# Patient Record
Sex: Female | Born: 1940 | Race: Black or African American | Hispanic: No | Marital: Married | Smoking: Never smoker
Health system: Southern US, Community
[De-identification: ages and names within clinical notes are randomized; demographics above are authoritative.]

## PROBLEM LIST (undated history)

## (undated) DIAGNOSIS — F419 Anxiety disorder, unspecified: Secondary | ICD-10-CM

## (undated) DIAGNOSIS — M25559 Pain in unspecified hip: Secondary | ICD-10-CM

## (undated) DIAGNOSIS — I872 Venous insufficiency (chronic) (peripheral): Secondary | ICD-10-CM

## (undated) DIAGNOSIS — G20 Parkinson's disease: Secondary | ICD-10-CM

## (undated) DIAGNOSIS — I739 Peripheral vascular disease, unspecified: Secondary | ICD-10-CM

## (undated) DIAGNOSIS — M1990 Unspecified osteoarthritis, unspecified site: Secondary | ICD-10-CM

## (undated) DIAGNOSIS — R9389 Abnormal findings on diagnostic imaging of other specified body structures: Secondary | ICD-10-CM

## (undated) DIAGNOSIS — E039 Hypothyroidism, unspecified: Secondary | ICD-10-CM

## (undated) DIAGNOSIS — M545 Low back pain, unspecified: Secondary | ICD-10-CM

## (undated) DIAGNOSIS — I10 Essential (primary) hypertension: Secondary | ICD-10-CM

## (undated) DIAGNOSIS — C50919 Malignant neoplasm of unspecified site of unspecified female breast: Secondary | ICD-10-CM

## (undated) DIAGNOSIS — M8580 Other specified disorders of bone density and structure, unspecified site: Secondary | ICD-10-CM

## (undated) DIAGNOSIS — E7800 Pure hypercholesterolemia, unspecified: Secondary | ICD-10-CM

## (undated) DIAGNOSIS — G20A1 Parkinson's disease without dyskinesia, without mention of fluctuations: Secondary | ICD-10-CM

## (undated) DIAGNOSIS — K5730 Diverticulosis of large intestine without perforation or abscess without bleeding: Secondary | ICD-10-CM

## (undated) HISTORY — DX: Essential (primary) hypertension: I10

## (undated) HISTORY — DX: Parkinson's disease: G20

## (undated) HISTORY — PX: POLYPECTOMY: SHX149

## (undated) HISTORY — DX: Hypothyroidism, unspecified: E03.9

## (undated) HISTORY — DX: Venous insufficiency (chronic) (peripheral): I87.2

## (undated) HISTORY — PX: OVARIAN CYST REMOVAL: SHX89

## (undated) HISTORY — DX: Other specified disorders of bone density and structure, unspecified site: M85.80

## (undated) HISTORY — DX: Pain in unspecified hip: M25.559

## (undated) HISTORY — DX: Unspecified osteoarthritis, unspecified site: M19.90

## (undated) HISTORY — PX: CHOLECYSTECTOMY: SHX55

## (undated) HISTORY — DX: Low back pain: M54.5

## (undated) HISTORY — DX: Anxiety disorder, unspecified: F41.9

## (undated) HISTORY — DX: Diverticulosis of large intestine without perforation or abscess without bleeding: K57.30

## (undated) HISTORY — DX: Peripheral vascular disease, unspecified: I73.9

## (undated) HISTORY — PX: COLONOSCOPY: SHX174

## (undated) HISTORY — DX: Low back pain, unspecified: M54.50

## (undated) HISTORY — DX: Parkinson's disease without dyskinesia, without mention of fluctuations: G20.A1

## (undated) HISTORY — DX: Abnormal findings on diagnostic imaging of other specified body structures: R93.89

## (undated) HISTORY — DX: Malignant neoplasm of unspecified site of unspecified female breast: C50.919

## (undated) HISTORY — DX: Pure hypercholesterolemia, unspecified: E78.00

## (undated) HISTORY — PX: BREAST LUMPECTOMY: SHX2

---

## 1997-10-11 ENCOUNTER — Other Ambulatory Visit: Admission: RE | Admit: 1997-10-11 | Discharge: 1997-10-11 | Payer: Self-pay | Admitting: Obstetrics & Gynecology

## 1997-11-15 ENCOUNTER — Ambulatory Visit (HOSPITAL_COMMUNITY): Admission: RE | Admit: 1997-11-15 | Discharge: 1997-11-15 | Payer: Self-pay | Admitting: Obstetrics & Gynecology

## 1998-02-03 ENCOUNTER — Other Ambulatory Visit: Admission: RE | Admit: 1998-02-03 | Discharge: 1998-02-03 | Payer: Self-pay | Admitting: Obstetrics & Gynecology

## 1998-12-06 ENCOUNTER — Other Ambulatory Visit: Admission: RE | Admit: 1998-12-06 | Discharge: 1998-12-06 | Payer: Self-pay | Admitting: Obstetrics and Gynecology

## 1998-12-09 ENCOUNTER — Ambulatory Visit (HOSPITAL_COMMUNITY): Admission: RE | Admit: 1998-12-09 | Discharge: 1998-12-09 | Payer: Self-pay | Admitting: Obstetrics and Gynecology

## 1998-12-09 ENCOUNTER — Encounter: Payer: Self-pay | Admitting: Obstetrics and Gynecology

## 1999-03-09 ENCOUNTER — Encounter: Payer: Self-pay | Admitting: Obstetrics & Gynecology

## 1999-03-09 ENCOUNTER — Ambulatory Visit (HOSPITAL_COMMUNITY): Admission: RE | Admit: 1999-03-09 | Discharge: 1999-03-09 | Payer: Self-pay | Admitting: Obstetrics & Gynecology

## 1999-03-16 ENCOUNTER — Encounter: Payer: Self-pay | Admitting: Family Medicine

## 1999-03-16 ENCOUNTER — Ambulatory Visit (HOSPITAL_COMMUNITY): Admission: RE | Admit: 1999-03-16 | Discharge: 1999-03-16 | Payer: Self-pay | Admitting: Family Medicine

## 1999-03-30 ENCOUNTER — Encounter: Payer: Self-pay | Admitting: Family Medicine

## 1999-03-30 ENCOUNTER — Ambulatory Visit (HOSPITAL_COMMUNITY): Admission: RE | Admit: 1999-03-30 | Discharge: 1999-03-30 | Payer: Self-pay | Admitting: Family Medicine

## 1999-04-13 ENCOUNTER — Encounter: Payer: Self-pay | Admitting: Family Medicine

## 1999-04-13 ENCOUNTER — Ambulatory Visit (HOSPITAL_COMMUNITY): Admission: RE | Admit: 1999-04-13 | Discharge: 1999-04-13 | Payer: Self-pay | Admitting: Family Medicine

## 1999-05-24 ENCOUNTER — Encounter: Admission: RE | Admit: 1999-05-24 | Discharge: 1999-05-24 | Payer: Self-pay | Admitting: Neurosurgery

## 1999-05-24 ENCOUNTER — Encounter: Payer: Self-pay | Admitting: Neurosurgery

## 1999-08-02 ENCOUNTER — Encounter: Admission: RE | Admit: 1999-08-02 | Discharge: 1999-09-25 | Payer: Self-pay | Admitting: Neurological Surgery

## 1999-10-04 ENCOUNTER — Encounter (INDEPENDENT_AMBULATORY_CARE_PROVIDER_SITE_OTHER): Payer: Self-pay | Admitting: Specialist

## 1999-10-04 ENCOUNTER — Other Ambulatory Visit: Admission: RE | Admit: 1999-10-04 | Discharge: 1999-10-04 | Payer: Self-pay | Admitting: Gastroenterology

## 2000-02-19 ENCOUNTER — Other Ambulatory Visit: Admission: RE | Admit: 2000-02-19 | Discharge: 2000-02-19 | Payer: Self-pay | Admitting: Obstetrics & Gynecology

## 2001-01-29 ENCOUNTER — Other Ambulatory Visit: Admission: RE | Admit: 2001-01-29 | Discharge: 2001-01-29 | Payer: Self-pay | Admitting: Obstetrics and Gynecology

## 2001-02-18 ENCOUNTER — Encounter: Admission: RE | Admit: 2001-02-18 | Discharge: 2001-03-10 | Payer: Self-pay | Admitting: Neurology

## 2002-01-29 ENCOUNTER — Other Ambulatory Visit: Admission: RE | Admit: 2002-01-29 | Discharge: 2002-01-29 | Payer: Self-pay | Admitting: Obstetrics and Gynecology

## 2002-11-30 ENCOUNTER — Encounter (HOSPITAL_COMMUNITY): Admission: RE | Admit: 2002-11-30 | Discharge: 2003-02-28 | Payer: Self-pay | Admitting: Internal Medicine

## 2002-12-01 ENCOUNTER — Encounter: Payer: Self-pay | Admitting: Internal Medicine

## 2003-02-16 ENCOUNTER — Ambulatory Visit (HOSPITAL_COMMUNITY): Admission: RE | Admit: 2003-02-16 | Discharge: 2003-02-16 | Payer: Self-pay | Admitting: Obstetrics and Gynecology

## 2003-02-16 ENCOUNTER — Encounter: Payer: Self-pay | Admitting: Obstetrics and Gynecology

## 2003-06-14 ENCOUNTER — Ambulatory Visit (HOSPITAL_COMMUNITY): Admission: RE | Admit: 2003-06-14 | Discharge: 2003-06-14 | Payer: Self-pay | Admitting: Pulmonary Disease

## 2003-09-24 HISTORY — PX: OTHER SURGICAL HISTORY: SHX169

## 2003-10-14 ENCOUNTER — Encounter (INDEPENDENT_AMBULATORY_CARE_PROVIDER_SITE_OTHER): Payer: Self-pay | Admitting: Specialist

## 2003-10-14 ENCOUNTER — Observation Stay (HOSPITAL_COMMUNITY): Admission: RE | Admit: 2003-10-14 | Discharge: 2003-10-15 | Payer: Self-pay | Admitting: General Surgery

## 2004-02-17 ENCOUNTER — Encounter (HOSPITAL_COMMUNITY): Admission: RE | Admit: 2004-02-17 | Discharge: 2004-05-17 | Payer: Self-pay | Admitting: Internal Medicine

## 2004-05-01 ENCOUNTER — Other Ambulatory Visit: Admission: RE | Admit: 2004-05-01 | Discharge: 2004-05-01 | Payer: Self-pay | Admitting: Obstetrics and Gynecology

## 2004-07-14 ENCOUNTER — Ambulatory Visit: Payer: Self-pay | Admitting: Pulmonary Disease

## 2004-09-06 ENCOUNTER — Ambulatory Visit: Payer: Self-pay | Admitting: Pulmonary Disease

## 2004-12-28 ENCOUNTER — Ambulatory Visit: Payer: Self-pay | Admitting: Pulmonary Disease

## 2005-01-05 ENCOUNTER — Ambulatory Visit: Payer: Self-pay | Admitting: Gastroenterology

## 2005-01-29 ENCOUNTER — Ambulatory Visit: Payer: Self-pay | Admitting: Gastroenterology

## 2005-04-18 ENCOUNTER — Ambulatory Visit: Payer: Self-pay | Admitting: Pulmonary Disease

## 2005-06-01 ENCOUNTER — Ambulatory Visit: Payer: Self-pay | Admitting: Pulmonary Disease

## 2005-08-02 ENCOUNTER — Other Ambulatory Visit: Admission: RE | Admit: 2005-08-02 | Discharge: 2005-08-02 | Payer: Self-pay | Admitting: Obstetrics and Gynecology

## 2005-09-26 ENCOUNTER — Encounter: Admission: RE | Admit: 2005-09-26 | Discharge: 2005-09-26 | Payer: Self-pay | Admitting: Orthopedic Surgery

## 2005-10-30 ENCOUNTER — Ambulatory Visit: Payer: Self-pay | Admitting: Pulmonary Disease

## 2005-11-23 HISTORY — PX: OTHER SURGICAL HISTORY: SHX169

## 2005-12-05 ENCOUNTER — Inpatient Hospital Stay (HOSPITAL_COMMUNITY): Admission: RE | Admit: 2005-12-05 | Discharge: 2005-12-07 | Payer: Self-pay | Admitting: Orthopedic Surgery

## 2006-05-02 ENCOUNTER — Ambulatory Visit: Payer: Self-pay | Admitting: Pulmonary Disease

## 2006-10-28 ENCOUNTER — Ambulatory Visit: Payer: Self-pay | Admitting: Pulmonary Disease

## 2006-10-28 LAB — CONVERTED CEMR LAB
ALT: 5 units/L (ref 0–40)
Albumin: 4.1 g/dL (ref 3.5–5.2)
Alkaline Phosphatase: 62 units/L (ref 39–117)
BUN: 15 mg/dL (ref 6–23)
Bilirubin, Direct: 0.2 mg/dL (ref 0.0–0.3)
Direct LDL: 129.6 mg/dL
Eosinophils Absolute: 0 10*3/uL (ref 0.0–0.6)
GFR calc Af Amer: 92 mL/min
GFR calc non Af Amer: 76 mL/min
HDL: 57.7 mg/dL (ref >39.0)
Hemoglobin: 13.2 g/dL (ref 12.0–15.0)
Hgb A1c MFr Bld: 5.7 % (ref 4.6–6.0)
MCHC: 34.1 g/dL (ref 30.0–36.0)
Monocytes Absolute: 0.2 10*3/uL (ref 0.2–0.7)
Neutro Abs: 1.9 10*3/uL (ref 1.4–7.7)
Potassium: 4.5 meq/L (ref 3.5–5.1)
RBC: 4.35 M/uL (ref 3.87–5.11)
RDW: 12.8 % (ref 11.5–14.6)
Sodium: 140 meq/L (ref 135–145)
Total Protein: 6.7 g/dL (ref 6.0–8.3)
Triglycerides: 66 mg/dL (ref 0–149)
WBC: 3.5 10*3/uL — ABNORMAL LOW (ref 4.5–10.5)

## 2006-12-19 ENCOUNTER — Encounter: Payer: Self-pay | Admitting: Pulmonary Disease

## 2007-04-09 ENCOUNTER — Ambulatory Visit: Payer: Self-pay | Admitting: Pulmonary Disease

## 2007-04-28 ENCOUNTER — Ambulatory Visit: Payer: Self-pay | Admitting: Pulmonary Disease

## 2007-05-01 DIAGNOSIS — E039 Hypothyroidism, unspecified: Secondary | ICD-10-CM | POA: Insufficient documentation

## 2007-05-01 DIAGNOSIS — I872 Venous insufficiency (chronic) (peripheral): Secondary | ICD-10-CM | POA: Insufficient documentation

## 2007-05-01 DIAGNOSIS — I10 Essential (primary) hypertension: Secondary | ICD-10-CM | POA: Insufficient documentation

## 2007-05-01 DIAGNOSIS — E118 Type 2 diabetes mellitus with unspecified complications: Secondary | ICD-10-CM | POA: Insufficient documentation

## 2007-05-01 DIAGNOSIS — M1990 Unspecified osteoarthritis, unspecified site: Secondary | ICD-10-CM | POA: Insufficient documentation

## 2007-05-01 DIAGNOSIS — I739 Peripheral vascular disease, unspecified: Secondary | ICD-10-CM | POA: Insufficient documentation

## 2007-10-27 ENCOUNTER — Ambulatory Visit: Payer: Self-pay | Admitting: Pulmonary Disease

## 2007-10-27 DIAGNOSIS — E7800 Pure hypercholesterolemia, unspecified: Secondary | ICD-10-CM

## 2007-11-03 LAB — CONVERTED CEMR LAB
ALT: 5 units/L (ref 0–35)
Albumin: 4.1 g/dL (ref 3.5–5.2)
Alkaline Phosphatase: 60 units/L (ref 39–117)
Basophils Absolute: 0 10*3/uL (ref 0.0–0.1)
Bilirubin, Direct: 0.1 mg/dL (ref 0.0–0.3)
CO2: 30 meq/L (ref 19–32)
Calcium: 9.3 mg/dL (ref 8.4–10.5)
Chloride: 104 meq/L (ref 96–112)
Creatinine, Ser: 0.9 mg/dL (ref 0.4–1.2)
GFR calc Af Amer: 80 mL/min
GFR calc non Af Amer: 66 mL/min
Glucose, Bld: 90 mg/dL (ref 70–99)
HDL: 52.6 mg/dL (ref >39.0)
MCHC: 34.1 g/dL (ref 30.0–36.0)
MCV: 90.6 fL (ref 78.0–100.0)
Monocytes Absolute: 0.1 10*3/uL (ref 0.1–1.0)
Neutro Abs: 1.9 10*3/uL (ref 1.4–7.7)
Platelets: 191 10*3/uL (ref 150–400)
Potassium: 4.4 meq/L (ref 3.5–5.1)
Total CHOL/HDL Ratio: 3.7
Total Protein: 6.7 g/dL (ref 6.0–8.3)
Triglycerides: 74 mg/dL (ref 0–149)
VLDL: 15 mg/dL (ref 0–40)
WBC: 3.7 10*3/uL — ABNORMAL LOW (ref 4.5–10.5)

## 2007-11-09 DIAGNOSIS — K5730 Diverticulosis of large intestine without perforation or abscess without bleeding: Secondary | ICD-10-CM | POA: Insufficient documentation

## 2007-11-09 DIAGNOSIS — M545 Low back pain: Secondary | ICD-10-CM

## 2007-11-09 DIAGNOSIS — R930 Abnormal findings on diagnostic imaging of skull and head, not elsewhere classified: Secondary | ICD-10-CM

## 2007-11-09 DIAGNOSIS — H409 Unspecified glaucoma: Secondary | ICD-10-CM | POA: Insufficient documentation

## 2007-11-09 DIAGNOSIS — F411 Generalized anxiety disorder: Secondary | ICD-10-CM | POA: Insufficient documentation

## 2008-01-19 ENCOUNTER — Ambulatory Visit: Payer: Self-pay | Admitting: Internal Medicine

## 2008-02-20 ENCOUNTER — Encounter: Payer: Self-pay | Admitting: Pulmonary Disease

## 2008-03-31 ENCOUNTER — Ambulatory Visit: Payer: Self-pay | Admitting: Pulmonary Disease

## 2008-04-26 ENCOUNTER — Ambulatory Visit: Payer: Self-pay | Admitting: Pulmonary Disease

## 2008-04-26 DIAGNOSIS — E559 Vitamin D deficiency, unspecified: Secondary | ICD-10-CM

## 2008-05-02 DIAGNOSIS — M899 Disorder of bone, unspecified: Secondary | ICD-10-CM | POA: Insufficient documentation

## 2008-05-02 DIAGNOSIS — M949 Disorder of cartilage, unspecified: Secondary | ICD-10-CM

## 2008-05-02 LAB — CONVERTED CEMR LAB: Vit D, 1,25-Dihydroxy: 47 (ref 30–89)

## 2008-06-10 ENCOUNTER — Telehealth (INDEPENDENT_AMBULATORY_CARE_PROVIDER_SITE_OTHER): Payer: Self-pay | Admitting: *Deleted

## 2008-10-28 ENCOUNTER — Ambulatory Visit: Payer: Self-pay | Admitting: Internal Medicine

## 2008-11-01 LAB — CONVERTED CEMR LAB
AST: 21 units/L (ref 0–37)
Albumin: 4.5 g/dL (ref 3.5–5.2)
Alkaline Phosphatase: 70 units/L (ref 39–117)
Basophils Absolute: 0 10*3/uL (ref 0.0–0.1)
Basophils Relative: 0.5 % (ref 0.0–3.0)
Calcium: 9.3 mg/dL (ref 8.4–10.5)
Chloride: 104 meq/L (ref 96–112)
Cholesterol: 218 mg/dL — ABNORMAL HIGH (ref 0–200)
Creatinine, Ser: 0.9 mg/dL (ref 0.4–1.2)
Eosinophils Absolute: 0 10*3/uL (ref 0.0–0.7)
GFR calc non Af Amer: 79.6 mL/min (ref >60)
Glucose, Bld: 92 mg/dL (ref 70–99)
HDL: 62.1 mg/dL (ref >39.00)
Hemoglobin: 13.8 g/dL (ref 12.0–15.0)
Hgb A1c MFr Bld: 5.7 % (ref 4.6–6.5)
Lymphocytes Relative: 22.5 % (ref 12.0–46.0)
Lymphs Abs: 0.9 10*3/uL (ref 0.7–4.0)
MCV: 93.2 fL (ref 78.0–100.0)
Neutro Abs: 3 10*3/uL (ref 1.4–7.7)
RBC: 4.31 M/uL (ref 3.87–5.11)
Total Bilirubin: 1.2 mg/dL (ref 0.3–1.2)
Total CHOL/HDL Ratio: 4
Total Protein: 7.1 g/dL (ref 6.0–8.3)
VLDL: 12 mg/dL (ref 0.0–40.0)
WBC: 4 10*3/uL — ABNORMAL LOW (ref 4.5–10.5)

## 2008-12-08 ENCOUNTER — Encounter: Payer: Self-pay | Admitting: Pulmonary Disease

## 2009-03-18 ENCOUNTER — Ambulatory Visit: Payer: Self-pay | Admitting: Pulmonary Disease

## 2009-06-02 ENCOUNTER — Ambulatory Visit: Payer: Self-pay | Admitting: Pulmonary Disease

## 2009-06-02 DIAGNOSIS — M25559 Pain in unspecified hip: Secondary | ICD-10-CM

## 2009-10-03 ENCOUNTER — Encounter: Payer: Self-pay | Admitting: Pulmonary Disease

## 2009-11-18 ENCOUNTER — Ambulatory Visit: Payer: Self-pay | Admitting: Pulmonary Disease

## 2009-11-19 LAB — CONVERTED CEMR LAB
ALT: 4 units/L (ref 0–35)
Albumin: 4.5 g/dL (ref 3.5–5.2)
BUN: 18 mg/dL (ref 6–23)
Basophils Relative: 1.2 % (ref 0.0–3.0)
Bilirubin, Direct: 0.2 mg/dL (ref 0.0–0.3)
Calcium: 9.6 mg/dL (ref 8.4–10.5)
Cholesterol: 193 mg/dL (ref 0–200)
Creatinine, Ser: 0.8 mg/dL (ref 0.4–1.2)
Eosinophils Absolute: 0 10*3/uL (ref 0.0–0.7)
Eosinophils Relative: 0.9 % (ref 0.0–5.0)
GFR calc non Af Amer: 92.24 mL/min (ref >60)
HDL: 63 mg/dL (ref >39.00)
Hemoglobin: 14 g/dL (ref 12.0–15.0)
Lymphocytes Relative: 38.1 % (ref 12.0–46.0)
Lymphs Abs: 1.3 10*3/uL (ref 0.7–4.0)
MCHC: 34.5 g/dL (ref 30.0–36.0)
Monocytes Absolute: 0.1 10*3/uL (ref 0.1–1.0)
Monocytes Relative: 4.3 % (ref 3.0–12.0)
Neutrophils Relative %: 55.5 % (ref 43.0–77.0)
Sodium: 145 meq/L (ref 135–145)
TSH: 0.28 microintl units/mL — ABNORMAL LOW (ref 0.35–5.50)
Total Bilirubin: 1.1 mg/dL (ref 0.3–1.2)
Total CHOL/HDL Ratio: 3

## 2009-11-24 ENCOUNTER — Telehealth (INDEPENDENT_AMBULATORY_CARE_PROVIDER_SITE_OTHER): Payer: Self-pay | Admitting: *Deleted

## 2009-12-06 ENCOUNTER — Telehealth (INDEPENDENT_AMBULATORY_CARE_PROVIDER_SITE_OTHER): Payer: Self-pay | Admitting: *Deleted

## 2009-12-07 ENCOUNTER — Encounter: Payer: Self-pay | Admitting: Pulmonary Disease

## 2009-12-09 ENCOUNTER — Encounter: Payer: Self-pay | Admitting: Pulmonary Disease

## 2009-12-29 ENCOUNTER — Encounter (INDEPENDENT_AMBULATORY_CARE_PROVIDER_SITE_OTHER): Payer: Self-pay | Admitting: *Deleted

## 2010-01-03 ENCOUNTER — Encounter: Payer: Self-pay | Admitting: Pulmonary Disease

## 2010-01-09 ENCOUNTER — Encounter: Admission: RE | Admit: 2010-01-09 | Discharge: 2010-01-09 | Payer: Self-pay | Admitting: Radiology

## 2010-01-11 ENCOUNTER — Encounter: Admission: RE | Admit: 2010-01-11 | Discharge: 2010-01-11 | Payer: Self-pay | Admitting: Radiology

## 2010-01-12 ENCOUNTER — Encounter: Payer: Self-pay | Admitting: Pulmonary Disease

## 2010-01-17 ENCOUNTER — Telehealth (INDEPENDENT_AMBULATORY_CARE_PROVIDER_SITE_OTHER): Payer: Self-pay | Admitting: *Deleted

## 2010-01-18 ENCOUNTER — Ambulatory Visit: Payer: Self-pay | Admitting: Internal Medicine

## 2010-02-08 ENCOUNTER — Encounter (INDEPENDENT_AMBULATORY_CARE_PROVIDER_SITE_OTHER): Payer: Self-pay | Admitting: General Surgery

## 2010-02-08 ENCOUNTER — Ambulatory Visit (HOSPITAL_COMMUNITY): Admission: RE | Admit: 2010-02-08 | Discharge: 2010-02-09 | Payer: Self-pay | Admitting: General Surgery

## 2010-02-16 ENCOUNTER — Ambulatory Visit: Payer: Self-pay | Admitting: Oncology

## 2010-02-20 ENCOUNTER — Encounter: Payer: Self-pay | Admitting: Pulmonary Disease

## 2010-02-21 ENCOUNTER — Ambulatory Visit: Admission: RE | Admit: 2010-02-21 | Discharge: 2010-04-28 | Payer: Self-pay | Admitting: Radiation Oncology

## 2010-02-21 ENCOUNTER — Encounter: Payer: Self-pay | Admitting: Pulmonary Disease

## 2010-03-02 ENCOUNTER — Encounter: Payer: Self-pay | Admitting: Pulmonary Disease

## 2010-04-20 ENCOUNTER — Telehealth (INDEPENDENT_AMBULATORY_CARE_PROVIDER_SITE_OTHER): Payer: Self-pay | Admitting: *Deleted

## 2010-04-21 ENCOUNTER — Ambulatory Visit: Payer: Self-pay | Admitting: Pulmonary Disease

## 2010-04-25 HISTORY — PX: OTHER SURGICAL HISTORY: SHX169

## 2010-05-04 ENCOUNTER — Ambulatory Visit: Payer: Self-pay | Admitting: Oncology

## 2010-05-08 ENCOUNTER — Encounter: Payer: Self-pay | Admitting: Pulmonary Disease

## 2010-05-08 LAB — CBC WITH DIFFERENTIAL/PLATELET
Basophils Absolute: 0 10*3/uL (ref 0.0–0.1)
EOS%: 1.1 % (ref 0.0–7.0)
Eosinophils Absolute: 0 10*3/uL (ref 0.0–0.5)
HGB: 13 g/dL (ref 11.6–15.9)
MCHC: 33.5 g/dL (ref 31.5–36.0)
NEUT#: 2.4 10*3/uL (ref 1.5–6.5)
NEUT%: 72.7 % (ref 38.4–76.8)
Platelets: 192 10*3/uL (ref 145–400)
RBC: 4.19 10*6/uL (ref 3.70–5.45)
WBC: 3.3 10*3/uL — ABNORMAL LOW (ref 3.9–10.3)

## 2010-05-08 LAB — COMPREHENSIVE METABOLIC PANEL
Albumin: 4 g/dL (ref 3.5–5.2)
CO2: 30 mEq/L (ref 19–32)
Calcium: 9.6 mg/dL (ref 8.4–10.5)
Chloride: 105 mEq/L (ref 96–112)
Creatinine, Ser: 0.89 mg/dL (ref 0.40–1.20)
Glucose, Bld: 110 mg/dL — ABNORMAL HIGH (ref 70–99)
Sodium: 141 mEq/L (ref 135–145)
Total Bilirubin: 0.6 mg/dL (ref 0.3–1.2)

## 2010-05-10 ENCOUNTER — Ambulatory Visit: Payer: Self-pay | Admitting: Pulmonary Disease

## 2010-05-22 DIAGNOSIS — C50912 Malignant neoplasm of unspecified site of left female breast: Secondary | ICD-10-CM

## 2010-05-25 ENCOUNTER — Telehealth (INDEPENDENT_AMBULATORY_CARE_PROVIDER_SITE_OTHER): Payer: Self-pay | Admitting: *Deleted

## 2010-05-26 ENCOUNTER — Encounter: Payer: Self-pay | Admitting: Pulmonary Disease

## 2010-07-16 ENCOUNTER — Encounter: Payer: Self-pay | Admitting: Internal Medicine

## 2010-07-27 NOTE — Letter (Signed)
Summary: [facility]  [facility]   Imported By: Sherian Rein 06/09/2010 14:03:08  _____________________________________________________________________  External Attachment:    Type:   Image     Comment:   External Document

## 2010-07-27 NOTE — Progress Notes (Signed)
Summary: surgery clearance  Phone Note From Other Clinic   Caller: ALICIA W/ DR [facility] Call For: NADEL Summary of Call: caller states that she faxed a request for surgery clearance 7/15. hasn't received anything back. pt has breast cancer and surgery cannot be done without this. caller wants to know if another dr can approve this for pt? call alicia at [phone]. fax # is [phone]. Helmut Muster is re-faxing this request now (to triage fax).  Initial call taken by: Tivis Ringer, CNA,  January 17, 2010 9:11 AM  Follow-up for Phone Call        SN out of office this week and TP does not do surgical clearance. WIill have to schedule pt an appt with MW to called and spoke with nurse from [facility] and informed her of the above information and that pt is scheduled to see MW tomorrow at 8:45am.  Called and spoke with pt as well and informed her of the information and of the appt date and time.  Kristin Millet Reynolds LPN  January 17, 2010 9:34 AM

## 2010-07-27 NOTE — Letter (Signed)
Summary: [facility]  [facility]   Imported By: Sherian Rein 05/16/2010 14:34:26  _____________________________________________________________________  External Attachment:    Type:   Image     Comment:   External Document

## 2010-07-27 NOTE — Letter (Signed)
Summary: [facility]  [facility]   Imported By: Lennie Odor 03/22/2010 11:02:48  _____________________________________________________________________  External Attachment:    Type:   Image     Comment:   External Document

## 2010-07-27 NOTE — Progress Notes (Signed)
Summary: labs  Phone Note Call from Patient Call back at Home Phone [phone]   Caller: Patient Call For: nadel Summary of Call: pt would like a copy of her last labs mailed to hr. Initial call taken by: Eugene Gavia,  December 06, 2009 2:57 PM  Follow-up for Phone Call        labs mailed 6/14.//[name] Follow-up by: Darletta Moll,  December 06, 2009 4:30 PM

## 2010-07-27 NOTE — Progress Notes (Signed)
Summary: test results on phone tree  Phone Note Call from Patient Call back at Home Phone [phone]   Caller: Patient Call For: nadel Summary of Call: please call with results Initial call taken by: Lacinda Axon,  May 25, 2010 11:24 AM  Follow-up for Phone Call        Pt says she has tried to call the phone tree for several days but there is no msg. I tried this for her and Sn has now left a msg for her regarding results. She will call phone tree again and i verified. she had the correct phone number and medical rec number. She wil callback if she has any further questions or problems. Follow-up by: Michel Bickers CMA,  May 25, 2010 11:48 AM

## 2010-07-27 NOTE — Letter (Signed)
Summary: [facility]  [facility]   Imported By: Lennie Odor 03/09/2010 15:14:14  _____________________________________________________________________  External Attachment:    Type:   Image     Comment:   External Document

## 2010-07-27 NOTE — Progress Notes (Signed)
Summary: flu shot  Phone Note Call from Patient Call back at Home Phone [phone]   Caller: Patient Call For: nadel Summary of Call: pt wants to know when she can get a flu shot. tammy scott told me today that they may not had enough coverage for pt's who are coming in fri. pls advise. thanks. kp Initial call taken by: Tivis Ringer, CNA,  April 20, 2010 3:07 PM  Follow-up for Phone Call        Advised pt that we should have more fluvax by Monday.  I transferred her back to schedulers to make her appt for this. Follow-up by: Vernie Murders,  April 20, 2010 3:41 PM

## 2010-07-27 NOTE — Assessment & Plan Note (Signed)
Summary: flu shot/ mbw   Nurse Visit   Allergies: No Known Drug Allergies  Orders Added: 1)  Flu Vaccine 71yrs + MEDICARE PATIENTS [Q2039] 2)  Administration Flu vaccine - MCR [G0008]  Flu Vaccine Consent Questions     Do you have a history of severe allergic reactions to this vaccine? no    Any prior history of allergic reactions to egg and/or gelatin? no    Do you have a sensitivity to the preservative Thimersol? no    Do you have a past history of Guillan-Barre Syndrome? no    Do you currently have an acute febrile illness? no    Have you ever had a severe reaction to latex? no    Vaccine information given and explained to patient? yes    Are you currently pregnant? no    Lot Number:[id number]   Exp Date:12/23/2010   Site Given  Left Deltoid IM Clarise Cruz (AAMA)  April 21, 2010 10:03 AM

## 2010-07-27 NOTE — Assessment & Plan Note (Signed)
Summary: Pulmonary consultation/ pre op eval   Primary Provider/Referring Provider:  Dr. 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  Pt states doing well and denies any complaints today.Marland Kitchen  History of Present Illness: 70  year old AAF never smoker  with  history of DJD,  s/p lumbar laminectomy 11/2005 by DrGioffre for spinal stenosis, bordeline DM , HTN, and Hyperlipidemia.    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  Pt states doing well and denies any complaints today. had doing been treadmill 3 x weekly until a month ago "got too busy" but was able to do 30 min slt elevation slow and no decline in activity tolerance since.  no trouble sleeping.   Pt on asa/plavix as preventive only.  Current Medications (verified): 1)  Murine Tears For Dry Eyes 5-6 Mg/ml Soln (Polyvinyl Alcohol-Povidone) .... Use As Directed 2)  Adult Aspirin Ec Low Strength 81 Mg  Tbec (Aspirin) .... Take 1 Tablet By Mouth Once A Day 3)  Plavix 75 Mg  Tabs (Clopidogrel Bisulfate) .... Take 1 Tablet By Mouth Once A Day 4)  Lisinopril 10 Mg  Tabs (Lisinopril) .... Take 1 Tablet By Mouth Once A Day 5)  Levothyroxine Sodium 75 Mcg Tabs (Levothyroxine Sodium) .... Take 1 Tab By Mouth Once Daily.Marland KitchenMarland Kitchen 6)  Stalevo 150 37.5-150-200 Mg Tabs (Carbidopa-Levodopa-Entacapone) .... Take One Tablet By Mouth 5 Times Daily As Directed By Dr. Misty Stanley 7)  Tramadol Hcl 50 Mg Tabs (Tramadol Hcl) .... Take 1 Tab By Mouth Three Times A Day With Food As Needed For Pain... 8)  Caltrate 600+d 600-400 Mg-Unit  Tabs (Calcium Carbonate-Vitamin D) .... Take 1 Tab By Mouth Two Times A Day... 9)  Womens Multivitamin Plus  Tabs (Multiple Vitamins-Minerals) .... Take 1 Tab Daily... 10)  Vitamin B Complex  Tabs (B Complex Vitamins) .... Take 1 Tab Daily... 11)  Vitamin D 2000 Unit Tabs (Cholecalciferol) .... 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 mother deceased age 82 from stroke  hx of HBP/heart disease father deceased age 31 from colon cancer 6 brothers--1 deceased of liver/pancreatic cancer 5 brothers alive and well  1 with HBP  Social History: Reviewed history from 11/18/2009 and no changes required. married retired 2 children in good health never smoked no etoh  Vital Signs:  Patient profile:   70 year old female Weight:      128 pounds O2 Sat:      99 % on Room air Temp:     97.7 degrees F oral Pulse rate:   60 / minute BP sitting:   112 / 70  (right arm)  Vitals Entered By: Vernie Murders (January 18, 2010 8:43 AM)  O2 Flow:  Room air  Physical Exam  Additional Exam:  ambulatory eldlery BF a bit slow moving and mild parkinsonian tremor with poor voice texture / voice fatigue GENERAL:  Alert & oriented; pleasant & cooperative... HEENT:  [place]/AT, EOM-wnl,   EACs-clear, TMs-wnl, NOSE-clear, THROAT-clear & wnl. NECK:  Supple w/ fairROM; no JVD; normal carotid impulses w/o bruits; no thyromegaly or nodules palpated; no lymphadenopathy. CHEST:  Clear to P & A; without wheezes/ rales/ or rhonchi. HEART:  Regular Rhythm; without murmurs/ rubs/ or gallops. ABDOMEN:  Soft & nontender; normal bowel sounds; no organomegaly or masses detected. EXT: without deformities, mod arthritic changes; no varicose veins/ +venous insuffic/  no edema. NEURO:  CN's intact;  tremor and bradykinesia of Parkinson's; no focal neuro deficits  DERM:  No lesions noted; no rash     Impression & Recommendations:  Problem # 1:  HYPERTENSION (ICD-401.9)  Her updated medication list for this problem includes:    Lisinopril 10 Mg Tabs (Lisinopril) ..... Hold until after surgery and take benicar 20 mg one half daily in its place  Due to voice fatigue which may be either parkinson's or a mild ace effect will ask her to hold ace until post op as this will lessen likelihood of upper airways dysfunction related to et placement for surgery  Orders: New Patient Level IV (21308)  Problem # 2:  PERIPHERAL VASCULAR DISEASE (ICD-443.9)  Her updated medication list for this problem includes:    Plavix 75 Mg Tabs (Clopidogrel bisulfate) .Marland Kitchen... Take 1 tablet by mouth once a day  Ok to hold plavix and asa x 5 days as no active symptoms of concern  See instructions for specific recommendations   Orders: New Patient Level IV (65784)  Problem # 3:  ABNORMAL CHEST XRAY (ICD-793.1) she has chr right hemidiaphragm elevation & no changes serially... last CXR 5/09 w/ elev right hemidiaph, no change.  ~  CXR 5/11 showed elev right hemidiaph & scarring right base  Not a contraindication for breast sugery,  rec optimize mobilization post op  Medications Added to Medication List This Visit: 1)  Lisinopril 10 Mg Tabs (Lisinopril) .... Hold until after surgery and take benicar 20 mg one half daily in its place  Patient Instructions: 1)  You are cleared medically for surgery 2)  ok to stop the plavix and aspirin 5 days before surgery 3)  Stop lisinopril until after surgery 4)  In its place take benicar 20 mg one half daily 5)  Copy sent to: Dwain Sarna

## 2010-07-27 NOTE — Assessment & Plan Note (Signed)
Summary: 6 months/cb   Primary Care Provider:  Dr. Alroy Dust  CC:  6 month ROV & review of mult medical issues....  History of Present Illness: 70 y/o BF here for a follow up visit... she has mult med problems as noted below...    ~  Nov 18, 2009:  she's had several eye probs treated by DrHecker (glaucoma, cats, dry eyes) & had laser Rx...  BP has been controlled & feeling well x for minor somatic complaints... wants FASTING blood work, and referral to Scientist, research (medical) as she doesn't want to make the trip to [facility] to see DrStacey for f/u Parkinson's dis...   ~  May 10, 2010:  she saw DrPenumalli for Neuro> Parkinson's dis & Rx w/ Stalevo 150- 5times daily (hx hallucinations on the 200 tabs), no changes made... 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     Current Problem List:  GLAUCOMA (ICD-365.9) - on eye drops daily DrHecker and she reports cataract surg 2010, and Laser Rx 2011> followed for Glaucoma, s/p cats, dry eyes...  ~  6/10:  eye exam by DrHecker neg for retinopathy or macular changes...  ~  4/11: Argon laser trabeculoplasty planned by DrHecker.  ABNORMAL CHEST XRAY (ICD-793.1) - she has chr right hemidiaphragm elevation & no changes serially...   ~  CXR 5/09 w/ elev right hemidiaph, no change.  ~  CXR 5/11 showed elev right hemidiaph & scarring right base, NAD...  HYPERTENSION (ICD-401.9) - on LISINOPRIL 10mg /d w/ good control... BP= 120/82 today, feeling well and denies HA, fatigue, visual changes, CP, palipit, dizziness, syncope, dyspnea, edema, etc...  PERIPHERAL VASCULAR DISEASE (ICD-443.9) - on ASA 81mg /d & PLAVIX 75mg /d... she is asymptomatic w/o focal weakness, sensory changes, slurring, etc...  VENOUS INSUFFICIENCY (ICD-459.81) - on low sodium diet, etc... denies swelling...  HYPERCHOLESTEROLEMIA (ICD-272.0) - on  diet rx alone...  ~  FLP 5/08 showed TChol 217, TG 66, HDL 58, LDL 130...  ~  FLP 5/09 showed TChol 194, TG 74, HDL 53, LDL 127... she prefers diet alone...  ~  FLP 5/10 showed TChol 218, TG 60, HDL 62, LDL 142... still prefers diet + FishOil rec.  ~  FLP 5/11 showed TChol 193, TG 49, HDL 63, LDL 120... improved, continue diet, get wt down.  DIABETES MELLITUS, BORDERLINE (ICD-790.29) - on diet rx alone "I'm doing better" weight= 126# = stable...  ~  labs 5/08 showed BS=99, HGA1c=5.9  ~  labs 5/09 showed BS= 90, HgA1c= 5.9  ~  labs 5/10 showed BS= 92, A1c= 5.7  ~  labs 5/11 showed BS= 92  HYPOTHYROIDISM (ICD-244.9) - now on SYNTHROID 18mcg/d... Hx HYPERTHYROIDISM w/ I-131 rx 9/05...   ~  labs 5/08 on Synthroid 70mcg/d showed TSH= 0.73... keep same.  ~  labs 5/09 on Synth88 showed TSH= 0.72  ~  labs 5/10 on Synth88 showed TSH= 0.49  ~  labs 5/11 on Synth88 showed TSH= 0.28... rec> decr dose 54mcg/d (she is concerned about "facial hair")  ~  labs 11/11 on Synthroid 103mcg/d showed TSH= 3.92... keep same.  DIVERTICULOSIS OF COLON (ICD-562.10) - there is a +fam hx of colon cancer in her father...  ~  last colonoscopy 8/06 by DrPatterson showed divertics, hems... f/u 18yrs.  ~  5/11:  we will send reminder to GI re: colonoscopy due 8/11...  BREAST CANCER (ICD-174.9) ** SEE  ABOVE **  DEGENERATIVE JOINT DISEASE (ICD-715.90) & HIP PAIN (ICD-719.45) - she takes calcium & vitamins (including "Nature's Code which has 1,200 u Vit D)... prev on Celebrex & Tramadol but uses OTC meds now as needed... she tells me she had a BMD at DrBertrand[facility] in 2008 per her GYN.  ~  12/10: presents w/ right hip pain> XRays neg; Rx w/ Dosepak, Mobic, rest, heat...  ~  11/11:  c/o left hip pain & XRay showed mild degen changes> Rx MOBIC 7.5mg  Prn...  BACK PAIN, LUMBAR (ICD-724.2) - she is s/p lumbar laminectomy 6/07 by DrGioffre for spinal stenosis...  OSTEOPENIA (ICD-733.90) - she had a normal BMD in 2000- ?when her  last BMD was done?... Vit D level was 24 May09 and started on 50,000 u weekly... f/u Vit D level 11/09 = 47 & switched to OTC Vit D supplement.  ~  labs 5/11 showed Vit D level = 29... rec incr VIt D to 2000 u daily.  PARKINSON'S DISEASE (ICD-332.0) - she has mod severe Parkinson's dis followed by DrPenumalli & DrStacey at [facility] on STELEVO 150mg - 5 times daily... she has been doing well & very stable on this medication...  ~  5/11: she requests referral to [facility] for f/u Parkinson's> seen by DrPenumalli & no changes made.  ANXIETY (ICD-300.00) - prev on Prn Valium but off this medication now...   Preventive Screening-Counseling & Management  Alcohol-Tobacco     Smoking Status: never  Allergies (verified): No Known Drug Allergies  Comments:  Nurse/Medical Assistant: The patient's medications and allergies were reviewed with the patient and were updated in the Medication and Allergy Lists.  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) HYPOTHYROIDISM (ICD-244.9) DIVERTICULOSIS OF COLON (ICD-562.10) BREAST CANCER (ICD-174.9) DEGENERATIVE JOINT DISEASE (ICD-715.90) HIP PAIN (ICD-719.45) BACK PAIN, LUMBAR (ICD-724.2) OSTEOPENIA (ICD-733.90) VITAMIN D DEFICIENCY (ICD-268.9) PARKINSON'S DISEASE (ICD-332.0) ANXIETY (ICD-300.00)  Past Surgical History: S/P ovarian cystectomy S/P laparoscopic cholecystectomy 4/05 by Lurline Hare S/P lumbar laminectomy & foraminotomies for spinal stenosis 6/07 by DrGioffre S/P cataract surg & argon laser trabeculoplasty by DrHecker 2010-11  Family History: Reviewed history from 11/18/2009 and no changes required. mother deceased age 80 from stroke  hx of HBP/heart disease father deceased age 20 from colon cancer 6  brothers--1 deceased of liver/pancreatic cancer 5 brothers alive and well  1 with HBP  Social History: Reviewed history from 11/18/2009 and no changes required. married retired 2 children in good health never smoked no alcohol  Review of Systems      See HPI       The patient complains of dyspnea on exertion, muscle weakness, and difficulty walking.  The patient denies anorexia, fever, weight loss, weight gain, vision loss, decreased hearing, hoarseness, chest pain, syncope, peripheral edema, prolonged cough, headaches, hemoptysis, abdominal pain, melena, hematochezia, severe indigestion/heartburn, hematuria, incontinence, suspicious skin lesions, transient blindness, depression, unusual weight change, abnormal bleeding, enlarged lymph nodes, and angioedema.    Vital Signs:  Patient profile:   70 year old female Height:      60 inches Weight:      130.38 pounds BMI:     25.56 O2 Sat:      99 % on Room air Temp:     97.6 degrees F oral Pulse rate:   63 / minute BP sitting:   120 / 82  (left arm) Cuff size:   regular  Vitals  Entered By: Randell Loop CMA (May 10, 2010 10:11 AM)  O2 Sat at Rest %:  99 O2 Flow:  Room air CC: 6 month ROV & review of mult medical issues... Is Patient Diabetic? No Pain Assessment Patient in pain? yes      Onset of pain  left hip pain x 1 week Comments meds updated today with pt   Physical Exam  Additional Exam:  WD, WN, 70 y/o BF in NAD.Marland KitchenMarland Kitchen slow moving but only min tremor from Parkinsons... GENERAL:  Alert & oriented; pleasant & cooperative... HEENT:  [place]/AT, EOM-wnl, PERRLA, EACs-clear, TMs-wnl, NOSE-clear, THROAT-clear & wnl. NECK:  Supple w/ fairROM; no JVD; normal carotid impulses w/o bruits; no thyromegaly or nodules palpated; no lymphadenopathy. CHEST:  Clear to P & A; without wheezes/ rales/ or rhonchi. HEART:  Regular Rhythm; without murmurs/ rubs/ or gallops. ABDOMEN:  Soft & nontender; normal bowel sounds; no organomegaly or  masses detected. EXT: without deformities, mod arthritic changes; no varicose veins/ +venous insuffic/ no edema. fairly good ROM in hips etc... NEURO:  CN's intact; min tremor and bradykinesia of Parkinson's; no focal neuro deficits... DERM:  No lesions noted; no rash etc...    MISC. Report  Procedure date:  05/10/2010  Findings:      DATA REVIWED:  ~  Neuro eval by DrPenumalli 12/07/09...  ~  Breast surgby DrWakefield 8/11 & f/u notes...  ~  Oncology eval DrKhan 02/20/10...   Impression & Recommendations:  Problem # 1:  BREAST CANCER (ICD-174.9) See eval from DrWakefield, Andi Hence, DrMoody...  Problem # 2:  ABNORMAL CHEST XRAY (ICD-793.1) No change in elev right hemidiaph & scarring rioght base...  Problem # 3:  HYPERTENSION (ICD-401.9) Controlled>  same med. Her updated medication list for this problem includes:    Lisinopril 10 Mg Tabs (Lisinopril) .Marland Kitchen... Take 1 tablet by mouth once a day  Problem # 4:  PERIPHERAL VASCULAR DISEASE (ICD-443.9) On ASA & Plavix & stabler>  continue same.  Problem # 5:  HYPERCHOLESTEROLEMIA (ICD-272.0) She prefers diet alone...  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  Problem # 7:  DEGENERATIVE JOINT DISEASE (ICD-715.90) Rx written for Mobic... Her updated medication list for this problem includes:    Adult Aspirin Ec Low Strength 81 Mg Tbec (Aspirin) .Marland Kitchen... Take 1 tablet by mouth once a day    Meloxicam 7.5 Mg Tabs (Meloxicam) .Marland Kitchen... Take 1 tab by mouth up to 2 times daily as needed for pain...  Problem # 8:  PARKINSON'S DISEASE (ICD-332.0) She is followed by DrPenumalli now... continue Stavelo...  Problem # 9:  OTHER MEDICAL PROBLEMS AS NOTED>>>  Complete Medication List: 1)  Restasis 0.05 % Emul (Cyclosporine) .... Use in each eye two times a day 2)  Adult Aspirin Ec Low Strength 81 Mg Tbec (Aspirin) .... Take 1 tablet by mouth once a day 3)  Plavix 75 Mg Tabs (Clopidogrel bisulfate) ....  Take 1 tablet by mouth once a day 4)  Lisinopril 10 Mg Tabs (Lisinopril) .... Take 1 tablet by mouth once a day 5)  Levothyroxine Sodium 75 Mcg Tabs (Levothyroxine sodium) .... Take 1 tab by mouth once daily.Marland KitchenMarland Kitchen 6)  Stalevo 150 37.5-150-200 Mg Tabs (Carbidopa-levodopa-entacapone) .... Take one tablet by mouth 5 times daily as directed by dr. Misty Stanley 7)  Meloxicam 7.5 Mg Tabs (Meloxicam) .... Take 1 tab by mouth up to 2 times daily as needed for pain.Marland KitchenMarland Kitchen 8)  Caltrate 600+d 600-400 Mg-unit Tabs (Calcium carbonate-vitamin d) .... Take 1  tab by mouth two times a day... 9)  Womens Multivitamin Plus Tabs (Multiple vitamins-minerals) .... Take 1 tab daily... 10)  Vitamin D3 2000 Unit Caps (Cholecalciferol) .... Take 1 cap by mouth once daily...  Other Orders: T-Hip Comp Left Min 2-views (73510TC) TLB-TSH (Thyroid Stimulating Hormone) [phone])  Patient Instructions: 1)  Today we updated your med list- see below.... 2)  We refilled your MELOXICAM (MOBIC) to take up to 1-2 daily as needed for pain.Marland KitchenMarland Kitchen 3)  Don't forget your Vit D supplement as well> 2000 units daily. 4)  Today we did a left hip XRay & rechecked your thyroid blood test... 5)  please call the "phone tree" in a few days for your lab results.Marland KitchenMarland Kitchen 6)  Call for any problems.Marland KitchenMarland Kitchen 7)  Please schedule a follow-up appointment in 6 months & we will do your FASTING labs at that time... Prescriptions: MELOXICAM 7.5 MG TABS (MELOXICAM) take 1 tab by mouth up to 2 times daily as needed for pain...  #50 x 6   Entered and Authorized by:   Michele Mcalpine MD   Signed by:   Michele Mcalpine MD on 05/10/2010   Method used:   Print then Give to Patient   RxID:   [id number]

## 2010-07-27 NOTE — Letter (Signed)
Summary: [facility]   Imported By: Sherian Rein 03/14/2010 11:25:29  _____________________________________________________________________  External Attachment:    Type:   Image     Comment:   External Document

## 2010-07-27 NOTE — Progress Notes (Signed)
Summary: meds  Phone Note Call from Patient Call back at Home Phone [phone]   Caller: Patient Call For: nadel Summary of Call: pt requests to speak to nurse re: meds.  Initial call taken by: Tivis Ringer, CNA,  November 24, 2009 12:33 PM  Follow-up for Phone Call        Spoke with pt.  She states that she checked the phone tree and got the msg to decrease the synthroid from 88 micrograms to 75 micrograms.  Pt understands this change and rx was sent electronically to [facility].   Follow-up by: Vernie Murders,  November 24, 2009 1:41 PM    Prescriptions: LEVOTHYROXINE SODIUM 75 MCG TABS (LEVOTHYROXINE SODIUM) take 1 tab by mouth once daily...  #60 x 6   Entered by:   Vernie Murders   Authorized by:   Michele Mcalpine MD   Signed by:   Vernie Murders on 11/24/2009   Method used:   Electronically to        [facility] [phone]* (retail)       [address]       [place], [place]  [address]       Ph: [phone] or [phone]       Fax: [phone]   RxID:   [id number]   Appended Document: meds Medications Added VITAMIN D 2000 UNIT TABS (CHOLECALCIFEROL) take 1 tablet by mouth once daily      called and spoke with pt about her vit d level as well..she did get this part on the phonetree message too and will take the 2000units daily. Randell Loop CMA  November 24, 2009 2:45 PM     Clinical Lists Changes  Medications: Added new medication of VITAMIN D 2000 UNIT TABS (CHOLECALCIFEROL) take 1 tablet by mouth once daily

## 2010-07-27 NOTE — Letter (Signed)
Summary: Colonoscopy Letter  [facility]  [address] [place], [place] [address]   Phone: [phone]  Fax: [phone]      December 29, 2009 MRN: [id number]   [name] [address] [place], [place]  [address]   Dear Ms. Hoagland,   According to your medical record, it is time for you to schedule a Colonoscopy. The American Cancer Society recommends this procedure as a method to detect early colon cancer. Patients with a family history of colon cancer, or a personal history of colon polyps or inflammatory bowel disease are at increased risk.  This letter has beeen generated based on the recommendations made at the time of your procedure. If you feel that in your particular situation this may no longer apply, please contact our office.  Please call our office at [phone] to schedule this appointment or to update your records at your earliest convenience.  Thank you for cooperating with [place] to provide you with the very best care possible.   Sincerely,    Vania Rea. Jarold Motto, M.D.  [facility] [phone]

## 2010-07-27 NOTE — Letter (Signed)
Summary: [facility]  [facility]   Imported By: Lennie Odor 05/19/2010 12:41:35  _____________________________________________________________________  External Attachment:    Type:   Image     Comment:   External Document

## 2010-07-27 NOTE — Consult Note (Signed)
Summary: [facility]   Imported By: Lennie Odor 01/24/2010 12:17:40  _____________________________________________________________________  External Attachment:    Type:   Image     Comment:   External Document

## 2010-07-27 NOTE — Assessment & Plan Note (Signed)
Summary: rov 6 months//kp   CC:  6 month ROV & review of mult medical problems....  History of Present Illness: 70 y/o BF here for a follow up visit... she has mult med problems as noted below...    ~  Sep10:  she has had a good year but feels a little tired on occas (we discussed Vitamin regimen + FishOil etc)... she reports cat surg by DrHecker earlier this yr... no other complaints or concerns, she would like to get the Flu shot today.   ~  Dec10:  Add-on for hip pain> c/o right hip discomfort for 1 week- mostly w/ walking, no trauma, not tender, denies back pain or leg pain... she has never had a similar discomfort & ?tried Tramadol ?benefit.... we discussed Xray (neg), Rx w/ rest/ heat/ Dosepak/ Mobic...   ~  Nov 18, 2009:  she's had several eye probs treated by DrHecker (glaucoma, cats, dry eyes) & had laser Rx...  BP has been controlled & feeling well x for minor somatic complaints... wants FASTING blood work, and referral to Scientist, research (medical) as she doesn't want to make the trip to [facility] to see DrStacey for f/u Parkinson's dis...    Current Problem List:  GLAUCOMA (ICD-365.9) - on eye drops daily DrHecker and she reports cataract surg 2010, and Laser Rx 2011> followed for Glaucoma, s/p cats, dry eyes...  ~  6/10:  eye exam by DrHecker neg for retinopathy or macular changes...  ~  4/11: Argon laser trabeculoplasty planned by DrHecker.  ABNORMAL CHEST XRAY (ICD-793.1) - she has chr right hemidiaphragm elevation & no changes serially... last CXR 5/09 w/ elev right hemidiaph, no change.  ~  CXR 5/11 showed elev right hemidiaph & scarring right base, NAD...  HYPERTENSION (ICD-401.9) - on LISINOPRIL 10mg /d w/ good control... BP= 130/80 today, feeling well and denies HA, fatigue, visual changes, CP, palipit, dizziness, syncope, dyspnea, edema, etc...  PERIPHERAL VASCULAR DISEASE (ICD-443.9) - on ASA 81mg /d & PLAVIX 75mg /d... she is asymptomatic w/o focal weakness, sensory changes,  slurring, etc...  VENOUS INSUFFICIENCY (ICD-459.81) - on low sodium diet, etc... denies swelling...  HYPERCHOLESTEROLEMIA (ICD-272.0) - on diet rx alone...  ~  FLP 5/08 showed TChol 217, TG 66, HDL 58, LDL 130...  ~  FLP 5/09 showed TChol 194, TG 74, HDL 53, LDL 127... she prefers diet alone...  ~  FLP 5/10 showed TChol 218, TG 60, HDL 62, LDL 142... still prefers diet + FishOil rec.  ~  FLP 5/11 showed TChol 193, TG 49, HDL 63, LDL 120... improved, continue diet, get wt down.  DIABETES MELLITUS, BORDERLINE (ICD-790.29) - on diet rx alone "I'm doing better" weight= 126# = stable...  ~  labs 5/08 showed BS=99, HGA1c=5.9  ~  labs 5/09 showed BS= 90, HgA1c= 5.9  ~  labs 5/10 showed BS= 92, A1c= 5.7  ~  labs 5/11 showed BS= 92  HYPOTHYROIDISM (ICD-244.9) - on SYNTHROID 78mcg/d... Hx HYPERTHYROIDISM w/ I-131 rx 9/05...   ~  labs 5/08 showed TSH= 0.73  ~  labs 5/09 showed TSH= 0.72  ~  labs 5/10 showed TSH= 0.49  ~  labs 5/11 showed TSH= 0.28... rec> decr dose 52mcg/d (she is concerned about "facial hair")  DIVERTICULOSIS OF COLON (ICD-562.10) - there is a +fam hx of colon cancer in her father...  ~  last colonoscopy 8/06 by DrPatterson showed divertics, hems... f/u 74yrs.  ~  5/11:  we will send reminder to GI re: colonoscopy due 8/11...  DEGENERATIVE JOINT DISEASE (  ICD-715.90) - on TRAMADOL 50mg  Prn...she takes calcium & vitamins (including "Nature's Code which has 1,200 u Vit D)... prev on Celebrex but uses OTC meds now as needed... she tells me she had a BMD at DrBertrand[facility] in 2008 per her GYN.  ~  12/10: presents w/ right hip pain> XRays neg; Rx w/ Dosepak, Mobic, rest, heat...  BACK PAIN, LUMBAR (ICD-724.2) - she is s/p lumbar laminectomy 6/07 by DrGioffre for spinal stenosis...  OSTEOPENIA (ICD-733.90) - she had a normal BMD in 2000- ?when her last BMD was done?... Vit D level was 24 May09 and started on 50,000 u weekly... f/u Vit D level 11/09 = 47 & switched to OTC Vit D  supplement.  ~  labs 5/11 showed Vit D level = 29... rec incr VIt D to 2000 u daily.  PARKINSON'S DISEASE (ICD-332.0) - she has mod severe Parkinson's dis followed by DrWeymann & DrStacey at [facility] on STELEVO 150mg - 5 times daily... she has been doing well & very stable on this medication...  ~  5/11: she requests referral to [facility] for f/u Parkinson's...  ANXIETY (ICD-300.00) - prev on Prn Valium but off this medication now...    Allergies (verified): No Known Drug Allergies  Comments:  Nurse/Medical Assistant: The patient's medications and allergies were reviewed with the patient and were updated in the Medication and Allergy Lists.  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) HYPOTHYROIDISM (ICD-244.9) DIVERTICULOSIS OF COLON (ICD-562.10) DEGENERATIVE JOINT DISEASE (ICD-715.90) BACK PAIN, LUMBAR (ICD-724.2) OSTEOPENIA (ICD-733.90) PARKINSON'S DISEASE (ICD-332.0) ANXIETY (ICD-300.00)  Past Surgical History: S/P ovarian cystectomy S/P laparoscopic cholecystectomy 4/05 by Lurline Hare S/P lumbar laminectomy & foraminotomies for spinal stenosis 6/07 by DrGioffre S/P cataract surg & argon laser trabeculoplasty by DrHecker 2010-11  Family History: Reviewed history and no changes required. mother deceased age 54 from stroke  hx of HBP/heart disease father deceased age 76 from colon cancer 6 brothers--1 deceased of liver/pancreatic cancer 5 brothers alive and well  1 with HBP  Social History: Reviewed history and no changes required. married retired 2 children in good health never smoked no etoh  Review of Systems      See HPI       The patient complains of decreased hearing, dyspnea on exertion, muscle weakness, and difficulty walking.   The patient denies anorexia, fever, weight loss, weight gain, vision loss, hoarseness, chest pain, syncope, peripheral edema, prolonged cough, headaches, hemoptysis, abdominal pain, melena, hematochezia, severe indigestion/heartburn, hematuria, incontinence, suspicious skin lesions, transient blindness, depression, unusual weight change, abnormal bleeding, enlarged lymph nodes, and angioedema.    Vital Signs:  Patient profile:   70 year old female Height:      60 inches Weight:      126 pounds O2 Sat:      99 % on Room air Temp:     97.1 degrees F oral Pulse rate:   53 / minute BP sitting:   130 / 80  (left arm) Cuff size:   regular  Vitals Entered By: Randell Loop CMA (Nov 18, 2009 10:44 AM)  O2 Sat at Rest %:  99 O2 Flow:  Room air CC: 6 month ROV & review of mult medical problems... Is Patient Diabetic? No Pain Assessment Patient in pain? no      Comments meds updated today with pt   Physical Exam  Additional Exam:  WD, WN, 69 y/o BF in  NAD... slow moving but only min tremor from Parkinsons... GENERAL:  Alert & oriented; pleasant & cooperative... HEENT:  [place]/AT, EOM-wnl, PERRLA, EACs-clear, TMs-wnl, NOSE-clear, THROAT-clear & wnl. NECK:  Supple w/ fairROM; no JVD; normal carotid impulses w/o bruits; no thyromegaly or nodules palpated; no lymphadenopathy. CHEST:  Clear to P & A; without wheezes/ rales/ or rhonchi. HEART:  Regular Rhythm; without murmurs/ rubs/ or gallops. ABDOMEN:  Soft & nontender; normal bowel sounds; no organomegaly or masses detected. EXT: without deformities, mod arthritic changes; no varicose veins/ +venous insuffic/ no edema. fairly good ROM in hips etc... NEURO:  CN's intact; min tremor and bradykinesia of Parkinson's; no focal neuro deficits... DERM:  No lesions noted; no rash etc...    CXR  Procedure date:  11/18/2009  Findings:      CHEST - 2 VIEW 11/18/2009: Comparison: Two-view chest x-ray 10/26/2017, 10/30/2005, and 10/11/2003.    Findings: Chronic severe elevation of the right hemidiaphragm, unchanged.  Cardiomediastinal silhouette unremarkable for age, unchanged.  Mild chronic linear scarring in the right lower lobe. Lungs otherwise clear.  No pleural effusions.  Visualized bony thorax intact.  No significant interval change.   IMPRESSION: Chronic elevation of the right hemidiaphragm and associated mild scarring in the right lower lobe.  No acute cardiopulmonary disease.  Stable since April, 2005.   Read By:  Arnell Sieving,  M.D.   MISC. Report  Procedure date:  11/18/2009  Findings:      BMP (METABOL)   Sodium                    145 mEq/L                   135-145   Potassium                 4.6 mEq/L                   3.5-5.1   Chloride                  107 mEq/L                   96-112   Carbon Dioxide            30 mEq/L                    19-32   Glucose                   92 mg/dL                    32-35   BUN                       18 mg/dL                    5-73   Creatinine                0.8 mg/dL                   2.2-0.2   Calcium                   9.6 mg/dL                   5.4-27.0   GFR  92.24 mL/min                >60  Hepatic/Liver Function Panel (HEPATIC)   Total Bilirubin           1.1 mg/dL                   1.1-9.1   Direct Bilirubin          0.2 mg/dL                   4.7-8.2   Alkaline Phosphatase      59 U/L                      39-117   AST                       24 U/L                      0-37   ALT                       4 U/L                       0-35   Total Protein             6.8 g/dL                    9.5-6.2   Albumin                   4.5 g/dL                    1.3-0.8  CBC Platelet w/Diff (CBCD)   White Cell Count     [L]  3.5 K/uL                    4.5-10.5   Red Cell Count            4.42 Mil/uL                 3.87-5.11   Hemoglobin                14.0 g/dL                   65.7-84.6   Hematocrit                40.6 %                       36.0-46.0   MCV                       91.7 fl                     78.0-100.0   Platelet Count            204.0 K/uL                  150.0-400.0   Neutrophil %              55.5 %                      43.0-77.0   Lymphocyte %  38.1 %                      12.0-46.0   Monocyte %                4.3 %                       3.0-12.0   Eosinophils%              0.9 %                       0.0-5.0   Basophils %               1.2 %                       0.0-3.0  Comments:      Lipid Panel (LIPID)   Cholesterol               193 mg/dL                   8-756   Triglycerides             49.0 mg/dL                  4.3-329.5   HDL                       18.84 mg/dL                 >16.60   LDL Cholesterol      [H]  630 mg/dL                   1-60   TSH (TSH)   FastTSH              [L]  0.28 uIU/mL                 0.35-5.50  Vitamin D (25-Hydroxy) (10932)  Vitamin D (25-Hydroxy)                        [L]  29 ng/mL                    30-89   Impression & Recommendations:  Problem # 1:  GLAUCOMA (ICD-365.9) She's had extensive eye eval & Rx from DrHecker...  Problem # 2:  ABNORMAL CHEST XRAY (ICD-793.1) Stable right hemidiph elevation, otherw no change... Orders: T-2 View CXR (71020TC)  Problem # 3:  HYPERTENSION (ICD-401.9) Controlled>  same meds. Her updated medication list for this problem includes:    Lisinopril 10 Mg Tabs (Lisinopril) .Marland Kitchen... Take 1 tablet by mouth once a day  Orders: Prescription Created Electronically [phone]) T-2 View CXR (71020TC) TLB-BMP (Basic Metabolic Panel-BMET) (80048-METABOL) TLB-Hepatic/Liver Function Pnl (80076-HEPATIC) TLB-CBC Platelet - w/Differential (85025-CBCD) TLB-Lipid Panel (80061-LIPID) TLB-TSH (Thyroid Stimulating Hormone) (84443-TSH) T-Vitamin D (25-Hydroxy) (22025-42706)  Problem # 4:  PERIPHERAL VASCULAR DISEASE (ICD-443.9) She is to continue the ASA & Plavix...  Problem # 5:  HYPERCHOLESTEROLEMIA  (ICD-272.0) FLP still not at goal but she has repeatedly refused Statin med Rx preferring diet + exercise alone...  Problem # 6:  DIABETES MELLITUS, BORDERLINE (ICD-790.29) She remains well controlled on diet... no med Rx indicated...  Problem # 7:  HYPOTHYROIDISM (ICD-244.9) She is concerned about facial hair & rec to  see her Dermatologist for options here... TFT's on the Synthroid shows sl low TSH> rec decr the Synthroid to 34mcg/d... Her updated medication list for this problem includes:    Levothyroxine Sodium 75 Mcg Tabs (Levothyroxine sodium) .Marland Kitchen... Take 1 tab by mouth once daily...  Problem # 8:  PARKINSON'S DISEASE (ICD-332.0) Severe dis but well regulated on the Stalevo per DrStacey at [facility]... the distance travelled is a prob for her & requesting to re-estab w/ [facility] (prev DrWeymann)...  Problem # 9:  OTHER MEDICAL PROBLEMS AS NOTED>>>  Complete Medication List: 1)  Murine Tears For Dry Eyes 5-6 Mg/ml Soln (Polyvinyl alcohol-povidone) .... Use as directed 2)  Adult Aspirin Ec Low Strength 81 Mg Tbec (Aspirin) .... Take 1 tablet by mouth once a day 3)  Plavix 75 Mg Tabs (Clopidogrel bisulfate) .... Take 1 tablet by mouth once a day 4)  Lisinopril 10 Mg Tabs (Lisinopril) .... Take 1 tablet by mouth once a day 5)  Levothyroxine Sodium 75 Mcg Tabs (Levothyroxine sodium) .... Take 1 tab by mouth once daily.Marland KitchenMarland Kitchen 6)  Stalevo 150 37.5-150-200 Mg Tabs (Carbidopa-levodopa-entacapone) .... Take one tablet by mouth 5 times daily as directed by dr. Misty Stanley 7)  Tramadol Hcl 50 Mg Tabs (Tramadol hcl) .... Take 1 tab by mouth three times a day with food as needed for pain.Marland KitchenMarland Kitchen 8)  Caltrate 600+d 600-400 Mg-unit Tabs (Calcium carbonate-vitamin d) .... Take 1 tab by mouth two times a day... 9)  Womens Multivitamin Plus Tabs (Multiple vitamins-minerals) .... Take 1 tab daily... 10)  Vitamin B Complex Tabs (B complex vitamins) .... Take 1 tab daily...  Other Orders: Neurology Referral  (Neuro) Gastroenterology Referral (GI)  Patient Instructions: 1)  Today we updated your med list- see below.... 2)  Continue your current meds the same... 3)  We refilled your meds for 60d supplies as you requested... 4)  Today we also did your follow up CXR & FASTING blood work... please call the "phone tree" in a few days for your lab results.Marland KitchenMarland Kitchen 5)  We will send a reminder to DrPatterson regarding your up coming colonoscopy... 6)  We will set up a referral to [facility] for your Parkinson's Disease... 7)  Call for any problems... 8)  Please schedule a follow-up appointment in 6 months. Prescriptions: LEVOTHYROXINE SODIUM 75 MCG TABS (LEVOTHYROXINE SODIUM) take 1 tab by mouth once daily...  #60 x 6   Entered and Authorized by:   Michele Mcalpine MD   Signed by:   Michele Mcalpine MD on 11/19/2009   Method used:   Print then Give to Patient   RxID:   [id number] TRAMADOL HCL 50 MG TABS (TRAMADOL HCL) take 1 tab by mouth three times a day with food as needed for pain...  #100 x 6   Entered and Authorized by:   Michele Mcalpine MD   Signed by:   Michele Mcalpine MD on 11/18/2009   Method used:   Print then Give to Patient   RxID:   [id number] SYNTHROID 88 MCG  TABS (LEVOTHYROXINE SODIUM) Take 1 tablet by mouth once a day  #60 x 6   Entered and Authorized by:   Michele Mcalpine MD   Signed by:   Michele Mcalpine MD on 11/18/2009   Method used:   Print then Give to Patient   RxID:   [id number] LISINOPRIL 10 MG  TABS (LISINOPRIL) Take 1 tablet by mouth once a day  #60 x 6   Entered  and Authorized by:   Michele Mcalpine MD   Signed by:   Michele Mcalpine MD on 11/18/2009   Method used:   Print then Give to Patient   RxID:   [id number] PLAVIX 75 MG  TABS (CLOPIDOGREL BISULFATE) Take 1 tablet by mouth once a day  #60 x 6   Entered and Authorized by:   Michele Mcalpine MD   Signed by:   Michele Mcalpine MD on 11/18/2009   Method used:   Print then Give to Patient   RxID:    [id number]    Immunization History:  Influenza Immunization History:    Influenza:  historical (07/06/2009)

## 2010-07-27 NOTE — Consult Note (Signed)
Summary: [facility]  [facility]   Imported By: Lester Cokesbury 12/21/2009 07:59:25  _____________________________________________________________________  External Attachment:    Type:   Image     Comment:   External Document

## 2010-07-27 NOTE — Letter (Signed)
Summary: [facility]  [facility]   Imported By: Lennie Odor 10/18/2009 14:16:20  _____________________________________________________________________  External Attachment:    Type:   Image     Comment:   External Document

## 2010-09-05 ENCOUNTER — Ambulatory Visit: Payer: Self-pay | Admitting: Physical Therapy

## 2010-09-08 LAB — SURGICAL PCR SCREEN: Staphylococcus aureus: NEGATIVE

## 2010-09-08 LAB — GLUCOSE, CAPILLARY
Glucose-Capillary: 104 mg/dL — ABNORMAL HIGH (ref 70–99)
Glucose-Capillary: 161 mg/dL — ABNORMAL HIGH (ref 70–99)
Glucose-Capillary: 77 mg/dL (ref 70–99)

## 2010-09-08 LAB — CBC
HCT: 42.7 % (ref 36.0–46.0)
Hemoglobin: 13.4 g/dL (ref 12.0–15.0)
MCH: 29.6 pg (ref 26.0–34.0)
MCHC: 31.4 g/dL (ref 30.0–36.0)
RBC: 4.52 MIL/uL (ref 3.87–5.11)
WBC: 5.5 10*3/uL (ref 4.0–10.5)

## 2010-09-08 LAB — DIFFERENTIAL
Lymphocytes Relative: 27 % (ref 12–46)
Lymphs Abs: 1.5 10*3/uL (ref 0.7–4.0)
Monocytes Absolute: 0.3 10*3/uL (ref 0.1–1.0)
Monocytes Relative: 6 % (ref 3–12)
Neutro Abs: 3.6 10*3/uL (ref 1.7–7.7)

## 2010-09-08 LAB — COMPREHENSIVE METABOLIC PANEL
CO2: 27 mEq/L (ref 19–32)
Calcium: 9.2 mg/dL (ref 8.4–10.5)
GFR calc Af Amer: >60 mL/min (ref >60)
GFR calc non Af Amer: >60 mL/min (ref >60)
Glucose, Bld: 90 mg/dL (ref 70–99)
Total Bilirubin: 0.8 mg/dL (ref 0.3–1.2)
Total Protein: 6.8 g/dL (ref 6.0–8.3)

## 2010-09-12 ENCOUNTER — Ambulatory Visit: Payer: Medicare Other | Attending: Diagnostic Neuroimaging | Admitting: Physical Therapy

## 2010-09-12 DIAGNOSIS — G20 Parkinson's disease: Secondary | ICD-10-CM | POA: Insufficient documentation

## 2010-09-12 DIAGNOSIS — G20A1 Parkinson's disease without dyskinesia, without mention of fluctuations: Secondary | ICD-10-CM | POA: Insufficient documentation

## 2010-09-12 DIAGNOSIS — IMO0001 Reserved for inherently not codable concepts without codable children: Secondary | ICD-10-CM | POA: Insufficient documentation

## 2010-09-12 DIAGNOSIS — R269 Unspecified abnormalities of gait and mobility: Secondary | ICD-10-CM | POA: Insufficient documentation

## 2010-09-14 ENCOUNTER — Ambulatory Visit: Payer: Medicare Other | Admitting: Physical Therapy

## 2010-09-18 ENCOUNTER — Ambulatory Visit: Payer: Medicare Other | Admitting: Physical Therapy

## 2010-09-20 ENCOUNTER — Ambulatory Visit: Payer: Federal, State, Local not specified - PPO | Admitting: Physical Therapy

## 2010-09-25 ENCOUNTER — Ambulatory Visit: Payer: Federal, State, Local not specified - PPO | Admitting: Physical Therapy

## 2010-09-26 ENCOUNTER — Ambulatory Visit: Payer: Medicare Other | Attending: Diagnostic Neuroimaging | Admitting: Physical Therapy

## 2010-09-26 DIAGNOSIS — G20A1 Parkinson's disease without dyskinesia, without mention of fluctuations: Secondary | ICD-10-CM | POA: Insufficient documentation

## 2010-09-26 DIAGNOSIS — R269 Unspecified abnormalities of gait and mobility: Secondary | ICD-10-CM | POA: Insufficient documentation

## 2010-09-26 DIAGNOSIS — G20 Parkinson's disease: Secondary | ICD-10-CM | POA: Insufficient documentation

## 2010-09-26 DIAGNOSIS — IMO0001 Reserved for inherently not codable concepts without codable children: Secondary | ICD-10-CM | POA: Insufficient documentation

## 2010-09-28 ENCOUNTER — Ambulatory Visit: Payer: Medicare Other | Admitting: Physical Therapy

## 2010-10-03 ENCOUNTER — Ambulatory Visit: Payer: Medicare Other | Admitting: Physical Therapy

## 2010-10-05 ENCOUNTER — Ambulatory Visit: Payer: Medicare Other | Admitting: Physical Therapy

## 2010-10-10 ENCOUNTER — Ambulatory Visit: Payer: Medicare Other | Admitting: Physical Therapy

## 2010-10-12 ENCOUNTER — Ambulatory Visit: Payer: Medicare Other | Admitting: Physical Therapy

## 2010-11-08 ENCOUNTER — Encounter: Payer: Self-pay | Admitting: Pulmonary Disease

## 2010-11-08 ENCOUNTER — Ambulatory Visit (INDEPENDENT_AMBULATORY_CARE_PROVIDER_SITE_OTHER): Payer: Medicare Other | Admitting: Pulmonary Disease

## 2010-11-08 ENCOUNTER — Ambulatory Visit (INDEPENDENT_AMBULATORY_CARE_PROVIDER_SITE_OTHER)
Admission: RE | Admit: 2010-11-08 | Discharge: 2010-11-08 | Disposition: A | Discharge status: Home / Self Care | Payer: Medicare Other | Source: Ambulatory Visit | Attending: Pulmonary Disease | Admitting: Pulmonary Disease

## 2010-11-08 DIAGNOSIS — E7800 Pure hypercholesterolemia, unspecified: Secondary | ICD-10-CM

## 2010-11-08 DIAGNOSIS — K5730 Diverticulosis of large intestine without perforation or abscess without bleeding: Secondary | ICD-10-CM

## 2010-11-08 DIAGNOSIS — L089 Local infection of the skin and subcutaneous tissue, unspecified: Secondary | ICD-10-CM | POA: Insufficient documentation

## 2010-11-08 DIAGNOSIS — I10 Essential (primary) hypertension: Secondary | ICD-10-CM

## 2010-11-08 DIAGNOSIS — C50919 Malignant neoplasm of unspecified site of unspecified female breast: Secondary | ICD-10-CM

## 2010-11-08 DIAGNOSIS — E039 Hypothyroidism, unspecified: Secondary | ICD-10-CM

## 2010-11-08 DIAGNOSIS — L723 Sebaceous cyst: Secondary | ICD-10-CM

## 2010-11-08 DIAGNOSIS — I872 Venous insufficiency (chronic) (peripheral): Secondary | ICD-10-CM

## 2010-11-08 DIAGNOSIS — G20 Parkinson's disease: Secondary | ICD-10-CM

## 2010-11-08 DIAGNOSIS — M949 Disorder of cartilage, unspecified: Secondary | ICD-10-CM

## 2010-11-08 DIAGNOSIS — M1990 Unspecified osteoarthritis, unspecified site: Secondary | ICD-10-CM

## 2010-11-08 DIAGNOSIS — I739 Peripheral vascular disease, unspecified: Secondary | ICD-10-CM

## 2010-11-08 DIAGNOSIS — R7309 Other abnormal glucose: Secondary | ICD-10-CM

## 2010-11-08 DIAGNOSIS — F411 Generalized anxiety disorder: Secondary | ICD-10-CM

## 2010-11-08 MED ORDER — CLOPIDOGREL BISULFATE 75 MG PO TABS: 75.0000 mg | ORAL_TABLET | Freq: Every day | ORAL | Status: DC

## 2010-11-08 MED ORDER — LEVOTHYROXINE SODIUM 75 MCG PO TABS: 75.0000 ug | ORAL_TABLET | Freq: Every day | ORAL | Status: DC

## 2010-11-08 MED ORDER — LISINOPRIL 10 MG PO TABS: 10.0000 mg | ORAL_TABLET | Freq: Every day | ORAL | Status: DC

## 2010-11-08 NOTE — Progress Notes (Signed)
Subjective:    Patient ID: [name], female    DOB: 09-25-1940, 70 y.o.   MRN: [id number]  HPI 70 y/o BF here for a follow up visit... she has mult med problems as noted below...   ~  Nov 18, 2009:  she's had several eye probs treated by DrHecker (glaucoma, cats, dry eyes) & had laser Rx...  BP has been controlled & feeling well x for minor somatic complaints... wants FASTING blood work, and referral to Scientist, research (medical) as she doesn't want to make the trip to [facility] to see DrStacey for f/u Parkinson's dis...  ~  May 10, 2010:  she saw DrPenumalli for Neuro> Parkinson's dis & Rx w/ Stalevo 150- 5times daily (hx hallucinations on the 200 tabs), no changes made... 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   ~  Nov 08, 2010:  83mo ROV & doing well overall>  Worried about a "mole" on her back but exam shows mod sized sebaceous cyst & will need excision by DrWakefield when she is ready;  She has a f/u colonoscopy sched for 6/12 w/ DrPatterson & an Oncology f/u w/ Andi Hence next week;  She requests refills for 90d supplies, CXR today, & she will return for fasting labs in the AM> see prob list below:         Problem List:  GLAUCOMA (ICD-365.9) - on eye drops daily DrHecker and she reports cataract surg 2010, and Laser Rx 2011> followed for Glaucoma, s/p cats, dry eyes... ~  6/10:  eye exam by DrHecker neg for retinopathy or macular changes... ~  4/11: Argon laser trabeculoplasty planned by DrHecker.  ABNORMAL CHEST XRAY (ICD-793.1) - she has chr right hemidiaphragm elevation & no changes serially...  ~  CXR 5/09 w/ elev right hemidiaph, no change. ~  CXR 5/11 showed elev right hemidiaph & scarring right base, NAD. ~  CXR 5/12 ==> pending  HYPERTENSION (ICD-401.9) - on LISINOPRIL 10mg /d w/ good control...  ~  11/11: BP= 120/82> feeling well and  denies HA, fatigue, visual changes, CP, palipit, dizziness, syncope, dyspnea, edema, etc... ~  5/12:  BP= 110/70> remains asymptomatic...  PERIPHERAL VASCULAR DISEASE (ICD-443.9) - on ASA 81mg /d & PLAVIX 75mg /d... she is asymptomatic w/o focal weakness, sensory changes, slurring, etc...  VENOUS INSUFFICIENCY (ICD-459.81) - on low sodium diet, etc... denies swelling...  HYPERCHOLESTEROLEMIA (ICD-272.0) - on diet rx alone... ~  FLP 5/08 showed TChol 217, TG 66, HDL 58, LDL 130... ~  FLP 5/09 showed TChol 194, TG 74, HDL 53, LDL 127... she prefers diet alone... ~  FLP 5/10 showed TChol 218, TG 60, HDL 62, LDL 142... still prefers diet + FishOil rec. ~  FLP 5/11 showed TChol 193, TG 49, HDL 63, LDL 120... improved, continue diet, get wt down. ~  FLP 5/12 ==> pending  DIABETES MELLITUS, BORDERLINE (ICD-790.29) - on diet rx alone & doing very well... ~  labs 5/08 showed BS=99, HGA1c=5.9 ~  labs 5/09 showed BS= 90, HgA1c= 5.9 ~  labs 5/10 showed BS= 92, A1c= 5.7 ~  labs 5/11 (wt= 126#) showed BS= 92 ~  Labs 5/12 (wt=130#) showed ==> pending  HYPOTHYROIDISM (ICD-244.9) - on SYNTHROID 56mcg/d... Hx HYPERTHYROIDISM w/ I-131 rx 9/05...  ~  labs 5/08 on Synthroid 14mcg/d showed TSH= 0.73... keep same. ~  labs 5/09 on Synth88 showed TSH= 0.72 ~  labs  5/10 on Synth88 showed TSH= 0.49 ~  labs 5/11 on Synth88 showed TSH= 0.28... rec> decr dose 46mcg/d (she is concerned about "facial hair") ~  labs 11/11 on Synthroid 66mcg/d showed TSH= 3.92... keep same. ~  Labs 5/12 on Synth75 showed ==> pending  DIVERTICULOSIS OF COLON (ICD-562.10) - there is a +fam hx of colon cancer in her father... ~  last colonoscopy 8/06 by DrPatterson showed divertics, hems... f/u 68yrs. 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 ~  She continues periodic f/u evals by DrWakefield, DrMoody, & DrKhan...  DEGENERATIVE JOINT DISEASE (ICD-715.90) & HIP PAIN (ICD-719.45) - she takes calcium & vitamins (including "Nature's Code which has 1,200 u Vit D)... prev on Celebrex & Tramadol but uses OTC meds now as needed... she tells me she had a BMD at DrBertrand's in 2008 per her GYN. ~  12/10: presents w/ right hip pain> XRays neg; Rx w/ Dosepak, Mobic, rest, heat... ~  11/11:  c/o left hip pain & XRay showed mild degen changes> Rx MOBIC 7.5mg  Prn...  BACK PAIN, LUMBAR (ICD-724.2) - she is s/p lumbar laminectomy 6/07 by DrGioffre for spinal stenosis... ~  Lower spine degen changes seen on f/u XRays...  OSTEOPENIA (ICD-733.90) - she had a normal BMD in 2000- ?when her last BMD was done?... Vit D level was 24 May09 and started on 50,000 u weekly... f/u Vit D level 11/09 = 47 & switched to OTC Vit D supplement. ~  labs 5/11 showed Vit D level = 29... rec incr VIt D to 2000 u daily. ~  She is encouraged to get a follow up BMD when she has her next mammogram & will let us know...  PARKINSON'S DISEASE (ICD-332.0) - she has mod severe Parkinson's dis followed by DrPenumalli & DrStacey at [facility] on STELEVO 150mg - 5 times daily... she has been doing well & very stable on this medication... ~  5/11: she requests referral to [facility] for f/u Parkinson's> seen by DrPenumalli & no changes made.  ANXIETY (ICD-300.00) - prev on Prn Valium but off this medication now...  SEBACEOUS CYST>  She mentioned a "mole" on her back during 5/12 OV & exam revealed a mod sized seb cyst; rec to ask DrWakfield to excise this at his convenience.   Past Surgical History  Procedure Date  . Ovarian cyst removal   . Lapraoscopic cholecystectomy 09/2003    Dr. Derrell Lolling  . Lumbar laminectomy and foraminotomies for spinal stenosis 11/2005    Dr. Darrelyn Hillock    . Cataract surgery and argon laser trabeculoplasty 04/2010    Dr. Elmer Picker  . Breast lumpectomy 8/11 by DrWakefield    Outpatient Encounter Prescriptions as of 11/08/2010  Medication Sig Dispense Refill  . aspirin 81 MG tablet Take 81 mg by mouth daily.        . Calcium Carbonate-Vitamin D (CALTRATE 600+D) 600-400 MG-UNIT per tablet Take 1 tablet by mouth 2 (two) times daily.        . carbidopa-levodopa-entacapone (STALEVO 150) 37.5-150-200 MG per tablet Take 1 tablet by mouth 5 (five) times daily as needed. As directed by Dr. Misty Stanley        .  Cholecalciferol (VITAMIN D) 2000 UNITS CAPS Take 1 capsule by mouth daily.        . cycloSPORINE (RESTASIS) 0.05 % ophthalmic emulsion Place 1 drop into both eyes 2 (two) times daily.        Marland Kitchen levothyroxine (SYNTHROID, LEVOTHROID) 75 MCG tablet Take 1 tablet (75 mcg total) by mouth daily.  90 tablet  3  . lisinopril (PRINIVIL,ZESTRIL) 10 MG tablet Take 1 tablet (10 mg total) by mouth daily.  90 tablet  3  . meloxicam (MOBIC) 7.5 MG tablet Take 7.5 mg by mouth 2 (two) times daily as needed.        . Multiple Vitamins-Minerals (WOMENS MULTIVITAMIN PLUS PO) Take 1 tablet by mouth daily.        . clopidogrel (PLAVIX) 75 MG tablet Take 1 tablet (75 mg total) by mouth daily.  90 tablet  3    No Known Allergies   Review of Systems        See HPI - all other systems neg except as noted... The patient complains of dyspnea on exertion, muscle weakness, and difficulty walking.  The patient denies anorexia, fever, weight loss, weight gain, vision loss, decreased hearing, hoarseness, chest pain, syncope, peripheral edema, prolonged cough, headaches, hemoptysis, abdominal pain, melena, hematochezia, severe indigestion/heartburn, hematuria, incontinence, suspicious skin lesions, transient blindness, depression, unusual weight change, abnormal bleeding, enlarged lymph nodes, and angioedema.     Objective:   Physical Exam    WD, WN, 70 y/o BF in NAD.Marland KitchenMarland Kitchen slow moving  but only min tremor from Parkinsons... GENERAL:  Alert & oriented; pleasant & cooperative... HEENT:  [place]/AT, EOM-wnl, PERRLA, EACs-clear, TMs-wnl, NOSE-clear, THROAT-clear & wnl. NECK:  Supple w/ fairROM; no JVD; normal carotid impulses w/o bruits; no thyromegaly or nodules palpated; no lymphadenopathy. CHEST:  Clear to P & A; without wheezes/ rales/ or rhonchi. HEART:  Regular Rhythm; without murmurs/ rubs/ or gallops. ABDOMEN:  Soft & nontender; normal bowel sounds; no organomegaly or masses detected. EXT: without deformities, mod arthritic changes; no varicose veins/ +venous insuffic/ no edema. fairly good ROM in hips etc... NEURO:  CN's intact; min tremor and bradykinesia of Parkinson's; no focal neuro deficits... DERM:  No lesions noted; no rash etc...   Assessment & Plan:   HBP>  Stable on Lisinopril, tolerating well, continue same...  ASPVD>  Stable on ASA/ Plavix & denies cerebral ischemic symptoms, she does not want to change meds...  CHOL>  Her lipids are fair on diet alone; she has prev declined med Rx & content to treat w/ diet alone; she will ret for yearly f/u fasting blood work...  DM>  Borderline at worst, her BS & A1c has been normal x yrs; repaet blood work 2012 is pending...  Hypothyroidism>  Clinically stable on 7mcg/d...  GI>  Due for her f/u colonoscopy & she tells me this is sched for next month...  BREAST CANCER>  She had left breast DCIS excised 8/11 w/ post op XRT as noted...  DJD>  She tells me she still managed quite satis on OTC analgesics...  Osteopenia>  She is reminded to get a f/u BMD w/ her next mammogram at the imaging center...  Parkinsons>  She is stable w/ mod severe dis on the Stalevo Rx.Marland KitchenMarland Kitchen

## 2010-11-08 NOTE — Patient Instructions (Signed)
Today we updated your med list in EPIC...    Continue your current meds the same...  Today we did your follow up CXR & please return to our lab one morning for your fasting blood work...    Please call the PHONE TREE in a few days for your results...    Dial [id number] & when prompted enter your patient number followed by the # symbol...    Your patient number is:  [id number]#  You have a medium sized sebaceous cyst on your back; when you are ready call DrWakefield at [phone] to check it & plan to excise it at your convenience...  Call for any questions...  Let's plan a check up in 6 months, sooner if needed for problems.Marland KitchenMarland Kitchen

## 2010-11-09 ENCOUNTER — Ambulatory Visit: Payer: Self-pay | Admitting: Pulmonary Disease

## 2010-11-09 ENCOUNTER — Other Ambulatory Visit (INDEPENDENT_AMBULATORY_CARE_PROVIDER_SITE_OTHER): Payer: Medicare Other

## 2010-11-09 DIAGNOSIS — F411 Generalized anxiety disorder: Secondary | ICD-10-CM

## 2010-11-09 DIAGNOSIS — C50919 Malignant neoplasm of unspecified site of unspecified female breast: Secondary | ICD-10-CM

## 2010-11-09 DIAGNOSIS — E7800 Pure hypercholesterolemia, unspecified: Secondary | ICD-10-CM

## 2010-11-09 DIAGNOSIS — I10 Essential (primary) hypertension: Secondary | ICD-10-CM

## 2010-11-09 DIAGNOSIS — R7309 Other abnormal glucose: Secondary | ICD-10-CM

## 2010-11-09 LAB — BASIC METABOLIC PANEL
BUN: 18 mg/dL (ref 6–23)
CO2: 30 mEq/L (ref 19–32)
Calcium: 9.4 mg/dL (ref 8.4–10.5)
Creatinine, Ser: 0.8 mg/dL (ref 0.4–1.2)
Glucose, Bld: 75 mg/dL (ref 70–99)

## 2010-11-09 LAB — LIPID PANEL
HDL: 60.2 mg/dL (ref >39.00)
Total CHOL/HDL Ratio: 3
Triglycerides: 54 mg/dL (ref 0.0–149.0)

## 2010-11-09 LAB — CBC WITH DIFFERENTIAL/PLATELET
Eosinophils Relative: 0.8 % (ref 0.0–5.0)
HCT: 39.6 % (ref 36.0–46.0)
Hemoglobin: 13.5 g/dL (ref 12.0–15.0)
Lymphs Abs: 0.9 10*3/uL (ref 0.7–4.0)
Monocytes Relative: 5.1 % (ref 3.0–12.0)
Neutro Abs: 2.3 10*3/uL (ref 1.4–7.7)
Platelets: 183 10*3/uL (ref 150.0–400.0)
WBC: 3.5 10*3/uL — ABNORMAL LOW (ref 4.5–10.5)

## 2010-11-09 LAB — HEPATIC FUNCTION PANEL
Albumin: 3.9 g/dL (ref 3.5–5.2)
Total Protein: 6.2 g/dL (ref 6.0–8.3)

## 2010-11-09 LAB — TSH: TSH: 1.89 u[IU]/mL (ref 0.35–5.50)

## 2010-11-10 NOTE — Op Note (Signed)
NAMEVAUDIE, [name]               ACCOUNT NO.:  [id number]   MEDICAL RECORD NO.:  [id number]          PATIENT TYPE:  INP   LOCATION:  0003                         FACILITY:  [facility]   PHYSICIAN:  Georges Lynch. Gioffre, M.D.DATE OF BIRTH:  01-Nov-1940   DATE OF PROCEDURE:  12/05/2005  DATE OF DISCHARGE:                                 OPERATIVE REPORT   SURGEON:  Dr.  Darrelyn Hillock   ASSISTANT:  Dr. Jene Every.   PREOP DIAGNOSES:  1.  Spinal stenosis at L2-L3.  2.  Spinal stenosis at L3-4.  3.  Spinal stenosis at L4-5.  4.  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  5.  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  6.  Pseudo spondylolisthesis at L4-5.   POSTOPERATIVE DIAGNOSES:  1.  Spinal stenosis at L2-L3.  2.  Spinal stenosis at L3-4.  3.  Spinal stenosis at L4-5.  4.  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  5.  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  6.  Pseudo spondylolisthesis at L4-5.   OPERATION:  1.  Central decompression lumbar laminectomy at 3 levels at L2-3, L3-4, L4-      5.  2.  Foraminotomies, bilaterally at all 3 levels.   PROCEDURE:  Under general anesthesia, routine orthopedic prep and drape in  the lower back was carried out.  At this time the patient had 1 gram of IV  Ancef.  Two needles were placed in the back for localization purposes.  An x-  ray was taken.  At this time an incision was made over the lower lumbar  spine.  Bleeders were identified and cauterized.  The self-retaining  retractors were inserted.  The muscle was stripped from the spinous process  and lamina at all 3 levels.  Another x-ray was taken to verify our position.  We then went on with the double-action rongeur and removed a portion of the  spinous process of L2.  We removed the complete spinous process of L3 and L4  and we did a nice central decompressive lumbar laminectomy.  Note, the  ligamentum flavum was extremely thickened at L3-4 but more at L4-5.  We  removed that with great care by using the microscope and making sure that  we  had the dura protected at all times.  We then did foraminotomies at all 3  levels as well.  The wound was thoroughly irrigated with antibiotic  solution.  We went back and rechecked the foramina bilaterally with a hockey-  stick to make sure we had a nice decompression.  We thoroughly irrigated out  the area again. Following that the thrombin-soaked Gelfoam was loosely  applied.  We then closed the wound in layers in the usual fashion.  The  proximal and distal portions of the wound was proximally left open for  drainage purposes.  The skin was closed with metal staples.  Prior to  closing the wound I injected 20 mL 0.5% Marcaine and epinephrine into the  wound site.  Also gave her Toradol 30 mg IV at the end of the procedure.           ______________________________  Ronald A. Darrelyn Hillock, M.D.     RAG/MEDQ  D:  12/05/2005  T:  12/05/2005  Job:  [id number]

## 2010-11-10 NOTE — H&P (Signed)
[name], [name]               ACCOUNT NO.:  [id number]   MEDICAL RECORD NO.:  [id number]          PATIENT TYPE:  INP   LOCATION:  NA                           [facility]ACILITY:  [facility]   PHYSICIAN:  Georges Lynch. Gioffre, M.D.DATE OF BIRTH:  [birth year]   DATE OF ADMISSION:  12/05/2005  DATE OF DISCHARGE:                                HISTORY & PHYSICAL   CHIEF COMPLAINT:  Lower back and pain radiating to both buttocks region.   HISTORY OF PRESENT ILLNESS:  Ms. Vierling is a 70 year old female with lower  back pain with pain radiating to the bilateral buttocks with classic  intermittent claudication type complaints. The patient's evaluation included  a CT myelogram, which showed that she had significant spinal stenosis with  complete block at L3-4 with pseudo-spondylolisthesis at L3 on 4 and L4 on 5.  The patient has had multiple discussions with her husband and Dr. Darrelyn Hillock  about the surgical procedure and the patient is going to proceed with a  central decompression of the lumbar laminectomy at L3-4 and L4-5.   ALLERGIES:  NO KNOWN DRUG ALLERGIES.   CURRENT MEDICATIONS:  1.  Stalevo 150 mg 4 times a day.  2.  Amantadine 100 mg twice a day.  3.  Plavix 75 mg once daily.  4.  Aspirin 81 mg once daily.  5.  Lisinopril 10 mg once daily.  6.  Levothyroxine 88 mcg daily.  7.  Celebrex 200 mg daily.  8.  Xalatan eye drops 1 drop OU q. p.m.  9.  Multivitamins.   PAST MEDICAL HISTORY:  1.  Parkinson's disease.  2.  Hypertension.  3.  Arteriosclerosis.  4.  Peripheral vascular disease.  5.  History of diverticulosis.  6.  Borderline diabetes.  7.  Venous insufficiency.   PAST SURGICAL HISTORY:  1.  Gallbladder surgery.  2.  Ovarian cyst.  The patient states that she did have some issues after her last surgical  procedure in 2005 with post surgical hallucinations. The husband indicated  that it went on for about 6 months.   SOCIAL HISTORY:  The patient is married. Lives with her  husband. Does not  smoke or drink. Lives in a one story house.   FAMILY HISTORY:  Mother deceased from a CVA at 29 years of age. Father is  deceased from colon cancer at 72 years of age. The patient has got multiple  brothers and sisters. One brother has had a CVA.   PRIMARY CARE PHYSICIAN:  Alroy Dust, M.D.   OTHER PHYSICIAN'S:  Physician managing Parkinson's disease is Dr. Darnelle Maffucci at  [facility].   REVIEW OF SYSTEMS:  Positive for occasional heartburn and indigestion, for  which she uses Tums. She does have issues related to her Parkinson's. She  denies any other cardiac, respiratory, GI, GU, or hematologic issues.   PHYSICAL EXAMINATION:  GENERAL:  The patient is a 70 year old female who  appears to be in no acute distress. Fairly gets herself on and off the  examination table. Walks in very slow, deliberate steps.  VITAL SIGNS:  Height is 5 foot even. Weight  130 pounds. Blood pressure  124/72. Pulse of 80 and regular. Respiratory rate 12. The patient is  afebrile.  HEENT:  Head is normocephalic. Pupils are equal, round, and reactive. Gross  hearing is intact.  NECK:  Supple. No palpable lymphadenopathy. Thyroid region was non-tender.  She had good range of motion  of her cervical spine.  CHEST:  Lungs sounds were clear and equal bilaterally. No wheezes or rales  or rhonchi.  HEART:  Regular rate and rhythm. No murmur, rub, or gallop.  ABDOMEN:  Soft. Bowel sounds present. No CVA region tenderness.  EXTREMITIES:  Upper extremities were symmetric in size and shape. She had  good range of motion  of her shoulders, elbows, and wrists with strong motor  strength. Lower extremities, right and left hip and full extension, flexion  up to 120 with 20 degrees internal and external rotation. Knees had full  extension and flexion back to 120 degrees. Ankles had full range of motion.  She had no calf tenderness.  NEUROLOGIC:  The patient was conscious, alert, and appropriate. She had slow   speech pattern but there were no other gross neurologic defects from the  orthopedic standpoint. She had good sensation lower extremities, equal deep  tendon reflexes right and left. Peripheral vascularly, she had good carotid  pulses, no bruits. Radial pulses were 2+ bilaterally. She had 1+ dorsalis  pedis pulses of bilateral lower extremities.  SKIN:  She had no pigmentation changes. No significant edema.  BREAST/RECTAL/GENITOURINARY:  Examinations deferred at this time.   IMPRESSION:  1.  Severe spinal stenosis at L3-4 and L4-5 with pseudo-spondylolisthesis at      L3 on 4 and L4 on 5.  2.  History of hypertension.  3.  History of arteriosclerosis with peripheral vascular disease.  4.  History of diverticulosis.  5.  History of borderline diabetes.  6.  History of venous insufficiency.  7.  History of Parkinson's disease.   PLAN:  The patient has been evaluated by Dr. Kriste Basque and he has okayed her for  upcoming surgical procedures. We will have her evaluated preoperatively by  anesthesia and she will undergo all routine labs and tests, prior to having  a  central decompression by Dr. Darrelyn Hillock at L3-4 and L4-5. The patient did have  multiple discussions with Dr. Darrelyn Hillock about the upcoming surgical procedure.  Multiple asked questions were answered. The patient was comfortable with the  current plan. No further questions.      Jamelle Rushing, P.A.    ______________________________  Georges Lynch Darrelyn Hillock, M.D.    RWK/MEDQ  D:  11/29/2005  T:  11/29/2005  Job:  [id number]

## 2010-11-10 NOTE — Op Note (Signed)
NAME:  [name]                         ACCOUNT NO.:  [id number]   MEDICAL RECORD NO.:  [id number]                   PATIENT TYPE:  OBV   LOCATION:  0376                                 [facility]ACILITY[facility]   PHYSICIAN:  Angelia Mould. Derrell Lolling, M.D.             DATE OF BIRTH:  04/24/1941   DATE OF PROCEDURE:  10/14/2003  DATE OF DISCHARGE:                                 OPERATIVE REPORT   PREOPERATIVE DIAGNOSIS:  Chronic cholecystitis with cholelithiasis.   POSTOPERATIVE DIAGNOSIS:  Chronic cholecystitis with cholelithiasis.   OPERATION/PROCEDURE:  Laparoscopic cholecystectomy with intraoperative  cholangiogram.   SURGEON:  Angelia Mould. Derrell Lolling, M.D.   FIRST ASSISTANT:  Timothy E. Earlene Plater, M.D.   OPERATIVE INDICATIONS:  This is a 70 year old black female with diabetes,  hyperthyroidism and Parkinson's disease.  She has had a little bit of weight  loss and diminished appetite over the past few months.  Lab work was  obtained in January which showed mildly elevated liver function tests.  An  ultrasound showed multiple gallstones.  She has really not had any specific  episodes of biliary colic, just minor indigestion.  Because of her diabetes  and abnormal liver function tests, elective cholecystectomy was recommended.  She is brought to the operating room electively.   OPERATIVE FINDINGS:  Gallbladder was discolored and chronically inflamed.  The anatomy of the cystic duct, cystic artery and common bile duct were  conventional.  The cholangiogram was normal.  There were no filling defects,  no obstruction and normal anatomy.  The liver looked fine.  The stomach and  duodenum, large intestine and small intestine were grossly normal.  The  uterus appeared enlarged secondary to fibroids.   DESCRIPTION OF PROCEDURE:  Following the induction of general endotracheal  anesthesia, the patient's abdomen was prepped and draped in the sterile  fashion.  Marcaine 0.25% with epinephrine was used  as a local infiltration  anesthetic.  A transverse incision was made at the superior rim of the  umbilicus.  The fascia was incised in the midline and the abdominal cavity  entered under direct vision.  A 10 mm Hasson trocar was inserted and secured  with a pursestring of 0 Vicryl.  Pneumoperitoneum was created.  Video camera  was inserted with visualization and findings as described above.   A 10 mm trocar was placed in the subxiphoid region and two 5 mm trocars were  placed in the right mid abdomen.  The fundus of the gallbladder was  elevated.  Adhesions were taken down.  I dissected out the lymph node of  [place] and sent that to the lab for routine histology.  I dissected out the  cystic duct and the cystic artery.  The cystic duct and the cystic artery  were traveling very close to one another.  I created a large window behind  these structures.  I then separated the cystic artery from the cystic duct  and secured it with metal clips as it went onto the gallbladder wall and  then divided the cystic artery.  I then stripped the artery down a little  bit.  I placed a clip on the cystic duct close to the gallbladder.  A  cholangiogram catheter was inserted into the cystic duct.  A cholangiogram  was obtained using the C-arm.  The cholangiogram showed normal intrahepatic  and extrahepatic biliary anatomy, no filling defects, and prompt flow of  contrast into the duodenum.  Dr. Colonel Bald read the film and agreed.   The cholangiogram catheter was removed.  The cystic duct was secured with  multiple metal clips and divided.  The gallbladder was dissected from its  bed with electrocautery and removed through the umbilical port.  The  operative field was copiously irrigated.  There was no bleeding and no bile  leak whatsoever at the completion of the case.  The irrigation fluid was  completely clear.  The trocars were removed under direct vision.  There was  no bleeding from the trocar  sites.  The pneumoperitoneum was released.  The  fascia at the umbilicus was closed with 0 Vicryl suture.  Skin incisions  were closed with subcuticular sutures of 4-0 Vicryl and Steri-Strips.  Kling  bandages were placed.  The patient was taken to the recovery room in stable  condition.  Estimated blood loss was about 10 cc.  Complications none.  Sponge and needle counts were correct.                                               Angelia Mould. Derrell Lolling, M.D.    HMI/MEDQ  D:  10/14/2003  T:  10/15/2003  Job:  [id number]   cc:   Lonzo Cloud. Kriste Basque, M.D. [facility]   Margaretmary Bayley, M.D.  [address]  [place]  [place] [address]  Fax: [phone]

## 2010-11-15 ENCOUNTER — Other Ambulatory Visit: Payer: Federal, State, Local not specified - PPO | Admitting: Gastroenterology

## 2010-11-29 ENCOUNTER — Encounter: Payer: Self-pay | Admitting: *Deleted

## 2010-11-29 ENCOUNTER — Ambulatory Visit (AMBULATORY_SURGERY_CENTER): Payer: Medicare Other | Admitting: *Deleted

## 2010-11-29 ENCOUNTER — Telehealth: Payer: Self-pay | Admitting: *Deleted

## 2010-11-29 VITALS — Ht 60.0 in | Wt 125.0 lb

## 2010-11-29 DIAGNOSIS — Z86010 Personal history of colonic polyps: Secondary | ICD-10-CM

## 2010-11-29 MED ORDER — PEG-KCL-NACL-NASULF-NA ASC-C 100 G PO SOLR: ORAL | Status: DC

## 2010-11-29 NOTE — Telephone Encounter (Signed)
Per Dr. Luana Shu for pt to stop the plavix  5-7 days prior to procedure.  thanks

## 2010-11-29 NOTE — Telephone Encounter (Signed)
Message copied by Leonette Monarch on Wed Nov 29, 2010 10:45 AM ------      Message from: Sarina Ill ANN      Created: Wed Nov 29, 2010 10:25 AM      Regarding: Plavix order       [name] was scheduled for her PV and she's on Plavix.  She said Dr. Kriste Basque orders it for her.  She will need an order to stop it prior to her colon on 12/13/10.  Thanks Wyona Almas

## 2010-11-29 NOTE — Telephone Encounter (Signed)
Dr Kriste Basque patient scheduled for Colonoscopy and I have sent you a coag letter. Please review and advised if she can hold plavix and how long.

## 2010-11-29 NOTE — Telephone Encounter (Signed)
Called and advised pt to hold Plavix starting on 12/07/2010, she verbalized understanding

## 2010-12-05 ENCOUNTER — Telehealth: Payer: Self-pay | Admitting: *Deleted

## 2010-12-05 NOTE — Telephone Encounter (Signed)
Advised patient that per Dr Kriste Basque she can hold her Plavix for 5 days, she verbalized understanding.

## 2010-12-06 ENCOUNTER — Encounter (HOSPITAL_BASED_OUTPATIENT_CLINIC_OR_DEPARTMENT_OTHER): Payer: Medicare Other | Admitting: Oncology

## 2010-12-06 ENCOUNTER — Other Ambulatory Visit: Payer: Self-pay | Admitting: Pulmonary Disease

## 2010-12-06 DIAGNOSIS — D0590 Unspecified type of carcinoma in situ of unspecified breast: Secondary | ICD-10-CM

## 2010-12-11 ENCOUNTER — Other Ambulatory Visit: Payer: Self-pay | Admitting: Pulmonary Disease

## 2010-12-13 ENCOUNTER — Ambulatory Visit (AMBULATORY_SURGERY_CENTER): Payer: Medicare Other | Admitting: Gastroenterology

## 2010-12-13 ENCOUNTER — Encounter: Payer: Self-pay | Admitting: Gastroenterology

## 2010-12-13 VITALS — BP 126/63 | HR 62 | Temp 97.8°F | Resp 18 | Ht 60.0 in | Wt 125.0 lb

## 2010-12-13 DIAGNOSIS — Z860100 Personal history of colon polyps, unspecified: Secondary | ICD-10-CM

## 2010-12-13 DIAGNOSIS — K5730 Diverticulosis of large intestine without perforation or abscess without bleeding: Secondary | ICD-10-CM

## 2010-12-13 DIAGNOSIS — Z800 Family history of malignant neoplasm of digestive organs: Secondary | ICD-10-CM

## 2010-12-13 LAB — HM COLONOSCOPY: HM Colonoscopy: NORMAL

## 2010-12-13 MED ORDER — SODIUM CHLORIDE 0.9 % IV SOLN: 500.0000 mL | INTRAVENOUS | Status: DC

## 2010-12-13 NOTE — Patient Instructions (Signed)
Please refer to blue and green discharge instruction sheets. 

## 2010-12-14 ENCOUNTER — Telehealth: Payer: Self-pay | Admitting: *Deleted

## 2010-12-14 NOTE — Telephone Encounter (Signed)

## 2011-01-24 ENCOUNTER — Other Ambulatory Visit: Payer: Self-pay | Admitting: Pulmonary Disease

## 2011-01-31 ENCOUNTER — Encounter: Payer: Self-pay | Admitting: Pulmonary Disease

## 2011-03-06 ENCOUNTER — Ambulatory Visit (INDEPENDENT_AMBULATORY_CARE_PROVIDER_SITE_OTHER): Payer: Medicare Other | Admitting: General Surgery

## 2011-03-06 ENCOUNTER — Encounter (INDEPENDENT_AMBULATORY_CARE_PROVIDER_SITE_OTHER): Payer: Self-pay | Admitting: General Surgery

## 2011-03-06 VITALS — BP 102/68 | HR 70 | Temp 97.3°F | Ht 60.0 in | Wt 123.5 lb

## 2011-03-06 DIAGNOSIS — L723 Sebaceous cyst: Secondary | ICD-10-CM

## 2011-03-06 DIAGNOSIS — Z853 Personal history of malignant neoplasm of breast: Secondary | ICD-10-CM

## 2011-03-06 NOTE — Progress Notes (Signed)
Subjective:     Patient ID: [name], female   DOB: [birth year], 70 y.o.   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 She was then treated with radiation therapy. She was not treated with any antiestrogen therapy. She doing well from her breast standpoint and has no complaints referable to her breasts. She underwent a normal mammogram it was BI-RADS 2 in July and is due for followup in July 2013. She reports no discharge or any tenderness in her breasts.  I saw her last in May of 2012 for  a mass in her left upper back. We discussed observation remove it at that point she chose to observe this which I think is reasonable. She would now like to have it evaluated to be removed. She has no history of infection of this area.  Review of Systems     Objective:   Physical Exam  Pulmonary/Chest: Right breast exhibits no inverted nipple, no mass, no nipple discharge, no skin change and no tenderness. Left breast exhibits no inverted nipple, no mass, no nipple discharge, no skin change and no tenderness. Breasts are symmetrical.    Lymphadenopathy:    She has no cervical adenopathy.    She has no axillary adenopathy.   Left upper back 2 x 2 cm subq sebaceous cyst, no evidence infection     Assessment:     History of stage 0 left breast cancer Back sebaceous cyst    Plan:     She is doing well from her stage her left breast cancer. Were described to continue exams as well as yearly mammograms.  We again discussed observation versus excising the sebaceous cyst in her back. She would like to have this area excised now. We discussed doing this in the office versus over a day surgery. I think she should be fine to have  this removed in the office and we discussed excision of the cyst with the risks being infection as well as recurrence. We will plan on calling her with a day.

## 2011-03-07 ENCOUNTER — Encounter (INDEPENDENT_AMBULATORY_CARE_PROVIDER_SITE_OTHER): Payer: Self-pay | Admitting: General Surgery

## 2011-03-20 ENCOUNTER — Telehealth: Payer: Self-pay | Admitting: Pulmonary Disease

## 2011-03-20 NOTE — Telephone Encounter (Signed)
lmomtcb for [name] to advise Dr. Kriste Basque has not received fax

## 2011-03-21 NOTE — Telephone Encounter (Signed)
lmomtcb for [name] 

## 2011-03-22 NOTE — Telephone Encounter (Signed)
lmomtcb for alisha 

## 2011-03-23 NOTE — Telephone Encounter (Signed)
Spoke with [employer]. I advised we never received this fax and so she will refax today. I gave her the fax number up front.

## 2011-04-05 ENCOUNTER — Telehealth (INDEPENDENT_AMBULATORY_CARE_PROVIDER_SITE_OTHER): Payer: Self-pay

## 2011-04-05 ENCOUNTER — Telehealth: Payer: Self-pay | Admitting: Pulmonary Disease

## 2011-04-05 NOTE — Telephone Encounter (Signed)
Called to check on the status of medical clearance with Dr Jodelle Green office. The front desk will leave a message for the nurse.Hulda Humphrey

## 2011-04-05 NOTE — Telephone Encounter (Signed)
Called and spoke with [name] at Dr. Doreen Salvage office and have requested she refax surgical clearance paperwork to triage fax #.  Awaiting fax.  [name] aware SN out of office this week and will return on Monday 10/15  Received fax and gave to [facility] for SN to review once he returns to the office.

## 2011-04-11 ENCOUNTER — Telehealth (INDEPENDENT_AMBULATORY_CARE_PROVIDER_SITE_OTHER): Payer: Self-pay

## 2011-04-11 NOTE — Telephone Encounter (Signed)
Leigh, did you ever receive paperwork on this patient? Pls advise.

## 2011-04-11 NOTE — Telephone Encounter (Signed)
Tried calling pt but no answer to let her know we received clearance with instructions about her Plavix so we can get her scheduled for a office surgery to have her seb.cyst removed off of her back./ [facility]

## 2011-04-12 ENCOUNTER — Telehealth (INDEPENDENT_AMBULATORY_CARE_PROVIDER_SITE_OTHER): Payer: Self-pay

## 2011-04-12 NOTE — Telephone Encounter (Signed)
Called pt again to notify her that we did receive clearance from Dr Kriste Basque about getting her scheduled for the cyst removal in the office by Dr Dwain Sarna. The pt has decided to wait for now on scheduling this b/c she has changed her mind about having it removed. I advised her that if she changed her mind again about having it removed to just call us back and we would be glad to work on getting scheduled. I did remind her that I would just have to have enough time to inform her of when to stop her Plavix. The pt understands and will call us back if needed.Hulda Humphrey

## 2011-04-13 NOTE — Telephone Encounter (Signed)
It is benign.  I told her could observe and that is reasonable.

## 2011-04-13 NOTE — Telephone Encounter (Signed)
Letter printed out from [facility] and last ov note and placed on SN cart for review.

## 2011-04-16 NOTE — Telephone Encounter (Signed)
Per SN---ok for surgery.  Last ov note has been faxed back to [facility] to [phone]  Attn alisha.

## 2011-05-03 ENCOUNTER — Other Ambulatory Visit: Payer: Self-pay | Admitting: Diagnostic Neuroimaging

## 2011-05-08 ENCOUNTER — Ambulatory Visit
Admission: RE | Admit: 2011-05-08 | Discharge: 2011-05-08 | Disposition: A | Discharge status: Home / Self Care | Payer: Medicare Other | Source: Ambulatory Visit | Attending: Diagnostic Neuroimaging | Admitting: Diagnostic Neuroimaging

## 2011-05-11 ENCOUNTER — Other Ambulatory Visit: Payer: Medicare Other

## 2011-05-11 ENCOUNTER — Encounter: Payer: Self-pay | Admitting: Pulmonary Disease

## 2011-05-11 ENCOUNTER — Ambulatory Visit (INDEPENDENT_AMBULATORY_CARE_PROVIDER_SITE_OTHER): Payer: Medicare Other | Admitting: Pulmonary Disease

## 2011-05-11 DIAGNOSIS — M1990 Unspecified osteoarthritis, unspecified site: Secondary | ICD-10-CM

## 2011-05-11 DIAGNOSIS — I872 Venous insufficiency (chronic) (peripheral): Secondary | ICD-10-CM

## 2011-05-11 DIAGNOSIS — G20 Parkinson's disease: Secondary | ICD-10-CM

## 2011-05-11 DIAGNOSIS — M545 Low back pain, unspecified: Secondary | ICD-10-CM

## 2011-05-11 DIAGNOSIS — Z23 Encounter for immunization: Secondary | ICD-10-CM

## 2011-05-11 DIAGNOSIS — E039 Hypothyroidism, unspecified: Secondary | ICD-10-CM

## 2011-05-11 DIAGNOSIS — M899 Disorder of bone, unspecified: Secondary | ICD-10-CM

## 2011-05-11 DIAGNOSIS — F411 Generalized anxiety disorder: Secondary | ICD-10-CM

## 2011-05-11 DIAGNOSIS — E7800 Pure hypercholesterolemia, unspecified: Secondary | ICD-10-CM

## 2011-05-11 DIAGNOSIS — K5730 Diverticulosis of large intestine without perforation or abscess without bleeding: Secondary | ICD-10-CM

## 2011-05-11 DIAGNOSIS — I10 Essential (primary) hypertension: Secondary | ICD-10-CM

## 2011-05-11 DIAGNOSIS — G20A1 Parkinson's disease without dyskinesia, without mention of fluctuations: Secondary | ICD-10-CM

## 2011-05-11 DIAGNOSIS — C50919 Malignant neoplasm of unspecified site of unspecified female breast: Secondary | ICD-10-CM

## 2011-05-11 DIAGNOSIS — I739 Peripheral vascular disease, unspecified: Secondary | ICD-10-CM

## 2011-05-11 NOTE — Progress Notes (Signed)
Subjective:    Patient ID: [name], female    DOB: Jul 24, 1940, 70 y.o.   MRN: [id number]  HPI 70 y/o BF here for a follow up visit... she has mult med problems as noted below...   ~  Nov 18, 2009:  she's had several eye probs treated by DrHecker (glaucoma, cats, dry eyes) & had laser Rx...  BP has been controlled & feeling well x for minor somatic complaints... wants FASTING blood work, and referral to Scientist, research (medical) as she doesn't want to make the trip to [facility] to see DrStacey for f/u Parkinson's dis...  ~  May 10, 2010:  she saw DrPenumalli for Neuro> Parkinson's dis & Rx w/ Stalevo 150- 5times daily (hx hallucinations on the 200 tabs), no changes made... she was diagnosed w/ left breast DCIS & had lumpectomy by DrWakefield 8/11, then Oncology eval DrKhan w/ rec for Tamoxifen but she refused- therefore had 20 XRT treatments from [facility]...   ~  Nov 08, 2010:  62mo ROV & doing well overall>  Worried about a "mole" on her back but exam shows mod sized sebaceous cyst & will need excision by DrWakefield when she is ready;  She has a f/u colonoscopy sched for 6/12 w/ DrPatterson & an Oncology f/u w/ Andi Hence next week;  She requests refills for 90d supplies, CXR today, & she will return for fasting labs in the AM> see prob list below:  ~  May 12, 2011:  62mo ROV & she has had a recent Neuro eval DrPenumali for hallucinations- seeing people who were not there, on Stalevo 5/d per Neuro & they cut back to 4/d;  She stopped her calcium & Vit D since they are both in her MVI but I explained the need for additional supplementation above & beyond the min requirements & she will restart Calcium & at least 2000u Vit D...  She remains stable on ASA/ Plavix w/o cerebral ischemic symptoms;  BP controlled, thyroid stable, etc...  See prob list below>>          Problem List:  GLAUCOMA (ICD-365.9) - on eye drops daily DrHecker and she reports cataract surg 2010, and Laser Rx 2011> followed for  Glaucoma, s/p cats, dry eyes... ~  6/10:  eye exam by DrHecker neg for retinopathy or macular changes... ~  4/11: Argon laser trabeculoplasty planned by DrHecker.  ABNORMAL CHEST XRAY (ICD-793.1) - she has chr right hemidiaphragm elevation & no changes serially...  ~  CXR 5/09 w/ elev right hemidiaph, no change. ~  CXR 5/11 showed elev right hemidiaph & scarring right base, NAD. ~  CXR 5/12 showed elev right hemidiaph & scarring right base, NAD...  HYPERTENSION (ICD-401.9) - on LISINOPRIL 10mg /d w/ good control...  ~  11/11: BP= 120/82> feeling well and denies HA, fatigue, visual changes, CP, palipit, dizziness, syncope, dyspnea, edema, etc... ~  5/12:  BP= 110/70> remains asymptomatic...  PERIPHERAL VASCULAR DISEASE (ICD-443.9) - on ASA 81mg /d & PLAVIX 75mg /d... she is asymptomatic w/o focal weakness, sensory changes, slurring, etc...  VENOUS INSUFFICIENCY (ICD-459.81) - on low sodium diet, etc... denies swelling...  HYPERCHOLESTEROLEMIA (ICD-272.0) - on diet rx alone... ~  FLP 5/08 showed TChol 217, TG 66, HDL 58, LDL 130... ~  FLP 5/09 showed TChol 194, TG 74, HDL 53, LDL 127... she prefers diet alone... ~  FLP 5/10 showed TChol 218, TG 60, HDL 62, LDL 142... still prefers diet + FishOil rec. ~  FLP 5/11 showed TChol 193, TG 49, HDL 63, LDL  120... improved, continue diet, get wt down. ~  FLP 5/12 showed TChol 162, TG 54, HDL 60, LDL 91  GLUCOSE INTOLERANCE, BORDERLINE (ICD-790.29) - on diet rx alone & doing very well... ~  labs 5/08 showed BS=99, HGA1c=5.9 ~  labs 5/09 showed BS= 90, HgA1c= 5.9 ~  labs 5/10 showed BS= 92, A1c= 5.7 ~  labs 5/11 (wt= 126#) showed BS= 92 ~  Labs 5/12 (wt=130#) showed BS= 75  HYPOTHYROIDISM (ICD-244.9) - on SYNTHROID 55mcg/d... Hx HYPERTHYROIDISM w/ I-131 rx 9/05...  ~  labs 5/08 on Synthroid 45mcg/d showed TSH= 0.73... keep same. ~  labs 5/09 on Synth88 showed TSH= 0.72 ~  labs 5/10 on Synth88 showed TSH= 0.49 ~  labs 5/11 on Synth88 showed TSH=  0.28... rec> decr dose 38mcg/d (she is concerned about "facial hair") ~  labs 11/11 on Synthroid 45mcg/d showed TSH= 3.92... keep same. ~  Labs 5/12 on Synth75 showed TSH= 1.89  DIVERTICULOSIS OF COLON (ICD-562.10) - there is a +fam hx of colon cancer in her father... ~  last colonoscopy 8/06 by DrPatterson showed divertics, hems... f/u 64yrs. ~  F/u colonoscopy 6/12 by DrPatterson showed divertics, otherw neg...  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 ~  She continues periodic f/u evals by DrWakefield, DrMoody, & DrKhan==> on observation alone now...  DEGENERATIVE JOINT DISEASE (ICD-715.90) & HIP PAIN (ICD-719.45) - she takes calcium & vitamins (including "Nature's Code which has 1,200 u Vit D)... prev on Celebrex & Tramadol but uses OTC meds now as needed... she tells me she had a BMD at DrBertrand's in 2008 per her GYN. ~  12/10: presents w/ right hip pain> XRays neg; Rx w/ Dosepak, Mobic, rest, heat... ~  11/11:  c/o left hip pain & XRay showed mild degen changes> Rx MOBIC 7.5mg  Prn...  BACK PAIN, LUMBAR (ICD-724.2) - she is s/p lumbar laminectomy 6/07 by DrGioffre for spinal stenosis... ~  Lower spine degen changes seen on f/u XRays...  OSTEOPENIA (ICD-733.90) - she had a normal BMD in 2000- ?when her last BMD was done?... Vit D level was 24 May09 and started on 50,000 u weekly... f/u Vit D level 11/09 = 47 & switched to OTC Vit D supplement. ~  labs 5/11 showed Vit D level = 29... rec incr VIt D to 2000 u daily. ~  She is encouraged to get a follow up BMD when she has her next mammogram & will let us know...  PARKINSON'S DISEASE (ICD-332.0) - she has mod severe Parkinson's dis followed by DrPenumalli & DrStacey at [facility] on STELEVO 150mg -  5 times daily... she has been doing well & very stable on this medication... ~  5/11: she requests referral to [facility] for f/u Parkinson's> seen by DrPenumalli & no changes made. ~  11/12:  She reports recent eval by [facility] for hallucinations> MRI pending & they decr her Stavelo from 5 to 4/d...  ANXIETY (ICD-300.00) - prev on Prn Valium but off this medication now...  SEBACEOUS CYST>  She mentioned a "mole" on her back during 5/12 OV & exam revealed a mod sized seb cyst; rec to ask DrWakfield to excise this at his convenience==> removed 9/12.   Past Surgical History  Procedure Date  .  Ovarian cyst removal   . Lapraoscopic cholecystectomy 09/2003    Dr. Derrell Lolling  . Lumbar laminectomy and foraminotomies for spinal stenosis 11/2005    Dr. Darrelyn Hillock  . Cataract surgery and argon laser trabeculoplasty 04/2010    Dr. Elmer Picker  . Colonoscopy   . Polypectomy   . Cholecystectomy   . Breast lumpectomy 8/11 by DrWakefield    Outpatient Encounter Prescriptions as of 05/11/2011  Medication Sig Dispense Refill  . aspirin 81 MG tablet Take 81 mg by mouth daily.        . carbidopa-levodopa-entacapone (STALEVO 150) 37.5-150-200 MG per tablet Take 1 tablet by mouth 5 (five) times daily as needed. As directed by Dr. Misty Stanley       . cycloSPORINE (RESTASIS) 0.05 % ophthalmic emulsion Place 1 drop into both eyes 2 (two) times daily.        Marland Kitchen levothyroxine (SYNTHROID, LEVOTHROID) 75 MCG tablet TAKE 1 TABLET EVERY DAY  60 tablet  6  . lisinopril (PRINIVIL,ZESTRIL) 10 MG tablet TAKE 1 TABLET EVERY DAY  60 tablet  5  . meloxicam (MOBIC) 7.5 MG tablet Take 7.5 mg by mouth 2 (two) times daily as needed.        . Multiple Vitamins-Minerals (WOMENS MULTIVITAMIN PLUS PO) Take 1 tablet by mouth daily.        Marland Kitchen PLAVIX 75 MG tablet TAKE 1 TABLET EVERY DAY  60 tablet  5  . Calcium Carbonate-Vitamin D (CALTRATE 600+D) 600-400 MG-UNIT per tablet Take 1 tablet by mouth 2 (two) times daily.        .  Cholecalciferol (VITAMIN D) 2000 UNITS CAPS Take 1 capsule by mouth daily.         Facility-Administered Encounter Medications as of 05/11/2011  Medication Dose Route Frequency Provider Last Rate Last Dose  . 0.9 %  sodium chloride infusion  500 mL Intravenous Continuous Sheryn Bison, MD        No Known Allergies   Current Medications, Allergies, Past Medical History, Past Surgical History, Family History, and Social History were reviewed in [employer] record.   Review of Systems        See HPI - all other systems neg except as noted... The patient complains of dyspnea on exertion, muscle weakness, and difficulty walking.  The patient denies anorexia, fever, weight loss, weight gain, vision loss, decreased hearing, hoarseness, chest pain, syncope, peripheral edema, prolonged cough, headaches, hemoptysis, abdominal pain, melena, hematochezia, severe indigestion/heartburn, hematuria, incontinence, suspicious skin lesions, transient blindness, depression, unusual weight change, abnormal bleeding, enlarged lymph nodes, and angioedema.     Objective:   Physical Exam    WD, WN, 70 y/o BF in NAD.Marland KitchenMarland Kitchen slow moving but only min tremor from Parkinsons... GENERAL:  Alert & oriented; pleasant & cooperative... HEENT:  [place]/AT, EOM-wnl, PERRLA, EACs-clear, TMs-wnl, NOSE-clear, THROAT-clear & wnl. NECK:  Supple w/ fairROM; no JVD; normal carotid impulses w/o bruits; no thyromegaly or nodules palpated; no lymphadenopathy. CHEST:  Clear to P & A; without wheezes/ rales/ or rhonchi. HEART:  Regular Rhythm; without murmurs/ rubs/ or gallops. ABDOMEN:  Soft & nontender; normal bowel sounds; no organomegaly or masses detected. EXT: without deformities, mod arthritic changes; no varicose veins/ +venous insuffic/ no edema. fairly good ROM in hips etc... NEURO:  CN's intact; min tremor and bradykinesia of Parkinson's; no focal neuro deficits... DERM:  No lesions noted; no rash  etc...   Assessment & Plan:   HBP>  Stable on Lisinopril, tolerating well, continue same...  ASPVD>  Stable on ASA/ Plavix & denies cerebral ischemic symptoms, she does not want to change meds...  CHOL>  Her lipids are good on diet alone; continue diet efforts...  DM>  Borderline at worst, her BS & A1c has been normal x yrs...  Hypothyroidism>  Clinically stable on 22mcg/d...  GI>  Follow up colon was neg & she is stable...  BREAST CANCER>  She had left breast DCIS excised 8/11 w/ post op XRT as noted; refused Tamoxifen, followed by Andi Hence...  DJD>  She tells me she still managed quite satis on OTC analgesics...  Osteopenia>  Vit D level is 31 & she is rec totake 2000u extra every day; we will sched BMD here...  Parkinsons>  She is stable w/ mod severe dis on the Stalevo Rx; but this has beed weaned from 5/d to 4/d due to hallucinations by DrPenumali.Marland KitchenMarland Kitchen

## 2011-05-11 NOTE — Patient Instructions (Signed)
Today we updated your med list in our EPIC system...    Continue your current medications the same...  Today we did your follow up blood work & checked your Vit D level...    Please call the [facility]REE in a few days for your results...    Dial [id number] & when prompted enter your patient number followed by the # symbol...    Your patient number is:  [id number]#  We will arrange for a Bone Density test to be done here in our office, and we will call you w/ the results when avail...    You should be taking a calcium supplement, a multivitamin, and an extra 2000u of Vit D daily...  Call for any questions...  Let's plan a follow up visit in 6 months & it will be time for your FASTING blood work at that visit.Marland KitchenMarland Kitchen

## 2011-05-12 LAB — VITAMIN D 25 HYDROXY (VIT D DEFICIENCY, FRACTURES): Vit D, 25-Hydroxy: 31 ng/mL (ref 30–89)

## 2011-05-19 ENCOUNTER — Encounter: Payer: Self-pay | Admitting: Pulmonary Disease

## 2011-05-22 ENCOUNTER — Telehealth: Payer: Self-pay | Admitting: Pulmonary Disease

## 2011-05-22 ENCOUNTER — Ambulatory Visit (INDEPENDENT_AMBULATORY_CARE_PROVIDER_SITE_OTHER)
Admission: RE | Admit: 2011-05-22 | Discharge: 2011-05-22 | Disposition: A | Discharge status: Home / Self Care | Payer: Medicare Other | Source: Ambulatory Visit

## 2011-05-22 DIAGNOSIS — M949 Disorder of cartilage, unspecified: Secondary | ICD-10-CM

## 2011-05-22 DIAGNOSIS — M899 Disorder of bone, unspecified: Secondary | ICD-10-CM

## 2011-05-22 NOTE — Telephone Encounter (Signed)
on Vit D 2000u daily & Vit D level is "low-nl"...  REC> continue 2000u daily & take it every day... I spoke with patient about results and she verbalized understanding and had no questions. Pt states she would like these results mailed to her. I advised pt will mail her the results. I confirmed pt's address.

## 2011-05-29 ENCOUNTER — Encounter: Payer: Self-pay | Admitting: Pulmonary Disease

## 2011-08-04 ENCOUNTER — Telehealth: Payer: Self-pay | Admitting: Oncology

## 2011-08-04 NOTE — Telephone Encounter (Signed)
S/w pt re appt foro 6/13.

## 2011-08-14 DIAGNOSIS — G20 Parkinson's disease: Secondary | ICD-10-CM | POA: Diagnosis not present

## 2011-09-13 DIAGNOSIS — H4011X0 Primary open-angle glaucoma, stage unspecified: Secondary | ICD-10-CM | POA: Diagnosis not present

## 2011-09-13 DIAGNOSIS — H04129 Dry eye syndrome of unspecified lacrimal gland: Secondary | ICD-10-CM | POA: Diagnosis not present

## 2011-09-13 DIAGNOSIS — H409 Unspecified glaucoma: Secondary | ICD-10-CM | POA: Diagnosis not present

## 2011-09-24 ENCOUNTER — Ambulatory Visit: Payer: Medicare Other | Attending: Diagnostic Neuroimaging | Admitting: Physical Therapy

## 2011-09-24 DIAGNOSIS — R269 Unspecified abnormalities of gait and mobility: Secondary | ICD-10-CM | POA: Insufficient documentation

## 2011-09-24 DIAGNOSIS — IMO0001 Reserved for inherently not codable concepts without codable children: Secondary | ICD-10-CM | POA: Diagnosis not present

## 2011-09-26 ENCOUNTER — Ambulatory Visit: Payer: Medicare Other | Admitting: Physical Therapy

## 2011-10-02 ENCOUNTER — Ambulatory Visit: Payer: Medicare Other | Admitting: Physical Therapy

## 2011-10-04 ENCOUNTER — Ambulatory Visit (INDEPENDENT_AMBULATORY_CARE_PROVIDER_SITE_OTHER): Payer: Medicare Other | Admitting: Surgery

## 2011-10-04 ENCOUNTER — Ambulatory Visit: Payer: Medicare Other | Admitting: Physical Therapy

## 2011-10-04 VITALS — BP 112/70 | HR 72 | Temp 97.4°F | Resp 16 | Ht 60.0 in | Wt 123.0 lb

## 2011-10-04 DIAGNOSIS — L723 Sebaceous cyst: Secondary | ICD-10-CM | POA: Diagnosis not present

## 2011-10-04 HISTORY — PX: INCISE AND DRAIN ABCESS: PRO64

## 2011-10-04 MED ORDER — SULFAMETHOXAZOLE-TRIMETHOPRIM 400-80 MG PO TABS: 1.0000 | ORAL_TABLET | Freq: Two times a day (BID) | ORAL | Status: AC

## 2011-10-04 NOTE — Patient Instructions (Signed)
WOUND CARE  It is important that the wound be kept open.   -Keeping the skin edges apart will allow the wound to gradually heal from the base upwards.   - If the skin edges of the wound close too early, a new fluid pocket can form and infection can occur. -This is the reason to pack deeper wounds with gauze or ribbon -This is why drained wounds cannot be sewed closed right away  A healthy wound should form a lining of bright red "beefy" granulating tissue that will help shrink the wound and help the edges grow new skin into it.   -A little mucus / yellow discharge is normal (the body's natural way to try and form a scab) and should be gently washed off with soap and water with daily dressing changes.  -Green or foul smelling drainage implies bacterial colonization and can slow wound healing - a short course of antibiotic ointment (3-5 days) can help it clear up.  Call the doctor if it does not improve or worsens  -Avoid use of antibiotic ointments for more than a week as they can slow wound healing over time.    -Sometimes other wound care products will be used to reduce need for dressing changes and/or help clean up dirty wounds -Sometimes the surgeon needs to debride the wound in the office to remove dead or infected tissue out of the wound so it can heal more quickly and safely.    Change the dressing at least once a day -Wash the wound with mild soap and water gently every day.  It is good to shower or bathe the wound to help it clean out. -Use clean 4x4 gauze for medium/large wounds or ribbon plain NU-gauze for smaller wounds (it does not need to be sterile, just clean) -Keep the raw wound moist with a little saline or KY (saline) gel on the gauze.  -A dry wound will take longer to heal.  -Keep the skin dry around the wound to prevent breakdown and irritation. -Pack the wound down to the base -The goal is to keep the skin apart, not overpack the wound -Use a Q-tip or blunt-tipped kabob  stick toothpick to push the gauze down to the base in narrow or deep wounds   -Cover with a clean gauze and tape -paper or Medipore tape tend to be gentle on the skin -rotate the orientation of the tape to avoid repeated stress/trauma on the skin -using an ACE or Coban wrap on wounds on arms or legs can be used instead.  Complete all antibiotics through the entire prescription to help the infection heal and prevent new places of infection   Returning the see the surgeon is helpful to follow the healing process and help the wound close as fast as possible.  We drained an infected : Epidermal Cyst An epidermal cyst is sometimes called a sebaceous cyst, epidermal inclusion cyst, or infundibular cyst. These cysts usually contain a substance that looks "pasty" or "cheesy" and may have a bad smell. This substance is a protein called keratin. Epidermal cysts are usually found on the face, neck, or trunk. They may also occur in the vaginal area or other parts of the genitalia of both men and women. Epidermal cysts are usually small, painless, slow-growing bumps or lumps that move freely under the skin. It is important not to try to pop them. This may cause an infection and lead to tenderness and swelling. CAUSES  Epidermal cysts may be caused by  a deep penetrating injury to the skin or a plugged hair follicle, often associated with acne. SYMPTOMS  Epidermal cysts can become inflamed and cause:  Redness.   Tenderness.   Increased temperature of the skin over the bumps or lumps.   Grayish-white, bad smelling material that drains from the bump or lump.  DIAGNOSIS  Epidermal cysts are easily diagnosed by your caregiver during an exam. Rarely, a tissue sample (biopsy) may be taken to rule out other conditions that may resemble epidermal cysts. TREATMENT   Epidermal cysts often get better and disappear on their own. They are rarely ever cancerous.   If a cyst becomes infected, it may become inflamed  and tender. This may require opening and draining the cyst. Treatment with antibiotics may be necessary. When the infection is gone, the cyst may be removed with minor surgery.   Small, inflamed cysts can often be treated with antibiotics or by injecting steroid medicines.   Sometimes, epidermal cysts become large and bothersome. If this happens, surgical removal in your caregiver's office may be necessary.  HOME CARE INSTRUCTIONS  Only take over-the-counter or prescription medicines as directed by your caregiver.   Take your antibiotics as directed. Finish them even if you start to feel better.  SEEK MEDICAL CARE IF:   Your cyst becomes tender, red, or swollen.   Your condition is not improving or is getting worse.   You have any other questions or concerns.  MAKE SURE YOU:  Understand these instructions.   Will watch your condition.   Will get help right away if you are not doing well or get worse.  Document Released: 05/12/2004 Document Revised: 05/31/2011 Document Reviewed: 12/18/2010 [facility] Patient Information 2012 [place], [place].

## 2011-10-05 NOTE — Progress Notes (Signed)
Subjective:     Patient ID: [name], female   DOB: 05/03/1941, 71 y.o.   MRN: [id number]  HPI  [facility]  [birth year] [id number]  Patient Care Team: Michele Mcalpine, MD as PCP - General (Pulmonary Disease)  This patient is a 71 y.o.female who presents today for surgical evaluation.   Reason for visit: Painful mass on back. Question of infected cyst.  Patient is a pleasant elderly female with numerous health issues. She's had a lump on her back. She was due to see my partner, Dr. Cleophas Dunker, to consider the area being removed. It became painful and more swollen. It began to drain last night. She called over concerns. No history of prior infections  Patient Active Problem List  Diagnoses  . BREAST CANCER  . HYPOTHYROIDISM  . VITAMIN D DEFICIENCY  . HYPERCHOLESTEROLEMIA  . ANXIETY  . PARKINSON'S DISEASE  . GLAUCOMA  . HYPERTENSION  . PERIPHERAL VASCULAR DISEASE  . VENOUS INSUFFICIENCY  . DIVERTICULOSIS OF COLON  . DEGENERATIVE JOINT DISEASE  . HIP PAIN  . BACK PAIN, LUMBAR  . OSTEOPENIA  . DIABETES MELLITUS, BORDERLINE  . Nonspecific (abnormal) findings on radiological and other examination of body structure  . ABNORMAL CHEST XRAY  . Infected sebaceous cyst, I&D 10/04/2011  . FH: colon cancer  . Personal history of colonic polyps    Past Medical History  Diagnosis Date  . Glaucoma   . Abnormal chest x-ray   . Hypertension   . Peripheral vascular disease   . Venous insufficiency   . Hypercholesteremia   . Hypothyroid   . Diverticulosis of colon   . DJD (degenerative joint disease)   . Hip pain   . Lumbar back pain   . Osteopenia   . Vitamin d deficiency   . Parkinson disease   . Anxiety   . Breast cancer     stage 0 left    Past Surgical History  Procedure Date  . Ovarian cyst removal   . Lapraoscopic cholecystectomy 09/2003    Dr. Derrell Lolling  . Lumbar laminectomy and foraminotomies for spinal stenosis 11/2005    Dr. Darrelyn Hillock  . Cataract surgery and argon  laser trabeculoplasty 04/2010    Dr. Elmer Picker  . Colonoscopy   . Polypectomy   . Cholecystectomy   . Breast lumpectomy 8/11 by DrWakefield    History   Social History  . Marital Status: Married    Spouse Name: Verdon Cummins    Number of Children: 2  . Years of Education: N/A   Occupational History  . retired   .     Social History Main Topics  . Smoking status: Never Smoker   . Smokeless tobacco: Never Used  . Alcohol Use: No  . Drug Use: No  . Sexually Active: Not on file   Other Topics Concern  . Not on file   Social History Narrative  . No narrative on file    Family History  Problem Relation Age of Onset  . Colon cancer Father     Current Outpatient Prescriptions  Medication Sig Dispense Refill  . aspirin 81 MG tablet Take 81 mg by mouth daily.        . Calcium Carbonate-Vitamin D (CALTRATE 600+D) 600-400 MG-UNIT per tablet Take 1 tablet by mouth 2 (two) times daily.        . carbidopa-levodopa-entacapone (STALEVO 150) 37.5-150-200 MG per tablet Take 1 tablet by mouth 5 (five) times daily as needed. As directed by Dr. Misty Stanley       .  Cholecalciferol (VITAMIN D) 2000 UNITS CAPS Take 1 capsule by mouth daily.        . cycloSPORINE (RESTASIS) 0.05 % ophthalmic emulsion Place 1 drop into both eyes 2 (two) times daily.        Marland Kitchen levothyroxine (SYNTHROID, LEVOTHROID) 75 MCG tablet TAKE 1 TABLET EVERY DAY  60 tablet  6  . lisinopril (PRINIVIL,ZESTRIL) 10 MG tablet TAKE 1 TABLET EVERY DAY  60 tablet  5  . meloxicam (MOBIC) 7.5 MG tablet Take 7.5 mg by mouth 2 (two) times daily as needed.        . Multiple Vitamins-Minerals (WOMENS MULTIVITAMIN PLUS PO) Take 1 tablet by mouth daily.        Marland Kitchen PLAVIX 75 MG tablet TAKE 1 TABLET EVERY DAY  60 tablet  5  . sulfamethoxazole-trimethoprim (BACTRIM,SEPTRA) 400-80 MG per tablet Take 1 tablet by mouth 2 (two) times daily.  14 tablet  2   Current Facility-Administered Medications  Medication Dose Route Frequency Provider Last Rate Last Dose   . 0.9 %  sodium chloride infusion  500 mL Intravenous Continuous Mardella Layman, MD         No Known Allergies  BP 112/70  Pulse 72  Temp(Src) 97.4 F (36.3 C) (Temporal)  Resp 16  Ht 5' (1.524 m)  Wt 123 lb (55.792 kg)  BMI 24.02 kg/m2     Review of Systems  Constitutional: Negative for fever, chills and diaphoresis.  HENT: Negative for ear pain, sore throat and trouble swallowing.   Eyes: Negative for photophobia and visual disturbance.  Respiratory: Negative for cough and choking.   Cardiovascular: Negative for chest pain and palpitations.  Gastrointestinal: Negative for nausea, vomiting, abdominal pain, diarrhea, constipation, anal bleeding and rectal pain.  Genitourinary: Negative for dysuria, frequency and difficulty urinating.  Musculoskeletal: Negative for myalgias and gait problem.  Skin: Positive for wound. Negative for color change, pallor and rash.  Neurological: Negative for dizziness, speech difficulty, weakness and numbness.  Hematological: Negative for adenopathy.  Psychiatric/Behavioral: Negative for confusion and agitation. The patient is not nervous/anxious.        Objective:   Physical Exam  Constitutional: She is oriented to person, place, and time. She appears well-developed and well-nourished. No distress.  HENT:  Head: Normocephalic.  Mouth/Throat: Oropharynx is clear and moist. No oropharyngeal exudate.  Eyes: Conjunctivae and EOM are normal. Pupils are equal, round, and reactive to light. No scleral icterus.  Neck: Normal range of motion. No tracheal deviation present.  Cardiovascular: Normal rate and intact distal pulses.   Pulmonary/Chest: Effort normal. No respiratory distress. She exhibits no tenderness.  Abdominal: Soft. She exhibits no distension. There is no tenderness. Hernia confirmed negative in the right inguinal area and confirmed negative in the left inguinal area.       Incisions clean with normal healing ridges.  No hernias    Genitourinary: No vaginal discharge found.  Musculoskeletal: Normal range of motion. She exhibits no tenderness.  Lymphadenopathy:       Right: No inguinal adenopathy present.       Left: No inguinal adenopathy present.  Neurological: She is alert and oriented to person, place, and time. No cranial nerve deficit. She exhibits normal muscle tone. Coordination normal.  Skin: Skin is warm and dry. No rash noted. She is not diaphoretic.     Psychiatric: She has a normal mood and affect. Her behavior is normal.       Assessment:     Infected sebaceous cyst.  Plan:     I went ahead and drained it. The cavity was 4 x 3 x 3 cm:  The pathophysiology of subcutaneous abscess and differential diagnosis was discussed.  Natural history progression was discussed.  The patient's symptoms are not adequately controlled.  Non-operative treatment has not healed the abscess.  Therefore, I recommended incision & drainage of the abscess to allow the infection to resolve and heal.  Technique, risks, benefits, alternatives discussed.  The patient expressed understanding & wished to proceed.  I placed a field block with local anaesthetic.  I incised the skin over the abscess to release the infection.  I excised skin at the wound to have an adequate opening for drainage & prevent skin reclosure.  I packed the wound with ribbon NU-Gauze.    The patient tolerated the procedure.  We will have the patient return to clinic for close follow up to make sure the infection heals.

## 2011-10-08 ENCOUNTER — Ambulatory Visit: Payer: Medicare Other | Admitting: Physical Therapy

## 2011-10-10 ENCOUNTER — Ambulatory Visit: Payer: Medicare Other | Admitting: Physical Therapy

## 2011-10-16 ENCOUNTER — Ambulatory Visit: Payer: Medicare Other | Admitting: Physical Therapy

## 2011-10-18 ENCOUNTER — Ambulatory Visit: Payer: Medicare Other | Admitting: Physical Therapy

## 2011-10-22 ENCOUNTER — Ambulatory Visit (INDEPENDENT_AMBULATORY_CARE_PROVIDER_SITE_OTHER): Payer: Medicare Other | Admitting: Surgery

## 2011-10-22 ENCOUNTER — Encounter (INDEPENDENT_AMBULATORY_CARE_PROVIDER_SITE_OTHER): Payer: Self-pay | Admitting: Surgery

## 2011-10-22 VITALS — BP 100/68 | HR 76 | Temp 97.6°F | Resp 16 | Ht 60.0 in | Wt 125.2 lb

## 2011-10-22 DIAGNOSIS — L723 Sebaceous cyst: Secondary | ICD-10-CM

## 2011-10-22 DIAGNOSIS — L089 Local infection of the skin and subcutaneous tissue, unspecified: Secondary | ICD-10-CM

## 2011-10-22 NOTE — Patient Instructions (Signed)
Epidermal Cyst An epidermal cyst is usually a small, painless lump under the skin. Cysts often occur on the face, neck, stomach, chest, or genitals. The cyst may be filled with a bad smelling paste. Do not pop your cyst. Popping the cyst can cause pain and puffiness (swelling). HOME CARE   Only take medicines as told by your doctor.   Take your medicine (antibiotics) as told. Finish it even if you start to feel better.  GET HELP RIGHT AWAY IF:  Your cyst is tender, red, or puffy.   You are not getting better, or you are getting worse.   You have any questions or concerns.  MAKE SURE YOU:  Understand these instructions.   Will watch your condition.   Will get help right away if you are not doing well or get worse.  Document Released: 07/19/2004 Document Revised: 05/31/2011 Document Reviewed: 12/18/2010 [employer] Patient Information 2012 [employer]. 

## 2011-10-22 NOTE — Progress Notes (Signed)
Subjective:     Patient ID: [name], female   DOB: [birth year], 71 y.o.   MRN: [id number]  HPI   [name]  Jul 25, 1940 [id number]  Patient Care Team: Michele Mcalpine, MD as PCP - General (Pulmonary Disease)  This patient is a 71 y.o.female who presents today for surgical evaluation.   Procedure: Incision and drainage of infected sebaceous cyst on back  The patient comes in today feeling well. The wound closed up. Finished antibiotics. No pain. No fevers chills or sweats  Patient Active Problem List  Diagnoses  . BREAST CANCER  . HYPOTHYROIDISM  . VITAMIN D DEFICIENCY  . HYPERCHOLESTEROLEMIA  . ANXIETY  . PARKINSON'S DISEASE  . GLAUCOMA  . HYPERTENSION  . PERIPHERAL VASCULAR DISEASE  . VENOUS INSUFFICIENCY  . DIVERTICULOSIS OF COLON  . DEGENERATIVE JOINT DISEASE  . HIP PAIN  . BACK PAIN, LUMBAR  . OSTEOPENIA  . DIABETES MELLITUS, BORDERLINE  . Nonspecific (abnormal) findings on radiological and other examination of body structure  . ABNORMAL CHEST XRAY  . Infected sebaceous cyst, I&D 10/04/2011  . FH: colon cancer  . Personal history of colonic polyps    Past Medical History  Diagnosis Date  . Glaucoma   . Abnormal chest x-ray   . Hypertension   . Peripheral vascular disease   . Venous insufficiency   . Hypercholesteremia   . Hypothyroid   . Diverticulosis of colon   . DJD (degenerative joint disease)   . Hip pain   . Lumbar back pain   . Osteopenia   . Vitamin d deficiency   . Parkinson disease   . Anxiety   . Breast cancer     stage 0 left    Past Surgical History  Procedure Date  . Ovarian cyst removal   . Lapraoscopic cholecystectomy 09/2003    Dr. Derrell Lolling  . Lumbar laminectomy and foraminotomies for spinal stenosis 11/2005    Dr. Darrelyn Hillock  . Cataract surgery and argon laser trabeculoplasty 04/2010    Dr. Elmer Picker  . Colonoscopy   . Polypectomy   . Cholecystectomy   . Breast lumpectomy 8/11 by DrWakefield  . Incise and drain abcess 10/04/11     sebaceous cyst on back    History   Social History  . Marital Status: Married    Spouse Name: Verdon Cummins    Number of Children: 2  . Years of Education: N/A   Occupational History  . retired   .     Social History Main Topics  . Smoking status: Never Smoker   . Smokeless tobacco: Never Used  . Alcohol Use: No  . Drug Use: No  . Sexually Active: Not on file   Other Topics Concern  . Not on file   Social History Narrative  . No narrative on file    Family History  Problem Relation Age of Onset  . Colon cancer Father     Current Outpatient Prescriptions  Medication Sig Dispense Refill  . aspirin 81 MG tablet Take 81 mg by mouth daily.        . Calcium Carbonate-Vitamin D (CALTRATE 600+D) 600-400 MG-UNIT per tablet Take 1 tablet by mouth 2 (two) times daily.        . carbidopa-levodopa-entacapone (STALEVO 150) 37.5-150-200 MG per tablet Take 1 tablet by mouth 5 (five) times daily as needed. As directed by Dr. Misty Stanley       . Cholecalciferol (VITAMIN D) 2000 UNITS CAPS Take 1 capsule by mouth daily.        Marland Kitchen  cycloSPORINE (RESTASIS) 0.05 % ophthalmic emulsion Place 1 drop into both eyes 2 (two) times daily.        Marland Kitchen levothyroxine (SYNTHROID, LEVOTHROID) 75 MCG tablet TAKE 1 TABLET EVERY DAY  60 tablet  6  . lisinopril (PRINIVIL,ZESTRIL) 10 MG tablet TAKE 1 TABLET EVERY DAY  60 tablet  5  . meloxicam (MOBIC) 7.5 MG tablet Take 7.5 mg by mouth 2 (two) times daily as needed.        . Multiple Vitamins-Minerals (WOMENS MULTIVITAMIN PLUS PO) Take 1 tablet by mouth daily.        Marland Kitchen PLAVIX 75 MG tablet TAKE 1 TABLET EVERY DAY  60 tablet  5   Current Facility-Administered Medications  Medication Dose Route Frequency Provider Last Rate Last Dose  . 0.9 %  sodium chloride infusion  500 mL Intravenous Continuous Mardella Layman, MD         No Known Allergies  BP 100/68  Pulse 76  Temp(Src) 97.6 F (36.4 C) (Temporal)  Resp 16  Ht 5' (1.524 m)  Wt 125 lb 4 oz (56.813 kg)   BMI 24.46 kg/m2     Review of Systems  Constitutional: Negative for fever, chills and diaphoresis.  HENT: Negative for ear pain, sore throat and trouble swallowing.   Eyes: Negative for photophobia and visual disturbance.  Respiratory: Negative for cough and choking.   Cardiovascular: Negative for chest pain and palpitations.  Gastrointestinal: Negative for nausea, vomiting, abdominal pain, diarrhea, constipation, anal bleeding and rectal pain.  Genitourinary: Negative for dysuria, frequency and difficulty urinating.  Musculoskeletal: Negative for myalgias and gait problem.  Skin: Negative for color change, pallor, rash and wound.  Neurological: Negative for dizziness, speech difficulty, weakness and numbness.  Hematological: Negative for adenopathy.  Psychiatric/Behavioral: Negative for confusion and agitation. The patient is not nervous/anxious.        Objective:   Physical Exam  Constitutional: She is oriented to person, place, and time. She appears well-developed and well-nourished. No distress.  HENT:  Head: Normocephalic.  Mouth/Throat: Oropharynx is clear and moist. No oropharyngeal exudate.  Eyes: Conjunctivae and EOM are normal. Pupils are equal, round, and reactive to light. No scleral icterus.  Neck: Normal range of motion. No tracheal deviation present.  Cardiovascular: Normal rate and intact distal pulses.   Pulmonary/Chest: Effort normal. No respiratory distress. She exhibits no tenderness.  Abdominal: Soft. She exhibits no distension. There is no tenderness. Hernia confirmed negative in the right inguinal area and confirmed negative in the left inguinal area.       Incisions clean with normal healing ridges.  No hernias  Genitourinary: No vaginal discharge found.  Musculoskeletal: Normal range of motion. She exhibits no tenderness.  Lymphadenopathy:       Right: No inguinal adenopathy present.       Left: No inguinal adenopathy present.  Neurological: She is alert  and oriented to person, place, and time. No cranial nerve deficit. She exhibits normal muscle tone. Coordination normal.  Skin: Skin is warm and dry. No rash noted. She is not diaphoretic.     Psychiatric: She has a normal mood and affect. Her behavior is normal.       Assessment:     Infected sebaceous cyst s/p I&D, healing well    Plan:     Increase activity as tolerated.  Do not push through pain.  Keep skin clean & dry.  I noted that sometimes material remained in some people need a reexcision. However this would  be more clean and less likely to be infected. However, is gone down markedly. Hopefully it will completely resolve. If it does, followup p.r.n.   If no,t I asked her to call us to see if we may need to excise any remaining tissue so it does not get reinfected  The patient expressed understanding and appreciation

## 2011-10-23 ENCOUNTER — Ambulatory Visit: Payer: Medicare Other | Admitting: Physical Therapy

## 2011-10-25 ENCOUNTER — Ambulatory Visit: Payer: Medicare Other | Attending: Diagnostic Neuroimaging | Admitting: Physical Therapy

## 2011-10-25 DIAGNOSIS — R269 Unspecified abnormalities of gait and mobility: Secondary | ICD-10-CM | POA: Insufficient documentation

## 2011-10-25 DIAGNOSIS — IMO0001 Reserved for inherently not codable concepts without codable children: Secondary | ICD-10-CM | POA: Insufficient documentation

## 2011-10-29 ENCOUNTER — Ambulatory Visit (INDEPENDENT_AMBULATORY_CARE_PROVIDER_SITE_OTHER): Payer: Medicare Other | Admitting: General Surgery

## 2011-10-30 ENCOUNTER — Ambulatory Visit: Payer: Medicare Other | Admitting: Physical Therapy

## 2011-11-01 ENCOUNTER — Ambulatory Visit: Payer: Medicare Other | Admitting: Physical Therapy

## 2011-11-08 ENCOUNTER — Ambulatory Visit: Payer: Medicare Other | Admitting: Pulmonary Disease

## 2011-11-12 ENCOUNTER — Other Ambulatory Visit (INDEPENDENT_AMBULATORY_CARE_PROVIDER_SITE_OTHER): Payer: Medicare Other

## 2011-11-12 ENCOUNTER — Ambulatory Visit (INDEPENDENT_AMBULATORY_CARE_PROVIDER_SITE_OTHER): Payer: Medicare Other | Admitting: Pulmonary Disease

## 2011-11-12 ENCOUNTER — Encounter: Payer: Self-pay | Admitting: Pulmonary Disease

## 2011-11-12 VITALS — BP 110/70 | HR 62 | Temp 97.4°F | Ht 60.0 in | Wt 124.2 lb

## 2011-11-12 DIAGNOSIS — E7800 Pure hypercholesterolemia, unspecified: Secondary | ICD-10-CM | POA: Diagnosis not present

## 2011-11-12 DIAGNOSIS — I10 Essential (primary) hypertension: Secondary | ICD-10-CM

## 2011-11-12 DIAGNOSIS — M545 Low back pain: Secondary | ICD-10-CM

## 2011-11-12 DIAGNOSIS — R7309 Other abnormal glucose: Secondary | ICD-10-CM

## 2011-11-12 DIAGNOSIS — E039 Hypothyroidism, unspecified: Secondary | ICD-10-CM | POA: Diagnosis not present

## 2011-11-12 DIAGNOSIS — M1990 Unspecified osteoarthritis, unspecified site: Secondary | ICD-10-CM

## 2011-11-12 DIAGNOSIS — R918 Other nonspecific abnormal finding of lung field: Secondary | ICD-10-CM | POA: Diagnosis not present

## 2011-11-12 DIAGNOSIS — I739 Peripheral vascular disease, unspecified: Secondary | ICD-10-CM

## 2011-11-12 DIAGNOSIS — G20 Parkinson's disease: Secondary | ICD-10-CM

## 2011-11-12 DIAGNOSIS — E559 Vitamin D deficiency, unspecified: Secondary | ICD-10-CM | POA: Diagnosis not present

## 2011-11-12 DIAGNOSIS — C50919 Malignant neoplasm of unspecified site of unspecified female breast: Secondary | ICD-10-CM

## 2011-11-12 DIAGNOSIS — I872 Venous insufficiency (chronic) (peripheral): Secondary | ICD-10-CM | POA: Diagnosis not present

## 2011-11-12 DIAGNOSIS — F411 Generalized anxiety disorder: Secondary | ICD-10-CM

## 2011-11-12 DIAGNOSIS — K5730 Diverticulosis of large intestine without perforation or abscess without bleeding: Secondary | ICD-10-CM

## 2011-11-12 LAB — BASIC METABOLIC PANEL
BUN: 19 mg/dL (ref 6–23)
Calcium: 9.4 mg/dL (ref 8.4–10.5)
Creatinine, Ser: 0.9 mg/dL (ref 0.4–1.2)
GFR: 84.3 mL/min (ref >60.00)
Glucose, Bld: 88 mg/dL (ref 70–99)

## 2011-11-12 LAB — CBC WITH DIFFERENTIAL/PLATELET
Basophils Relative: 0.8 % (ref 0.0–3.0)
Eosinophils Relative: 0.3 % (ref 0.0–5.0)
HCT: 40.9 % (ref 36.0–46.0)
Lymphs Abs: 1 10*3/uL (ref 0.7–4.0)
MCV: 93.6 fl (ref 78.0–100.0)
Monocytes Absolute: 0.2 10*3/uL (ref 0.1–1.0)
Monocytes Relative: 4.6 % (ref 3.0–12.0)
Platelets: 190 10*3/uL (ref 150.0–400.0)
RBC: 4.37 Mil/uL (ref 3.87–5.11)
WBC: 3.7 10*3/uL — ABNORMAL LOW (ref 4.5–10.5)

## 2011-11-12 LAB — LIPID PANEL
Cholesterol: 183 mg/dL (ref 0–200)
HDL: 72.2 mg/dL (ref >39.00)
LDL Cholesterol: 103 mg/dL — ABNORMAL HIGH (ref 0–99)
Triglycerides: 40 mg/dL (ref 0.0–149.0)
VLDL: 8 mg/dL (ref 0.0–40.0)

## 2011-11-12 LAB — HEPATIC FUNCTION PANEL
AST: 17 U/L (ref 0–37)
Albumin: 4.3 g/dL (ref 3.5–5.2)
Total Bilirubin: 1.1 mg/dL (ref 0.3–1.2)

## 2011-11-12 MED ORDER — LISINOPRIL 10 MG PO TABS: 10.0000 mg | ORAL_TABLET | Freq: Every day | ORAL | Status: DC

## 2011-11-12 MED ORDER — CLOPIDOGREL BISULFATE 75 MG PO TABS: 75.0000 mg | ORAL_TABLET | Freq: Every day | ORAL | Status: DC

## 2011-11-12 MED ORDER — LEVOTHYROXINE SODIUM 75 MCG PO TABS: 75.0000 ug | ORAL_TABLET | Freq: Every day | ORAL | Status: DC

## 2011-11-12 NOTE — Patient Instructions (Signed)
Today we updated your med list in our EPIC system...    Continue your current medications the same...    We refilled the meds you requested...  Today we did your follow up FASTING blood work...    We will call you in a few days w/ the reports...  Stay as active as possible...  Call for any questions...  Let's plan a follow up visit in 6 months.Marland KitchenMarland Kitchen

## 2011-11-12 NOTE — Progress Notes (Signed)
Subjective:    Patient ID: [name], female    DOB: 08/18/1940, 70 y.o.   MRN: [id number]  HPI 71 y/o BF here for a follow up visit... she has mult med problems as noted below...   ~  Nov 18, 2009:  she's had several eye probs treated by DrHecker (glaucoma, cats, dry eyes) & had laser Rx...  BP has been controlled & feeling well x for minor somatic complaints... wants FASTING blood work, and referral to Scientist, research (medical) as she doesn't want to make the trip to [facility] to see DrStacey for f/u Parkinson's dis...  ~  May 10, 2010:  she saw DrPenumalli for Neuro> Parkinson's dis & Rx w/ Stalevo 150- 5times daily (hx hallucinations on the 200 tabs), no changes made... 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   ~  Nov 08, 2010:  35mo ROV & doing well overall>  Worried about a "mole" on her back but exam shows mod sized sebaceous cyst & will need excision by DrWakefield when she is ready;  She has a f/u colonoscopy sched for 6/12 w/ DrPatterson & an Oncology f/u w/ Andi Hence next week;  She requests refills for 90d supplies, CXR today, & she will return for fasting labs in the AM> see prob list below:  ~  May 12, 2011:  35mo ROV & she has had a recent Neuro eval DrPenumali for hallucinations- seeing people who were not there, on Stalevo 5/d per Neuro & they cut back to 4/d;  She stopped her calcium & Vit D since they are both in her MVI but I explained the need for additional supplementation above & beyond the min requirements & she will restart Calcium & at least 2000u Vit D...  She remains stable on ASA/ Plavix w/o cerebral ischemic symptoms;  BP controlled, thyroid stable, etc...  See prob list below>>  Bone Densit Test 11/12 showed   ~  Nov 12, 2011:  35mo ROV & [name] has been stable; she had infected seb cyst I&D 4/13 by DrGross & now resolved; we  reviewed her prob list, meds, xrays and labs> see below>> CXR 5/13> she forgot to go to "[facility] for her yearly f/u film today... LABS 5/13:  FLP- at goals on diet alone;  Chems- wnl & A1c=5.8;  CBC- wnl;  TSH=2.34 on Levo75;  VitD=31 on 2000/d         Problem List:  GLAUCOMA (ICD-365.9) - on eye drops daily DrHecker and she reports cataract surg 2010, and Laser Rx 2011> followed for Glaucoma, s/p cats, dry eyes... ~  6/10:  eye exam by DrHecker neg for retinopathy or macular changes... ~  4/11: Argon laser trabeculoplasty planned by DrHecker.  ABNORMAL CHEST XRAY (ICD-793.1) - she has chr right hemidiaphragm elevation & no changes serially...  ~  CXR 5/09 w/ elev right hemidiaph, no change. ~  CXR 5/11 showed elev right hemidiaph & scarring right base, NAD. ~  CXR 5/12 showed elev right hemidiaph & scarring right base, NAD.Marland Kitchen. ~  CXR 5/13> she forgot to go to [facility] for her film...  HYPERTENSION (ICD-401.9) - on LISINOPRIL 10mg /d w/ good control...  ~  11/11: BP= 120/82> feeling well and denies HA, fatigue, visual changes, CP, palipit, dizziness, syncope, dyspnea, edema, etc... ~  5/12:  BP= 110/70> remains asymptomatic... ~  5/13:  BP= 110/70 & she is  feeling well, asymptomatic...  PERIPHERAL VASCULAR DISEASE (ICD-443.9) - on ASA 81mg /d & PLAVIX 75mg /d... she is asymptomatic w/o focal weakness, sensory changes, slurring, etc...  VENOUS INSUFFICIENCY (ICD-459.81) - on low sodium diet, etc... denies swelling...  HYPERCHOLESTEROLEMIA (ICD-272.0) - on diet rx alone... ~  FLP 5/08 showed TChol 217, TG 66, HDL 58, LDL 130... ~  FLP 5/09 showed TChol 194, TG 74, HDL 53, LDL 127... she prefers diet alone... ~  FLP 5/10 showed TChol 218, TG 60, HDL 62, LDL 142... still prefers diet + FishOil rec. ~  FLP 5/11 showed TChol 193, TG 49, HDL 63, LDL 120... improved, continue diet, get wt down. ~  FLP 5/12 showed TChol 162, TG 54, HDL 60, LDL 91 ~  FLP 5/13 on diet alone showed TChol 183, TG 40, HDL 72,  LDL 103  GLUCOSE INTOLERANCE, BORDERLINE (ICD-790.29) - on diet rx alone & doing very well... ~  labs 5/08 showed BS=99, HGA1c=5.9 ~  labs 5/09 showed BS= 90, HgA1c= 5.9 ~  labs 5/10 showed BS= 92, A1c= 5.7 ~  labs 5/11 (wt= 126#) showed BS= 92 ~  Labs 5/12 (wt=130#) showed BS= 75 ~  Labs 5/13 (wt=124#) showed BS= 88, A1c= 5.8  HYPOTHYROIDISM (ICD-244.9) - on SYNTHROID 15mcg/d... Hx HYPERTHYROIDISM w/ I-131 rx 9/05...  ~  labs 5/08 on Synthroid 26mcg/d showed TSH= 0.73... keep same. ~  labs 5/09 on Synth88 showed TSH= 0.72 ~  labs 5/10 on Synth88 showed TSH= 0.49 ~  labs 5/11 on Synth88 showed TSH= 0.28... rec> decr dose 78mcg/d (she is concerned about "facial hair") ~  labs 11/11 on Synthroid 28mcg/d showed TSH= 3.92... keep same. ~  Labs 5/12 on Synth75 showed TSH= 1.89 ~  Labs 5/13 on Synth75 showed TSH= 2.34  DIVERTICULOSIS OF COLON (ICD-562.10) - there is a +fam hx of colon cancer in her father... ~  last colonoscopy 8/06 by DrPatterson showed divertics, hems... f/u 59yrs. ~  F/u colonoscopy 6/12 by DrPatterson showed divertics, otherw neg...  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 ~  She continues periodic f/u evals by DrWakefield, DrMoody, & DrKhan==> on observation alone now...  DEGENERATIVE JOINT DISEASE (ICD-715.90) & HIP PAIN (ICD-719.45) - she takes calcium & vitamins (including "Nature's Code which has 1,200 u Vit D)... prev on Celebrex & Tramadol but uses OTC meds now as needed... she tells me she had a BMD at DrBertrand's in 2008 per her GYN. ~  12/10: presents w/ right hip pain> XRays neg; Rx w/ Dosepak, Mobic, rest, heat... ~  11/11:  c/o left hip pain & XRay showed mild degen changes> Rx MOBIC 7.5mg  Prn...  BACK  PAIN, LUMBAR (ICD-724.2) - she is s/p lumbar laminectomy 6/07 by DrGioffre for spinal stenosis... ~  Lower spine degen changes seen on f/u XRays...  OSTEOPENIA (ICD-733.90) - she had a normal BMD in 2000- ?when her last BMD was done?... Vit D level was 24 May09 and started on 50,000 u weekly... f/u Vit D level 11/09 = 47 & switched to OTC Vit D supplement. ~  labs 5/11 showed Vit D level = 29... rec incr VIt D to 2000 u daily. ~  She is encouraged to get a follow up BMD when she has her  next mammogram & will let us know...  PARKINSON'S DISEASE (ICD-332.0) - she has mod severe Parkinson's dis followed by DrPenumalli & DrStacey at [facility] on STELEVO 150mg - 5 times daily... she has been doing well & very stable on this medication... ~  5/11: she requests referral to [facility] for f/u Parkinson's> seen by DrPenumalli & no changes made. ~  11/12:  She reports recent eval by DrPenumali for hallucinations> MRI showed mild atrophy & sm vessel ischemic dis & they decr her Stavelo from 5 to 4/d...  ANXIETY (ICD-300.00) - prev on Prn Valium but off this medication now...  SEBACEOUS CYST>  She mentioned a "mole" on her back during 5/12 OV & exam revealed a mod sized seb cyst; rec to ask DrWakfield to excise this at his convenience==> removed 9/12.   Past Surgical History  Procedure Date  . Ovarian cyst removal   . Lapraoscopic cholecystectomy 09/2003    Dr. Derrell Lolling  . Lumbar laminectomy and foraminotomies for spinal stenosis 11/2005    Dr. Darrelyn Hillock  . Cataract surgery and argon laser trabeculoplasty 04/2010    Dr. Elmer Picker  . Colonoscopy   . Polypectomy   . Cholecystectomy   . Breast lumpectomy 8/11 by DrWakefield  . Incise and drain abcess 10/04/11    sebaceous cyst on back    Outpatient Encounter Prescriptions as of 11/12/2011  Medication Sig Dispense Refill  . aspirin 81 MG tablet Take 81 mg by mouth daily.        . Calcium Carbonate-Vitamin D (CALTRATE 600+D) 600-400 MG-UNIT per tablet  Take 1 tablet by mouth 2 (two) times daily.        . carbidopa-levodopa-entacapone (STALEVO 150) 37.5-150-200 MG per tablet Take 1 tablet by mouth 5 (five) times daily as needed. As directed by Dr. Misty Stanley       . Cholecalciferol (VITAMIN D) 2000 UNITS CAPS Take 1 capsule by mouth daily.        . cycloSPORINE (RESTASIS) 0.05 % ophthalmic emulsion Place 1 drop into both eyes 2 (two) times daily.        Marland Kitchen levothyroxine (SYNTHROID, LEVOTHROID) 75 MCG tablet TAKE 1 TABLET EVERY DAY  60 tablet  6  . lisinopril (PRINIVIL,ZESTRIL) 10 MG tablet TAKE 1 TABLET EVERY DAY  60 tablet  5  . meloxicam (MOBIC) 7.5 MG tablet Take 7.5 mg by mouth 2 (two) times daily as needed.        . Multiple Vitamins-Minerals (WOMENS MULTIVITAMIN PLUS PO) Take 1 tablet by mouth daily.        Marland Kitchen PLAVIX 75 MG tablet TAKE 1 TABLET EVERY DAY  60 tablet  5   Facility-Administered Encounter Medications as of 11/12/2011  Medication Dose Route Frequency Provider Last Rate Last Dose  . 0.9 %  sodium chloride infusion  500 mL Intravenous Continuous Mardella Layman, MD        No Known Allergies   Current Medications, Allergies, Past Medical History, Past Surgical History, Family History, and Social History were reviewed in [employer] record.   Review of Systems        See HPI - all other systems neg except as noted... The patient complains of dyspnea on exertion, muscle weakness, and difficulty walking.  The patient denies anorexia, fever, weight loss, weight gain, vision loss, decreased hearing, hoarseness, chest pain, syncope, peripheral edema, prolonged cough, headaches, hemoptysis, abdominal pain, melena, hematochezia, severe indigestion/heartburn, hematuria, incontinence, suspicious skin lesions, transient blindness, depression, unusual weight change, abnormal bleeding, enlarged lymph  nodes, and angioedema.     Objective:   Physical Exam    WD, WN, 71 y/o BF in NAD.Marland KitchenMarland Kitchen slow moving but only min tremor  from Parkinsons... GENERAL:  Alert & oriented; pleasant & cooperative... HEENT:  [place]/AT, EOM-wnl, PERRLA, EACs-clear, TMs-wnl, NOSE-clear, THROAT-clear & wnl. NECK:  Supple w/ fairROM; no JVD; normal carotid impulses w/o bruits; no thyromegaly or nodules palpated; no lymphadenopathy. CHEST:  Clear to P & A; without wheezes/ rales/ or rhonchi. HEART:  Regular Rhythm; without murmurs/ rubs/ or gallops. ABDOMEN:  Soft & nontender; normal bowel sounds; no organomegaly or masses detected. EXT: without deformities, mod arthritic changes; no varicose veins/ +venous insuffic/ no edema. fairly good ROM in hips etc... NEURO:  CN's intact; min tremor and bradykinesia of Parkinson's; no focal neuro deficits... DERM:  No lesions noted; no rash etc...   Assessment & Plan:   HBP>  Stable on Lisinopril, tolerating well, continue same...  ASPVD>  Stable on ASA/ Plavix & denies cerebral ischemic symptoms, she does not want to change meds...  CHOL>  Her lipids are good on diet alone; continue diet efforts...  DM>  Borderline at worst, her BS & A1c has been normal x yrs...  Hypothyroidism>  Clinically stable on 57mcg/d...  GI>  Follow up colon was neg & she is stable...  BREAST CANCER>  She had left breast DCIS excised 8/11 w/ post op XRT as noted; refused Tamoxifen, followed by Andi Hence...  DJD>  She tells me she still managed quite satis on OTC analgesics...  Osteopenia>  Vit D level is 31 & she is rec totake 2000u extra every day; we will sched BMD here...  Parkinsons>  She is stable w/ mod severe dis on the Stalevo Rx; but this has beed weaned from 5/d to 4/d due to hallucinations by DrPenumali...   Patient's Medications  New Prescriptions   No medications on file  Previous Medications   ASPIRIN 81 MG TABLET    Take 81 mg by mouth daily.     CALCIUM CARBONATE-VITAMIN D (CALTRATE 600+D) 600-400 MG-UNIT PER TABLET    Take 1 tablet by mouth 2 (two) times daily.     CARBIDOPA-LEVODOPA-ENTACAPONE  (STALEVO 150) 37.5-150-200 MG PER TABLET    Take 1 tablet by mouth 5 (five) times daily as needed. As directed by Dr. Misty Stanley    CHOLECALCIFEROL (VITAMIN D) 2000 UNITS CAPS    Take 1 capsule by mouth daily.     CYCLOSPORINE (RESTASIS) 0.05 % OPHTHALMIC EMULSION    Place 1 drop into both eyes 2 (two) times daily.     MELOXICAM (MOBIC) 7.5 MG TABLET    Take 7.5 mg by mouth 2 (two) times daily as needed.     MULTIPLE VITAMINS-MINERALS (WOMENS MULTIVITAMIN PLUS PO)    Take 1 tablet by mouth daily.    Modified Medications   Modified Medication Previous Medication   CLOPIDOGREL (PLAVIX) 75 MG TABLET PLAVIX 75 MG tablet      Take 1 tablet (75 mg total) by mouth daily.    TAKE 1 TABLET EVERY DAY   LEVOTHYROXINE (SYNTHROID, LEVOTHROID) 75 MCG TABLET levothyroxine (SYNTHROID, LEVOTHROID) 75 MCG tablet      Take 1 tablet (75 mcg total) by mouth daily.    TAKE 1 TABLET EVERY DAY   LISINOPRIL (PRINIVIL,ZESTRIL) 10 MG TABLET lisinopril (PRINIVIL,ZESTRIL) 10 MG tablet      Take 1 tablet (10 mg total) by mouth daily.    TAKE 1 TABLET EVERY DAY  Discontinued Medications  No medications on file

## 2011-11-13 LAB — VITAMIN D 25 HYDROXY (VIT D DEFICIENCY, FRACTURES): Vit D, 25-Hydroxy: 31 ng/mL (ref 30–89)

## 2011-11-13 LAB — TSH: TSH: 2.34 u[IU]/mL (ref 0.35–5.50)

## 2011-11-28 ENCOUNTER — Other Ambulatory Visit: Payer: Self-pay | Admitting: Pulmonary Disease

## 2011-12-06 ENCOUNTER — Other Ambulatory Visit (HOSPITAL_BASED_OUTPATIENT_CLINIC_OR_DEPARTMENT_OTHER): Payer: Medicare Other

## 2011-12-06 ENCOUNTER — Encounter: Payer: Self-pay | Admitting: Oncology

## 2011-12-06 ENCOUNTER — Telehealth: Payer: Self-pay | Admitting: Oncology

## 2011-12-06 ENCOUNTER — Ambulatory Visit (HOSPITAL_BASED_OUTPATIENT_CLINIC_OR_DEPARTMENT_OTHER): Payer: Medicare Other | Admitting: Oncology

## 2011-12-06 VITALS — BP 105/66 | HR 72 | Temp 98.1°F | Ht 60.0 in | Wt 123.5 lb

## 2011-12-06 DIAGNOSIS — C50919 Malignant neoplasm of unspecified site of unspecified female breast: Secondary | ICD-10-CM

## 2011-12-06 DIAGNOSIS — D0590 Unspecified type of carcinoma in situ of unspecified breast: Secondary | ICD-10-CM

## 2011-12-06 DIAGNOSIS — Z800 Family history of malignant neoplasm of digestive organs: Secondary | ICD-10-CM

## 2011-12-06 DIAGNOSIS — E119 Type 2 diabetes mellitus without complications: Secondary | ICD-10-CM | POA: Diagnosis not present

## 2011-12-06 DIAGNOSIS — I10 Essential (primary) hypertension: Secondary | ICD-10-CM

## 2011-12-06 LAB — COMPREHENSIVE METABOLIC PANEL
Albumin: 4 g/dL (ref 3.5–5.2)
BUN: 16 mg/dL (ref 6–23)
CO2: 23 mEq/L (ref 19–32)
Calcium: 8.9 mg/dL (ref 8.4–10.5)
Glucose, Bld: 94 mg/dL (ref 70–99)
Potassium: 3.9 mEq/L (ref 3.5–5.3)
Sodium: 139 mEq/L (ref 135–145)
Total Protein: 6.2 g/dL (ref 6.0–8.3)

## 2011-12-06 LAB — CBC WITH DIFFERENTIAL/PLATELET
Basophils Absolute: 0 10*3/uL (ref 0.0–0.1)
Eosinophils Absolute: 0 10*3/uL (ref 0.0–0.5)
HGB: 13 g/dL (ref 11.6–15.9)
MONO#: 0.2 10*3/uL (ref 0.1–0.9)
NEUT#: 2.2 10*3/uL (ref 1.5–6.5)
Platelets: 183 10*3/uL (ref 145–400)
RBC: 4.14 10*6/uL (ref 3.70–5.45)
RDW: 13.3 % (ref 11.2–14.5)
WBC: 3.3 10*3/uL — ABNORMAL LOW (ref 3.9–10.3)

## 2011-12-06 NOTE — Telephone Encounter (Signed)
gve the pt her June 204 appt calendar along with the mammo appt at [facility]

## 2011-12-06 NOTE — Patient Instructions (Addendum)
1. You are doing well, no clinically evidence recurrent cancer  2. You need a mammogram this and I have set you up at [facility] for it in August of this year.  3. I will continue to see you on a yearly basis

## 2011-12-06 NOTE — Progress Notes (Signed)
OFFICE PROGRESS NOTE  CC  NADEL,SCOTT M, MD [facility] [address] [place] [address] [place] [address] Dr. Emelia Loron Dr. Dorothy Puffer  DIAGNOSIS: 71 year old female with DCIS of the left breast status post lumpectomy on 02/08/2010 her  PRIOR THERAPY:  #1 patient underwent a left breast lumpectomy in August 2007 with the final pathology revealing a 0.25 cm high-grade DCIS without necrosis. Tumor was ER positive PR positive.  #2 she then went on to receive radiation therapy to the left breast from 03/27/2006 to 04/22/1999 leptin.  #3 patient was offered tamoxifen as chemoprevention but she declined. And she has been on observation only  CURRENT THERAPY: observation  INTERVAL HISTORY: [name] 71 y.o. female returns for followup visit today. She was last seen about a year ago. Clinically she seems to be doing well and is without any significant complaints. She does comes to see me on an annual basis. She also apparently continues to see Dr. Dwain Sarna. Today she is without any significant complaints related to her breast cancer. She has no lumps bumps no nausea vomiting no myalgias or arthralgias no nipple discharge. Her surgical scar is healed. Remainder of the 10 point review of systems is negative.  MEDICAL HISTORY: Past Medical History  Diagnosis Date  . Glaucoma   . Abnormal chest x-ray   . Hypertension   . Peripheral vascular disease   . Venous insufficiency   . Hypercholesteremia   . Hypothyroid   . Diverticulosis of colon   . DJD (degenerative joint disease)   . Hip pain   . Lumbar back pain   . Osteopenia   . Vitamin d deficiency   . Parkinson disease   . Anxiety   . Breast cancer     stage 0 left    ALLERGIES:   has no known allergies.  MEDICATIONS:  Current Outpatient Prescriptions  Medication Sig Dispense Refill  . aspirin 81 MG tablet Take 81 mg by mouth daily.        . Calcium Carbonate-Vitamin D (CALTRATE 600+D) 600-400 MG-UNIT per  tablet Take 1 tablet by mouth 2 (two) times daily.        . carbidopa-levodopa-entacapone (STALEVO 150) 37.5-150-200 MG per tablet Take 1 tablet by mouth 5 (five) times daily as needed. As directed by Dr. Misty Stanley       . Cholecalciferol (VITAMIN D) 2000 UNITS CAPS Take 1 capsule by mouth daily.        . clopidogrel (PLAVIX) 75 MG tablet Take 1 tablet (75 mg total) by mouth daily.  60 tablet  5  . clopidogrel (PLAVIX) 75 MG tablet TAKE 1 TABLET EVERY DAY  60 tablet  5  . cycloSPORINE (RESTASIS) 0.05 % ophthalmic emulsion Place 1 drop into both eyes 2 (two) times daily.        Marland Kitchen levothyroxine (SYNTHROID, LEVOTHROID) 75 MCG tablet Take 1 tablet (75 mcg total) by mouth daily.  60 tablet  6  . lisinopril (PRINIVIL,ZESTRIL) 10 MG tablet Take 1 tablet (10 mg total) by mouth daily.  60 tablet  5  . lisinopril (PRINIVIL,ZESTRIL) 10 MG tablet TAKE 1 TABLET EVERY DAY  60 tablet  5  . meloxicam (MOBIC) 7.5 MG tablet Take 7.5 mg by mouth 2 (two) times daily as needed.        . Multiple Vitamins-Minerals (WOMENS MULTIVITAMIN PLUS PO) Take 1 tablet by mouth daily.         Current Facility-Administered Medications  Medication Dose Route Frequency Provider Last  Rate Last Dose  . 0.9 %  sodium chloride infusion  500 mL Intravenous Continuous Mardella Layman, MD        SURGICAL HISTORY:  Past Surgical History  Procedure Date  . Ovarian cyst removal   . Lapraoscopic cholecystectomy 09/2003    Dr. Derrell Lolling  . Lumbar laminectomy and foraminotomies for spinal stenosis 11/2005    Dr. Darrelyn Hillock  . Cataract surgery and argon laser trabeculoplasty 04/2010    Dr. Elmer Picker  . Colonoscopy   . Polypectomy   . Cholecystectomy   . Breast lumpectomy 8/11 by DrWakefield  . Incise and drain abcess 10/04/11    sebaceous cyst on back    REVIEW OF SYSTEMS:  Pertinent items are noted in HPI.   PHYSICAL EXAMINATION: General appearance: alert, cooperative and appears stated age Neck: no adenopathy, no carotid bruit, no JVD,  supple, symmetrical, trachea midline and thyroid not enlarged, symmetric, no tenderness/mass/nodules Lymph nodes: Cervical, supraclavicular, and axillary nodes normal. Resp: clear to auscultation bilaterally and normal percussion bilaterally Back: symmetric, no curvature. ROM normal. No CVA tenderness. Cardio: regular rate and rhythm, S1, S2 normal, no murmur, click, rub or gallop GI: soft, non-tender; bowel sounds normal; no masses,  no organomegaly Extremities: extremities normal, atraumatic, no cyanosis or edema Neurologic: Grossly normal Breast examination: Right breast reveals no evidence of skin changes no nipple discharge no dominant masses. Left breast reveals a well-healed incisional scar no masses no nipple discharge or other skin changes. ECOG PERFORMANCE STATUS: 0 - Asymptomatic  Blood pressure 105/66, pulse 72, temperature 98.1 F (36.7 C), temperature source Oral, height 5' (1.524 m), weight 123 lb 8 oz (56.019 kg).  LABORATORY DATA: Lab Results  Component Value Date   WBC 3.3* 12/06/2011   HGB 13.0 12/06/2011   HCT 39.4 12/06/2011   MCV 95.0 12/06/2011   PLT 183 12/06/2011      Chemistry      Component Value Date/Time   NA 141 11/12/2011 1213   K 4.6 11/12/2011 1213   CL 104 11/12/2011 1213   CO2 29 11/12/2011 1213   BUN 19 11/12/2011 1213   CREATININE 0.9 11/12/2011 1213      Component Value Date/Time   CALCIUM 9.4 11/12/2011 1213   ALKPHOS 55 11/12/2011 1213   AST 17 11/12/2011 1213   ALT 4 11/12/2011 1213   BILITOT 1.1 11/12/2011 1213       RADIOGRAPHIC STUDIES:  No results found.  ASSESSMENT: 71 year old female with high-grade DCIS measuring 0.25 cm diagnosed in August 2007 when she underwent a lumpectomy. She did receive post lumpectomy radiation therapy to the left breast completed on 04/22/2007 11. Overall she had tolerated the radiation well. Patient was offered tamoxifen as a chemopreventive but she declined. She has been on observation only she seems to be  doing well and she is without any evidence of recurrent disease.   PLAN: we will continue to see the patient on a yearly basis. Certainly she needs to continue getting her mammograms performed to self breast examinations and continue to followup with Dr. Kriste Basque regarding her other medical care.   All questions were answered. The patient knows to call the clinic with any problems, questions or concerns. We can certainly see the patient much sooner if necessary.  I spent 20 minutes counseling the patient face to face. The total time spent in the appointment was 30 minutes.    Drue Second, MD Medical/Oncology [facility] [phone] (beeper) [phone] (Office)  12/06/2011, 2:33 PM

## 2012-01-15 DIAGNOSIS — Z0189 Encounter for other specified special examinations: Secondary | ICD-10-CM | POA: Diagnosis not present

## 2012-01-15 DIAGNOSIS — R928 Other abnormal and inconclusive findings on diagnostic imaging of breast: Secondary | ICD-10-CM | POA: Diagnosis not present

## 2012-01-15 DIAGNOSIS — Z853 Personal history of malignant neoplasm of breast: Secondary | ICD-10-CM | POA: Diagnosis not present

## 2012-01-22 ENCOUNTER — Other Ambulatory Visit: Payer: Self-pay

## 2012-01-22 DIAGNOSIS — R928 Other abnormal and inconclusive findings on diagnostic imaging of breast: Secondary | ICD-10-CM | POA: Diagnosis not present

## 2012-01-22 DIAGNOSIS — Z853 Personal history of malignant neoplasm of breast: Secondary | ICD-10-CM | POA: Diagnosis not present

## 2012-01-22 DIAGNOSIS — Z0189 Encounter for other specified special examinations: Secondary | ICD-10-CM | POA: Diagnosis not present

## 2012-01-22 DIAGNOSIS — N6019 Diffuse cystic mastopathy of unspecified breast: Secondary | ICD-10-CM | POA: Diagnosis not present

## 2012-02-05 ENCOUNTER — Other Ambulatory Visit: Payer: Self-pay | Admitting: Pulmonary Disease

## 2012-02-07 DIAGNOSIS — IMO0002 Reserved for concepts with insufficient information to code with codable children: Secondary | ICD-10-CM | POA: Diagnosis not present

## 2012-03-13 DIAGNOSIS — H43399 Other vitreous opacities, unspecified eye: Secondary | ICD-10-CM | POA: Diagnosis not present

## 2012-03-13 DIAGNOSIS — H26499 Other secondary cataract, unspecified eye: Secondary | ICD-10-CM | POA: Diagnosis not present

## 2012-03-13 DIAGNOSIS — H4011X0 Primary open-angle glaucoma, stage unspecified: Secondary | ICD-10-CM | POA: Diagnosis not present

## 2012-03-13 DIAGNOSIS — H04129 Dry eye syndrome of unspecified lacrimal gland: Secondary | ICD-10-CM | POA: Diagnosis not present

## 2012-03-13 DIAGNOSIS — H35379 Puckering of macula, unspecified eye: Secondary | ICD-10-CM | POA: Diagnosis not present

## 2012-03-13 DIAGNOSIS — H409 Unspecified glaucoma: Secondary | ICD-10-CM | POA: Diagnosis not present

## 2012-03-17 ENCOUNTER — Other Ambulatory Visit: Payer: Self-pay

## 2012-03-17 DIAGNOSIS — R928 Other abnormal and inconclusive findings on diagnostic imaging of breast: Secondary | ICD-10-CM | POA: Diagnosis not present

## 2012-03-17 DIAGNOSIS — N6489 Other specified disorders of breast: Secondary | ICD-10-CM | POA: Diagnosis not present

## 2012-03-17 DIAGNOSIS — Z0189 Encounter for other specified special examinations: Secondary | ICD-10-CM | POA: Diagnosis not present

## 2012-03-17 DIAGNOSIS — D249 Benign neoplasm of unspecified breast: Secondary | ICD-10-CM | POA: Diagnosis not present

## 2012-04-02 ENCOUNTER — Encounter: Payer: Self-pay | Admitting: Pulmonary Disease

## 2012-04-16 DIAGNOSIS — G20 Parkinson's disease: Secondary | ICD-10-CM | POA: Diagnosis not present

## 2012-05-02 ENCOUNTER — Ambulatory Visit (INDEPENDENT_AMBULATORY_CARE_PROVIDER_SITE_OTHER): Payer: Medicare Other

## 2012-05-02 DIAGNOSIS — Z23 Encounter for immunization: Secondary | ICD-10-CM

## 2012-05-06 ENCOUNTER — Ambulatory Visit (INDEPENDENT_AMBULATORY_CARE_PROVIDER_SITE_OTHER): Payer: Medicare Other | Admitting: Adult Health

## 2012-05-06 ENCOUNTER — Encounter: Payer: Self-pay | Admitting: Adult Health

## 2012-05-06 ENCOUNTER — Ambulatory Visit (INDEPENDENT_AMBULATORY_CARE_PROVIDER_SITE_OTHER)
Admission: RE | Admit: 2012-05-06 | Discharge: 2012-05-06 | Disposition: A | Discharge status: Home / Self Care | Payer: Medicare Other | Source: Ambulatory Visit | Attending: Adult Health | Admitting: Adult Health

## 2012-05-06 VITALS — BP 114/66 | HR 72 | Temp 98.1°F | Ht 60.0 in | Wt 122.6 lb

## 2012-05-06 DIAGNOSIS — M545 Low back pain: Secondary | ICD-10-CM | POA: Diagnosis not present

## 2012-05-06 DIAGNOSIS — M1990 Unspecified osteoarthritis, unspecified site: Secondary | ICD-10-CM

## 2012-05-06 DIAGNOSIS — M5137 Other intervertebral disc degeneration, lumbosacral region: Secondary | ICD-10-CM | POA: Diagnosis not present

## 2012-05-06 NOTE — Progress Notes (Signed)
Subjective:    Patient ID: [name], female    DOB: 06/10/1941, 71 y.o.   MRN: [id number]  HPI 70 y/o BF here for a follow up visit... she has mult med problems as noted below...   ~  Nov 18, 2009:  she's had several eye probs treated by DrHecker (glaucoma, cats, dry eyes) & had laser Rx...  BP has been controlled & feeling well x for minor somatic complaints... wants FASTING blood work, and referral to Scientist, research (medical) as she doesn't want to make the trip to [facility] to see DrStacey for f/u Parkinson's dis...  ~  May 10, 2010:  she saw DrPenumalli for Neuro> Parkinson's dis & Rx w/ Stalevo 150- 5times daily (hx hallucinations on the 200 tabs), no changes made... 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   ~  Nov 08, 2010:  90mo ROV & doing well overall>  Worried about a "mole" on her back but exam shows mod sized sebaceous cyst & will need excision by DrWakefield when she is ready;  She has a f/u colonoscopy sched for 6/12 w/ DrPatterson & an Oncology f/u w/ Andi Hence next week;  She requests refills for 90d supplies, CXR today, & she will return for fasting labs in the AM> see prob list below:  ~  May 12, 2011:  90mo ROV & she has had a recent Neuro eval DrPenumali for hallucinations- seeing people who were not there, on Stalevo 5/d per Neuro & they cut back to 4/d;  She stopped her calcium & Vit D since they are both in her MVI but I explained the need for additional supplementation above & beyond the min requirements & she will restart Calcium & at least 2000u Vit D...  She remains stable on ASA/ Plavix w/o cerebral ischemic symptoms;  BP controlled, thyroid stable, etc...  See prob list below>>  Bone Densit Test 11/12 showed   ~  Nov 12, 2011:  90mo ROV & [name] has been stable; she had infected seb cyst I&D 4/13 by DrGross & now resolved; we  reviewed her prob list, meds, xrays and labs> see below>> CXR 5/13> she forgot to go to "XRay for her yearly f/u film today... LABS 5/13:  FLP- at goals on diet alone;  Chems- wnl & A1c=5.8;  CBC- wnl;  TSH=2.34 on Levo75;  VitD=31 on 2000/d  05/06/2012 Acute OV  Complains of low back pain, radiating down the buttocks x1 month, worse yesterday after doing exercises . Pain worse w/ movement, tender to touch.  Denies urinary symptoms, dysuria, urgency, n/v/d, rash, chest pain or syncope.  No otc used. Not taking mobic made her stomach sick.          Problem List:  GLAUCOMA (ICD-365.9) - on eye drops daily DrHecker and she reports cataract surg 2010, and Laser Rx 2011> followed for Glaucoma, s/p cats, dry eyes... ~  6/10:  eye exam by DrHecker neg for retinopathy or macular changes... ~  4/11: Argon laser trabeculoplasty planned by DrHecker.  ABNORMAL CHEST XRAY (ICD-793.1) - she has chr right hemidiaphragm elevation & no changes serially...  ~  CXR 5/09 w/ elev right hemidiaph, no change. ~  CXR 5/11 showed elev right hemidiaph & scarring right base, NAD. ~  CXR 5/12 showed elev right hemidiaph & scarring right base, NAD.Marland Kitchen. ~  CXR 5/13> she forgot to go  to [facility] for her film...  HYPERTENSION (ICD-401.9) - on LISINOPRIL 10mg /d w/ good control...  ~  11/11: BP= 120/82> feeling well and denies HA, fatigue, visual changes, CP, palipit, dizziness, syncope, dyspnea, edema, etc... ~  5/12:  BP= 110/70> remains asymptomatic... ~  5/13:  BP= 110/70 & she is feeling well, asymptomatic...  PERIPHERAL VASCULAR DISEASE (ICD-443.9) - on ASA 81mg /d & PLAVIX 75mg /d... she is asymptomatic w/o focal weakness, sensory changes, slurring, etc...  VENOUS INSUFFICIENCY (ICD-459.81) - on low sodium diet, etc... denies swelling...  HYPERCHOLESTEROLEMIA (ICD-272.0) - on diet rx alone... ~  FLP 5/08 showed TChol 217, TG 66, HDL 58, LDL 130... ~  FLP 5/09 showed TChol 194, TG 74, HDL 53, LDL 127... she prefers  diet alone... ~  FLP 5/10 showed TChol 218, TG 60, HDL 62, LDL 142... still prefers diet + FishOil rec. ~  FLP 5/11 showed TChol 193, TG 49, HDL 63, LDL 120... improved, continue diet, get wt down. ~  FLP 5/12 showed TChol 162, TG 54, HDL 60, LDL 91 ~  FLP 5/13 on diet alone showed TChol 183, TG 40, HDL 72, LDL 103  GLUCOSE INTOLERANCE, BORDERLINE (ICD-790.29) - on diet rx alone & doing very well... ~  labs 5/08 showed BS=99, HGA1c=5.9 ~  labs 5/09 showed BS= 90, HgA1c= 5.9 ~  labs 5/10 showed BS= 92, A1c= 5.7 ~  labs 5/11 (wt= 126#) showed BS= 92 ~  Labs 5/12 (wt=130#) showed BS= 75 ~  Labs 5/13 (wt=124#) showed BS= 88, A1c= 5.8  HYPOTHYROIDISM (ICD-244.9) - on SYNTHROID 73mcg/d... Hx HYPERTHYROIDISM w/ I-131 rx 9/05...  ~  labs 5/08 on Synthroid 47mcg/d showed TSH= 0.73... keep same. ~  labs 5/09 on Synth88 showed TSH= 0.72 ~  labs 5/10 on Synth88 showed TSH= 0.49 ~  labs 5/11 on Synth88 showed TSH= 0.28... rec> decr dose 81mcg/d (she is concerned about "facial hair") ~  labs 11/11 on Synthroid 39mcg/d showed TSH= 3.92... keep same. ~  Labs 5/12 on Synth75 showed TSH= 1.89 ~  Labs 5/13 on Synth75 showed TSH= 2.34  DIVERTICULOSIS OF COLON (ICD-562.10) - there is a +fam hx of colon cancer in her father... ~  last colonoscopy 8/06 by DrPatterson showed divertics, hems... f/u 65yrs. ~  F/u colonoscopy 6/12 by DrPatterson showed divertics, otherw neg...  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 ~  She continues periodic f/u evals by DrWakefield, DrMoody, & DrKhan==> on observation alone now...  DEGENERATIVE JOINT DISEASE (ICD-715.90) & HIP PAIN (ICD-719.45) - she takes calcium & vitamins (including "Nature's Code which has 1,200 u Vit  D)... prev on Celebrex & Tramadol but uses OTC meds now as needed... she tells me she had a BMD at DrBertrand's in 2008 per her GYN. ~  12/10: presents w/ right hip pain> XRays neg; Rx w/ Dosepak, Mobic, rest, heat... ~  11/11:  c/o left hip pain & XRay showed mild degen changes> Rx MOBIC 7.5mg  Prn...  BACK PAIN, LUMBAR (ICD-724.2) - she is s/p lumbar laminectomy 6/07 by DrGioffre for spinal stenosis... ~  Lower spine degen changes seen on f/u XRays...  OSTEOPENIA (ICD-733.90) - she had a normal BMD in 2000- ?when her last BMD was done?... Vit D level was 24 May09  and started on 50,000 u weekly... f/u Vit D level 11/09 = 47 & switched to OTC Vit D supplement. ~  labs 5/11 showed Vit D level = 29... rec incr VIt D to 2000 u daily. ~  She is encouraged to get a follow up BMD when she has her next mammogram & will let us know...  PARKINSON'S DISEASE (ICD-332.0) - she has mod severe Parkinson's dis followed by DrPenumalli & DrStacey at [facility] on STELEVO 150mg - 5 times daily... she has been doing well & very stable on this medication... ~  5/11: she requests referral to [facility] for f/u Parkinson's> seen by DrPenumalli & no changes made. ~  11/12:  She reports recent eval by DrPenumali for hallucinations> MRI showed mild atrophy & sm vessel ischemic dis & they decr her Stavelo from 5 to 4/d...  ANXIETY (ICD-300.00) - prev on Prn Valium but off this medication now...  SEBACEOUS CYST>  She mentioned a "mole" on her back during 5/12 OV & exam revealed a mod sized seb cyst; rec to ask DrWakfield to excise this at his convenience==> removed 9/12.   Past Surgical History  Procedure Date  . Ovarian cyst removal   . Lapraoscopic cholecystectomy 09/2003    Dr. Derrell Lolling  . Lumbar laminectomy and foraminotomies for spinal stenosis 11/2005    Dr. Darrelyn Hillock  . Cataract surgery and argon laser trabeculoplasty 04/2010    Dr. Elmer Picker  . Colonoscopy   . Polypectomy   . Cholecystectomy   . Breast  lumpectomy 8/11 by DrWakefield  . Incise and drain abcess 10/04/11    sebaceous cyst on back    Outpatient Encounter Prescriptions as of 05/06/2012  Medication Sig Dispense Refill  . aspirin 81 MG tablet Take 81 mg by mouth daily.        . Calcium Carbonate-Vitamin D (CALTRATE 600+D) 600-400 MG-UNIT per tablet Take 1 tablet by mouth 2 (two) times daily.        . carbidopa-levodopa-entacapone (STALEVO 150) 37.5-150-200 MG per tablet Take 1 tablet by mouth 5 (five) times daily as needed. As directed by Dr. Misty Stanley       . Cholecalciferol (VITAMIN D) 2000 UNITS CAPS Take 1 capsule by mouth daily.        . clopidogrel (PLAVIX) 75 MG tablet TAKE 1 TABLET EVERY DAY  60 tablet  5  . cycloSPORINE (RESTASIS) 0.05 % ophthalmic emulsion Place 1 drop into both eyes 2 (two) times daily.        Marland Kitchen levothyroxine (SYNTHROID, LEVOTHROID) 75 MCG tablet TAKE 1 TABLET EVERY DAY  60 tablet  5  . lisinopril (PRINIVIL,ZESTRIL) 10 MG tablet TAKE 1 TABLET EVERY DAY  60 tablet  5  . Multiple Vitamins-Minerals (WOMENS MULTIVITAMIN PLUS PO) Take 1 tablet by mouth daily.        . meloxicam (MOBIC) 7.5 MG tablet Take 7.5 mg by mouth 2 (two) times daily as needed.        . [DISCONTINUED] clopidogrel (PLAVIX) 75 MG tablet Take 1 tablet (75 mg total) by mouth daily.  60 tablet  5  . [DISCONTINUED] levothyroxine (SYNTHROID, LEVOTHROID) 75 MCG tablet Take 1 tablet (75 mcg total) by mouth daily.  60 tablet  6  . [DISCONTINUED] lisinopril (PRINIVIL,ZESTRIL) 10 MG tablet Take 1 tablet (10 mg total) by mouth daily.  60 tablet  5   Facility-Administered Encounter Medications as of 05/06/2012  Medication Dose Route Frequency Provider Last Rate Last Dose  . [DISCONTINUED] 0.9 %  sodium chloride infusion  500 mL Intravenous Continuous Mardella Layman, MD        No Known Allergies   Current Medications, Allergies, Past Medical History, Past Surgical History, Family History, and Social History were reviewed in [employer] record.   Review of Systems         Constitutional:   No  weight loss, night sweats,  Fevers, chills, fatigue, or  lassitude.  HEENT:   No headaches,  Difficulty swallowing,  Tooth/dental problems, or  Sore throat,                No sneezing, itching, ear ache, nasal congestion, post nasal drip,   CV:  No chest pain,  Orthopnea, PND, swelling in lower extremities, anasarca, dizziness, palpitations, syncope.   GI  No heartburn, indigestion, abdominal pain, nausea, vomiting, diarrhea, change in bowel habits, loss of appetite, bloody stools.   Resp: No shortness of breath with exertion or at rest.  No excess mucus, no productive cough,  No non-productive cough,  No coughing up of blood.  No change in color of mucus.  No wheezing.  No chest wall deformity  Skin: no rash or lesions.  GU: no dysuria, change in color of urine, no urgency or frequency.  No flank pain, no hematuria   MS:  No joint swelling   Psych:  No change in mood or affect. No depression or anxiety.  No memory loss.       Objective:   Physical Exam    WD, WN, 71 y/o BF in NAD.Marland KitchenMarland Kitchen slow moving but only min tremor from Parkinsons... GENERAL:  Alert & oriented; pleasant & cooperative... HEENT:  [place]/AT,   EACs-clear, TMs-wnl, NOSE-clear, THROAT-clear & wnl. NECK:  Supple w/ fairROM; no JVD; normal carotid impulses w/o bruits; no thyromegaly or nodules palpated; no lymphadenopathy. CHEST:  Clear to P & A; without wheezes/ rales/ or rhonchi. HEART:  Regular Rhythm; without murmurs/ rubs/ or gallops. ABDOMEN:  Soft & nontender; normal bowel sounds; no organomegaly or masses detected. EXT: without deformities, mod arthritic changes; no varicose veins/ +venous insuffic/ no edema.  tender along left lumbar sacral area, neg SLR, nml strength bilaterally , nml gait.  NEURO:  CN's intact; min tremor and bradykinesia of Parkinson's; no focal neuro deficits... DERM:  No lesions noted; no rash  etc...   Assessment & Plan:

## 2012-05-06 NOTE — Assessment & Plan Note (Signed)
Low back pain w/ strain  Check xray -LS spine   Plan Advil 200mg  Twice daily  For 5 days -take with food  Alternate ice and heat to back  I will call with xray results.  Please contact office for sooner follow up if symptoms do not improve or worsen or seek emergency care  If not improving will need referral to ortho./back specialist .

## 2012-05-06 NOTE — Patient Instructions (Addendum)
Advil 200mg  Twice daily  For 5 days -take with food  Alternate ice and heat to back  I will call with xray results.  Please contact office for sooner follow up if symptoms do not improve or worsen or seek emergency care  If not improving will need referral to ortho./back specialist .

## 2012-05-08 ENCOUNTER — Telehealth: Payer: Self-pay | Admitting: Adult Health

## 2012-05-08 ENCOUNTER — Telehealth: Payer: Self-pay | Admitting: Pulmonary Disease

## 2012-05-08 DIAGNOSIS — M545 Low back pain: Secondary | ICD-10-CM

## 2012-05-08 NOTE — Progress Notes (Signed)
Quick Note:  Spoke with pt and notified of results per Tammy Parrett. Pt verbalized understanding and denied any questions.  ______ 

## 2012-05-08 NOTE — Telephone Encounter (Signed)
I spoke with pt and she stated she needed [place] to place referral to ortho for her. She stated Dr. Darrelyn Hillock did her previous back surgery. I have placed referral. She needed nothing further

## 2012-05-08 NOTE — Telephone Encounter (Signed)
Notes Recorded by Julio Sicks, NP on 05/07/2012 at 12:25 PM No fx , ? Abnormalities along previous surgery  Would refer to ortho-doc who did previous back surgery if still having back pain Please contact office for sooner follow up if symptoms do not improve or worsen or seek emergency care   Spoke with the pt and notified of results per TP She verbalized understanding and states will call Dr. Darrelyn Hillock for ov

## 2012-05-13 DIAGNOSIS — M48061 Spinal stenosis, lumbar region without neurogenic claudication: Secondary | ICD-10-CM | POA: Diagnosis not present

## 2012-05-13 DIAGNOSIS — M5137 Other intervertebral disc degeneration, lumbosacral region: Secondary | ICD-10-CM | POA: Diagnosis not present

## 2012-05-14 ENCOUNTER — Ambulatory Visit: Payer: Medicare Other | Admitting: Pulmonary Disease

## 2012-05-16 ENCOUNTER — Ambulatory Visit: Payer: Medicare Other | Admitting: Pulmonary Disease

## 2012-05-19 ENCOUNTER — Inpatient Hospital Stay (HOSPITAL_COMMUNITY)
Admission: EM | Admit: 2012-05-19 | Discharge: 2012-05-23 | DRG: 490 | Disposition: A | Discharge status: Skilled Nursing Facility | Payer: Medicare Other | Attending: Orthopedic Surgery | Admitting: Orthopedic Surgery

## 2012-05-19 ENCOUNTER — Inpatient Hospital Stay (HOSPITAL_COMMUNITY): Payer: Medicare Other

## 2012-05-19 ENCOUNTER — Emergency Department (HOSPITAL_COMMUNITY): Payer: Medicare Other

## 2012-05-19 ENCOUNTER — Encounter (HOSPITAL_COMMUNITY): Payer: Self-pay | Admitting: *Deleted

## 2012-05-19 DIAGNOSIS — J9819 Other pulmonary collapse: Secondary | ICD-10-CM | POA: Diagnosis not present

## 2012-05-19 DIAGNOSIS — F411 Generalized anxiety disorder: Secondary | ICD-10-CM | POA: Diagnosis present

## 2012-05-19 DIAGNOSIS — M5126 Other intervertebral disc displacement, lumbar region: Secondary | ICD-10-CM | POA: Diagnosis present

## 2012-05-19 DIAGNOSIS — I10 Essential (primary) hypertension: Secondary | ICD-10-CM | POA: Diagnosis not present

## 2012-05-19 DIAGNOSIS — R5082 Postprocedural fever: Secondary | ICD-10-CM | POA: Diagnosis not present

## 2012-05-19 DIAGNOSIS — G20A1 Parkinson's disease without dyskinesia, without mention of fluctuations: Secondary | ICD-10-CM | POA: Diagnosis present

## 2012-05-19 DIAGNOSIS — E559 Vitamin D deficiency, unspecified: Secondary | ICD-10-CM

## 2012-05-19 DIAGNOSIS — Z01818 Encounter for other preprocedural examination: Secondary | ICD-10-CM

## 2012-05-19 DIAGNOSIS — Z853 Personal history of malignant neoplasm of breast: Secondary | ICD-10-CM | POA: Diagnosis not present

## 2012-05-19 DIAGNOSIS — G20 Parkinson's disease: Secondary | ICD-10-CM | POA: Diagnosis present

## 2012-05-19 DIAGNOSIS — I739 Peripheral vascular disease, unspecified: Secondary | ICD-10-CM

## 2012-05-19 DIAGNOSIS — E039 Hypothyroidism, unspecified: Secondary | ICD-10-CM | POA: Diagnosis not present

## 2012-05-19 DIAGNOSIS — I872 Venous insufficiency (chronic) (peripheral): Secondary | ICD-10-CM | POA: Diagnosis not present

## 2012-05-19 DIAGNOSIS — M4310 Spondylolisthesis, site unspecified: Secondary | ICD-10-CM | POA: Diagnosis not present

## 2012-05-19 DIAGNOSIS — Z9889 Other specified postprocedural states: Secondary | ICD-10-CM | POA: Diagnosis not present

## 2012-05-19 DIAGNOSIS — H409 Unspecified glaucoma: Secondary | ICD-10-CM | POA: Diagnosis not present

## 2012-05-19 DIAGNOSIS — M47817 Spondylosis without myelopathy or radiculopathy, lumbosacral region: Secondary | ICD-10-CM | POA: Diagnosis not present

## 2012-05-19 DIAGNOSIS — M545 Low back pain: Secondary | ICD-10-CM | POA: Diagnosis not present

## 2012-05-19 DIAGNOSIS — Z5189 Encounter for other specified aftercare: Secondary | ICD-10-CM | POA: Diagnosis not present

## 2012-05-19 DIAGNOSIS — M48061 Spinal stenosis, lumbar region without neurogenic claudication: Principal | ICD-10-CM | POA: Diagnosis present

## 2012-05-19 DIAGNOSIS — E785 Hyperlipidemia, unspecified: Secondary | ICD-10-CM | POA: Diagnosis not present

## 2012-05-19 DIAGNOSIS — M810 Age-related osteoporosis without current pathological fracture: Secondary | ICD-10-CM | POA: Diagnosis present

## 2012-05-19 DIAGNOSIS — I119 Hypertensive heart disease without heart failure: Secondary | ICD-10-CM | POA: Diagnosis not present

## 2012-05-19 DIAGNOSIS — M4800 Spinal stenosis, site unspecified: Secondary | ICD-10-CM | POA: Diagnosis not present

## 2012-05-19 LAB — CBC
HCT: 39.6 % (ref 36.0–46.0)
Hemoglobin: 13.3 g/dL (ref 12.0–15.0)
WBC: 6.8 10*3/uL (ref 4.0–10.5)

## 2012-05-19 LAB — COMPREHENSIVE METABOLIC PANEL
AST: 16 U/L (ref 0–37)
Alkaline Phosphatase: 46 U/L (ref 39–117)
BUN: 17 mg/dL (ref 6–23)
CO2: 25 mEq/L (ref 19–32)
Chloride: 107 mEq/L (ref 96–112)
Creatinine, Ser: 0.5 mg/dL (ref 0.50–1.10)
GFR calc non Af Amer: >90 mL/min (ref >90)
Total Bilirubin: 0.6 mg/dL (ref 0.3–1.2)

## 2012-05-19 LAB — CBC WITH DIFFERENTIAL/PLATELET
Eosinophils Absolute: 0 10*3/uL (ref 0.0–0.7)
Lymphocytes Relative: 6 % — ABNORMAL LOW (ref 12–46)
Lymphs Abs: 0.6 10*3/uL — ABNORMAL LOW (ref 0.7–4.0)
Neutro Abs: 7.7 10*3/uL (ref 1.7–7.7)
Neutrophils Relative %: 89 % — ABNORMAL HIGH (ref 43–77)
Platelets: 224 10*3/uL (ref 150–400)
RBC: 4.45 MIL/uL (ref 3.87–5.11)
WBC: 8.6 10*3/uL (ref 4.0–10.5)

## 2012-05-19 LAB — BASIC METABOLIC PANEL
Chloride: 101 mEq/L (ref 96–112)
GFR calc non Af Amer: 83 mL/min — ABNORMAL LOW (ref >90)
Glucose, Bld: 127 mg/dL — ABNORMAL HIGH (ref 70–99)
Potassium: 4.2 mEq/L (ref 3.5–5.1)
Sodium: 137 mEq/L (ref 135–145)

## 2012-05-19 LAB — DIFFERENTIAL
Basophils Absolute: 0 10*3/uL (ref 0.0–0.1)
Lymphocytes Relative: 13 % (ref 12–46)
Monocytes Absolute: 0.3 10*3/uL (ref 0.1–1.0)
Monocytes Relative: 4 % (ref 3–12)
Neutro Abs: 5.6 10*3/uL (ref 1.7–7.7)

## 2012-05-19 LAB — PROTIME-INR: INR: 1.1 (ref 0.00–1.49)

## 2012-05-19 MED ORDER — CARBIDOPA-LEVODOPA 25-100 MG PO TABS
1.5000 | ORAL_TABLET | Freq: Four times a day (QID) | ORAL | Status: DC
  Administered 2012-05-19 – 2012-05-23 (×11): 1.5 via ORAL
  Filled 2012-05-19 (×18): qty 1.5

## 2012-05-19 MED ORDER — PREDNISONE (PAK) 5 MG PO TABS: 5.0000 mg | ORAL_TABLET | ORAL | Status: DC

## 2012-05-19 MED ORDER — SODIUM CHLORIDE 0.9 % IV SOLN
INTRAVENOUS | Status: DC
  Administered 2012-05-19: 13:00:00 via INTRAVENOUS

## 2012-05-19 MED ORDER — LEVOTHYROXINE SODIUM 75 MCG PO TABS
75.0000 ug | ORAL_TABLET | Freq: Every day | ORAL | Status: DC
  Administered 2012-05-20 – 2012-05-23 (×4): 75 ug via ORAL
  Filled 2012-05-19 (×6): qty 1

## 2012-05-19 MED ORDER — PNEUMOCOCCAL VAC POLYVALENT 25 MCG/0.5ML IJ INJ
0.5000 mL | INJECTION | INTRAMUSCULAR | Status: DC
  Filled 2012-05-19: qty 0.5

## 2012-05-19 MED ORDER — LISINOPRIL 10 MG PO TABS
10.0000 mg | ORAL_TABLET | Freq: Every day | ORAL | Status: DC
  Administered 2012-05-21 – 2012-05-23 (×3): 10 mg via ORAL
  Filled 2012-05-19 (×4): qty 1

## 2012-05-19 MED ORDER — FLEET ENEMA 7-19 GM/118ML RE ENEM: 1.0000 | ENEMA | Freq: Once | RECTAL | Status: AC | PRN

## 2012-05-19 MED ORDER — HYDROCODONE-ACETAMINOPHEN 5-325 MG PO TABS
1.0000 | ORAL_TABLET | Freq: Four times a day (QID) | ORAL | Status: DC | PRN
  Administered 2012-05-20: 1 via ORAL
  Filled 2012-05-19: qty 1

## 2012-05-19 MED ORDER — CARBIDOPA-LEVODOPA-ENTACAPONE 37.5-150-200 MG PO TABS: 1.0000 | ORAL_TABLET | Freq: Every day | ORAL | Status: DC | PRN

## 2012-05-19 MED ORDER — CYCLOSPORINE 0.05 % OP EMUL
1.0000 [drp] | Freq: Two times a day (BID) | OPHTHALMIC | Status: DC
  Administered 2012-05-19 – 2012-05-23 (×7): 1 [drp] via OPHTHALMIC
  Filled 2012-05-19 (×9): qty 1

## 2012-05-19 MED ORDER — LORAZEPAM 2 MG/ML IJ SOLN
1.0000 mg | Freq: Once | INTRAMUSCULAR | Status: AC
  Administered 2012-05-19: 1 mg via INTRAVENOUS
  Filled 2012-05-19: qty 1

## 2012-05-19 MED ORDER — GADOBENATE DIMEGLUMINE 529 MG/ML IV SOLN
11.0000 mL | Freq: Once | INTRAVENOUS | Status: AC | PRN
  Administered 2012-05-19: 11 mL via INTRAVENOUS

## 2012-05-19 MED ORDER — POLYETHYLENE GLYCOL 3350 17 G PO PACK: 17.0000 g | PACK | Freq: Every day | ORAL | Status: DC | PRN

## 2012-05-19 MED ORDER — LACTATED RINGERS IV SOLN
INTRAVENOUS | Status: DC
  Administered 2012-05-19 – 2012-05-20 (×3): via INTRAVENOUS

## 2012-05-19 MED ORDER — PANTOPRAZOLE SODIUM 40 MG PO TBEC
40.0000 mg | DELAYED_RELEASE_TABLET | Freq: Every day | ORAL | Status: DC
  Administered 2012-05-22 – 2012-05-23 (×2): 40 mg via ORAL
  Filled 2012-05-19 (×4): qty 1

## 2012-05-19 MED ORDER — ENTACAPONE 200 MG PO TABS
200.0000 mg | ORAL_TABLET | Freq: Four times a day (QID) | ORAL | Status: DC
  Administered 2012-05-19 – 2012-05-23 (×11): 200 mg via ORAL
  Filled 2012-05-19 (×17): qty 1

## 2012-05-19 MED ORDER — BISACODYL 10 MG RE SUPP
10.0000 mg | Freq: Every day | RECTAL | Status: DC | PRN
Start: 2012-05-19 — End: 2012-05-20

## 2012-05-19 MED ORDER — METOPROLOL SUCCINATE ER 50 MG PO TB24
50.0000 mg | ORAL_TABLET | Freq: Every day | ORAL | Status: DC
  Filled 2012-05-19: qty 1

## 2012-05-19 NOTE — ED Provider Notes (Signed)
History     CSN: [id number]  Arrival date & time 05/19/12  1044   First MD Initiated Contact with Patient 05/19/12 1207      Chief Complaint  Patient presents with  . Back Pain    (Consider location/radiation/quality/duration/timing/severity/associated sxs/prior treatment) Patient is a 71 y.o. female presenting with back pain. The history is provided by the patient.  Back Pain    patient here with lower back pain or trouble walking x2 days. No recent injury that preceded her symptoms. Denies any change in bowel or bladder habits. Saw her Dr. for this last week and was scribed in prednisone and Tylenol in symptoms have not been improving. She now has to use a cane and walker to get around. Denies numbness to her legs. Does have a history of prior back surgery and when she saw her doctor she was diagnosed of having worsening osteoarthritis.  Past Medical History  Diagnosis Date  . Glaucoma(365)   . Abnormal chest x-ray   . Hypertension   . Peripheral vascular disease   . Venous insufficiency   . Hypercholesteremia   . Hypothyroid   . Diverticulosis of colon   . DJD (degenerative joint disease)   . Hip pain   . Lumbar back pain   . Osteopenia   . Vitamin D deficiency   . Parkinson disease   . Anxiety   . Breast cancer     stage 0 left    Past Surgical History  Procedure Date  . Ovarian cyst removal   . Lapraoscopic cholecystectomy 09/2003    Dr. Derrell Lolling  . Lumbar laminectomy and foraminotomies for spinal stenosis 11/2005    Dr. Darrelyn Hillock  . Cataract surgery and argon laser trabeculoplasty 04/2010    Dr. Elmer Picker  . Colonoscopy   . Polypectomy   . Cholecystectomy   . Breast lumpectomy 8/11 by DrWakefield  . Incise and drain abcess 10/04/11    sebaceous cyst on back    Family History  Problem Relation Age of Onset  . Colon cancer Father     History  Substance Use Topics  . Smoking status: Never Smoker   . Smokeless tobacco: Never Used  . Alcohol Use: No    OB  History    Grav Para Term Preterm Abortions TAB SAB Ect Mult Living                  Review of Systems  Musculoskeletal: Positive for back pain.  All other systems reviewed and are negative.    Allergies  Review of patient's allergies indicates no known allergies.  Home Medications   Current Outpatient Rx  Name  Route  Sig  Dispense  Refill  . ACETAMINOPHEN 500 MG PO TABS   Oral   Take 1,000 mg by mouth every 6 (six) hours as needed. Pain         . ASPIRIN 81 MG PO TABS   Oral   Take 81 mg by mouth daily.           Marland Kitchen CALCIUM CARBONATE-VITAMIN D 600-400 MG-UNIT PO TABS   Oral   Take 1 tablet by mouth 2 (two) times daily.           Marland Kitchen CARBIDOPA-LEVODOPA-ENTACAPONE 37.5-150-200 MG PO TABS   Oral   Take 1 tablet by mouth 5 (five) times daily as needed. As directed by Dr. Misty Stanley          . VITAMIN D 2000 UNITS PO CAPS   Oral  Take 1 capsule by mouth daily.           Marland Kitchen CLOPIDOGREL BISULFATE 75 MG PO TABS      TAKE 1 TABLET EVERY DAY   60 tablet   5   . CYCLOSPORINE 0.05 % OP EMUL   Both Eyes   Place 1 drop into both eyes 2 (two) times daily.           Marland Kitchen LEVOTHYROXINE SODIUM 75 MCG PO TABS      TAKE 1 TABLET EVERY DAY   60 tablet   5   . LISINOPRIL 10 MG PO TABS      TAKE 1 TABLET EVERY DAY   60 tablet   5   . WOMENS MULTIVITAMIN PLUS PO   Oral   Take 1 tablet by mouth daily.           Marland Kitchen PREDNISONE (PAK) 5 MG PO TABS   Oral   Take 5 mg by mouth as directed.           BP 111/62  Pulse 69  Temp 98.2 F (36.8 C) (Oral)  Resp 16  SpO2 98%  Physical Exam  Nursing note and vitals reviewed. Constitutional: She is oriented to person, place, and time. She appears well-developed and well-nourished.  Non-toxic appearance. No distress.  HENT:  Head: Normocephalic and atraumatic.  Eyes: Conjunctivae normal, EOM and lids are normal. Pupils are equal, round, and reactive to light.  Neck: Normal range of motion. Neck supple. No tracheal  deviation present. No mass present.  Cardiovascular: Normal rate, regular rhythm and normal heart sounds.  Exam reveals no gallop.   No murmur heard. Pulmonary/Chest: Effort normal and breath sounds normal. No stridor. No respiratory distress. She has no decreased breath sounds. She has no wheezes. She has no rhonchi. She has no rales.  Abdominal: Soft. Normal appearance and bowel sounds are normal. She exhibits no distension. There is no tenderness. There is no rebound and no CVA tenderness.  Musculoskeletal: Normal range of motion. She exhibits no edema and no tenderness.  Neurological: She is alert and oriented to person, place, and time. She has normal strength. No cranial nerve deficit or sensory deficit. GCS eye subscore is 4. GCS verbal subscore is 5. GCS motor subscore is 6.  Skin: Skin is warm and dry. No abrasion and no rash noted.  Psychiatric: She has a normal mood and affect. Her speech is normal and behavior is normal.    ED Course  Procedures (including critical care time)   Labs Reviewed  CBC WITH DIFFERENTIAL  BASIC METABOLIC PANEL   No results found.   No diagnosis found.    MDM  Pt given meds for pain and spoke with  dr. Ciro Backer, he will come to see        Toy Baker, MD 05/19/12 1556

## 2012-05-19 NOTE — Consult Note (Signed)
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 Welton Flakes) presented to the ED with severe low back pain with difficulty walking for past 2 days. She started having worsening low back pain about 2 weeks back when she saw Dr. Pilar Grammes  and was prescribed Prevpac one week back. She informs that her pain started getting worse 2 days back and was unable to walk with frequent falls. She complains off worsening low back pain without any radiation to the legs. She denies any tingling or numbness. She denies any bowel or urinary symptoms. Patient denies any chest pain, palpitations, shortness of breath, abdominal pain, nausea, headache fever or vomiting. At baseline patient is able to walk for almost 30 minutes on a flat surface. She is also able to climb 2 flights of stairs without being tired or having pain.  An MRI of the lumbar spine with and without contrast done in the ED today showed severe L2-L3 central stenosis associated with left paracentral disc extrusion and dependency of posterior ligamentum flavum. His final stenosis involving L3-L4 and L4-L5. Patient admitted by Dr. Tinnie Gens for surgery likely on 11/27. Given her underlying medical condition prior hospitalist called for a preop medical clearance.  Allergies:  No Known Allergies    Past Medical History  Diagnosis Date  . Glaucoma(365)   . Abnormal chest x-ray   . Hypertension   . Peripheral vascular disease   . Venous insufficiency   . Hypercholesteremia   . Hypothyroid   . Diverticulosis of colon   . DJD (degenerative joint  disease)   . Hip pain   . Lumbar back pain   . Osteopenia   . Vitamin D deficiency   . Parkinson disease   . Anxiety   . Breast cancer     stage 0 left    Past Surgical History  Procedure Date  . Ovarian cyst removal   . Lapraoscopic cholecystectomy 09/2003    Dr. Derrell Lolling  . Lumbar laminectomy and foraminotomies for spinal stenosis 11/2005    Dr. Darrelyn Hillock  . Cataract surgery and argon laser trabeculoplasty 04/2010    Dr. Elmer Picker  . Colonoscopy   . Polypectomy   . Cholecystectomy   . Breast lumpectomy 8/11 by DrWakefield  . Incise and drain abcess 10/04/11    sebaceous cyst on back    Medications:  Scheduled Meds:   . [COMPLETED] LORazepam  1 mg Intravenous Once  . pneumococcal 23 valent vaccine  0.5 mL Intramuscular Tomorrow-1000   Continuous Infusions:   . sodium chloride 125 mL/hr at 05/19/12 1258   PRN Meds:.[COMPLETED] gadobenate dimeglumine  Social History:  reports that she has never smoked. She has never used smokeless tobacco. She reports that she does not drink alcohol or use illicit drugs.  Family History  Problem Relation Age of Onset  . Colon cancer Father     Review of Systems:  Constitutional: Denies fever, chills, diaphoresis, appetite change and fatigue.  HEENT: Denies photophobia, eye pain, redness, hearing loss, ear pain, congestion, sore throat, rhinorrhea, sneezing, mouth sores, trouble swallowing, neck pain, neck stiffness and tinnitus.   Respiratory: Denies SOB, DOE, cough, chest tightness,  and wheezing.   Cardiovascular:  Denies chest pain, palpitations and leg swelling.  Gastrointestinal: Denies nausea, vomiting, abdominal pain, diarrhea, constipation, blood in stool and abdominal distention.  Genitourinary: Denies dysuria, urgency, frequency, hematuria, flank pain and difficulty urinating.  Musculoskeletal: severe low back pain with lower extremity weakness and gait disturbance and frequent falls. Denies myalgias,  joint swelling, arthralgias  .  Skin: Denies pallor, rash and wound.  Neurological: Denies dizziness, seizures, syncope, weakness, light-headedness, numbness and headaches.  Hematological: Denies adenopathy. Easy bruising, personal or family bleeding history  Psychiatric/Behavioral: Denies suicidal ideation, mood changes, confusion, nervousness, sleep disturbance and agitation   Physical Exam:  Filed Vitals:   05/19/12 1518 05/19/12 1600 05/19/12 1747 05/19/12 1930  BP: 103/56 119/56 125/68 134/65  Pulse: 60 58 54 61  Temp:   98.5 F (36.9 C)   TempSrc:   Oral   Resp: 18  14 16   SpO2: 96% 97% 99% 96%    No intake or output data in the 24 hours ending 05/19/12 1952  General: elderly female lying in bed in NAD. Appears fatigued. No acute distress HEENT: no pallor, moist oral mucosa , no lymphadenopathy Heart: Regular rate and rhythm, without murmurs, rubs, gallops. Lungs: Clear to auscultation bilaterally. Abdomen: Soft, nontender, nondistended, positive bowel sounds. Extremities: tender to palpation over low back. Diminished strength over bilateral lower extremities ( 4/5). Good tone and reflexes. Normal sensations. Cool mextremities ( likely from PVD) , distal pulses feeble ( good pulses checked with doppler at bedside) No clubbing cyanosis or edema . Gait not assessed as patient unable to get up to walk due to pain. Neuro: AAOX 3 , Grossly intact, non focal except as mentioned above.  Labs on Admission:  CBC:    Component Value Date/Time   WBC 8.6 05/19/2012 1300   WBC 3.3* 12/06/2011 1312   HGB 14.1 05/19/2012 1300   HGB 13.0 12/06/2011 1312   HCT 41.0 05/19/2012 1300   HCT 39.4 12/06/2011 1312   PLT 224 05/19/2012 1300   PLT 183 12/06/2011 1312   MCV 92.1 05/19/2012 1300   MCV 95.0 12/06/2011 1312   NEUTROABS 7.7 05/19/2012 1300   NEUTROABS 2.2 12/06/2011 1312   LYMPHSABS 0.6* 05/19/2012 1300   LYMPHSABS 0.9 12/06/2011 1312   MONOABS 0.3 05/19/2012 1300   MONOABS 0.2 12/06/2011 1312   EOSABS 0.0  05/19/2012 1300   EOSABS 0.0 12/06/2011 1312   BASOSABS 0.0 05/19/2012 1300   BASOSABS 0.0 12/06/2011 1312    Basic Metabolic Panel:    Component Value Date/Time   NA 137 05/19/2012 1300   K 4.2 05/19/2012 1300   CL 101 05/19/2012 1300   CO2 26 05/19/2012 1300   BUN 22 05/19/2012 1300   CREATININE 0.72 05/19/2012 1300   GLUCOSE 127* 05/19/2012 1300   CALCIUM 9.5 05/19/2012 1300    Radiological Exams on Admission: Mr Lumbar Spine W Wo Contrast  05/19/2012  *RADIOLOGY REPORT*  Clinical Data: Progressively worsening lower extremity weakness. Inability to ambulate or weightbear.  History of breast cancer. Lumbar spine operation 2007.  MRI LUMBAR SPINE WITHOUT AND WITH CONTRAST  Technique:  Multiplanar and multiecho pulse sequences of the lumbar spine were obtained without and with intravenous contrast.  Contrast: 11mL MULTIHANCE GADOBENATE DIMEGLUMINE 529 MG/ML IV SOLN  Comparison: None.  Findings: Numbering convention used on prior exam preserved. Severe multilevel lumbar spondylosis.  Spinal cord terminates posterior to L2.  Vertebral body height preserved.  There is a dextroconvex lumbar scoliosis with the apex at L3.  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 Bone marrow signal shows suppression of fatty marrow, which is a nonspecific finding, commonly associated with anemia, chronic disease, cigarette smoking, and obesity.  Lower thoracic levels show an adequate patency of the central canal.  Disc desiccation is present throughout.  L1-L2:  Disc desiccation.  No stenosis.  L2-L3:  There is severe multifactorial central stenosis.  This is better seen on the sagittal images than on axial images.  Bunching of the nerve roots is present proximally.  L2-L3 disc degeneration with broad-based posterior bulging and left paracentral extrusion with both cranial and caudal migration of disc material.  Posterior ligamentum flavum redundancy contributes to the central stenosis. On sagittal imaging, there  appears to be compression of the cauda equina.  Mild bilateral foraminal stenosis associated with bulging disc.  Left lateral recess stenosis associated with ligamentum flavum redundancy and extrusion.  L3-L4:  Severe disc desiccation.  Wide posterior decompression. Grade 1 anterolisthesis of L3 on L4 measuring 3 mm.  Vacuum disc. Moderate left foraminal stenosis.  Right foramen patent. Laminectomy.  L4-L5:  Grade 1 anterolisthesis measuring 6 mm.  Laminectomy with wide posterior decompression.  No recurrent central or lateral recess stenosis.  Severe left foraminal stenosis is multifactorial. Right foramen appears patent.  Bilateral facet arthrosis accounts for anterolisthesis.  High T2 signal is present within the disc space, compatible with vacuum disc rather than fluid.  L5-S1:  Disc desiccation.  Small vacuum disc.  Shallow broad-based disc bulging.  Central canal and lateral recesses are patent. Moderate right and mild left foraminal stenosis is multifactorial. Right greater than left facet arthrosis.  After Gadolinium administration, there is enhancement of the extruded L2-L3 disc periphery, with central nonenhancement.  A large enhancing fibroid is present in the anatomic pelvis.  IMPRESSION:  1.  Severe L2-L3 central stenosis associated with left paracentral disc extrusion and posterior ligamentum flavum redundancy and facet hypertrophy. Left lateral recess stenosis. Mild bilateral foraminal stenosis. 2.  Postoperative changes with decompression of the central canal L3-L4 and L4-L5.  Residual/recurrent foraminal stenosis as described above, potentially affecting the right L3 and L4 nerves. 3.  L5-S1 facet arthrosis with right greater than left foraminal stenosis.   Original Report Authenticated By: Andreas Newport, M.D.    Dg Chest Portable 1 View  05/19/2012  *RADIOLOGY REPORT*  Clinical Data: Shortness of breath and weakness.  PORTABLE CHEST - 1 VIEW  Comparison: 11/08/2010.  Findings: 1855 hours.   There is chronic elevation of the right hemidiaphragm with associated chronic right basilar atelectasis. Overall appearance is similar.  No superimposed airspace disease, edema or significant pleural effusion is seen.  Heart size and mediastinal contours are stable.  IMPRESSION: Stable chronic elevation of the right hemidiaphragm and right basilar atelectasis.  No acute cardiopulmonary process identified.   Original Report Authenticated By: Carey Bullocks, M.D.     Assessment/Plan  Severe lumbar stenosis -Management per  primary team in terms of pain control and surgery. Plan on surgery likely on 11/27. -Patient is on aspirin and Plavix for peripheral vascular disease and would recommend to discontinue it for now. -Patient is undergoing intermediate risk surgery and as per revised Goldman cardiac risk index she does not have any risk of ischemic heart disease, diabetes mellitus , history of heart failure, history of CVA and has normal renal function. Based on this her intraoperative and postoperative cardiac complications are very low (0.4%). She does not need any further preoperative cardiac workup at this time.  -Patient's EKG shows sinus rhythm without  any ST-T changes and appears unchanged compared to her last EKG 2 years back. -Patient at baseline has an MET greater than 4. -Patient does have underlying peripheral vascular disease and hyperlipidemia. Given her age and his underlying medical issues she would benefit from perioperative beta blocker if the heart rate permits.  i will start her on a low dose metoprolol 50 mg po daily.  -I Will also add po protonix for GI prophylaxis for surgery and given patients also on steroids. -monitor fsg while patient on steroids.  Parkinson's disease Continue with stalevo  Peripheral vascular disease. Will hold her aspirin and Plavix   Hypothyroidism Continue Synthroid  Hypertension  continue lisinopril  Osteoporosis  continue  cholecalciferol  Hx of glaucoma Continue cyclosporin drops  DVT prophylaxis: SCD   Family at bedside updated with plan.  Thank you for the consultation. hospitalist team will continue to follow.    Time Spent 70 minutes   Casin Federici 05/19/2012, 7:52 PM

## 2012-05-19 NOTE — ED Notes (Signed)
Pt returned from MRI °

## 2012-05-19 NOTE — Care Management ED Note (Signed)
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 pt pcp [facility] attending [facility] Gioffre Ad[facility] and evaluate for Decompressive Lumbar Laminectomy at L-2-L-3 for severe Spinal Stenosis     Subjective/Objective Assessment Detail:   Family supportive-husband, son, dtr     Action/Plan:   Spoke with son ans husband at bedside about cm referral for possible home health services at d/c, chs therapy evaluation with d/c recommendations   Action/Plan Detail:   Anticipated DC Date:  05/22/2012     Status Recommendation to Physician:   Result of Recommendation:    Other ED Services  Consult Working Plan    DC Planning Services  Other    Choice offered to / List presented to:            Status of service:  Completed, signed off  ED Comments:   ED Comments Detail:  05/19/12 1840 Kimberly L Gibbs, RN, CCM [phone] Pt asleep Spoke to family Provided list of [place] home health agencies to assist with making choice of agency if home health ordered at d/c Husband and son want to consult with dtr who works at [employer] about agency and contact unit CM with choice     CARE MANAGEMENT ED NOTE 05/19/2012  Patient:  AAMARI, WEST   Account Number:  [id number]  Date Initiated:  05/19/2012  Documentation initiated by:  Edd Arbour  Subjective/Objective Assessment:   71 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 pt pcp [facility] attending Jacki Cones Admit and evaluate for Decompressive Lumbar Laminectomy at L-2-L-3 for severe Spinal Stenosis     Subjective/Objective Assessment Detail:   Family supportive-husband, son, dtr     Action/Plan:   Spoke with son ans husband at bedside about cm referral for possible home health services at d/c, chs therapy evaluation with d/c recommendations   Action/Plan Detail:   Anticipated DC Date:  05/22/2012     Status Recommendation to Physician:   Result of Recommendation:    Other ED Services  Consult Working Plan    DC Planning Services  Other    Choice offered to / List presented to:            Status of service:  Completed, signed off  ED Comments:   ED Comments Detail:  05/19/12 1840 Clinton Gallant, RN, CCM [phone] Pt asleep Spoke to family Provided list of [place] home health agencies to assist with making choice of agency if home health ordered at d/c Husband and son want to consult with dtr who works at [employer] about agency and contact unit CM with choice

## 2012-05-19 NOTE — ED Notes (Signed)
Patient transported to MRI 

## 2012-05-19 NOTE — Progress Notes (Signed)
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 pt pcp [facility] attending Jacki Cones Admit and evaluate for Decompressive Lumbar Laminectomy at L-2-L-3 for severe Spinal Stenosis     Subjective/Objective Assessment Detail:   Family supportive-husband, son, dtr     Action/Plan:   Spoke with son ans husband at bedside about cm referral for possible home health services at d/c, chs therapy evaluation with d/c recommendations   Action/Plan Detail:   Anticipated DC Date:  05/22/2012     Status Recommendation to Physician:   Result of Recommendation:    Other ED Services  Consult Working Plan    DC Planning Services  Other    Choice offered to / List presented to:            Status of service:  Completed, signed off  ED Comments:   ED Comments Detail:  05/19/12 1840 Clinton Gallant, RN, CCM [phone] Pt asleep Spoke to family Provided list of [place] home health agencies to assist with making choice of agency if home health ordered at d/c Husband and son want to consult with dtr who works at [employer] about agency and contact unit CM with choice

## 2012-05-19 NOTE — ED Notes (Signed)
Pt states had back surgery on 12/05/05 by Dr. Darrelyn Hillock, went to see him on 11/19 d/t pain in her hips, had an xray done that revealed more arthritis, was prescribed prednisone and tylenol, then this past Saturday when she tried to go to the bathroom states she could not walk alone or with a walker, pt states has fallen multiple times walking w/o assistance. Pt states she cannot walk d/t back pain and legs are weak.

## 2012-05-19 NOTE — H&P (Signed)
[name] is an 71 y.o. female.   Chief Complaint: Weakness of both Lowers. HPI: Patient was recently seen in office and was ambulating. According to her family the last few days her legs have been giving out. She was brought to the ER with this history. I did  A two level Decompressive Lumbar Laminectomy about six years ago and she did very well since then.She was started on a Prednisone Dosepak about one week ago.  Past Medical History  Diagnosis Date  . Glaucoma(365)   . Abnormal chest x-ray   . Hypertension   . Peripheral vascular disease   . Venous insufficiency   . Hypercholesteremia   . Hypothyroid   . Diverticulosis of colon   . DJD (degenerative joint disease)   . Hip pain   . Lumbar back pain   . Osteopenia   . Vitamin D deficiency   . Parkinson disease   . Anxiety   . Breast cancer     stage 0 left    Past Surgical History  Procedure Date  . Ovarian cyst removal   . Lapraoscopic cholecystectomy 09/2003    Dr. Derrell Lolling  . Lumbar laminectomy and foraminotomies for spinal stenosis 11/2005    Dr. Darrelyn Hillock  . Cataract surgery and argon laser trabeculoplasty 04/2010    Dr. Elmer Picker  . Colonoscopy   . Polypectomy   . Cholecystectomy   . Breast lumpectomy 8/11 by DrWakefield  . Incise and drain abcess 10/04/11    sebaceous cyst on back    Family History  Problem Relation Age of Onset  . Colon cancer Father    Social History:  reports that she has never smoked. She has never used smokeless tobacco. She reports that she does not drink alcohol or use illicit drugs.  Allergies: No Known Allergies   (Not in a hospital admission)  Results for orders placed during the hospital encounter of 05/19/12 (from the past 48 hour(s))  CBC WITH DIFFERENTIAL     Status: Abnormal   Collection Time   05/19/12  1:00 PM      Component Value Range Comment   WBC 8.6  4.0 - 10.5 K/uL    RBC 4.45  3.87 - 5.11 MIL/uL    Hemoglobin 14.1  12.0 - 15.0 g/dL    HCT 16.1  09.6 - 04.5 %    MCV 92.1  78.0 - 100.0 fL    MCH 31.7  26.0 - 34.0 pg    MCHC 34.4  30.0 - 36.0 g/dL    RDW 40.9  81.1 - 91.4 %    Platelets 224  150 - 400 K/uL    Neutrophils Relative 89 (*) 43 - 77 %    Neutro Abs 7.7  1.7 - 7.7 K/uL    Lymphocytes Relative 6 (*) 12 - 46 %    Lymphs Abs 0.6 (*) 0.7 - 4.0 K/uL    Monocytes Relative 4  3 - 12 %    Monocytes Absolute 0.3  0.1 - 1.0 K/uL    Eosinophils Relative 0  0 - 5 %    Eosinophils Absolute 0.0  0.0 - 0.7 K/uL    Basophils Relative 0  0 - 1 %    Basophils Absolute 0.0  0.0 - 0.1 K/uL   BASIC METABOLIC PANEL     Status: Abnormal   Collection Time   05/19/12  1:00 PM      Component Value Range Comment   Sodium 137  135 - 145 mEq/L  Potassium 4.2  3.5 - 5.1 mEq/L    Chloride 101  96 - 112 mEq/L    CO2 26  19 - 32 mEq/L    Glucose, Bld 127 (*) 70 - 99 mg/dL    BUN 22  6 - 23 mg/dL    Creatinine, Ser 1.61  0.50 - 1.10 mg/dL    Calcium 9.5  8.4 - 09.6 mg/dL    GFR calc non Af Amer 83 (*) >90 mL/min    GFR calc Af Amer >90  >90 mL/min    Mr Lumbar Spine W Wo Contrast  05/19/2012  *RADIOLOGY REPORT*  Clinical Data: Progressively worsening lower extremity weakness. Inability to ambulate or weightbear.  History of breast cancer. Lumbar spine operation 2007.  MRI LUMBAR SPINE WITHOUT AND WITH CONTRAST  Technique:  Multiplanar and multiecho pulse sequences of the lumbar spine were obtained without and with intravenous contrast.  Contrast: 11mL MULTIHANCE GADOBENATE DIMEGLUMINE 529 MG/ML IV SOLN  Comparison: None.  Findings: Numbering convention used on prior exam preserved. Severe multilevel lumbar spondylosis.  Spinal cord terminates posterior to L2.  Vertebral body height preserved.  There is a dextroconvex lumbar scoliosis with the apex at L3.  Bilateral renal sinus cysts are present. Bone marrow signal shows suppression of fatty marrow, which is a nonspecific finding, commonly associated with anemia, chronic disease, cigarette smoking, and obesity.   Lower thoracic levels show an adequate patency of the central canal.  Disc desiccation is present throughout.  L1-L2:  Disc desiccation.  No stenosis.  L2-L3:  There is severe multifactorial central stenosis.  This is better seen on the sagittal images than on axial images.  Bunching of the nerve roots is present proximally.  L2-L3 disc degeneration with broad-based posterior bulging and left paracentral extrusion with both cranial and caudal migration of disc material.  Posterior ligamentum flavum redundancy contributes to the central stenosis. On sagittal imaging, there appears to be compression of the cauda equina.  Mild bilateral foraminal stenosis associated with bulging disc.  Left lateral recess stenosis associated with ligamentum flavum redundancy and extrusion.  L3-L4:  Severe disc desiccation.  Wide posterior decompression. Grade 1 anterolisthesis of L3 on L4 measuring 3 mm.  Vacuum disc. Moderate left foraminal stenosis.  Right foramen patent. Laminectomy.  L4-L5:  Grade 1 anterolisthesis measuring 6 mm.  Laminectomy with wide posterior decompression.  No recurrent central or lateral recess stenosis.  Severe left foraminal stenosis is multifactorial. Right foramen appears patent.  Bilateral facet arthrosis accounts for anterolisthesis.  High T2 signal is present within the disc space, compatible with vacuum disc rather than fluid.  L5-S1:  Disc desiccation.  Small vacuum disc.  Shallow broad-based disc bulging.  Central canal and lateral recesses are patent. Moderate right and mild left foraminal stenosis is multifactorial. Right greater than left facet arthrosis.  After Gadolinium administration, there is enhancement of the extruded L2-L3 disc periphery, with central nonenhancement.  A large enhancing fibroid is present in the anatomic pelvis.  IMPRESSION:  1.  Severe L2-L3 central stenosis associated with left paracentral disc extrusion and posterior ligamentum flavum redundancy and facet hypertrophy.  Left lateral recess stenosis. Mild bilateral foraminal stenosis. 2.  Postoperative changes with decompression of the central canal L3-L4 and L4-L5.  Residual/recurrent foraminal stenosis as described above, potentially affecting the right L3 and L4 nerves. 3.  L5-S1 facet arthrosis with right greater than left foraminal stenosis.   Original Report Authenticated By: Andreas Newport, M.D.     Review of Systems  Constitutional: Positive for malaise/fatigue.  HENT: Negative.   Eyes: Negative.   Respiratory: Negative.   Cardiovascular: Negative.   Gastrointestinal: Negative.   Genitourinary: Negative.   Musculoskeletal: Positive for back pain and falls.  Skin: Negative.   Neurological: Positive for focal weakness.  Endo/Heme/Allergies: Negative.   Psychiatric/Behavioral: Negative.     Blood pressure 119/56, pulse 58, temperature 98.2 F (36.8 C), temperature source Oral, resp. rate 18, SpO2 97.00%. Physical Exam  Constitutional: She appears well-developed. She appears lethargic.  HENT:  Head: Normocephalic.  Eyes: Pupils are equal, round, and reactive to light.  Neck: Normal range of motion.  Cardiovascular: Normal rate.   Respiratory: Effort normal.  GI: Soft.  Musculoskeletal:       Right hip: She exhibits decreased strength.       Right upper leg: Normal.  Neurological: She appears lethargic. She exhibits abnormal muscle tone. Coordination and gait abnormal.       Weakness of both lowers.She is lethargic secondary to Medications given to her prior to her MRI. MRI reveals Stenosis of L-2-L-3  Skin: Skin is warm.  Psychiatric: She has a normal mood and affect.     Assessment/Plan Admit and evaluate for Decompressive Lumbar Laminectomy at L-2-L-3 for severe Spinal Stenosis.Will need Dr. Jairo Ben medical clearance  Keenan Trefry A 05/19/2012, 5:35 PM

## 2012-05-19 NOTE — ED Notes (Signed)
MD at bedside. 

## 2012-05-20 ENCOUNTER — Encounter (HOSPITAL_COMMUNITY): Payer: Self-pay | Admitting: Anesthesiology

## 2012-05-20 ENCOUNTER — Inpatient Hospital Stay (HOSPITAL_COMMUNITY): Payer: Medicare Other | Admitting: Anesthesiology

## 2012-05-20 ENCOUNTER — Inpatient Hospital Stay (HOSPITAL_COMMUNITY): Payer: Medicare Other

## 2012-05-20 ENCOUNTER — Encounter (HOSPITAL_COMMUNITY)
Admission: EM | Disposition: A | Discharge status: Skilled Nursing Facility | Payer: Self-pay | Source: Home / Self Care | Attending: Orthopedic Surgery

## 2012-05-20 DIAGNOSIS — H409 Unspecified glaucoma: Secondary | ICD-10-CM

## 2012-05-20 DIAGNOSIS — E559 Vitamin D deficiency, unspecified: Secondary | ICD-10-CM

## 2012-05-20 HISTORY — PX: LUMBAR LAMINECTOMY/DECOMPRESSION MICRODISCECTOMY: SHX5026

## 2012-05-20 LAB — LIPID PANEL
Cholesterol: 145 mg/dL (ref 0–200)
HDL: 64 mg/dL (ref >39)
Total CHOL/HDL Ratio: 2.3 RATIO
VLDL: 12 mg/dL (ref 0–40)

## 2012-05-20 LAB — SURGICAL PCR SCREEN
MRSA, PCR: NEGATIVE
Staphylococcus aureus: NEGATIVE

## 2012-05-20 LAB — HEMOGLOBIN A1C: Mean Plasma Glucose: 131 mg/dL — ABNORMAL HIGH (ref <117)

## 2012-05-20 SURGERY — LUMBAR LAMINECTOMY/DECOMPRESSION MICRODISCECTOMY 2 LEVELS
Anesthesia: General | Site: Back | Wound class: Clean

## 2012-05-20 MED ORDER — ACETAMINOPHEN 10 MG/ML IV SOLN
INTRAVENOUS | Status: DC | PRN
  Administered 2012-05-20: 1000 mg via INTRAVENOUS

## 2012-05-20 MED ORDER — SODIUM CHLORIDE 0.9 % IR SOLN
Status: DC | PRN
  Administered 2012-05-20: 17:00:00

## 2012-05-20 MED ORDER — LACTATED RINGERS IV SOLN
INTRAVENOUS | Status: DC
  Administered 2012-05-20 – 2012-05-21 (×2): via INTRAVENOUS

## 2012-05-20 MED ORDER — OXYCODONE-ACETAMINOPHEN 5-325 MG PO TABS: 1.0000 | ORAL_TABLET | ORAL | Status: DC | PRN

## 2012-05-20 MED ORDER — ONDANSETRON HCL 4 MG/2ML IJ SOLN: 4.0000 mg | INTRAMUSCULAR | Status: DC | PRN

## 2012-05-20 MED ORDER — FLEET ENEMA 7-19 GM/118ML RE ENEM: 1.0000 | ENEMA | Freq: Once | RECTAL | Status: AC | PRN

## 2012-05-20 MED ORDER — HYDROCODONE-ACETAMINOPHEN 5-325 MG PO TABS
1.0000 | ORAL_TABLET | ORAL | Status: DC | PRN
  Administered 2012-05-21 – 2012-05-23 (×5): 1 via ORAL
  Filled 2012-05-20 (×5): qty 1

## 2012-05-20 MED ORDER — SUCCINYLCHOLINE CHLORIDE 20 MG/ML IJ SOLN
INTRAMUSCULAR | Status: DC | PRN
  Administered 2012-05-20: 50 mg via INTRAVENOUS

## 2012-05-20 MED ORDER — METOPROLOL SUCCINATE ER 25 MG PO TB24
25.0000 mg | ORAL_TABLET | Freq: Every day | ORAL | Status: DC
  Administered 2012-05-21 – 2012-05-23 (×3): 25 mg via ORAL
  Filled 2012-05-20 (×3): qty 1

## 2012-05-20 MED ORDER — PREDNISONE 5 MG PO TABS
5.0000 mg | ORAL_TABLET | Freq: Every day | ORAL | Status: AC
  Administered 2012-05-21: 5 mg via ORAL
  Filled 2012-05-20: qty 1

## 2012-05-20 MED ORDER — CEFAZOLIN SODIUM-DEXTROSE 2-3 GM-% IV SOLR
2.0000 g | Freq: Three times a day (TID) | INTRAVENOUS | Status: DC
  Administered 2012-05-20: 2 g via INTRAVENOUS
  Filled 2012-05-20 (×5): qty 50

## 2012-05-20 MED ORDER — ACETAMINOPHEN 10 MG/ML IV SOLN: 1000.0000 mg | Freq: Once | INTRAVENOUS | Status: DC | PRN

## 2012-05-20 MED ORDER — EPHEDRINE SULFATE 50 MG/ML IJ SOLN
INTRAMUSCULAR | Status: DC | PRN
  Administered 2012-05-20: 5 mg via INTRAVENOUS

## 2012-05-20 MED ORDER — GLYCOPYRROLATE 0.2 MG/ML IJ SOLN
INTRAMUSCULAR | Status: DC | PRN
  Administered 2012-05-20: .4 mg via INTRAVENOUS

## 2012-05-20 MED ORDER — ACETAMINOPHEN 650 MG RE SUPP: 650.0000 mg | RECTAL | Status: DC | PRN

## 2012-05-20 MED ORDER — HYDROMORPHONE HCL PF 1 MG/ML IJ SOLN
INTRAMUSCULAR | Status: DC | PRN
  Administered 2012-05-20 (×2): .4 mg via INTRAVENOUS

## 2012-05-20 MED ORDER — DEXAMETHASONE SODIUM PHOSPHATE 10 MG/ML IJ SOLN
INTRAMUSCULAR | Status: DC | PRN
  Administered 2012-05-20: 5 mg via INTRAVENOUS

## 2012-05-20 MED ORDER — THROMBIN 5000 UNITS EX SOLR
CUTANEOUS | Status: DC | PRN
  Administered 2012-05-20: 10000 [IU] via TOPICAL

## 2012-05-20 MED ORDER — PROPOFOL 10 MG/ML IV BOLUS
INTRAVENOUS | Status: DC | PRN
  Administered 2012-05-20: 100 mg via INTRAVENOUS

## 2012-05-20 MED ORDER — LIDOCAINE HCL 1 % IJ SOLN: INTRAMUSCULAR | Status: DC | PRN

## 2012-05-20 MED ORDER — FENTANYL CITRATE 0.05 MG/ML IJ SOLN
INTRAMUSCULAR | Status: DC | PRN
  Administered 2012-05-20: 100 ug via INTRAVENOUS
  Administered 2012-05-20 (×2): 50 ug via INTRAVENOUS

## 2012-05-20 MED ORDER — ACETAMINOPHEN 325 MG PO TABS: 650.0000 mg | ORAL_TABLET | ORAL | Status: DC | PRN

## 2012-05-20 MED ORDER — METHOCARBAMOL 500 MG PO TABS: 500.0000 mg | ORAL_TABLET | Freq: Four times a day (QID) | ORAL | Status: DC | PRN

## 2012-05-20 MED ORDER — BISACODYL 10 MG RE SUPP: 10.0000 mg | Freq: Every day | RECTAL | Status: DC | PRN

## 2012-05-20 MED ORDER — PROMETHAZINE HCL 25 MG/ML IJ SOLN: 6.2500 mg | INTRAMUSCULAR | Status: DC | PRN

## 2012-05-20 MED ORDER — POLYETHYLENE GLYCOL 3350 17 G PO PACK: 17.0000 g | PACK | Freq: Every day | ORAL | Status: DC | PRN

## 2012-05-20 MED ORDER — BACITRACIN-NEOMYCIN-POLYMYXIN 400-5-5000 EX OINT
TOPICAL_OINTMENT | CUTANEOUS | Status: DC | PRN
  Administered 2012-05-20: 1 via TOPICAL

## 2012-05-20 MED ORDER — PHENOL 1.4 % MT LIQD: 1.0000 | OROMUCOSAL | Status: DC | PRN

## 2012-05-20 MED ORDER — BUPIVACAINE-EPINEPHRINE 0.25% -1:200000 IJ SOLN
INTRAMUSCULAR | Status: DC | PRN
  Administered 2012-05-20: 30 mL

## 2012-05-20 MED ORDER — MEPERIDINE HCL 50 MG/ML IJ SOLN: 6.2500 mg | INTRAMUSCULAR | Status: DC | PRN

## 2012-05-20 MED ORDER — HYDROMORPHONE HCL PF 1 MG/ML IJ SOLN
0.5000 mg | INTRAMUSCULAR | Status: DC | PRN
  Administered 2012-05-20: 1 mg via INTRAVENOUS
  Filled 2012-05-20: qty 1

## 2012-05-20 MED ORDER — HYDROMORPHONE HCL PF 1 MG/ML IJ SOLN: 0.2500 mg | INTRAMUSCULAR | Status: DC | PRN

## 2012-05-20 MED ORDER — METHOCARBAMOL 100 MG/ML IJ SOLN
500.0000 mg | Freq: Four times a day (QID) | INTRAVENOUS | Status: DC | PRN
  Filled 2012-05-20: qty 5

## 2012-05-20 MED ORDER — OXYCODONE HCL 5 MG PO TABS: 5.0000 mg | ORAL_TABLET | Freq: Once | ORAL | Status: DC | PRN

## 2012-05-20 MED ORDER — ONDANSETRON HCL 4 MG/2ML IJ SOLN
INTRAMUSCULAR | Status: DC | PRN
  Administered 2012-05-20: 4 mg via INTRAVENOUS

## 2012-05-20 MED ORDER — MENTHOL 3 MG MT LOZG: 1.0000 | LOZENGE | OROMUCOSAL | Status: DC | PRN

## 2012-05-20 MED ORDER — PREDNISONE 5 MG PO TABS
5.0000 mg | ORAL_TABLET | Freq: Two times a day (BID) | ORAL | Status: AC
  Administered 2012-05-20: 5 mg via ORAL
  Filled 2012-05-20 (×2): qty 1

## 2012-05-20 MED ORDER — OXYCODONE HCL 5 MG/5ML PO SOLN
5.0000 mg | Freq: Once | ORAL | Status: DC | PRN
  Filled 2012-05-20: qty 5

## 2012-05-20 MED ORDER — CISATRACURIUM BESYLATE (PF) 10 MG/5ML IV SOLN
INTRAVENOUS | Status: DC | PRN
  Administered 2012-05-20: 4 mg via INTRAVENOUS
  Administered 2012-05-20: 2 mg via INTRAVENOUS

## 2012-05-20 MED ORDER — CEFAZOLIN SODIUM 1-5 GM-% IV SOLN
1.0000 g | Freq: Three times a day (TID) | INTRAVENOUS | Status: AC
  Administered 2012-05-20 – 2012-05-21 (×3): 1 g via INTRAVENOUS
  Filled 2012-05-20 (×3): qty 50

## 2012-05-20 MED ORDER — NEOSTIGMINE METHYLSULFATE 1 MG/ML IJ SOLN
INTRAMUSCULAR | Status: DC | PRN
  Administered 2012-05-20: 3 mg via INTRAVENOUS

## 2012-05-20 MED ORDER — LIDOCAINE HCL (CARDIAC) 20 MG/ML IV SOLN
INTRAVENOUS | Status: DC | PRN
  Administered 2012-05-20: 50 mg via INTRAVENOUS

## 2012-05-20 SURGICAL SUPPLY — 47 items
BAG ZIPLOCK 12X15 (MISCELLANEOUS) IMPLANT
BENZOIN TINCTURE PRP APPL 2/3 (GAUZE/BANDAGES/DRESSINGS) ×2 IMPLANT
CLEANER TIP ELECTROSURG 2X2 (MISCELLANEOUS) ×2 IMPLANT
CLOTH BEACON ORANGE TIMEOUT ST (SAFETY) ×2 IMPLANT
CONT SPECI 4OZ STER CLIK (MISCELLANEOUS) ×2 IMPLANT
DRAIN PENROSE 18X1/4 LTX STRL (WOUND CARE) IMPLANT
DRAPE MICROSCOPE LEICA (MISCELLANEOUS) ×2 IMPLANT
DRAPE POUCH INSTRU U-SHP 10X18 (DRAPES) ×2 IMPLANT
DRAPE SURG 17X11 SM STRL (DRAPES) ×2 IMPLANT
DRSG ADAPTIC 3X8 NADH LF (GAUZE/BANDAGES/DRESSINGS) ×2 IMPLANT
DRSG PAD ABDOMINAL 8X10 ST (GAUZE/BANDAGES/DRESSINGS) ×10 IMPLANT
DURAPREP 26ML APPLICATOR (WOUND CARE) ×2 IMPLANT
ELECT BLADE 6.5 EXT (BLADE) ×2 IMPLANT
ELECT REM PT RETURN 9FT ADLT (ELECTROSURGICAL) ×2
ELECTRODE REM PT RTRN 9FT ADLT (ELECTROSURGICAL) ×1 IMPLANT
FLOSEAL 10ML (HEMOSTASIS) ×2 IMPLANT
GLOVE BIOGEL PI IND STRL 7.0 (GLOVE) ×1 IMPLANT
GLOVE BIOGEL PI IND STRL 8 (GLOVE) ×1 IMPLANT
GLOVE BIOGEL PI INDICATOR 7.0 (GLOVE) ×1
GLOVE BIOGEL PI INDICATOR 8 (GLOVE) ×1
GLOVE ECLIPSE 8.0 STRL XLNG CF (GLOVE) ×4 IMPLANT
GLOVE SURG SS PI 6.5 STRL IVOR (GLOVE) ×2 IMPLANT
GOWN PREVENTION PLUS LG XLONG (DISPOSABLE) ×4 IMPLANT
GOWN STRL REIN XL XLG (GOWN DISPOSABLE) ×2 IMPLANT
KIT BASIN OR (CUSTOM PROCEDURE TRAY) ×2 IMPLANT
KIT POSITIONING SURG ANDREWS (MISCELLANEOUS) ×2 IMPLANT
MANIFOLD NEPTUNE II (INSTRUMENTS) ×2 IMPLANT
NEEDLE SPNL 18GX3.5 QUINCKE PK (NEEDLE) ×4 IMPLANT
NS IRRIG 1000ML POUR BTL (IV SOLUTION) ×2 IMPLANT
PATTIES SURGICAL .5 X.5 (GAUZE/BANDAGES/DRESSINGS) IMPLANT
PATTIES SURGICAL .75X.75 (GAUZE/BANDAGES/DRESSINGS) IMPLANT
PATTIES SURGICAL 1X1 (DISPOSABLE) IMPLANT
PIN SAFETY NICK PLATE  2 MED (MISCELLANEOUS)
PIN SAFETY NICK PLATE 2 MED (MISCELLANEOUS) IMPLANT
POSITIONER SURGICAL ARM (MISCELLANEOUS) ×2 IMPLANT
SPONGE GAUZE 4X4 12PLY (GAUZE/BANDAGES/DRESSINGS) ×2 IMPLANT
SPONGE LAP 4X18 X RAY DECT (DISPOSABLE) ×4 IMPLANT
SPONGE SURGIFOAM ABS GEL 100 (HEMOSTASIS) ×2 IMPLANT
STAPLER VISISTAT 35W (STAPLE) ×2 IMPLANT
SUT VIC AB 0 CT1 27 (SUTURE) ×1
SUT VIC AB 0 CT1 27XBRD ANTBC (SUTURE) ×1 IMPLANT
SUT VIC AB 1 CT1 27 (SUTURE) ×4
SUT VIC AB 1 CT1 27XBRD ANTBC (SUTURE) ×4 IMPLANT
TAPE CLOTH SURG 6X10 WHT LF (GAUZE/BANDAGES/DRESSINGS) ×4 IMPLANT
TOWEL OR 17X26 10 PK STRL BLUE (TOWEL DISPOSABLE) ×4 IMPLANT
TRAY LAMINECTOMY (CUSTOM PROCEDURE TRAY) ×2 IMPLANT
WATER STERILE IRR 1500ML POUR (IV SOLUTION) IMPLANT

## 2012-05-20 NOTE — Progress Notes (Signed)
[facility] PROGRESS NOTE  [name] UJW:[id number] DOB: 02-Dec-1940 DOA: 05/19/2012 PCP: Michele Mcalpine, MD  Assessment/Plan: Severe lumbar stenosis  -Management per primary team in terms of pain control and surgery. Plan on surgery likely today.  -Patient is on aspirin and Plavix for peripheral vascular disease; will discontinue and to be resume after surgery when appropriate by ortho recommendations. -Patient is undergoing intermediate risk surgery and as per revised Goldman cardiac risk index she does not have any risk of ischemic heart disease, history of heart failure, history of CVA and has normal renal function. Based on this her intraoperative and postoperative cardiac complications are very low (0.4%). She does not need any further preoperative cardiac workup at this time.  -Continue low metoprolol for now  -continue supportive care  Parkinson's disease  -Continue with stalevo   Peripheral vascular disease.  -Will hold her aspirin and Plavix for now -resume when appropriate as per ortho service recommendations.  Hypothyroidism  -Continue Synthroid and adjust if needed. -Recent TSH done in May 2013 WNL.  Hypertension  -Stable will continue lisinopril and now metoprolol for perioperative benefits (close monitoring of HR and BP)  Osteoporosis  -continue cholecalciferol   Hx of glaucoma  -Continue cyclosporin drops   DVT prophylaxis and GI prophylaxis: SCD and Protonix  Code Status: Full Family Communication: no family at bedside Disposition Plan: plan is for surgery today according to orthopedic service; PT/OT needs to evaluate her after procedure to determine best discharge plans. Will follow along for the next day or two to guarantee stability and help with any needs.  Consultants:  Ortho is primary  [facility] consulted for medical clearance and assistance with medical cormobidities.  Antibiotics:  none  HPI/Subjective: Afebrile, mild pain on her lower  back, otherwise no acute or overnight complaints.  Objective: Filed Vitals:   05/19/12 2015 05/19/12 2217 05/20/12 0500 05/20/12 0800  BP: 109/71 125/69 152/62   Pulse: 60 66 62   Temp: 98.4 F (36.9 C) 98.3 F (36.8 C) 98.5 F (36.9 C)   TempSrc: Oral Oral Oral   Resp: 16 16 16 16   Height: 5' (1.524 m)     Weight: 55.611 kg (122 lb 9.6 oz)     SpO2: 96% 98% 99% 99%    Intake/Output Summary (Last 24 hours) at 05/20/12 1005 Last data filed at 05/20/12 0505  Gross per 24 hour  Intake    240 ml  Output    575 ml  Net   -335 ml   Filed Weights   05/19/12 2015  Weight: 55.611 kg (122 lb 9.6 oz)    Exam:   General:  Afebrile, NAD; asking to see her husband right away  Cardiovascular: S1 and S2, no rubs or gallops  Respiratory: CTA bilaterally  Abdomen: soft, NT, ND, positive BS  Extremities: no edema or cyanosis; decrease pulses bilaterally  Neuro: decrease MS bilaterally LE's (3/5), present reflexes, able to follow commands, CN intact  Data Reviewed: Basic Metabolic Panel:  Lab 05/19/12 5621 05/19/12 1300  NA 139 137  K 4.1 4.2  CL 107 101  CO2 25 26  GLUCOSE 167* 127*  BUN 17 22  CREATININE 0.50 0.72  CALCIUM 9.0 9.5  MG -- --  PHOS -- --   Liver Function Tests:  Lab 05/19/12 2000  AST 16  ALT <5  ALKPHOS 46  BILITOT 0.6  PROT 5.7*  ALBUMIN 3.2*   CBC:  Lab 05/19/12 2000 05/19/12 1300  WBC 6.8 8.6  NEUTROABS 5.6  7.7  HGB 13.3 14.1  HCT 39.6 41.0  MCV 92.7 92.1  PLT 220 224    Studies: Mr Lumbar Spine W Wo Contrast  05/19/2012  *RADIOLOGY REPORT*  Clinical Data: Progressively worsening lower extremity weakness. Inability to ambulate or weightbear.  History of breast cancer. Lumbar spine operation 2007.  MRI LUMBAR SPINE WITHOUT AND WITH CONTRAST  Technique:  Multiplanar and multiecho pulse sequences of the lumbar spine were obtained without and with intravenous contrast.  Contrast: 11mL MULTIHANCE GADOBENATE DIMEGLUMINE 529 MG/ML IV SOLN   Comparison: None.  Findings: Numbering convention used on prior exam preserved. Severe multilevel lumbar spondylosis.  Spinal cord terminates posterior to L2.  Vertebral body height preserved.  There is a dextroconvex lumbar scoliosis with the apex at L3.  Bilateral renal sinus cysts are present. Bone marrow signal shows suppression of fatty marrow, which is a nonspecific finding, commonly associated with anemia, chronic disease, cigarette smoking, and obesity.  Lower thoracic levels show an adequate patency of the central canal.  Disc desiccation is present throughout.  L1-L2:  Disc desiccation.  No stenosis.  L2-L3:  There is severe multifactorial central stenosis.  This is better seen on the sagittal images than on axial images.  Bunching of the nerve roots is present proximally.  L2-L3 disc degeneration with broad-based posterior bulging and left paracentral extrusion with both cranial and caudal migration of disc material.  Posterior ligamentum flavum redundancy contributes to the central stenosis. On sagittal imaging, there appears to be compression of the cauda equina.  Mild bilateral foraminal stenosis associated with bulging disc.  Left lateral recess stenosis associated with ligamentum flavum redundancy and extrusion.  L3-L4:  Severe disc desiccation.  Wide posterior decompression. Grade 1 anterolisthesis of L3 on L4 measuring 3 mm.  Vacuum disc. Moderate left foraminal stenosis.  Right foramen patent. Laminectomy.  L4-L5:  Grade 1 anterolisthesis measuring 6 mm.  Laminectomy with wide posterior decompression.  No recurrent central or lateral recess stenosis.  Severe left foraminal stenosis is multifactorial. Right foramen appears patent.  Bilateral facet arthrosis accounts for anterolisthesis.  High T2 signal is present within the disc space, compatible with vacuum disc rather than fluid.  L5-S1:  Disc desiccation.  Small vacuum disc.  Shallow broad-based disc bulging.  Central canal and lateral recesses  are patent. Moderate right and mild left foraminal stenosis is multifactorial. Right greater than left facet arthrosis.  After Gadolinium administration, there is enhancement of the extruded L2-L3 disc periphery, with central nonenhancement.  A large enhancing fibroid is present in the anatomic pelvis.  IMPRESSION:  1.  Severe L2-L3 central stenosis associated with left paracentral disc extrusion and posterior ligamentum flavum redundancy and facet hypertrophy. Left lateral recess stenosis. Mild bilateral foraminal stenosis. 2.  Postoperative changes with decompression of the central canal L3-L4 and L4-L5.  Residual/recurrent foraminal stenosis as described above, potentially affecting the right L3 and L4 nerves. 3.  L5-S1 facet arthrosis with right greater than left foraminal stenosis.   Original Report Authenticated By: Andreas Newport, M.D.    Dg Chest Portable 1 View  05/19/2012  *RADIOLOGY REPORT*  Clinical Data: Shortness of breath and weakness.  PORTABLE CHEST - 1 VIEW  Comparison: 11/08/2010.  Findings: 1855 hours.  There is chronic elevation of the right hemidiaphragm with associated chronic right basilar atelectasis. Overall appearance is similar.  No superimposed airspace disease, edema or significant pleural effusion is seen.  Heart size and mediastinal contours are stable.  IMPRESSION: Stable chronic elevation of the right hemidiaphragm  and right basilar atelectasis.  No acute cardiopulmonary process identified.   Original Report Authenticated By: Carey Bullocks, M.D.     Scheduled Meds:   . carbidopa-levodopa  1.5 tablet Oral QID   And  . entacapone  200 mg Oral QID  . cycloSPORINE  1 drop Both Eyes BID  . levothyroxine  75 mcg Oral QAC breakfast  . lisinopril  10 mg Oral Daily  . [COMPLETED] LORazepam  1 mg Intravenous Once  . metoprolol succinate  50 mg Oral Daily  . pantoprazole  40 mg Oral Daily  . pneumococcal 23 valent vaccine  0.5 mL Intramuscular Tomorrow-1000  . predniSONE  5  mg Oral BID WC   Followed by  . predniSONE  5 mg Oral Q breakfast  . [DISCONTINUED] predniSONE  5 mg Oral UD   Continuous Infusions:   . lactated ringers 75 mL/hr at 05/19/12 2316  . [DISCONTINUED] sodium chloride 125 mL/hr at 05/19/12 1258    Time spent: >30 minutes    Saniah Schroeter  [facility] Pager [phone]. If 8PM-8AM, please contact night-coverage at [email or web address], password [facility] 05/20/2012, 10:05 AM  LOS: 1 day

## 2012-05-20 NOTE — Transfer of Care (Signed)
Immediate Anesthesia Transfer of Care Note  Patient: [name]  Procedure(s) Performed: Procedure(s) (LRB) with comments: LUMBAR LAMINECTOMY/DECOMPRESSION MICRODISCECTOMY 2 LEVELS (N/A)  Patient Location: PACU  Anesthesia Type:General  Level of Consciousness: awake, alert  and oriented  Airway & Oxygen Therapy: Patient Spontanous Breathing and Patient connected to face mask oxygen  Post-op Assessment: Report given to PACU RN and Post -op Vital signs reviewed and stable  Post vital signs: Reviewed and stable  Complications: No apparent anesthesia complications

## 2012-05-20 NOTE — H&P (View-Only) (Signed)
[facility] PROGRESS NOTE  [name] MRN:[id number] DOB: 11/22/1940 DOA: 05/19/2012 PCP: NADEL,SCOTT M, MD  Assessment/Plan: Severe lumbar stenosis  -Management per primary team in terms of pain control and surgery. Plan on surgery likely today.  -Patient is on aspirin and Plavix for peripheral vascular disease; will discontinue and to be resume after surgery when appropriate by ortho recommendations. -Patient is undergoing intermediate risk surgery and as per revised Goldman cardiac risk index she does not have any risk of ischemic heart disease, history of heart failure, history of CVA and has normal renal function. Based on this her intraoperative and postoperative cardiac complications are very low (0.4%). She does not need any further preoperative cardiac workup at this time.  -Continue low metoprolol for now  -continue supportive care  Parkinson's disease  -Continue with stalevo   Peripheral vascular disease.  -Will hold her aspirin and Plavix for now -resume when appropriate as per ortho service recommendations.  Hypothyroidism  -Continue Synthroid and adjust if needed. -Recent TSH done in May 2013 WNL.  Hypertension  -Stable will continue lisinopril and now metoprolol for perioperative benefits (close monitoring of HR and BP)  Osteoporosis  -continue cholecalciferol   Hx of glaucoma  -Continue cyclosporin drops   DVT prophylaxis and GI prophylaxis: SCD and Protonix  Code Status: Full Family Communication: no family at bedside Disposition Plan: plan is for surgery today according to orthopedic service; PT/OT needs to evaluate her after procedure to determine best discharge plans. Will follow along for the next day or two to guarantee stability and help with any needs.  Consultants:  Ortho is primary  [facility] consulted for medical clearance and assistance with medical cormobidities.  Antibiotics:  none  HPI/Subjective: Afebrile, mild pain on her lower  back, otherwise no acute or overnight complaints.  Objective: Filed Vitals:   05/19/12 2015 05/19/12 2217 05/20/12 0500 05/20/12 0800  BP: 109/71 125/69 152/62   Pulse: 60 66 62   Temp: 98.4 F (36.9 C) 98.3 F (36.8 C) 98.5 F (36.9 C)   TempSrc: Oral Oral Oral   Resp: 16 16 16 16  Height: 5' (1.524 m)     Weight: 55.611 kg (122 lb 9.6 oz)     SpO2: 96% 98% 99% 99%    Intake/Output Summary (Last 24 hours) at 05/20/12 1005 Last data filed at 05/20/12 0505  Gross per 24 hour  Intake    240 ml  Output    575 ml  Net   -335 ml   Filed Weights   05/19/12 2015  Weight: 55.611 kg (122 lb 9.6 oz)    Exam:   General:  Afebrile, NAD; asking to see her husband right away  Cardiovascular: S1 and S2, no rubs or gallops  Respiratory: CTA bilaterally  Abdomen: soft, NT, ND, positive BS  Extremities: no edema or cyanosis; decrease pulses bilaterally  Neuro: decrease MS bilaterally LE's (3/5), present reflexes, able to follow commands, CN intact  Data Reviewed: Basic Metabolic Panel:  Lab 05/19/12 2000 05/19/12 1300  NA 139 137  K 4.1 4.2  CL 107 101  CO2 25 26  GLUCOSE 167* 127*  BUN 17 22  CREATININE 0.50 0.72  CALCIUM 9.0 9.5  MG -- --  PHOS -- --   Liver Function Tests:  Lab 05/19/12 2000  AST 16  ALT <5  ALKPHOS 46  BILITOT 0.6  PROT 5.7*  ALBUMIN 3.2*   CBC:  Lab 05/19/12 2000 05/19/12 1300  WBC 6.8 8.6  NEUTROABS 5.6   7.7  HGB 13.3 14.1  HCT 39.6 41.0  MCV 92.7 92.1  PLT 220 224    Studies: Mr Lumbar Spine W Wo Contrast  05/19/2012  *RADIOLOGY REPORT*  Clinical Data: Progressively worsening lower extremity weakness. Inability to ambulate or weightbear.  History of breast cancer. Lumbar spine operation 2007.  MRI LUMBAR SPINE WITHOUT AND WITH CONTRAST  Technique:  Multiplanar and multiecho pulse sequences of the lumbar spine were obtained without and with intravenous contrast.  Contrast: 11mL MULTIHANCE GADOBENATE DIMEGLUMINE 529 MG/ML IV SOLN   Comparison: None.  Findings: Numbering convention used on prior exam preserved. Severe multilevel lumbar spondylosis.  Spinal cord terminates posterior to L2.  Vertebral body height preserved.  There is a dextroconvex lumbar scoliosis with the apex at L3.  Bilateral renal sinus cysts are present. Bone marrow signal shows suppression of fatty marrow, which is a nonspecific finding, commonly associated with anemia, chronic disease, cigarette smoking, and obesity.  Lower thoracic levels show an adequate patency of the central canal.  Disc desiccation is present throughout.  L1-L2:  Disc desiccation.  No stenosis.  L2-L3:  There is severe multifactorial central stenosis.  This is better seen on the sagittal images than on axial images.  Bunching of the nerve roots is present proximally.  L2-L3 disc degeneration with broad-based posterior bulging and left paracentral extrusion with both cranial and caudal migration of disc material.  Posterior ligamentum flavum redundancy contributes to the central stenosis. On sagittal imaging, there appears to be compression of the cauda equina.  Mild bilateral foraminal stenosis associated with bulging disc.  Left lateral recess stenosis associated with ligamentum flavum redundancy and extrusion.  L3-L4:  Severe disc desiccation.  Wide posterior decompression. Grade 1 anterolisthesis of L3 on L4 measuring 3 mm.  Vacuum disc. Moderate left foraminal stenosis.  Right foramen patent. Laminectomy.  L4-L5:  Grade 1 anterolisthesis measuring 6 mm.  Laminectomy with wide posterior decompression.  No recurrent central or lateral recess stenosis.  Severe left foraminal stenosis is multifactorial. Right foramen appears patent.  Bilateral facet arthrosis accounts for anterolisthesis.  High T2 signal is present within the disc space, compatible with vacuum disc rather than fluid.  L5-S1:  Disc desiccation.  Small vacuum disc.  Shallow broad-based disc bulging.  Central canal and lateral recesses  are patent. Moderate right and mild left foraminal stenosis is multifactorial. Right greater than left facet arthrosis.  After Gadolinium administration, there is enhancement of the extruded L2-L3 disc periphery, with central nonenhancement.  A large enhancing fibroid is present in the anatomic pelvis.  IMPRESSION:  1.  Severe L2-L3 central stenosis associated with left paracentral disc extrusion and posterior ligamentum flavum redundancy and facet hypertrophy. Left lateral recess stenosis. Mild bilateral foraminal stenosis. 2.  Postoperative changes with decompression of the central canal L3-L4 and L4-L5.  Residual/recurrent foraminal stenosis as described above, potentially affecting the right L3 and L4 nerves. 3.  L5-S1 facet arthrosis with right greater than left foraminal stenosis.   Original Report Authenticated By: Geoffrey Lamke, M.D.    Dg Chest Portable 1 View  05/19/2012  *RADIOLOGY REPORT*  Clinical Data: Shortness of breath and weakness.  PORTABLE CHEST - 1 VIEW  Comparison: 11/08/2010.  Findings: 1855 hours.  There is chronic elevation of the right hemidiaphragm with associated chronic right basilar atelectasis. Overall appearance is similar.  No superimposed airspace disease, edema or significant pleural effusion is seen.  Heart size and mediastinal contours are stable.  IMPRESSION: Stable chronic elevation of the right hemidiaphragm   and right basilar atelectasis.  No acute cardiopulmonary process identified.   Original Report Authenticated By: William Veazey, M.D.     Scheduled Meds:   . carbidopa-levodopa  1.5 tablet Oral QID   And  . entacapone  200 mg Oral QID  . cycloSPORINE  1 drop Both Eyes BID  . levothyroxine  75 mcg Oral QAC breakfast  . lisinopril  10 mg Oral Daily  . [COMPLETED] LORazepam  1 mg Intravenous Once  . metoprolol succinate  50 mg Oral Daily  . pantoprazole  40 mg Oral Daily  . pneumococcal 23 valent vaccine  0.5 mL Intramuscular Tomorrow-1000  . predniSONE  5  mg Oral BID WC   Followed by  . predniSONE  5 mg Oral Q breakfast  . [DISCONTINUED] predniSONE  5 mg Oral UD   Continuous Infusions:   . lactated ringers 75 mL/hr at 05/19/12 2316  . [DISCONTINUED] sodium chloride 125 mL/hr at 05/19/12 1258    Time spent: >30 minutes    Lynnett Langlinais  [facility] Pager [phone]. If 8PM-8AM, please contact night-coverage at [email or web address], password [email or web address] 05/20/2012, 10:05 AM  LOS: 1 day              

## 2012-05-20 NOTE — Interval H&P Note (Signed)
History and Physical Interval Note:  05/20/2012 3:23 PM  [name]  has presented today for surgery, with the diagnosis of Spinal stenosis lumbar 2 to lumbar3  The various methods of treatment have been discussed with the patient and family. After consideration of risks, benefits and other options for treatment, the patient has consented to  Procedure(s) (LRB) with comments: LUMBAR LAMINECTOMY/DECOMPRESSION MICRODISCECTOMY 2 LEVELS (N/A) as a surgical intervention .  The patient's history has been reviewed, patient examined, no change in status, stable for surgery.  I have reviewed the patient's chart and labs.  Questions were answered to the patient's satisfaction.     Grover Robinson A

## 2012-05-20 NOTE — Anesthesia Procedure Notes (Signed)
Procedure Name: Intubation Date/Time: 05/20/2012 4:20 PM Performed by: Leroy Libman L Patient Re-evaluated:Patient Re-evaluated prior to inductionOxygen Delivery Method: Circle system utilized Preoxygenation: Pre-oxygenation with 100% oxygen Intubation Type: IV induction Ventilation: Mask ventilation without difficulty and Oral airway inserted - appropriate to patient size Laryngoscope Size: Hyacinth Meeker and 2 Grade View: Grade I Tube type: Oral Tube size: 7.0 mm Number of attempts: 1 Airway Equipment and Method: Stylet Placement Confirmation: ETT inserted through vocal cords under direct vision,  breath sounds checked- equal and bilateral and positive ETCO2 Secured at: 21 cm Tube secured with: Tape Dental Injury: Teeth and Oropharynx as per pre-operative assessment

## 2012-05-20 NOTE — Anesthesia Preprocedure Evaluation (Addendum)
Anesthesia Evaluation  Patient identified by MRN, date of birth, ID band Patient awake    Reviewed: Allergy & Precautions, H&P , NPO status , Patient's Chart, lab work & pertinent test results  Airway       Dental  (+) Dental Advisory Given   Pulmonary neg pulmonary ROS,          Cardiovascular hypertension, Pt. on medications + Peripheral Vascular Disease     Neuro/Psych PSYCHIATRIC DISORDERS Anxiety negative neurological ROS     GI/Hepatic negative GI ROS, Neg liver ROS,   Endo/Other  Hypothyroidism   Renal/GU negative Renal ROS     Musculoskeletal negative musculoskeletal ROS (+)   Abdominal   Peds  Hematology negative hematology ROS (+)   Anesthesia Other Findings   Reproductive/Obstetrics                        Anesthesia Physical Anesthesia Plan  ASA: III  Anesthesia Plan: General   Post-op Pain Management:    Induction: Intravenous  Airway Management Planned: Oral ETT  Additional Equipment:   Intra-op Plan:   Post-operative Plan: Extubation in OR  Informed Consent: I have reviewed the patients History and Physical, chart, labs and discussed the procedure including the risks, benefits and alternatives for the proposed anesthesia with the patient or authorized representative who has indicated his/her understanding and acceptance.   Dental advisory given  Plan Discussed with: CRNA  Anesthesia Plan Comments:        Anesthesia Quick Evaluation

## 2012-05-20 NOTE — Preoperative (Signed)
Beta Blockers   Reason not to administer Beta Blockers:Not Applicable 

## 2012-05-20 NOTE — Brief Op Note (Signed)
05/19/2012 - 05/20/2012  6:17 PM  PATIENT:  [name]  71 y.o. female  PRE-OPERATIVE DIAGNOSIS:  Spinal stenosis lumbar 2 to lumbar3  POST-OPERATIVE DIAGNOSIS:  spinal stenosis lumbar 2- lumbar 3  PROCEDURE:  Procedure(s) (LRB) with comments: LUMBAR LAMINECTOMY/DECOMPRESSION MICRODISCECTOMY 2 LEVELS (N/A) and decompression of TWO Nerve Roots Bilaterally.  SURGEON:  Surgeon(s) and Role:    * Jacki Cones, MD - Primary    * Javier Docker, MD - Assisting    ASSISTANTS: Jene Every MD   ANESTHESIA:   general  EBL:  Total I/O In: 2105 [I.V.:2105] Out: 875 [Urine:750; Blood:125]  BLOOD ADMINISTERED:none  DRAINS: none   LOCAL MEDICATIONS USED:  MARCAINE   30cc of 0.25%  SPECIMEN:  No Specimen  DISPOSITION OF SPECIMEN:  N/A  COUNTS:  YES  TOURNIQUET:  * No tourniquets in log *  DICTATION: .Other Dictation: Dictation Number [id number]  PLAN OF CARE: Admit to inpatient   PATIENT DISPOSITION:  PACU - hemodynamically stable.   Delay start of Pharmacological VTE agent (>24hrs) due to surgical blood loss or risk of bleeding: yes

## 2012-05-20 NOTE — Progress Notes (Signed)
Subjective:Herniated Lumbar Disc     Patient reports pain as 1 on 0-10 scale.    Objective: Vital signs in last 24 hours: Temp:  [98.2 F (36.8 C)-98.5 F (36.9 C)] 98.5 F (36.9 C) (11/26 0500) Pulse Rate:  [54-81] 62  (11/26 0500) Resp:  [14-18] 16  (11/26 0500) BP: (90-152)/(40-71) 152/62 mmHg (11/26 0500) SpO2:  [96 %-99 %] 99 % (11/26 0500) Weight:  [55.611 kg (122 lb 9.6 oz)] 55.611 kg (122 lb 9.6 oz) (11/25 2015)  Intake/Output from previous day: 11/25 0701 - 11/26 0700 In: 240 [P.O.:240] Out: 575 [Urine:575] Intake/Output this shift:     Basename 05/19/12 2000 05/19/12 1300  HGB 13.3 14.1    Basename 05/19/12 2000 05/19/12 1300  WBC 6.8 8.6  RBC 4.27 4.45  HCT 39.6 41.0  PLT 220 224    Basename 05/19/12 2000 05/19/12 1300  NA 139 137  K 4.1 4.2  CL 107 101  CO2 25 26  BUN 17 22  CREATININE 0.50 0.72  GLUCOSE 167* 127*  CALCIUM 9.0 9.5    Basename 05/19/12 2000  LABPT --  INR 1.10    Very weak in lowers when she attempts to ambulate.  Assessment/Plan:    Surgery this week. Has been on Plavix. Continue foley due to strict I&O  Omar Orrego A 05/20/2012, 7:06 AM

## 2012-05-20 NOTE — Anesthesia Postprocedure Evaluation (Signed)
Anesthesia Post Note  Patient: [name]  Procedure(s) Performed: Procedure(s) (LRB): LUMBAR LAMINECTOMY/DECOMPRESSION MICRODISCECTOMY 2 LEVELS (N/A)  Anesthesia type: General  Patient location: PACU  Post pain: Pain level controlled  Post assessment: Post-op Vital signs reviewed  Last Vitals: BP 123/84  Pulse 86  Temp 37.6 C (Oral)  Resp 14  Ht 5' (1.524 m)  Wt 122 lb 9.6 oz (55.611 kg)  BMI 23.94 kg/m2  SpO2 99%  Post vital signs: Reviewed  Level of consciousness: sedated  Complications: No apparent anesthesia complications

## 2012-05-21 ENCOUNTER — Encounter (HOSPITAL_COMMUNITY): Payer: Self-pay | Admitting: Orthopedic Surgery

## 2012-05-21 ENCOUNTER — Inpatient Hospital Stay (HOSPITAL_COMMUNITY): Payer: Medicare Other

## 2012-05-21 DIAGNOSIS — E039 Hypothyroidism, unspecified: Secondary | ICD-10-CM

## 2012-05-21 DIAGNOSIS — Z01818 Encounter for other preprocedural examination: Secondary | ICD-10-CM

## 2012-05-21 DIAGNOSIS — I10 Essential (primary) hypertension: Secondary | ICD-10-CM

## 2012-05-21 DIAGNOSIS — I739 Peripheral vascular disease, unspecified: Secondary | ICD-10-CM

## 2012-05-21 LAB — URINALYSIS, ROUTINE W REFLEX MICROSCOPIC
Glucose, UA: NEGATIVE mg/dL
Hgb urine dipstick: NEGATIVE
Ketones, ur: NEGATIVE mg/dL
Protein, ur: NEGATIVE mg/dL

## 2012-05-21 LAB — URINE MICROSCOPIC-ADD ON

## 2012-05-21 LAB — HM DEXA SCAN: HM DEXA SCAN: NORMAL

## 2012-05-21 LAB — CBC
HCT: 38.6 % (ref 36.0–46.0)
Hemoglobin: 13.1 g/dL (ref 12.0–15.0)
MCH: 31.4 pg (ref 26.0–34.0)
MCHC: 33.9 g/dL (ref 30.0–36.0)

## 2012-05-21 MED ORDER — OXYCODONE-ACETAMINOPHEN 5-325 MG PO TABS: 1.0000 | ORAL_TABLET | ORAL | Status: DC | PRN

## 2012-05-21 MED ORDER — METHOCARBAMOL 500 MG PO TABS: 500.0000 mg | ORAL_TABLET | Freq: Four times a day (QID) | ORAL | Status: DC | PRN

## 2012-05-21 MED ORDER — POLYETHYLENE GLYCOL 3350 17 G PO PACK: 17.0000 g | PACK | Freq: Every day | ORAL | Status: DC | PRN

## 2012-05-21 MED FILL — Bacitracin Zinc Oint 500 Unit/GM: CUTANEOUS | Qty: 15 | Status: AC

## 2012-05-21 NOTE — Progress Notes (Signed)
Clinical Social Work Department BRIEF PSYCHOSOCIAL ASSESSMENT 05/21/2012  Patient:  [name], [name]     Account Number:  [id number]     Admit date:  05/19/2012  Clinical Social Worker:  Candie Chroman  Date/Time:  05/21/2012 02:12 PM  Referred by:  Physician  Date Referred:  05/21/2012 Referred for  SNF Placement   Other Referral:   Interview type:  Patient Other interview type:    PSYCHOSOCIAL DATA Living Status:  HUSBAND Admitted from facility:   Level of care:   Primary support name:  L[place] Primary support relationship to patient:  SPOUSE Degree of support available:   supportive    CURRENT CONCERNS Current Concerns  Post-Acute Placement   Other Concerns:    SOCIAL WORK ASSESSMENT / PLAN Pt is a 71 yr old female living at home prior to hospitalization. CSW met with pt / spouse to assist with d/c planning. Pt will require ST Rehab following hospital d/c . Pt/spouse requesting placement at [facility]. SNF search intiated and [facility] contacted. [facility] is able to offer bed for ST Rehab. Pt / spoue / MD are aware.   Assessment/plan status:  Psychosocial Support/Ongoing Assessment of Needs Other assessment/ plan:   Information/referral to community resources:   SNF list provided.    PATIENT'S/FAMILY'S RESPONSE TO PLAN OF CARE: Pt / spouse are pleased with d/c plan. Pt is looking forward to rehab at [facility].    Cori Razor LCSW [phone]

## 2012-05-21 NOTE — Evaluation (Signed)
Occupational Therapy Evaluation Patient Details Name: [name] MRN: [id number] DOB: [birth year] Today's Date: 05/21/2012 Time: 4098-1191 OT Time Calculation (min): 20 min  OT Assessment / Plan / Recommendation Clinical Impression  This 71 year old female with a history of Parkinson's and prior back surgery was admitted for decompression of 2 nerve roots, L2-3.  She had assist for adls prior to admission due to weakness and was unable to ambulate due to legs giving out.  She will benefit from skilled OT to increase independence with adls.  Goals in acute are min A level.      OT Assessment  Patient needs continued OT Services    Follow Up Recommendations  SNF    Barriers to Discharge      Equipment Recommendations  3 in 1 bedside comode    Recommendations for Other Services    Frequency  Min 2X/week    Precautions / Restrictions Precautions Precautions: Back Restrictions Weight Bearing Restrictions: No   Pertinent Vitals/Pain No pain initially; had some back pain during bed mobility lying back down--repositioned.  Pt was premedicated    ADL  Grooming: Performed;Brushing hair Where Assessed - Grooming: Unsupported sitting Upper Body Bathing: Simulated;Set up Where Assessed - Upper Body Bathing: Unsupported sitting Lower Body Bathing: Simulated;+2 Total assistance Lower Body Bathing: Patient Percentage: 30% Where Assessed - Lower Body Bathing: Supported sit to stand Upper Body Dressing: Simulated;Minimal assistance Where Assessed - Upper Body Dressing: Unsupported sitting Lower Body Dressing: Simulated;+2 Total assistance Lower Body Dressing: Patient Percentage: 10% Where Assessed - Lower Body Dressing: Supported sit to stand Toilet Transfer: Performed;+2 Total assistance Toilet Transfer: Patient Percentage: 50% Statistician Method: Sit to Barista: Raised toilet seat with arms (or 3-in-1 over toilet) Toileting - Clothing Manipulation and  Hygiene: Performed;+2 Total assistance Toileting - Architect and Hygiene: Patient Percentage: 0% Where Assessed - Glass blower/designer Manipulation and Hygiene: Sit to stand from 3-in-1 or toilet Equipment Used: Gait belt Transfers/Ambulation Related to ADLs: ambulated to bathroom; pt needed max cues for posture:  leans backwards and on way back had difficulty extending legs:  walking back needed more assistance ADL Comments: pt with Parkinson's and strong posterior lean which she corrects with cues.  Pt needs A x 2 for LB ADLs for safety.  Recommended nursing use a 3:1 next to bed due to potential for buckling and leaning    OT Diagnosis: Generalized weakness  OT Problem List: Decreased strength;Decreased activity tolerance;Impaired balance (sitting and/or standing);Decreased cognition;Decreased knowledge of use of DME or AE;Pain;Decreased knowledge of precautions OT Treatment Interventions: Self-care/ADL training;DME and/or AE instruction;Therapeutic activities;Cognitive remediation/compensation;Patient/family education;Balance training   OT Goals Acute Rehab OT Goals OT Goal Formulation: With patient Time For Goal Achievement: 05/28/12 Potential to Achieve Goals: Good ADL Goals Pt Will Perform Lower Body Bathing: with min assist;Sit to stand from chair;with adaptive equipment;with cueing (comment type and amount) (min cues) ADL Goal: Lower Body Bathing - Progress: Goal set today Pt Will Perform Lower Body Dressing: with mod assist;Sit to stand from chair;with adaptive equipment;with cueing (comment type and amount) (min cues) ADL Goal: Lower Body Dressing - Progress: Goal set today Pt Will Transfer to Toilet: with min assist;Stand pivot transfer;3-in-1;Maintaining back safety precautions (min cues) ADL Goal: Toilet Transfer - Progress: Goal set today Miscellaneous OT Goals Miscellaneous OT Goal #1: Pt will verbalize 3 back precautions with use of handout, as needed OT Goal:  Miscellaneous Goal #1 - Progress: Goal set today Miscellaneous OT Goal #2: Pt will maintain  static standing for 2 minutes with min A, min cues for help with adls (hygiene, pulling pants over hips) OT Goal: Miscellaneous Goal #2 - Progress: Goal set today  Visit Information  Last OT Received On: 05/21/12 Assistance Needed: +2 PT/OT Co-Evaluation/Treatment: Yes    Subjective Data  Subjective: I could do everything (adls); family member states he helped Patient Stated Goal: rehab   Prior Functioning     Home Living Available Help at Discharge: Skilled Nursing Facility Additional Comments: family was helping with adls prior to admission.  Pt was unable to walk recently:  legs gave out Prior Function Level of Independence: Needs assistance Driving: No Communication Communication: No difficulties         Vision/Perception     Cognition  Overall Cognitive Status: Impaired Area of Impairment: Memory Arousal/Alertness: Awake/alert Orientation Level: Appears intact for tasks assessed Behavior During Session: [facility] for tasks performed Cognition - Other Comments: needs reinforcement with back precautions.  Decreased memory for PLOF    Extremity/Trunk Assessment Right Upper Extremity Assessment RUE ROM/Strength/Tone: WFL for tasks assessed RUE Coordination: Deficits RUE Coordination Deficits: intention tremor Left Upper Extremity Assessment LUE ROM/Strength/Tone: WFL for tasks assessed LUE Coordination:  (intention tremor) Trunk Assessment Trunk Assessment:  (posterior lean in standing)     Mobility Bed Mobility Bed Mobility: Rolling Right Rolling Right: 4: Min assist;With rail Right Sidelying to Sit: 4: Min assist;With rails;HOB flat Details for Bed Mobility Assistance: min cues for log rolling Transfers Transfers: Sit to Stand Sit to Stand: 3: Mod assist (mod cues for trunk posture)     Shoulder Instructions     Exercise     Balance     End of Session OT - End  of Session Activity Tolerance: Patient limited by fatigue Patient left: in bed;with call bell/phone within reach;with family/visitor present Nurse Communication:  (use 3:1)  GO     [name] 05/21/2012, 11:03 AM [name], OTR/L [phone] 05/21/2012

## 2012-05-21 NOTE — Progress Notes (Signed)
[facility] PROGRESS NOTE  [name] ZOX:[id number] DOB: 1940-11-16 DOA: 05/19/2012 PCP: Michele Mcalpine, MD  Assessment/Plan: Severe lumbar stenosis  -Management per primary team in terms of pain control and surgery. -Continue low metoprolol for now  -continue supportive care  Parkinson's disease  -Continue with stalevo   Peripheral vascular disease.  -aspirin and plavix on hold. -resume when appropriate as per ortho service recommendations.  Hypothyroidism  -Continue Synthroid and adjust if needed. -Recent TSH done in May 2013 WNL.  Hypertension  -Stable will continue lisinopril and now metoprolol for perioperative benefits (close monitoring of HR and BP)  Osteoporosis  -continue cholecalciferol   Hx of glaucoma  -Continue cyclosporin drops   Fever - yesterday. - possibly from atelectasis. Will obtain a cbc and CXR, UA. Continue with incentive spirometry.  DVT prophylaxis and GI prophylaxis: SCD and Protonix  Code Status: Full Family Communication: no family at bedside Disposition Plan: d/c on Friday as per primary.  Consultants:  Ortho is primary  [facility] consulted for medical clearance and assistance with medical cormobidities.  Antibiotics:  ANCEF from 11/27  HPI/Subjective: No new complaints.   Objective: Filed Vitals:   05/20/12 2227 05/20/12 2339 05/21/12 0038 05/21/12 0657  BP: 138/77 157/78 161/94 137/80  Pulse: 79 89 82 90  Temp: 99.5 F (37.5 C) 97.9 F (36.6 C) 98.5 F (36.9 C) 99.1 F (37.3 C)  TempSrc: Oral Oral Oral Oral  Resp: 16 16 14 14   Height:      Weight:      SpO2: 98% 100% 95% 96%    Intake/Output Summary (Last 24 hours) at 05/21/12 0912 Last data filed at 05/21/12 0900  Gross per 24 hour  Intake   3400 ml  Output   1575 ml  Net   1825 ml   Filed Weights   05/19/12 2015  Weight: 55.611 kg (122 lb 9.6 oz)    Exam:   General:  Afebrile, NAD; asking to see her husband right away  Cardiovascular: S1 and S2,  no rubs or gallops  Respiratory: CTA bilaterally  Abdomen: soft, NT, ND, positive BS  Extremities: no edema or cyanosis; decrease pulses bilaterally  Neuro: decrease MS bilaterally LE's (3/5), present reflexes, able to follow commands, CN intact  Data Reviewed: Basic Metabolic Panel:  Lab 05/19/12 8119 05/19/12 1300  NA 139 137  K 4.1 4.2  CL 107 101  CO2 25 26  GLUCOSE 167* 127*  BUN 17 22  CREATININE 0.50 0.72  CALCIUM 9.0 9.5  MG -- --  PHOS -- --   Liver Function Tests:  Lab 05/19/12 2000  AST 16  ALT <5  ALKPHOS 46  BILITOT 0.6  PROT 5.7*  ALBUMIN 3.2*   CBC:  Lab 05/19/12 2000 05/19/12 1300  WBC 6.8 8.6  NEUTROABS 5.6 7.7  HGB 13.3 14.1  HCT 39.6 41.0  MCV 92.7 92.1  PLT 220 224    Studies: Mr Lumbar Spine W Wo Contrast  05/19/2012  *RADIOLOGY REPORT*  Clinical Data: Progressively worsening lower extremity weakness. Inability to ambulate or weightbear.  History of breast cancer. Lumbar spine operation 2007.  MRI LUMBAR SPINE WITHOUT AND WITH CONTRAST  Technique:  Multiplanar and multiecho pulse sequences of the lumbar spine were obtained without and with intravenous contrast.  Contrast: 11mL MULTIHANCE GADOBENATE DIMEGLUMINE 529 MG/ML IV SOLN  Comparison: None.  Findings: Numbering convention used on prior exam preserved. Severe multilevel lumbar spondylosis.  Spinal cord terminates posterior to L2.  Vertebral body height preserved.  There is a dextroconvex lumbar scoliosis with the apex at L3.  Bilateral renal sinus cysts are present. Bone marrow signal shows suppression of fatty marrow, which is a nonspecific finding, commonly associated with anemia, chronic disease, cigarette smoking, and obesity.  Lower thoracic levels show an adequate patency of the central canal.  Disc desiccation is present throughout.  L1-L2:  Disc desiccation.  No stenosis.  L2-L3:  There is severe multifactorial central stenosis.  This is better seen on the sagittal images than on  axial images.  Bunching of the nerve roots is present proximally.  L2-L3 disc degeneration with broad-based posterior bulging and left paracentral extrusion with both cranial and caudal migration of disc material.  Posterior ligamentum flavum redundancy contributes to the central stenosis. On sagittal imaging, there appears to be compression of the cauda equina.  Mild bilateral foraminal stenosis associated with bulging disc.  Left lateral recess stenosis associated with ligamentum flavum redundancy and extrusion.  L3-L4:  Severe disc desiccation.  Wide posterior decompression. Grade 1 anterolisthesis of L3 on L4 measuring 3 mm.  Vacuum disc. Moderate left foraminal stenosis.  Right foramen patent. Laminectomy.  L4-L5:  Grade 1 anterolisthesis measuring 6 mm.  Laminectomy with wide posterior decompression.  No recurrent central or lateral recess stenosis.  Severe left foraminal stenosis is multifactorial. Right foramen appears patent.  Bilateral facet arthrosis accounts for anterolisthesis.  High T2 signal is present within the disc space, compatible with vacuum disc rather than fluid.  L5-S1:  Disc desiccation.  Small vacuum disc.  Shallow broad-based disc bulging.  Central canal and lateral recesses are patent. Moderate right and mild left foraminal stenosis is multifactorial. Right greater than left facet arthrosis.  After Gadolinium administration, there is enhancement of the extruded L2-L3 disc periphery, with central nonenhancement.  A large enhancing fibroid is present in the anatomic pelvis.  IMPRESSION:  1.  Severe L2-L3 central stenosis associated with left paracentral disc extrusion and posterior ligamentum flavum redundancy and facet hypertrophy. Left lateral recess stenosis. Mild bilateral foraminal stenosis. 2.  Postoperative changes with decompression of the central canal L3-L4 and L4-L5.  Residual/recurrent foraminal stenosis as described above, potentially affecting the right L3 and L4 nerves. 3.   L5-S1 facet arthrosis with right greater than left foraminal stenosis.   Original Report Authenticated By: Andreas Newport, M.D.    Dg Chest Portable 1 View  05/19/2012  *RADIOLOGY REPORT*  Clinical Data: Shortness of breath and weakness.  PORTABLE CHEST - 1 VIEW  Comparison: 11/08/2010.  Findings: 1855 hours.  There is chronic elevation of the right hemidiaphragm with associated chronic right basilar atelectasis. Overall appearance is similar.  No superimposed airspace disease, edema or significant pleural effusion is seen.  Heart size and mediastinal contours are stable.  IMPRESSION: Stable chronic elevation of the right hemidiaphragm and right basilar atelectasis.  No acute cardiopulmonary process identified.   Original Report Authenticated By: Carey Bullocks, M.D.    Dg Spine Portable 1 View  05/20/2012  *RADIOLOGY REPORT*  Clinical Data: Lumbar spinal stenosis.  Intraoperative localization.  PORTABLE SPINE - 1 VIEW 05/20/2012 1706 hours:  Comparison: Portable intraoperative lateral lumbar spine earlier same date 1640 hours.  Findings: Tissue spreaders are in place.  The localizer device is directed toward the L2-3 disc space posteriorly.  IMPRESSION: Intraoperative localization of L2-3.   Original Report Authenticated By: Hulan Saas, M.D.    Dg Spine Portable 1 View  05/20/2012  *RADIOLOGY REPORT*  Clinical Data: Lumbar spinal stenosis.  Intraoperative localization.  PORTABLE  SPINE - 1 VIEW 05/20/2012 1640 hours:  Comparison: MRI lumbar spine yesterday.  Findings: The same numbering scheme will be utilized as on the MRI. Five non-rib bearing lumbar vertebrae.  Prior posterior decompression at L3 and L4.  Localizer needles adjacent to the L2 spinous process and posteriorly at the L3-4 level.  IMPRESSION: Localizer needles adjacent to the L2 spinous process and posteriorly at the L3-4 level.   Original Report Authenticated By: Hulan Saas, M.D.     Scheduled Meds:    . carbidopa-levodopa   1.5 tablet Oral QID   And  . entacapone  200 mg Oral QID  .  ceFAZolin (ANCEF) IV  1 g Intravenous Q8H  . cycloSPORINE  1 drop Both Eyes BID  . levothyroxine  75 mcg Oral QAC breakfast  . lisinopril  10 mg Oral Daily  . metoprolol succinate  25 mg Oral Daily  . pantoprazole  40 mg Oral Daily  . [EXPIRED] predniSONE  5 mg Oral BID WC   Followed by  . [COMPLETED] predniSONE  5 mg Oral Q breakfast  . [DISCONTINUED]  ceFAZolin (ANCEF) IV  2 g Intravenous Q8H  . [DISCONTINUED] metoprolol succinate  50 mg Oral Daily  . [DISCONTINUED] pneumococcal 23 valent vaccine  0.5 mL Intramuscular Tomorrow-1000   Continuous Infusions:    . lactated ringers    . lactated ringers 100 mL/hr at 05/21/12 0331        [facility]  [facility] Pager [phone]. If 8PM-8AM, please contact night-coverage at [email or web address], password [facility] 05/21/2012, 9:12 AM  LOS: 2 days

## 2012-05-21 NOTE — Evaluation (Signed)
Physical Therapy Evaluation Patient Details Name: [name] MRN: [id number] DOB: [name] 29, 1941 Today's Date: 05/21/2012 Time: 4098-1191 PT Time Calculation (min): 20 min  PT Assessment / Plan / Recommendation Clinical Impression  71 yo female s/p lumbar laminectomy/decompression, microdiscectomy L2-L3. Pt also has hx of Parkinson's disease. Pt required +2 assist for ambulation due to bilateral knee buckling. Recommend SNF for continued rehab to improve gait and balance.     PT Assessment  Patient needs continued PT services    Follow Up Recommendations  SNF    Does the patient have the potential to tolerate intense rehabilitation      Barriers to Discharge        Equipment Recommendations  Rolling walker with 5" wheels    Recommendations for Other Services OT consult   Frequency Min 3X/week    Precautions / Restrictions Precautions Precautions: Back Precaution Comments: verbally reviewed back precautions Restrictions Weight Bearing Restrictions: No   Pertinent Vitals/Pain 0/10 at rest; some pain with mobility      Mobility  Bed Mobility Bed Mobility: Rolling Right;Right Sidelying to Sit;Sit to Supine Rolling Right: 4: Min assist Right Sidelying to Sit: 4: Min assist Sit to Supine: 3: Mod assist Details for Bed Mobility Assistance: Multimodal cues for safety, technique, hand placement. Assist for trunk upright/supine and for bil LEs onto bed.  Transfers Transfers: Sit to Stand;Stand to Sit Sit to Stand: 3: Mod assist;From bed;From toilet Stand to Sit: 3: Mod assist;To toilet;To bed Details for Transfer Assistance: Multimodal cues for safety,technique, hand placement. assist to rise and shift weight anteriorly due to heavy posterior leaning. Pt with significant difficulty finding COG within BOS. Assist to control descent.  Ambulation/Gait Ambulation/Gait Assistance: 1: +2 Total assist Ambulation/Gait: Patient Percentage: 50% Ambulation Distance (Feet): 15 Feet  (15' x 2) Assistive device: Rolling walker Ambulation/Gait Assistance Details: Multimodal cues for safety, posture/balance, step length/width. Assist to keep weight shifted anteriorly, to keep pt fully upright, to maneuver wth RW,and to maintain balance throughout ambulation. Knees did buckle during walk back to bed from bathroom.  Gait Pattern: Trunk flexed;Step-through pattern;Decreased stride length;Decreased step length - right;Decreased step length - left;Shuffle;Narrow base of support    Shoulder Instructions     Exercises     PT Diagnosis: Difficulty walking;Abnormality of gait;Acute pain  PT Problem List: Decreased activity tolerance;Decreased balance;Decreased mobility;Pain;Decreased knowledge of precautions;Decreased knowledge of use of DME PT Treatment Interventions: DME instruction;Gait training;Functional mobility training;Therapeutic activities;Therapeutic exercise;Balance training;Patient/family education   PT Goals Acute Rehab PT Goals PT Goal Formulation: With patient/family Time For Goal Achievement: 06/04/12 Potential to Achieve Goals: Good Pt will Roll Supine to Right Side: with supervision PT Goal: Rolling Supine to Right Side - Progress: Goal set today Pt will go Supine/Side to Sit: with supervision PT Goal: Supine/Side to Sit - Progress: Goal set today Pt will go Sit to Supine/Side: with supervision PT Goal: Sit to Supine/Side - Progress: Goal set today Pt will go Sit to Stand: with min assist PT Goal: Sit to Stand - Progress: Goal set today Pt will Ambulate: 16 - 50 feet;with min assist;with least restrictive assistive device PT Goal: Ambulate - Progress: Goal set today  Visit Information  Last PT Received On: 05/21/12 Assistance Needed: +2 PT/OT Co-Evaluation/Treatment: Yes    Subjective Data  Subjective: "did I poop?" Patient Stated Goal: none stated   Prior Functioning  Home Living Lives With: Family Available Help at Discharge: Skilled Nursing  Facility Additional Comments: plan is for snf for rehab Prior Function Level of  Independence: Needs assistance Driving: No Communication Communication: No difficulties    Cognition  Overall Cognitive Status: History of cognitive impairments - at baseline Area of Impairment: Memory;Problem solving;Awareness of deficits;Awareness of errors Arousal/Alertness: Awake/alert Orientation Level: Appears intact for tasks assessed Behavior During Session: [facility] for tasks performed Memory: Decreased recall of precautions Awareness of Errors: Assistance required to identify errors made;Assistance required to correct errors made Cognition - Other Comments: needs reinforcement with back precautions.  Decreased memory for PLOF    Extremity/Trunk Assessment RRight Lower Extremity Assessment RLE ROM/Strength/Tone: Deficits RLE ROM/Strength/Tone Deficits: strength at least 3+/5, except DF/PF not tested Left Lower Extremity Assessment LLE ROM/Strength/Tone: Deficits LLE ROM/Strength/Tone Deficits: strength at least 3+/5, except DF/PF not tested Trunk Assessment Trunk Assessment: Normal   Balance Balance Balance Assessed: Yes Static Standing Balance Static Standing - Balance Support: During functional activity;Bilateral upper extremity supported Static Standing - Level of Assistance: 3: Mod assist Dynamic Standing Balance Dynamic Standing - Balance Support: Bilateral upper extremity supported;During functional activity Dynamic Standing - Level of Assistance: 2: Max assist  End of Session PT - End of Session Equipment Utilized During Treatment: Gait belt Activity Tolerance: Patient limited by fatigue Patient left: in bed;with call bell/phone within reach;with family/visitor present Nurse Communication: Mobility status (RN in room during eval)  GP     Rebeca Alert [place]mere 05/21/2012, 12:09 PM [phone]

## 2012-05-21 NOTE — Op Note (Signed)
[name], [name] NO.:  [id number]  MEDICAL RECORD NO.:  [id number]  LOCATION:  1601                         FACILITY:  [facility]  PHYSICIAN:  Georges Lynch. Jedi Catalfamo, M.D.DATE OF BIRTH:  05/12/1941  DATE OF PROCEDURE:  05/20/2012 DATE OF DISCHARGE:                              OPERATIVE REPORT   SURGEON:  Georges Lynch. Darrelyn Hillock, MD  ASSISTANT:  Jene Every, MD  PREOPERATIVE DIAGNOSES: 1. Severe spinal stenosis at L2-L3. 2. Previous decompressive lumbar laminectomy, L3-L4 and L4-L5. 3. Calcified disk herniation at L2-L3.  POSTOPERATIVE DIAGNOSES: 1. Severe spinal stenosis at L2-L3. 2. Previous decompressive lumbar laminectomy, L3-L4 and L4-L5. 3. Calcified disk herniation at L2-L3. 4. Complete decompressive lumbar laminectomy at L2-L3 for severe     spinal stenosis. 5. Exploration of the L2-3 disk. 6. Decompression of 2 nerve roots bilaterally at L2 and L3 bilaterally     with foraminotomy.  PROCEDURE:  Under general anesthesia with the patient on spinal frame, routine orthopedic prep and draping of the lower back was carried out. She had 2 g of IV Ancef.  At this time, the appropriate time-out was carried out.  Following that, 2 needles were placed in the back for localization purposes and x-ray was taken.  An incision was made over L2- 3, L3-4 region.  Bleeders were identified and cauterized.  Note this lady had been on Plavix.  She said she did not take any last week but she did about the day and a half ago.  We did have all the precautions preop.  I had platelets prepared just in case we need them during surgery which we obviously did not.  After that 2 needles were removed and the interspace marked.  I then injected 20 mL of 0.25% Marcaine and epinephrine in the surrounding soft tissue to avoid bleeding.  Incision then was made in the spinous process of L2 which was identified.  The dissection was carried out bilaterally.  We identified the lamina of  the L2-3.  At this point, we then removed the entire spinous process and decompressed centrally with a complete decompression of L2-L3.  We explored the disk space first what was central to the left.  This was a hard disk.  It was not really causing the major compression.  The major compression was posteriorly. We brought the microscope in and the ligamentum flavum was extra thickened in this area.  It was causing severe compression on the dural sac.  We first gently removed that by utilizing the microscope and we protected the dura with some cottonoids. We then stripped the ligamentum flavum off the dura in usual fashion. We went out far laterally and decompressed the lateral recesses to make sure we had complete decompression.  We did foraminotomies for the L2 and the L3 roots bilaterally.  We thoroughly irrigated out the area.  We rechecked the disk area and there was no definite herniation.  It was __________ for diskectomy.  The hockey-stick was utilized to enter the foramina bilaterally at 2 levels there.  We also passed the hockey-stick proximally and distally until to make sure we were thoroughly decompressed in both directions and we were.  The decompression  was stopped in both directions after we made sure that we had a wide opening decompression.  I thoroughly irrigated out the area.  Then I loosely injected FloSeal into the surrounding soft tissue areas and bony areas. We then lightly applied some thrombin-soaked Gelfoam.  Note, we made sure there were no pressure put on the sac.  Following that the wound then was closed in layers in usual fashion.  I did leave a small deep distal and proximal part of the wound open for drainage purposes, so any bleeding would drain into the subcu space.  Prior to closing the skin with staples, I injected the remaining 10 mL of 0.25% Marcaine and epinephrine into the soft tissue.  The skin was closed with metal staples and sterile Neosporin  dressing was applied.  The patient left the operating room in satisfactory condition.          ______________________________ Georges Lynch Darrelyn Hillock, M.D.     RAG/MEDQ  D:  05/20/2012  T:  05/21/2012  Job:  [id number]  cc:   Lonzo Cloud. Kriste Basque, MD [address] [place] [address]

## 2012-05-21 NOTE — Progress Notes (Signed)
Clinical Social Work Department CLINICAL SOCIAL WORK PLACEMENT NOTE 05/21/2012  Patient:  [name], [name]  Account Number:  [id number] Admit date:  05/19/2012  Clinical Social Worker:  Cori Razor, LCSW  Date/time:  05/21/2012 12:26 PM  Clinical Social Work is seeking post-discharge placement for this patient at the following level of care:   SKILLED NURSING   (*CSW will update this form in Epic as items are completed)   05/21/2012  Patient/family provided with [facility] Department of Clinical Social Work's list of facilities offering this level of care within the geographic area requested by the patient (or if unable, by the patient's family).  05/21/2012  Patient/family informed of their freedom to choose among providers that offer the needed level of care, that participate in Medicare, Medicaid or managed care program needed by the patient, have an available bed and are willing to accept the patient.    Patient/family informed of [facility]' ownership interest in [facility], as well as of the fact that they are under no obligation to receive care at this facility.  PASARR submitted to EDS on 05/21/2012 PASARR number received from EDS on 05/21/2012  FL2 transmitted to all facilities in geographic area requested by pt/family on  05/21/2012 FL2 transmitted to all facilities within larger geographic area on   Patient informed that his/her managed care company has contracts with or will negotiate with  certain facilities, including the following:     Patient/family informed of bed offers received:  05/21/2012 Patient chooses bed at [facility] Physician recommends and patient chooses bed at    Patient to be transferred to  on   Patient to be transferred to facility by   The following physician request were entered in Epic:   Additional Comments:  Cori Razor LCSW [phone]

## 2012-05-21 NOTE — Progress Notes (Signed)
   Subjective: 1 Day Post-Op Procedure(s) (LRB): LUMBAR LAMINECTOMY/DECOMPRESSION MICRODISCECTOMY 2 LEVELS (N/A) Patient reports pain as mild.   Patient seen in rounds without Dr. Darrelyn Hillock. Patient is well, and has had no acute complaints or problems. She reports that she feels much better. She does have some pain in the back but no radicular symptoms this morning. No issues overnight. Denies shortness of breath or chest pain. Family members in the room with her wish for her to go to SNF after hospital stay.    Objective: Vital signs in last 24 hours: Temp:  [97.4 F (36.3 C)-101 F (38.3 C)] 99.1 F (37.3 C) (11/27 0657) Pulse Rate:  [66-120] 90  (11/27 0657) Resp:  [12-23] 14  (11/27 0657) BP: (123-218)/(71-195) 137/80 mmHg (11/27 0657) SpO2:  [90 %-100 %] 96 % (11/27 0657)  Intake/Output from previous day:  Intake/Output Summary (Last 24 hours) at 05/21/12 0729 Last data filed at 05/21/12 0658  Gross per 24 hour  Intake   3160 ml  Output   1575 ml  Net   1585 ml     Labs:  Basename 05/19/12 2000 05/19/12 1300  HGB 13.3 14.1    Basename 05/19/12 2000 05/19/12 1300  WBC 6.8 8.6  RBC 4.27 4.45  HCT 39.6 41.0  PLT 220 224    Basename 05/19/12 2000 05/19/12 1300  NA 139 137  K 4.1 4.2  CL 107 101  CO2 25 26  BUN 17 22  CREATININE 0.50 0.72  GLUCOSE 167* 127*  CALCIUM 9.0 9.5    Basename 05/19/12 2000  LABPT --  INR 1.10    EXAM General - Patient is Alert and Oriented Extremity - Intact pulses distally Dorsiflexion/Plantar flexion intact Dressing/Incision - dressing C/D/I Motor Function - intact, moving foot and toes well on exam.   Past Medical History  Diagnosis Date  . Glaucoma(365)   . Abnormal chest x-ray   . Hypertension   . Peripheral vascular disease   . Venous insufficiency   . Hypercholesteremia   . Hypothyroid   . Diverticulosis of colon   . DJD (degenerative joint disease)   . Hip pain   . Lumbar back pain   . Osteopenia   .  Vitamin D deficiency   . Parkinson disease   . Anxiety   . Breast cancer     stage 0 left    Assessment/Plan: 1 Day Post-Op Procedure(s) (LRB): LUMBAR LAMINECTOMY/DECOMPRESSION MICRODISCECTOMY 2 LEVELS (N/A) Active Problems:  Stenosis, spinal, lumbar  Preoperative clearance  Estimated Body mass index is 23.94 kg/(m^2) as calculated from the following:   Height as of this encounter: 5\' 0" (1.524 m).   Weight as of this encounter: 122 lb 9.6 oz(55.611 kg). Advance diet Up with therapy Discharge to SNF tentative Friday Weight-Bearing as tolerated   Will start slow with therapy today with gait training and exercises, but must use walker. Social worker evaluating case as patient will likely need SNF placement.     Keyani Rigdon LAUREN 05/21/2012, 7:29 AM

## 2012-05-22 DIAGNOSIS — M48061 Spinal stenosis, lumbar region without neurogenic claudication: Principal | ICD-10-CM

## 2012-05-22 LAB — PREPARE PLATELET PHERESIS: Unit division: 0

## 2012-05-22 LAB — URINE CULTURE
Colony Count: NO GROWTH
Culture: NO GROWTH

## 2012-05-22 NOTE — Progress Notes (Signed)
[facility] PROGRESS NOTE  [name] GNF:[id number] DOB: 08/02/1940 DOA: 05/19/2012 PCP: Michele Mcalpine, MD  Assessment/Plan: Severe lumbar stenosis  -Management per primary team in terms of pain control and surgery. -Continue low metoprolol for now  -continue supportive care  Parkinson's disease  -Continue with stalevo   Peripheral vascular disease.  -aspirin and plavix on hold. -resume when appropriate as per ortho service recommendations.  Hypothyroidism  -Continue Synthroid and adjust if needed. -Recent TSH done in May 2013 WNL.  Hypertension  -Stable will continue lisinopril and now metoprolol for perioperative benefits (close monitoring of HR and BP)  Osteoporosis  -continue cholecalciferol   Hx of glaucoma  -Continue cyclosporin drops   Fever - resolved - yesterday. - possibly from atelectasis. Cbc showed slight elevation in WBC count. CXR Shows atelectasis no acute process, UA does not signify infection, though cultures pending. She already received a dose of antibiotic yesterday by primary service.    DVT prophylaxis and GI prophylaxis: SCD and Protonix  Code Status: Full Family Communication: no family at bedside Disposition Plan: d/c on Friday as per primary.  Consultants:  Ortho is primary  [facility] consulted for medical clearance and assistance with medical cormobidities.  Antibiotics:  ANCEF from 11/27-11/27  HPI/Subjective: No new complaints.   Objective: Filed Vitals:   05/22/12 0056 05/22/12 0411 05/22/12 0601 05/22/12 0800  BP: 102/60  124/68   Pulse: 65  64   Temp: 99.2 F (37.3 C)  99.6 F (37.6 C)   TempSrc: Oral  Oral   Resp: 18 15 16 16   Height:      Weight:      SpO2: 97% 95% 97%     Intake/Output Summary (Last 24 hours) at 05/22/12 1032 Last data filed at 05/22/12 0842  Gross per 24 hour  Intake   1080 ml  Output    400 ml  Net    680 ml   Filed Weights   05/19/12 2015  Weight: 55.611 kg (122 lb 9.6 oz)     Exam:   General:  Afebrile, NAD; asking to see her husband right away  Cardiovascular: S1 and S2, no rubs or gallops  Respiratory: CTA bilaterally  Abdomen: soft, NT, ND, positive BS  Extremities: no edema or cyanosis; decrease pulses bilaterally  Neuro: decrease MS bilaterally LE's (3/5), present reflexes, able to follow commands, CN intact  Data Reviewed: Basic Metabolic Panel:  Lab 05/19/12 8469 05/19/12 1300  NA 139 137  K 4.1 4.2  CL 107 101  CO2 25 26  GLUCOSE 167* 127*  BUN 17 22  CREATININE 0.50 0.72  CALCIUM 9.0 9.5  MG -- --  PHOS -- --   Liver Function Tests:  Lab 05/19/12 2000  AST 16  ALT <5  ALKPHOS 46  BILITOT 0.6  PROT 5.7*  ALBUMIN 3.2*   CBC:  Lab 05/21/12 1030 05/19/12 2000 05/19/12 1300  WBC 10.8* 6.8 8.6  NEUTROABS -- 5.6 7.7  HGB 13.1 13.3 14.1  HCT 38.6 39.6 41.0  MCV 92.6 92.7 92.1  PLT 207 220 224    Studies: Dg Chest 1 View  05/21/2012  *RADIOLOGY REPORT*  Clinical Data: Postop hip surgery, fever  CHEST - 1 VIEW  Comparison: 05/19/2012  Findings: Chronic right hemidiaphragm elevation.  Normal heart size and vascularity. Chronic right base atelectasis noted.  No effusion or pneumothorax.  Midline staples noted from recent surgery. Nonobstructive bowel gas pattern.  IMPRESSION: Stable right hemidiaphragm elevation and right base atelectasis / scarring.  No interval change or superimposed acute process   Original Report Authenticated By: Judie Petit. Miles Costain, M.D.    Dg Spine Portable 1 View  05/20/2012  *RADIOLOGY REPORT*  Clinical Data: Lumbar spinal stenosis.  Intraoperative localization.  PORTABLE SPINE - 1 VIEW 05/20/2012 1706 hours:  Comparison: Portable intraoperative lateral lumbar spine earlier same date 1640 hours.  Findings: Tissue spreaders are in place.  The localizer device is directed toward the L2-3 disc space posteriorly.  IMPRESSION: Intraoperative localization of L2-3.   Original Report Authenticated By: Hulan Saas,  M.D.    Dg Spine Portable 1 View  05/20/2012  *RADIOLOGY REPORT*  Clinical Data: Lumbar spinal stenosis.  Intraoperative localization.  PORTABLE SPINE - 1 VIEW 05/20/2012 1640 hours:  Comparison: MRI lumbar spine yesterday.  Findings: The same numbering scheme will be utilized as on the MRI. Five non-rib bearing lumbar vertebrae.  Prior posterior decompression at L3 and L4.  Localizer needles adjacent to the L2 spinous process and posteriorly at the L3-4 level.  IMPRESSION: Localizer needles adjacent to the L2 spinous process and posteriorly at the L3-4 level.   Original Report Authenticated By: Hulan Saas, M.D.     Scheduled Meds:    . carbidopa-levodopa  1.5 tablet Oral QID   And  . entacapone  200 mg Oral QID  . [COMPLETED]  ceFAZolin (ANCEF) IV  1 g Intravenous Q8H  . cycloSPORINE  1 drop Both Eyes BID  . levothyroxine  75 mcg Oral QAC breakfast  . lisinopril  10 mg Oral Daily  . metoprolol succinate  25 mg Oral Daily  . pantoprazole  40 mg Oral Daily   Continuous Infusions:    . lactated ringers    . lactated ringers 100 mL/hr at 05/21/12 0331        [facility]  [facility] Pager [phone]. If 8PM-8AM, please contact night-coverage at [email or web address], password [facility] 05/22/2012, 10:32 AM  LOS: 3 days

## 2012-05-22 NOTE — Discharge Summary (Signed)
Physician Discharge Summary   Patient ID: [name] MRN: [id number] DOB/AGE: 12/18/1940 71 y.o.  Admit date: 05/19/2012 Discharge date: 05/23/2012  Primary Diagnosis: Spinal stenosis, lumbar spine   Admission Diagnoses:  Past Medical History  Diagnosis Date  . Glaucoma(365)   . Abnormal chest x-ray   . Hypertension   . Peripheral vascular disease   . Venous insufficiency   . Hypercholesteremia   . Hypothyroid   . Diverticulosis of colon   . DJD (degenerative joint disease)   . Hip pain   . Lumbar back pain   . Osteopenia   . Vitamin D deficiency   . Parkinson disease   . Anxiety   . Breast cancer     stage 0 left   Discharge Diagnoses:   Active Problems:  Stenosis, spinal, lumbar  Preoperative clearance S/P lumbar laminectomy   Estimated Body mass index is 23.94 kg/(m^2) as calculated from the following:   Height as of this encounter: 5\' 0" (1.524 m).   Weight as of this encounter: 122 lb 9.6 oz(55.611 kg).  Classification of overweight in adults according to BMI (WHO, 1998)   Procedure:  Procedure(s) (LRB): LUMBAR LAMINECTOMY/DECOMPRESSION MICRODISCECTOMY 2 LEVELS (N/A)   Consults: Internal medicine  HPI: The patient is a 71 year old female who presents to Dr. Jeannetta Ellis office a few weeks ago with back pain and legs weakness. She was at that time able to ambulate.  According to her family, the last few days her legs have been giving out and she has had significant back pain. She was brought to the ER with this history. Gr. Gioffre performed two level decompressive lumbar laminectomy about six years ago. She has done very well until the last month.   Laboratory Data: Admission on 05/19/2012  Component Date Value Range Status  . WBC 05/19/2012 8.6  4.0 - 10.5 K/uL Final  . RBC 05/19/2012 4.45  3.87 - 5.11 MIL/uL Final  . Hemoglobin 05/19/2012 14.1  12.0 - 15.0 g/dL Final  . HCT 40/98/1191 41.0  36.0 - 46.0 % Final  . MCV 05/19/2012 92.1  78.0 - 100.0 fL  Final  . MCH 05/19/2012 31.7  26.0 - 34.0 pg Final  . MCHC 05/19/2012 34.4  30.0 - 36.0 g/dL Final  . RDW 47/82/9562 12.5  11.5 - 15.5 % Final  . Platelets 05/19/2012 224  150 - 400 K/uL Final  . Neutrophils Relative 05/19/2012 89* 43 - 77 % Final  . Neutro Abs 05/19/2012 7.7  1.7 - 7.7 K/uL Final  . Lymphocytes Relative 05/19/2012 6* 12 - 46 % Final  . Lymphs Abs 05/19/2012 0.6* 0.7 - 4.0 K/uL Final  . Monocytes Relative 05/19/2012 4  3 - 12 % Final  . Monocytes Absolute 05/19/2012 0.3  0.1 - 1.0 K/uL Final  . Eosinophils Relative 05/19/2012 0  0 - 5 % Final  . Eosinophils Absolute 05/19/2012 0.0  0.0 - 0.7 K/uL Final  . Basophils Relative 05/19/2012 0  0 - 1 % Final  . Basophils Absolute 05/19/2012 0.0  0.0 - 0.1 K/uL Final  . Sodium 05/19/2012 137  135 - 145 mEq/L Final  . Potassium 05/19/2012 4.2  3.5 - 5.1 mEq/L Final  . Chloride 05/19/2012 101  96 - 112 mEq/L Final  . CO2 05/19/2012 26  19 - 32 mEq/L Final  . Glucose, Bld 05/19/2012 127* 70 - 99 mg/dL Final  . BUN 13/01/6577 22  6 - 23 mg/dL Final  . Creatinine, Ser 05/19/2012 0.72  0.50 -  1.10 mg/dL Final  . Calcium 16/03/9603 9.5  8.4 - 10.5 mg/dL Final  . GFR calc non Af Amer 05/19/2012 83* >90 mL/min Final  . GFR calc Af Amer 05/19/2012 >90  >90 mL/min Final   Comment:                                 The eGFR has been calculated                          using the CKD EPI equation.                          This calculation has not been                          validated in all clinical                          situations.                          eGFR's persistently                          <90 mL/min signify                          possible Chronic Kidney Disease.  Marland Kitchen Prothrombin Time 05/19/2012 14.1  11.6 - 15.2 seconds Final  . INR 05/19/2012 1.10  0.00 - 1.49 Final  . WBC 05/19/2012 6.8  4.0 - 10.5 K/uL Final  . RBC 05/19/2012 4.27  3.87 - 5.11 MIL/uL Final  . Hemoglobin 05/19/2012 13.3  12.0 - 15.0 g/dL Final  .  HCT 54/02/8118 39.6  36.0 - 46.0 % Final  . MCV 05/19/2012 92.7  78.0 - 100.0 fL Final  . MCH 05/19/2012 31.1  26.0 - 34.0 pg Final  . MCHC 05/19/2012 33.6  30.0 - 36.0 g/dL Final  . RDW 14/78/2956 12.7  11.5 - 15.5 % Final  . Platelets 05/19/2012 220  150 - 400 K/uL Final  . Neutrophils Relative 05/19/2012 83* 43 - 77 % Final  . Neutro Abs 05/19/2012 5.6  1.7 - 7.7 K/uL Final  . Lymphocytes Relative 05/19/2012 13  12 - 46 % Final  . Lymphs Abs 05/19/2012 0.9  0.7 - 4.0 K/uL Final  . Monocytes Relative 05/19/2012 4  3 - 12 % Final  . Monocytes Absolute 05/19/2012 0.3  0.1 - 1.0 K/uL Final  . Eosinophils Relative 05/19/2012 0  0 - 5 % Final  . Eosinophils Absolute 05/19/2012 0.0  0.0 - 0.7 K/uL Final  . Basophils Relative 05/19/2012 0  0 - 1 % Final  . Basophils Absolute 05/19/2012 0.0  0.0 - 0.1 K/uL Final  . Sodium 05/19/2012 139  135 - 145 mEq/L Final  . Potassium 05/19/2012 4.1  3.5 - 5.1 mEq/L Final  . Chloride 05/19/2012 107  96 - 112 mEq/L Final  . CO2 05/19/2012 25  19 - 32 mEq/L Final  . Glucose, Bld 05/19/2012 167* 70 - 99 mg/dL Final  . BUN 21/30/8657 17  6 - 23 mg/dL Final  . Creatinine, Ser 05/19/2012 0.50  0.50 - 1.10 mg/dL Final  . Calcium 84/69/6295  9.0  8.4 - 10.5 mg/dL Final  . Total Protein 05/19/2012 5.7* 6.0 - 8.3 g/dL Final  . Albumin 96/09/5407 3.2* 3.5 - 5.2 g/dL Final  . AST 81/19/1478 16  0 - 37 U/L Final  . ALT 05/19/2012 <5  0 - 35 U/L Final   REPEATED TO VERIFY  . Alkaline Phosphatase 05/19/2012 46  39 - 117 U/L Final  . Total Bilirubin 05/19/2012 0.6  0.3 - 1.2 mg/dL Final  . GFR calc non Af Amer 05/19/2012 >90  >90 mL/min Final  . GFR calc Af Amer 05/19/2012 >90  >90 mL/min Final   Comment:                                 The eGFR has been calculated                          using the CKD EPI equation.                          This calculation has not been                          validated in all clinical                          situations.                           eGFR's persistently                          <90 mL/min signify                          possible Chronic Kidney Disease.  Marland Kitchen aPTT 05/19/2012 27  24 - 37 seconds Final  . Cholesterol 05/20/2012 145  0 - 200 mg/dL Final  . Triglycerides 05/20/2012 60  <150 mg/dL Final  . HDL 29/56/2130 64  >39 mg/dL Final  . Total CHOL/HDL Ratio 05/20/2012 2.3   Final  . VLDL 05/20/2012 12  0 - 40 mg/dL Final  . LDL Cholesterol 05/20/2012 69  0 - 99 mg/dL Final   Comment:                                 Total Cholesterol/HDL:CHD Risk                          Coronary Heart Disease Risk Table                                              Men   Women                           1/2 Average Risk   3.4   3.3                           Average Risk  5.0   4.4                           2 X Average Risk   9.6   7.1                           3 X Average Risk  23.4   11.0                                                          Use the calculated Patient Ratio                          above and the CHD Risk Table                          to determine the patient's CHD Risk.                                                          ATP III CLASSIFICATION (LDL):                           <100     mg/dL   Optimal                           100-129  mg/dL   Near or Above                                             Optimal                           130-159  mg/dL   Borderline                           160-189  mg/dL   High                           >190     mg/dL   Very High  . Hemoglobin A1C 05/20/2012 6.2* <5.7 % Final   Comment: (NOTE)  According to the ADA Clinical Practice Recommendations for 2011, when                          HbA1c is used as a screening test:                           >=6.5%   Diagnostic of Diabetes Mellitus                                    (if  abnormal result is confirmed)                          5.7-6.4%   Increased risk of developing Diabetes Mellitus                          References:Diagnosis and Classification of Diabetes Mellitus,Diabetes                          Care,2011,34(Suppl 1):S62-S69 and Standards of Medical Care in                                  Diabetes - 2011,Diabetes Care,2011,34 (Suppl 1):S11-S61.  . Mean Plasma Glucose 05/20/2012 131* <117 mg/dL Final  . ABO/RH(D) 16/03/9603 A POS   Final  . Antibody Screen 05/20/2012 NEG   Final  . Sample Expiration 05/20/2012 05/23/2012   Final  . Unit Number 05/20/2012 [id number]   Final  . Blood Component Type 05/20/2012 PLTPHER LI2   Final  . Unit division 05/20/2012 00   Final  . Status of Unit 05/20/2012 OUTDATED/DESTROYED   Final  . Transfusion Status 05/20/2012 OK TO TRANSFUSE   Final  . MRSA, PCR 05/20/2012 NEGATIVE  NEGATIVE Final  . Staphylococcus aureus 05/20/2012 NEGATIVE  NEGATIVE Final   Comment:                                 The Xpert SA Assay (FDA                          approved for NASAL specimens                          in patients over 18 years of age),                          is one component of                          a comprehensive surveillance                          program.  Test performance has                          been validated by [employer]  [facility] for patients greater                          than or equal to 15 year old.                          It is not intended                          to diagnose infection nor to                          guide or monitor treatment.  . WBC 05/21/2012 10.8* 4.0 - 10.5 K/uL Final  . RBC 05/21/2012 4.17  3.87 - 5.11 MIL/uL Final  . Hemoglobin 05/21/2012 13.1  12.0 - 15.0 g/dL Final  . HCT 16/03/9603 38.6  36.0 - 46.0 % Final  . MCV 05/21/2012 92.6  78.0 - 100.0 fL Final  . MCH 05/21/2012 31.4  26.0 - 34.0 pg Final  . MCHC 05/21/2012 33.9  30.0 - 36.0 g/dL Final    . RDW 54/02/8118 12.6  11.5 - 15.5 % Final  . Platelets 05/21/2012 207  150 - 400 K/uL Final  . Color, Urine 05/21/2012 YELLOW  YELLOW Final  . APPearance 05/21/2012 CLEAR  CLEAR Final  . Specific Gravity, Urine 05/21/2012 1.013  1.005 - 1.030 Final  . pH 05/21/2012 6.0  5.0 - 8.0 Final  . Glucose, UA 05/21/2012 NEGATIVE  NEGATIVE mg/dL Final  . Hgb urine dipstick 05/21/2012 NEGATIVE  NEGATIVE Final  . Bilirubin Urine 05/21/2012 NEGATIVE  NEGATIVE Final  . Ketones, ur 05/21/2012 NEGATIVE  NEGATIVE mg/dL Final  . Protein, ur 14/78/2956 NEGATIVE  NEGATIVE mg/dL Final  . Urobilinogen, UA 05/21/2012 0.2  0.0 - 1.0 mg/dL Final  . Nitrite 21/30/8657 NEGATIVE  NEGATIVE Final  . Leukocytes, UA 05/21/2012 SMALL* NEGATIVE Final  . Specimen Description 05/21/2012 URINE, CLEAN CATCH   Final  . Special Requests 05/21/2012 NONE   Final  . Culture  Setup Time 05/21/2012 05/21/2012 13:58   Final  . Colony Count 05/21/2012 NO GROWTH   Final  . Culture 05/21/2012 NO GROWTH   Final  . Report Status 05/21/2012 05/22/2012 FINAL   Final  . Squamous Epithelial / LPF 05/21/2012 RARE  RARE Final  . WBC, UA 05/21/2012 3-6  <3 WBC/hpf Final  . RBC / HPF 05/21/2012 0-2  <3 RBC/hpf Final  . Bacteria, UA 05/21/2012 FEW* RARE Final  . Urine-Other 05/21/2012 RARE YEAST   Final     X-Rays:Dg Chest 1 View  05/21/2012  *RADIOLOGY REPORT*  Clinical Data: Postop hip surgery, fever  CHEST - 1 VIEW  Comparison: 05/19/2012  Findings: Chronic right hemidiaphragm elevation.  Normal heart size and vascularity. Chronic right base atelectasis noted.  No effusion or pneumothorax.  Midline staples noted from recent surgery. Nonobstructive bowel gas pattern.  IMPRESSION: Stable right hemidiaphragm elevation and right base atelectasis / scarring.  No interval change or superimposed acute process   Original Report Authenticated By: Judie Petit. Miles Costain, M.D.    Dg Lumbar Spine 2-3 Views  05/06/2012  *RADIOLOGY REPORT*  Clinical Data: History  of low back pain.  LUMBAR SPINE - 2-3 VIEW  Comparison: .  12/05/2005.  Findings: Cholecystectomy clips are seen.  There is fecal distention of portions of the colon.  Phleboliths are seen in the pelvis.  There is sclerosis  seen on the iliac side of the SI joints bilaterally consistent with osteitis condensans ilii.  There are five non-rib bearing lumbar-type vertebral bodies which are labeled L1-L5 on lateral image.  There is moderate scoliosis convexity to the right.  There is narrowing of intervertebral disc spaces at levels of L3-L4, L4-L5, and L5-S1.  There is osteophyte formation representing degenerative spondylosis.  Previous partial laminectomies have been performed. There is 5 mm of anterior subluxation of the body of L3 on the body of L4.  There is 10 mm of anterior subluxation of the body of L4 on the body of L5.  No fracture is evident.  No obvious pars defects are seen on lateral image.  IMPRESSION: Changes of degenerative disc disease and degenerative spondylosis. Moderate scoliosis.  Previous partial laminectomies have been performed. There is 5 mm of anterior subluxation of the body of L3 on the body of L4.  There is 10 mm of anterior subluxation of the body of L4 on the body of L5.  Osteitis condensans ilii.  Post cholecystectomy.  Fecal distention of portions of the colon.   Original Report Authenticated By: Onalee Hua Call    Mr Lumbar Spine W Wo Contrast  05/19/2012  *RADIOLOGY REPORT*  Clinical Data: Progressively worsening lower extremity weakness. Inability to ambulate or weightbear.  History of breast cancer. Lumbar spine operation 2007.  MRI LUMBAR SPINE WITHOUT AND WITH CONTRAST  Technique:  Multiplanar and multiecho pulse sequences of the lumbar spine were obtained without and with intravenous contrast.  Contrast: 11mL MULTIHANCE GADOBENATE DIMEGLUMINE 529 MG/ML IV SOLN  Comparison: None.  Findings: Numbering convention used on prior exam preserved. Severe multilevel lumbar spondylosis.   Spinal cord terminates posterior to L2.  Vertebral body height preserved.  There is a dextroconvex lumbar scoliosis with the apex at L3.  Bilateral renal sinus cysts are present. Bone marrow signal shows suppression of fatty marrow, which is a nonspecific finding, commonly associated with anemia, chronic disease, cigarette smoking, and obesity.  Lower thoracic levels show an adequate patency of the central canal.  Disc desiccation is present throughout.  L1-L2:  Disc desiccation.  No stenosis.  L2-L3:  There is severe multifactorial central stenosis.  This is better seen on the sagittal images than on axial images.  Bunching of the nerve roots is present proximally.  L2-L3 disc degeneration with broad-based posterior bulging and left paracentral extrusion with both cranial and caudal migration of disc material.  Posterior ligamentum flavum redundancy contributes to the central stenosis. On sagittal imaging, there appears to be compression of the cauda equina.  Mild bilateral foraminal stenosis associated with bulging disc.  Left lateral recess stenosis associated with ligamentum flavum redundancy and extrusion.  L3-L4:  Severe disc desiccation.  Wide posterior decompression. Grade 1 anterolisthesis of L3 on L4 measuring 3 mm.  Vacuum disc. Moderate left foraminal stenosis.  Right foramen patent. Laminectomy.  L4-L5:  Grade 1 anterolisthesis measuring 6 mm.  Laminectomy with wide posterior decompression.  No recurrent central or lateral recess stenosis.  Severe left foraminal stenosis is multifactorial. Right foramen appears patent.  Bilateral facet arthrosis accounts for anterolisthesis.  High T2 signal is present within the disc space, compatible with vacuum disc rather than fluid.  L5-S1:  Disc desiccation.  Small vacuum disc.  Shallow broad-based disc bulging.  Central canal and lateral recesses are patent. Moderate right and mild left foraminal stenosis is multifactorial. Right greater than left facet arthrosis.   After Gadolinium administration, there is enhancement of the extruded  L2-L3 disc periphery, with central nonenhancement.  A large enhancing fibroid is present in the anatomic pelvis.  IMPRESSION:  1.  Severe L2-L3 central stenosis associated with left paracentral disc extrusion and posterior ligamentum flavum redundancy and facet hypertrophy. Left lateral recess stenosis. Mild bilateral foraminal stenosis. 2.  Postoperative changes with decompression of the central canal L3-L4 and L4-L5.  Residual/recurrent foraminal stenosis as described above, potentially affecting the right L3 and L4 nerves. 3.  L5-S1 facet arthrosis with right greater than left foraminal stenosis.   Original Report Authenticated By: Andreas Newport, M.D.    Dg Chest Portable 1 View  05/19/2012  *RADIOLOGY REPORT*  Clinical Data: Shortness of breath and weakness.  PORTABLE CHEST - 1 VIEW  Comparison: 11/08/2010.  Findings: 1855 hours.  There is chronic elevation of the right hemidiaphragm with associated chronic right basilar atelectasis. Overall appearance is similar.  No superimposed airspace disease, edema or significant pleural effusion is seen.  Heart size and mediastinal contours are stable.  IMPRESSION: Stable chronic elevation of the right hemidiaphragm and right basilar atelectasis.  No acute cardiopulmonary process identified.   Original Report Authenticated By: Carey Bullocks, M.D.    Dg Spine Portable 1 View  05/20/2012  *RADIOLOGY REPORT*  Clinical Data: Lumbar spinal stenosis.  Intraoperative localization.  PORTABLE SPINE - 1 VIEW 05/20/2012 1706 hours:  Comparison: Portable intraoperative lateral lumbar spine earlier same date 1640 hours.  Findings: Tissue spreaders are in place.  The localizer device is directed toward the L2-3 disc space posteriorly.  IMPRESSION: Intraoperative localization of L2-3.   Original Report Authenticated By: Hulan Saas, M.D.    Dg Spine Portable 1 View  05/20/2012  *RADIOLOGY REPORT*   Clinical Data: Lumbar spinal stenosis.  Intraoperative localization.  PORTABLE SPINE - 1 VIEW 05/20/2012 1640 hours:  Comparison: MRI lumbar spine yesterday.  Findings: The same numbering scheme will be utilized as on the MRI. Five non-rib bearing lumbar vertebrae.  Prior posterior decompression at L3 and L4.  Localizer needles adjacent to the L2 spinous process and posteriorly at the L3-4 level.  IMPRESSION: Localizer needles adjacent to the L2 spinous process and posteriorly at the L3-4 level.   Original Report Authenticated By: Hulan Saas, M.D.     EKG: Orders placed during the hospital encounter of 05/19/12  . EKG 12-LEAD  . EKG 12-LEAD  . EKG 12-LEAD  . EKG 12-LEAD     Hospital Course: [name] is a 71 y.o. who was admitted to [facility] on 05/19/12. They were brought to the operating room on 05/20/2012  and underwent Procedure(s): LUMBAR LAMINECTOMY/DECOMPRESSION MICRODISCECTOMY 2 LEVELS.  Patient tolerated the procedure well and was later transferred to the recovery room and then to the orthopaedic floor for postoperative care.  They were given PO and IV analgesics for pain control following their surgery.  They were given 24 hours of postoperative antibiotics of  Anti-infectives     Start     Dose/Rate Route Frequency Ordered Stop   05/21/12 0000   ceFAZolin (ANCEF) IVPB 1 g/50 mL premix        1 g 100 mL/hr over 30 Minutes Intravenous 3 times per day 05/20/12 2057 05/21/12 1530   05/20/12 1649   polymyxin B 500,000 Units, bacitracin 50,000 Units in sodium chloride irrigation 0.9 % 500 mL irrigation  Status:  Discontinued          As needed 05/20/12 1649 05/20/12 1823   05/20/12 1600   ceFAZolin (ANCEF) IVPB 2 g/50 mL premix  Status:  Discontinued        2 g 100 mL/hr over 30 Minutes Intravenous Every 8 hours 05/20/12 1553 05/20/12 2057         PT and OT were ordered for gait training and assistance.  Discharge planning consulted to help with postop  disposition and equipment needs. After discussion with Dr. Darrelyn Hillock, the family decided it would be best for her to go to SNF after hospital discharge Patient had a decent night on the evening of surgery and started to get up OOB with therapy on day one.  Continued to work with therapy into day two.  Patient was very pleased as she is now able to ambulate again. Dressing was changed on day two and the incision was clean and dyr.  By day three, the patient had progressed with therapy.  Incision was healing well.  Patient was seen in rounds and was ready to go to SNF.   Discharge Medications: Prior to Admission medications   Medication Sig Start Date End Date Taking? Authorizing Provider  aspirin 81 MG tablet Take 81 mg by mouth daily.     Yes Historical Provider, MD  Calcium Carbonate-Vitamin D (CALTRATE 600+D) 600-400 MG-UNIT per tablet Take 1 tablet by mouth 2 (two) times daily.     Yes Historical Provider, MD  carbidopa-levodopa-entacapone (STALEVO 150) 37.5-150-200 MG per tablet Take 1 tablet by mouth 5 (five) times daily as needed. As directed by Dr. Misty Stanley    Yes Historical Provider, MD  Cholecalciferol (VITAMIN D) 2000 UNITS CAPS Take 1 capsule by mouth daily.     Yes Historical Provider, MD  clopidogrel (PLAVIX) 75 MG tablet TAKE 1 TABLET EVERY DAY 11/28/11  Yes Michele Mcalpine, MD  cycloSPORINE (RESTASIS) 0.05 % ophthalmic emulsion Place 1 drop into both eyes 2 (two) times daily.     Yes Historical Provider, MD  levothyroxine (SYNTHROID, LEVOTHROID) 75 MCG tablet TAKE 1 TABLET EVERY DAY 02/05/12  Yes Michele Mcalpine, MD  lisinopril (PRINIVIL,ZESTRIL) 10 MG tablet TAKE 1 TABLET EVERY DAY 11/28/11  Yes Michele Mcalpine, MD  Multiple Vitamins-Minerals (WOMENS MULTIVITAMIN PLUS PO) Take 1 tablet by mouth daily.     Yes Historical Provider, MD  predniSONE (STERAPRED UNI-PAK) 5 MG TABS Take 5 mg by mouth as directed. 05/11/12 05/21/12 Yes Historical Provider, MD  methocarbamol (ROBAXIN) 500 MG tablet Take 1  tablet (500 mg total) by mouth every 6 (six) hours as needed. 05/21/12   Shirlena Brinegar Tamala Ser, PA  oxyCODONE-acetaminophen (PERCOCET/ROXICET) 5-325 MG per tablet Take 1-2 tablets by mouth every 4 (four) hours as needed. 05/21/12   Henley Blyth Tamala Ser, PA  polyethylene glycol (MIRALAX / GLYCOLAX) packet Take 17 g by mouth daily as needed. 05/21/12   Shirel Mallis Tamala Ser, PA    Diet: Regular diet Activity:WBAT Follow-up:in 2 weeks Disposition - Skilled nursing facility Discharged Condition: fair   Discharge Orders    Future Appointments: Provider: Department: Dept Phone: Center:   12/08/2012 9:30 AM Krista Blue [facility] MEDICAL ONCOLOGY [phone] None   12/08/2012 10:00 AM Victorino December, MD [facility] MEDICAL ONCOLOGY [phone] None     Future Orders Please Complete By Expires   Diet - low sodium heart healthy      Call MD / Call 911      Comments:   If you experience chest pain or shortness of breath, CALL 911 and be transported to the hospital emergency room.  If you develope a fever  above 101 F, pus (white drainage) or increased drainage or redness at the wound, or calf pain, call your surgeon's office.   Constipation Prevention      Comments:   Drink plenty of fluids.  Prune juice may be helpful.  You may use a stool softener, such as Colace (over the counter) 100 mg twice a day.  Use MiraLax (over the counter) for constipation as needed.   Increase activity slowly as tolerated      Discharge instructions      Comments:   Change your dressing daily. Shower only, no tub bath. Call if any temperatures greater than 101 or any wound complications: [phone] during the day and ask for Dr. Jeannetta Ellis nurse, Mackey [place]. Weight bearing as tolerated Walk with your walker   Driving restrictions      Comments:   No driving       Medication List     As of 05/22/2012  6:34 PM    STOP taking these medications         acetaminophen 500  MG tablet   Commonly known as: TYLENOL      TAKE these medications         aspirin 81 MG tablet   Take 81 mg by mouth daily.      CALTRATE 600+D 600-400 MG-UNIT per tablet   Generic drug: Calcium Carbonate-Vitamin D   Take 1 tablet by mouth 2 (two) times daily.      clopidogrel 75 MG tablet   Commonly known as: PLAVIX   TAKE 1 TABLET EVERY DAY      levothyroxine 75 MCG tablet   Commonly known as: SYNTHROID, LEVOTHROID   TAKE 1 TABLET EVERY DAY      lisinopril 10 MG tablet   Commonly known as: PRINIVIL,ZESTRIL   TAKE 1 TABLET EVERY DAY      methocarbamol 500 MG tablet   Commonly known as: ROBAXIN   Take 1 tablet (500 mg total) by mouth every 6 (six) hours as needed.      oxyCODONE-acetaminophen 5-325 MG per tablet   Commonly known as: PERCOCET/ROXICET   Take 1-2 tablets by mouth every 4 (four) hours as needed.      polyethylene glycol packet   Commonly known as: MIRALAX / GLYCOLAX   Take 17 g by mouth daily as needed.      predniSONE 5 MG Tabs   Commonly known as: STERAPRED UNI-PAK   Take 5 mg by mouth as directed.      RESTASIS 0.05 % ophthalmic emulsion   Generic drug: cycloSPORINE   Place 1 drop into both eyes 2 (two) times daily.      STALEVO 150 37.5-150-200 MG per tablet   Generic drug: carbidopa-levodopa-entacapone   Take 1 tablet by mouth 5 (five) times daily as needed. As directed by Dr. Misty Stanley        Vitamin D 2000 UNITS Caps   Take 1 capsule by mouth daily.      WOMENS MULTIVITAMIN PLUS PO   Take 1 tablet by mouth daily.         SignedCeledonio Savage Taha Dimond LAUREN 05/22/2012, 6:34 PM

## 2012-05-22 NOTE — Progress Notes (Signed)
Subjective: 2 Days Post-Op Procedure(s) (LRB): LUMBAR LAMINECTOMY/DECOMPRESSION MICRODISCECTOMY 2 LEVELS (N/A) Patient reports pain as 2 on 0-10 scale.Dressing changed and wound looks fine.Will DC to [facility]. Continues to have Pre-Op Incontinence but leg strength is MarkedlymImproved and NOW she is able to ambulate again.    Objective: Vital signs in last 24 hours: Temp:  [97.6 F (36.4 C)-99.6 F (37.6 C)] 99.6 F (37.6 C) (11/28 0601) Pulse Rate:  [57-77] 64  (11/28 0601) Resp:  [15-18] 16  (11/28 0800) BP: (92-127)/(54-74) 124/68 mmHg (11/28 0601) SpO2:  [95 %-99 %] 97 % (11/28 0601)  Intake/Output from previous day: 11/27 0701 - 11/28 0700 In: 1080 [P.O.:1080] Out: 600 [Urine:600] Intake/Output this shift:     Basename 05/21/12 1030 05/19/12 2000 05/19/12 1300  HGB 13.1 13.3 14.1    Basename 05/21/12 1030 05/19/12 2000  WBC 10.8* 6.8  RBC 4.17 4.27  HCT 38.6 39.6  PLT 207 220    Basename 05/19/12 2000 05/19/12 1300  NA 139 137  K 4.1 4.2  CL 107 101  CO2 25 26  BUN 17 22  CREATININE 0.50 0.72  GLUCOSE 167* 127*  CALCIUM 9.0 9.5    Basename 05/19/12 2000  LABPT --  INR 1.10    No cellulitis present  Assessment/Plan: 2 Days Post-Op Procedure(s) (LRB): LUMBAR LAMINECTOMY/DECOMPRESSION MICRODISCECTOMY 2 LEVELS (N/A) Up with therapy  Khrista Braun A 05/22/2012, 8:04 AM

## 2012-05-23 DIAGNOSIS — M48061 Spinal stenosis, lumbar region without neurogenic claudication: Secondary | ICD-10-CM | POA: Diagnosis not present

## 2012-05-23 DIAGNOSIS — I10 Essential (primary) hypertension: Secondary | ICD-10-CM | POA: Diagnosis not present

## 2012-05-23 DIAGNOSIS — I739 Peripheral vascular disease, unspecified: Secondary | ICD-10-CM | POA: Diagnosis not present

## 2012-05-23 DIAGNOSIS — M48062 Spinal stenosis, lumbar region with neurogenic claudication: Secondary | ICD-10-CM | POA: Diagnosis not present

## 2012-05-23 DIAGNOSIS — M1990 Unspecified osteoarthritis, unspecified site: Secondary | ICD-10-CM | POA: Diagnosis not present

## 2012-05-23 DIAGNOSIS — I872 Venous insufficiency (chronic) (peripheral): Secondary | ICD-10-CM | POA: Diagnosis not present

## 2012-05-23 DIAGNOSIS — I119 Hypertensive heart disease without heart failure: Secondary | ICD-10-CM | POA: Diagnosis not present

## 2012-05-23 DIAGNOSIS — E785 Hyperlipidemia, unspecified: Secondary | ICD-10-CM | POA: Diagnosis not present

## 2012-05-23 DIAGNOSIS — Z5189 Encounter for other specified aftercare: Secondary | ICD-10-CM | POA: Diagnosis not present

## 2012-05-23 DIAGNOSIS — E039 Hypothyroidism, unspecified: Secondary | ICD-10-CM | POA: Diagnosis not present

## 2012-05-23 DIAGNOSIS — R269 Unspecified abnormalities of gait and mobility: Secondary | ICD-10-CM | POA: Diagnosis not present

## 2012-05-23 DIAGNOSIS — H409 Unspecified glaucoma: Secondary | ICD-10-CM | POA: Diagnosis not present

## 2012-05-23 DIAGNOSIS — K5900 Constipation, unspecified: Secondary | ICD-10-CM | POA: Diagnosis not present

## 2012-05-23 DIAGNOSIS — G20 Parkinson's disease: Secondary | ICD-10-CM | POA: Diagnosis not present

## 2012-05-23 DIAGNOSIS — Z4889 Encounter for other specified surgical aftercare: Secondary | ICD-10-CM | POA: Diagnosis not present

## 2012-05-23 DIAGNOSIS — F411 Generalized anxiety disorder: Secondary | ICD-10-CM | POA: Diagnosis not present

## 2012-05-23 DIAGNOSIS — M4800 Spinal stenosis, site unspecified: Secondary | ICD-10-CM | POA: Diagnosis not present

## 2012-05-23 DIAGNOSIS — M810 Age-related osteoporosis without current pathological fracture: Secondary | ICD-10-CM | POA: Diagnosis not present

## 2012-05-23 DIAGNOSIS — Z4789 Encounter for other orthopedic aftercare: Secondary | ICD-10-CM | POA: Diagnosis not present

## 2012-05-23 MED ORDER — POLYETHYLENE GLYCOL 3350 17 G PO PACK: 17.0000 g | PACK | Freq: Every day | ORAL | Status: DC | PRN

## 2012-05-23 MED ORDER — BISACODYL 10 MG RE SUPP: 10.0000 mg | Freq: Every day | RECTAL | Status: DC | PRN

## 2012-05-23 MED ORDER — DOCUSATE SODIUM 100 MG PO CAPS
100.0000 mg | ORAL_CAPSULE | Freq: Two times a day (BID) | ORAL | Status: DC
  Administered 2012-05-23: 100 mg via ORAL

## 2012-05-23 NOTE — Progress Notes (Signed)
Subjective:  Discharge Note 3 Days Post-Op Procedure(s) (LRB): LUMBAR LAMINECTOMY/DECOMPRESSION MICRODISCECTOMY 2 LEVELS (N/A) Patient reports pain as mild.  She is feeling better today.  Her right leg feels better since surgery.  Some soreness in her back.  Family in room.  Plan is to go to [facility] today.  Will arrange transfer today.  Try suppository and add Miralax as needed.  Objective: Vital signs in last 24 hours: Temp:  [98.3 F (36.8 C)-98.4 F (36.9 C)] 98.4 F (36.9 C) (11/29 0612) Pulse Rate:  [58-82] 82  (11/29 0612) Resp:  [16-17] 16  (11/29 0800) BP: (94-135)/(60-75) 135/75 mmHg (11/29 0612) SpO2:  [96 %-98 %] 96 % (11/29 0612)  Intake/Output from previous day: 11/28 0701 - 11/29 0700 In: 780 [P.O.:780] Out: -     Basename 05/21/12 1030  HGB 13.1    Basename 05/21/12 1030  WBC 10.8*  RBC 4.17  HCT 38.6  PLT 207   No results found for this basename: NA:2,K:2,CL:2,CO2:2,BUN:2,CREATININE:2,GLUCOSE:2,CALCIUM:2 in the last 72 hours No results found for this basename: LABPT:2,INR:2 in the last 72 hours  Exam Neurologically intact Neurovascular intact Sensation intact distally Dorsiflexion/Plantar flexion intact Incision loos good and no signs of infection.  Assessment/Plan: 3 Days Post-Op Procedure(s) (LRB): LUMBAR LAMINECTOMY/DECOMPRESSION MICRODISCECTOMY 2 LEVELS (N/A) Up with therapy Discharge to SNF today Plan for [facility].  [name] 05/23/2012, 8:35 AM

## 2012-05-23 NOTE — Discharge Summary (Signed)
ADDENDUM - Physician Discharge Summary   Patient ID: [name] MRN: [id number] DOB/AGE: [birth year] 71 y.o.  Admit date: 05/19/2012 Discharge date: 05/23/2012  Primary Diagnosis:   Spinal stenosis lumbar 2 to lumbar3  Admission Diagnoses:  Past Medical History  Diagnosis Date  . Glaucoma(365)   . Abnormal chest x-ray   . Hypertension   . Peripheral vascular disease   . Venous insufficiency   . Hypercholesteremia   . Hypothyroid   . Diverticulosis of colon   . DJD (degenerative joint disease)   . Hip pain   . Lumbar back pain   . Osteopenia   . Vitamin D deficiency   . Parkinson disease   . Anxiety   . Breast cancer     stage 0 left   Discharge Diagnoses:   Active Problems:  Stenosis, spinal, lumbar  Preoperative clearance  Procedure:  Procedure(s) (LRB): LUMBAR LAMINECTOMY/DECOMPRESSION MICRODISCECTOMY 2 LEVELS (N/A)   Consults: None  HPI: The patient is a 71 year old female who presents to Dr. Jeannetta Ellis office a few weeks ago with back pain and legs weakness. She was at that time able to ambulate. According to her family, the last few days her legs have been giving out and she has had significant back pain. She was brought to the ER with this history. Gr. Gioffre performed two level decompressive lumbar laminectomy about six years ago. She has done very well until the last month.    Laboratory Data: Appointment on 12/06/2011  Component Date Value Range Status  . WBC 12/06/2011 3.3* 3.9 - 10.3 10e3/uL Final  . NEUT# 12/06/2011 2.2  1.5 - 6.5 10e3/uL Final  . HGB 12/06/2011 13.0  11.6 - 15.9 g/dL Final  . HCT 40/98/1191 39.4  34.8 - 46.6 % Final  . Platelets 12/06/2011 183  145 - 400 10e3/uL Final  . MCV 12/06/2011 95.0  79.5 - 101.0 fL Final  . MCH 12/06/2011 31.3  25.1 - 34.0 pg Final  . MCHC 12/06/2011 33.0  31.5 - 36.0 g/dL Final  . RBC 47/82/9562 4.14  3.70 - 5.45 10e6/uL Final  . RDW 12/06/2011 13.3  11.2 - 14.5 % Final  . lymph# 12/06/2011 0.9  0.9  - 3.3 10e3/uL Final  . MONO# 12/06/2011 0.2  0.1 - 0.9 10e3/uL Final  . Eosinophils Absolute 12/06/2011 0.0  0.0 - 0.5 10e3/uL Final  . Basophils Absolute 12/06/2011 0.0  0.0 - 0.1 10e3/uL Final  . NEUT% 12/06/2011 66.3  38.4 - 76.8 % Final  . LYMPH% 12/06/2011 26.7  14.0 - 49.7 % Final  . MONO% 12/06/2011 5.6  0.0 - 14.0 % Final  . EOS% 12/06/2011 0.7  0.0 - 7.0 % Final  . BASO% 12/06/2011 0.7  0.0 - 2.0 % Final  . Sodium 12/06/2011 139  135 - 145 mEq/L Final  . Potassium 12/06/2011 3.9  3.5 - 5.3 mEq/L Final  . Chloride 12/06/2011 108  96 - 112 mEq/L Final  . CO2 12/06/2011 23  19 - 32 mEq/L Final  . Glucose, Bld 12/06/2011 94  70 - 99 mg/dL Final  . BUN 13/01/6577 16  6 - 23 mg/dL Final  . Creatinine, Ser 12/06/2011 0.80  0.50 - 1.10 mg/dL Final  . Total Bilirubin 12/06/2011 1.0  0.3 - 1.2 mg/dL Final  . Alkaline Phosphatase 12/06/2011 56  39 - 117 U/L Final  . AST 12/06/2011 16  0 - 37 U/L Final  . ALT 12/06/2011 <8  0 - 35 U/L Final  . Total  Protein 12/06/2011 6.2  6.0 - 8.3 g/dL Final  . Albumin 45/40/9811 4.0  3.5 - 5.2 g/dL Final  . Calcium 91/47/8295 8.9  8.4 - 10.5 mg/dL Final    Basename 62/13/08 1030  HGB 13.1    Basename 05/21/12 1030  WBC 10.8*  RBC 4.17  HCT 38.6  PLT 207   No results found for this basename: NA:2,K:2,CL:2,CO2:2,BUN:2,CREATININE:2,GLUCOSE:2,CALCIUM:2 in the last 72 hours No results found for this basename: LABPT:2,INR:2 in the last 72 hours  X-Rays:Dg Chest 1 View  05/21/2012  *RADIOLOGY REPORT*  Clinical Data: Postop hip surgery, fever  CHEST - 1 VIEW  Comparison: 05/19/2012  Findings: Chronic right hemidiaphragm elevation.  Normal heart size and vascularity. Chronic right base atelectasis noted.  No effusion or pneumothorax.  Midline staples noted from recent surgery. Nonobstructive bowel gas pattern.  IMPRESSION: Stable right hemidiaphragm elevation and right base atelectasis / scarring.  No interval change or superimposed acute process    Original Report Authenticated By: Judie Petit. Miles Costain, M.D.    Dg Lumbar Spine 2-3 Views  05/06/2012  *RADIOLOGY REPORT*  Clinical Data: History of low back pain.  LUMBAR SPINE - 2-3 VIEW  Comparison: .  12/05/2005.  Findings: Cholecystectomy clips are seen.  There is fecal distention of portions of the colon.  Phleboliths are seen in the pelvis.  There is sclerosis seen on the iliac side of the SI joints bilaterally consistent with osteitis condensans ilii.  There are five non-rib bearing lumbar-type vertebral bodies which are labeled L1-L5 on lateral image.  There is moderate scoliosis convexity to the right.  There is narrowing of intervertebral disc spaces at levels of L3-L4, L4-L5, and L5-S1.  There is osteophyte formation representing degenerative spondylosis.  Previous partial laminectomies have been performed. There is 5 mm of anterior subluxation of the body of L3 on the body of L4.  There is 10 mm of anterior subluxation of the body of L4 on the body of L5.  No fracture is evident.  No obvious pars defects are seen on lateral image.  IMPRESSION: Changes of degenerative disc disease and degenerative spondylosis. Moderate scoliosis.  Previous partial laminectomies have been performed. There is 5 mm of anterior subluxation of the body of L3 on the body of L4.  There is 10 mm of anterior subluxation of the body of L4 on the body of L5.  Osteitis condensans ilii.  Post cholecystectomy.  Fecal distention of portions of the colon.   Original Report Authenticated By: Onalee Hua Call    Mr Lumbar Spine W Wo Contrast  05/19/2012  *RADIOLOGY REPORT*  Clinical Data: Progressively worsening lower extremity weakness. Inability to ambulate or weightbear.  History of breast cancer. Lumbar spine operation 2007.  MRI LUMBAR SPINE WITHOUT AND WITH CONTRAST  Technique:  Multiplanar and multiecho pulse sequences of the lumbar spine were obtained without and with intravenous contrast.  Contrast: 11mL MULTIHANCE GADOBENATE DIMEGLUMINE  529 MG/ML IV SOLN  Comparison: None.  Findings: Numbering convention used on prior exam preserved. Severe multilevel lumbar spondylosis.  Spinal cord terminates posterior to L2.  Vertebral body height preserved.  There is a dextroconvex lumbar scoliosis with the apex at L3.  Bilateral renal sinus cysts are present. Bone marrow signal shows suppression of fatty marrow, which is a nonspecific finding, commonly associated with anemia, chronic disease, cigarette smoking, and obesity.  Lower thoracic levels show an adequate patency of the central canal.  Disc desiccation is present throughout.  L1-L2:  Disc desiccation.  No stenosis.  L2-L3:  There is severe multifactorial central stenosis.  This is better seen on the sagittal images than on axial images.  Bunching of the nerve roots is present proximally.  L2-L3 disc degeneration with broad-based posterior bulging and left paracentral extrusion with both cranial and caudal migration of disc material.  Posterior ligamentum flavum redundancy contributes to the central stenosis. On sagittal imaging, there appears to be compression of the cauda equina.  Mild bilateral foraminal stenosis associated with bulging disc.  Left lateral recess stenosis associated with ligamentum flavum redundancy and extrusion.  L3-L4:  Severe disc desiccation.  Wide posterior decompression. Grade 1 anterolisthesis of L3 on L4 measuring 3 mm.  Vacuum disc. Moderate left foraminal stenosis.  Right foramen patent. Laminectomy.  L4-L5:  Grade 1 anterolisthesis measuring 6 mm.  Laminectomy with wide posterior decompression.  No recurrent central or lateral recess stenosis.  Severe left foraminal stenosis is multifactorial. Right foramen appears patent.  Bilateral facet arthrosis accounts for anterolisthesis.  High T2 signal is present within the disc space, compatible with vacuum disc rather than fluid.  L5-S1:  Disc desiccation.  Small vacuum disc.  Shallow broad-based disc bulging.  Central canal  and lateral recesses are patent. Moderate right and mild left foraminal stenosis is multifactorial. Right greater than left facet arthrosis.  After Gadolinium administration, there is enhancement of the extruded L2-L3 disc periphery, with central nonenhancement.  A large enhancing fibroid is present in the anatomic pelvis.  IMPRESSION:  1.  Severe L2-L3 central stenosis associated with left paracentral disc extrusion and posterior ligamentum flavum redundancy and facet hypertrophy. Left lateral recess stenosis. Mild bilateral foraminal stenosis. 2.  Postoperative changes with decompression of the central canal L3-L4 and L4-L5.  Residual/recurrent foraminal stenosis as described above, potentially affecting the right L3 and L4 nerves. 3.  L5-S1 facet arthrosis with right greater than left foraminal stenosis.   Original Report Authenticated By: Andreas Newport, M.D.    Dg Chest Portable 1 View  05/19/2012  *RADIOLOGY REPORT*  Clinical Data: Shortness of breath and weakness.  PORTABLE CHEST - 1 VIEW  Comparison: 11/08/2010.  Findings: 1855 hours.  There is chronic elevation of the right hemidiaphragm with associated chronic right basilar atelectasis. Overall appearance is similar.  No superimposed airspace disease, edema or significant pleural effusion is seen.  Heart size and mediastinal contours are stable.  IMPRESSION: Stable chronic elevation of the right hemidiaphragm and right basilar atelectasis.  No acute cardiopulmonary process identified.   Original Report Authenticated By: Carey Bullocks, M.D.    Dg Spine Portable 1 View  05/20/2012  *RADIOLOGY REPORT*  Clinical Data: Lumbar spinal stenosis.  Intraoperative localization.  PORTABLE SPINE - 1 VIEW 05/20/2012 1706 hours:  Comparison: Portable intraoperative lateral lumbar spine earlier same date 1640 hours.  Findings: Tissue spreaders are in place.  The localizer device is directed toward the L2-3 disc space posteriorly.  IMPRESSION: Intraoperative  localization of L2-3.   Original Report Authenticated By: Hulan Saas, M.D.    Dg Spine Portable 1 View  05/20/2012  *RADIOLOGY REPORT*  Clinical Data: Lumbar spinal stenosis.  Intraoperative localization.  PORTABLE SPINE - 1 VIEW 05/20/2012 1640 hours:  Comparison: MRI lumbar spine yesterday.  Findings: The same numbering scheme will be utilized as on the MRI. Five non-rib bearing lumbar vertebrae.  Prior posterior decompression at L3 and L4.  Localizer needles adjacent to the L2 spinous process and posteriorly at the L3-4 level.  IMPRESSION: Localizer needles adjacent to the L2 spinous process and posteriorly at the L3-4 level.  Original Report Authenticated By: Hulan Saas, M.D.     EKG: Orders placed during the hospital encounter of 05/19/12  . EKG 12-LEAD  . EKG 12-LEAD  . EKG 12-LEAD  . EKG 12-LEAD     Hospital Course:  Addendum note.  Please see the original DC summary for the hospital course.  She was seen again of Friday 05/23/12.  Patient was doing fine and was ready to go to the SNF today.  Arrangements being made with Child psychotherapist and transfer over today.  Discharge Medications: Prior to Admission medications   Medication Sig Start Date End Date Taking? Authorizing Provider  aspirin 81 MG tablet Take 81 mg by mouth daily.     Yes Historical Provider, MD  Calcium Carbonate-Vitamin D (CALTRATE 600+D) 600-400 MG-UNIT per tablet Take 1 tablet by mouth 2 (two) times daily.     Yes Historical Provider, MD  carbidopa-levodopa-entacapone (STALEVO 150) 37.5-150-200 MG per tablet Take 1 tablet by mouth 5 (five) times daily as needed. As directed by Dr. Misty Stanley    Yes Historical Provider, MD  Cholecalciferol (VITAMIN D) 2000 UNITS CAPS Take 1 capsule by mouth daily.     Yes Historical Provider, MD  clopidogrel (PLAVIX) 75 MG tablet TAKE 1 TABLET EVERY DAY 11/28/11  Yes Michele Mcalpine, MD  cycloSPORINE (RESTASIS) 0.05 % ophthalmic emulsion Place 1 drop into both eyes 2 (two) times  daily.     Yes Historical Provider, MD  levothyroxine (SYNTHROID, LEVOTHROID) 75 MCG tablet TAKE 1 TABLET EVERY DAY 02/05/12  Yes Michele Mcalpine, MD  lisinopril (PRINIVIL,ZESTRIL) 10 MG tablet TAKE 1 TABLET EVERY DAY 11/28/11  Yes Michele Mcalpine, MD  Multiple Vitamins-Minerals (WOMENS MULTIVITAMIN PLUS PO) Take 1 tablet by mouth daily.     Yes Historical Provider, MD  methocarbamol (ROBAXIN) 500 MG tablet Take 1 tablet (500 mg total) by mouth every 6 (six) hours as needed. 05/21/12   Amber Tamala Ser, PA  oxyCODONE-acetaminophen (PERCOCET/ROXICET) 5-325 MG per tablet Take 1-2 tablets by mouth every 4 (four) hours as needed. 05/21/12   Amber Tamala Ser, PA  polyethylene glycol (MIRALAX / GLYCOLAX) packet Take 17 g by mouth daily as needed. 05/21/12   Amber Tamala Ser, PA    Diet: Cardiac diet Activity:WBAT Follow-up:in 2 weeks Disposition - Skilled nursing facility Discharged Condition: good   Discharge Orders    Future Appointments: Provider: Department: Dept Phone: Center:   12/08/2012 9:30 AM Krista [facility] MEDICAL ONCOLOGY [phone] None   12/08/2012 10:00 AM Victorino December, MD [facility] MEDICAL ONCOLOGY [phone] None     Future Orders Please Complete By Expires   Diet - low sodium heart healthy      Call MD / Call 911      Comments:   If you experience chest pain or shortness of breath, CALL 911 and be transported to the hospital emergency room.  If you develope a fever above 101 F, pus (white drainage) or increased drainage or redness at the wound, or calf pain, call your surgeon's office.   Constipation Prevention      Comments:   Drink plenty of fluids.  Prune juice may be helpful.  You may use a stool softener, such as Colace (over the counter) 100 mg twice a day.  Use MiraLax (over the counter) for constipation as needed.   Increase activity slowly as tolerated      Discharge instructions      Comments:  Change  your dressing daily. Shower only, no tub bath. Call if any temperatures greater than 101 or any wound complications: [phone] during the day and ask for Dr. Jeannetta Ellis nurse, Mackey Birchwood. Weight bearing as tolerated Walk with your walker   Driving restrictions      Comments:   No driving       Medication List     As of 05/23/2012 10:00 AM    STOP taking these medications         acetaminophen 500 MG tablet   Commonly known as: TYLENOL      TAKE these medications         aspirin 81 MG tablet   Take 81 mg by mouth daily.      CALTRATE 600+D 600-400 MG-UNIT per tablet   Generic drug: Calcium Carbonate-Vitamin D   Take 1 tablet by mouth 2 (two) times daily.      clopidogrel 75 MG tablet   Commonly known as: PLAVIX   TAKE 1 TABLET EVERY DAY      levothyroxine 75 MCG tablet   Commonly known as: SYNTHROID, LEVOTHROID   TAKE 1 TABLET EVERY DAY      lisinopril 10 MG tablet   Commonly known as: PRINIVIL,ZESTRIL   TAKE 1 TABLET EVERY DAY      methocarbamol 500 MG tablet   Commonly known as: ROBAXIN   Take 1 tablet (500 mg total) by mouth every 6 (six) hours as needed.      oxyCODONE-acetaminophen 5-325 MG per tablet   Commonly known as: PERCOCET/ROXICET   Take 1-2 tablets by mouth every 4 (four) hours as needed.      polyethylene glycol packet   Commonly known as: MIRALAX / GLYCOLAX   Take 17 g by mouth daily as needed.      predniSONE 5 MG Tabs   Commonly known as: STERAPRED UNI-PAK   Take 5 mg by mouth as directed.      RESTASIS 0.05 % ophthalmic emulsion   Generic drug: cycloSPORINE   Place 1 drop into both eyes 2 (two) times daily.      STALEVO 150 37.5-150-200 MG per tablet   Generic drug: carbidopa-levodopa-entacapone   Take 1 tablet by mouth 5 (five) times daily as needed. As directed by Dr. Misty Stanley        Vitamin D 2000 UNITS Caps   Take 1 capsule by mouth daily.      WOMENS MULTIVITAMIN PLUS PO   Take 1 tablet by mouth daily.          Signed: Patrica Duel 05/23/2012, 10:00 AM

## 2012-05-23 NOTE — Progress Notes (Signed)
Clinical Social Work Department CLINICAL SOCIAL WORK PLACEMENT NOTE 05/23/2012  Patient:  [name], [name]  Account Number:  [id number] Admit date:  05/19/2012  Clinical Social Worker:  Cori Razor, LCSW  Date/time:  05/21/2012 12:26 PM  Clinical Social Work is seeking post-discharge placement for this patient at the following level of care:   SKILLED NURSING   (*CSW will update this form in Epic as items are completed)   05/21/2012  Patient/family provided with [facility] Department of Clinical Social Work's list of facilities offering this level of care within the geographic area requested by the patient (or if unable, by the patient's family).  05/21/2012  Patient/family informed of their freedom to choose among providers that offer the needed level of care, that participate in Medicare, Medicaid or managed care program needed by the patient, have an available bed and are willing to accept the patient.    Patient/family informed of [facility]' ownership interest in [facility], as well as of the fact that they are under no obligation to receive care at this facility.  PASARR submitted to EDS on 05/21/2012 PASARR number received from EDS on 05/21/2012  FL2 transmitted to all facilities in geographic area requested by pt/family on  05/21/2012 FL2 transmitted to all facilities within larger geographic area on   Patient informed that his/her managed care company has contracts with or will negotiate with  certain facilities, including the following:     Patient/family informed of bed offers received:  05/21/2012 Patient chooses bed at [facility] Physician recommends and patient chooses bed at    Patient to be transferred to [facility] on  05/23/2012 Patient to be transferred to facility by [facility]  The following physician request were entered in Epic:   Additional Comments:  Cori Razor LCSW [phone]

## 2012-05-23 NOTE — Progress Notes (Signed)
Pt to d/c to [facility]. Gave report to facility. Pt in no distress, denies pain or discomfort, and is prepared for d/c.

## 2012-05-23 NOTE — Progress Notes (Signed)
Physical Therapy Treatment Patient Details Name: [name] MRN: [id number] DOB: September 30, 1940 Today's Date: 05/23/2012 Time: 1478-2956 PT Time Calculation (min): 25 min  PT Assessment / Plan / Recommendation Comments on Treatment Session  Pt OOB in recliner with spouse visiting.  Pt unable to recall any back percautions so re educated/demonstarted.  Assisted pt out of recliner to [facility], severe posterior lean with x 2 attempts.  75% VC's on proper tech and safety.  HIGH FALL RISK.  Amb limited distance 2nd weakness/tolerance.  Pt plans to D/c to SNF for ST Rehab.    Follow Up Recommendations  SNF     Does the patient have the potential to tolerate intense rehabilitation     Barriers to Discharge        Equipment Recommendations  Rolling walker with 5" wheels    Recommendations for Other Services    Frequency Min 3X/week   Plan Discharge plan remains appropriate    Precautions / Restrictions Precautions Precautions: Back Precaution Comments: Pt unable to verbalize any back percautions so re educated and demonstarted along with given hand out and bookllet to spouse Restrictions Weight Bearing Restrictions: No   Pertinent Vitals/Pain No c/o pain    Mobility  Bed Mobility Bed Mobility: Not assessed Details for Bed Mobility Assistance: pt OOB in recliner Transfers Transfers: Sit to Stand;Stand to Sit Sit to Stand: 1: +2 Total assist;From toilet;From chair/3-in-1 Sit to Stand: Patient Percentage: 50% Details for Transfer Assistance: severe posterior lean and 75% VC's on proper tech, hand placement and increase forward lean. Pt demon decreased safety cognition and poor self correction to mid line.  HIGH FALL RISK. Ambulation/Gait Ambulation/Gait Assistance: 1: +2 Total assist Ambulation/Gait: Patient Percentage: 60% Ambulation Distance (Feet): 26 Feet Assistive device: Rolling walker Ambulation/Gait Assistance Details: 75% VC's on proper walker to self distance and to increaase  posture.  Pt demon decreased safety cognition and poor self correction to midline with delayed reaction.  HIGH FALL RISK. Gait Pattern: Step-to pattern;Step-through pattern;Decreased stride length;Shuffle;Trunk flexed Gait velocity: decreased     PT Goals                                                               progressing    Visit Information  Last PT Received On: 05/23/12 Assistance Needed: +2    Subjective Data      Cognition       Balance     End of Session PT - End of Session Equipment Utilized During Treatment: Gait belt Activity Tolerance: Patient limited by fatigue (limited by weakness) Patient left: in chair;with call bell/phone within reach;with family/visitor present Nurse Communication: Mobility status   Felecia Shelling  PTA [facility]  Acute  Rehab Pager     [phone]

## 2012-05-28 DIAGNOSIS — K5900 Constipation, unspecified: Secondary | ICD-10-CM | POA: Diagnosis not present

## 2012-05-28 DIAGNOSIS — E039 Hypothyroidism, unspecified: Secondary | ICD-10-CM | POA: Diagnosis not present

## 2012-05-28 DIAGNOSIS — M48062 Spinal stenosis, lumbar region with neurogenic claudication: Secondary | ICD-10-CM | POA: Diagnosis not present

## 2012-05-28 DIAGNOSIS — I10 Essential (primary) hypertension: Secondary | ICD-10-CM | POA: Diagnosis not present

## 2012-05-30 DIAGNOSIS — M48062 Spinal stenosis, lumbar region with neurogenic claudication: Secondary | ICD-10-CM | POA: Diagnosis not present

## 2012-05-30 DIAGNOSIS — E039 Hypothyroidism, unspecified: Secondary | ICD-10-CM | POA: Diagnosis not present

## 2012-05-30 DIAGNOSIS — I10 Essential (primary) hypertension: Secondary | ICD-10-CM | POA: Diagnosis not present

## 2012-06-25 DIAGNOSIS — H409 Unspecified glaucoma: Secondary | ICD-10-CM | POA: Diagnosis not present

## 2012-06-25 DIAGNOSIS — R269 Unspecified abnormalities of gait and mobility: Secondary | ICD-10-CM | POA: Diagnosis not present

## 2012-06-25 DIAGNOSIS — F411 Generalized anxiety disorder: Secondary | ICD-10-CM | POA: Diagnosis not present

## 2012-06-25 DIAGNOSIS — M1990 Unspecified osteoarthritis, unspecified site: Secondary | ICD-10-CM | POA: Diagnosis not present

## 2012-06-25 DIAGNOSIS — I10 Essential (primary) hypertension: Secondary | ICD-10-CM | POA: Diagnosis not present

## 2012-06-25 DIAGNOSIS — I739 Peripheral vascular disease, unspecified: Secondary | ICD-10-CM | POA: Diagnosis not present

## 2012-06-25 DIAGNOSIS — M48061 Spinal stenosis, lumbar region without neurogenic claudication: Secondary | ICD-10-CM | POA: Diagnosis not present

## 2012-06-25 DIAGNOSIS — E785 Hyperlipidemia, unspecified: Secondary | ICD-10-CM | POA: Diagnosis not present

## 2012-06-25 DIAGNOSIS — M48062 Spinal stenosis, lumbar region with neurogenic claudication: Secondary | ICD-10-CM | POA: Diagnosis not present

## 2012-06-25 DIAGNOSIS — Z4789 Encounter for other orthopedic aftercare: Secondary | ICD-10-CM | POA: Diagnosis not present

## 2012-06-25 DIAGNOSIS — I872 Venous insufficiency (chronic) (peripheral): Secondary | ICD-10-CM | POA: Diagnosis not present

## 2012-06-25 DIAGNOSIS — Z4889 Encounter for other specified surgical aftercare: Secondary | ICD-10-CM | POA: Diagnosis not present

## 2012-06-25 DIAGNOSIS — M810 Age-related osteoporosis without current pathological fracture: Secondary | ICD-10-CM | POA: Diagnosis not present

## 2012-06-25 DIAGNOSIS — E039 Hypothyroidism, unspecified: Secondary | ICD-10-CM | POA: Diagnosis not present

## 2012-06-25 DIAGNOSIS — G20 Parkinson's disease: Secondary | ICD-10-CM | POA: Diagnosis not present

## 2012-06-27 DIAGNOSIS — M48062 Spinal stenosis, lumbar region with neurogenic claudication: Secondary | ICD-10-CM | POA: Diagnosis not present

## 2012-06-27 DIAGNOSIS — G20 Parkinson's disease: Secondary | ICD-10-CM | POA: Diagnosis not present

## 2012-06-27 DIAGNOSIS — E039 Hypothyroidism, unspecified: Secondary | ICD-10-CM | POA: Diagnosis not present

## 2012-06-27 DIAGNOSIS — I10 Essential (primary) hypertension: Secondary | ICD-10-CM | POA: Diagnosis not present

## 2012-06-30 DIAGNOSIS — G20 Parkinson's disease: Secondary | ICD-10-CM | POA: Diagnosis not present

## 2012-06-30 DIAGNOSIS — Z4789 Encounter for other orthopedic aftercare: Secondary | ICD-10-CM | POA: Diagnosis not present

## 2012-06-30 DIAGNOSIS — I10 Essential (primary) hypertension: Secondary | ICD-10-CM | POA: Diagnosis not present

## 2012-06-30 DIAGNOSIS — M48061 Spinal stenosis, lumbar region without neurogenic claudication: Secondary | ICD-10-CM | POA: Diagnosis not present

## 2012-06-30 DIAGNOSIS — M1990 Unspecified osteoarthritis, unspecified site: Secondary | ICD-10-CM | POA: Diagnosis not present

## 2012-07-02 DIAGNOSIS — M1990 Unspecified osteoarthritis, unspecified site: Secondary | ICD-10-CM | POA: Diagnosis not present

## 2012-07-02 DIAGNOSIS — M48061 Spinal stenosis, lumbar region without neurogenic claudication: Secondary | ICD-10-CM | POA: Diagnosis not present

## 2012-07-02 DIAGNOSIS — I10 Essential (primary) hypertension: Secondary | ICD-10-CM | POA: Diagnosis not present

## 2012-07-02 DIAGNOSIS — Z4789 Encounter for other orthopedic aftercare: Secondary | ICD-10-CM | POA: Diagnosis not present

## 2012-07-02 DIAGNOSIS — G20 Parkinson's disease: Secondary | ICD-10-CM | POA: Diagnosis not present

## 2012-07-03 DIAGNOSIS — M48061 Spinal stenosis, lumbar region without neurogenic claudication: Secondary | ICD-10-CM | POA: Diagnosis not present

## 2012-07-03 DIAGNOSIS — I10 Essential (primary) hypertension: Secondary | ICD-10-CM | POA: Diagnosis not present

## 2012-07-03 DIAGNOSIS — M1990 Unspecified osteoarthritis, unspecified site: Secondary | ICD-10-CM | POA: Diagnosis not present

## 2012-07-03 DIAGNOSIS — Z4789 Encounter for other orthopedic aftercare: Secondary | ICD-10-CM | POA: Diagnosis not present

## 2012-07-03 DIAGNOSIS — G20 Parkinson's disease: Secondary | ICD-10-CM | POA: Diagnosis not present

## 2012-07-04 DIAGNOSIS — M1990 Unspecified osteoarthritis, unspecified site: Secondary | ICD-10-CM | POA: Diagnosis not present

## 2012-07-04 DIAGNOSIS — I10 Essential (primary) hypertension: Secondary | ICD-10-CM | POA: Diagnosis not present

## 2012-07-04 DIAGNOSIS — Z4789 Encounter for other orthopedic aftercare: Secondary | ICD-10-CM | POA: Diagnosis not present

## 2012-07-04 DIAGNOSIS — G20 Parkinson's disease: Secondary | ICD-10-CM | POA: Diagnosis not present

## 2012-07-04 DIAGNOSIS — M48061 Spinal stenosis, lumbar region without neurogenic claudication: Secondary | ICD-10-CM | POA: Diagnosis not present

## 2012-07-07 DIAGNOSIS — G20 Parkinson's disease: Secondary | ICD-10-CM | POA: Diagnosis not present

## 2012-07-08 DIAGNOSIS — G20 Parkinson's disease: Secondary | ICD-10-CM | POA: Diagnosis not present

## 2012-07-08 DIAGNOSIS — M1990 Unspecified osteoarthritis, unspecified site: Secondary | ICD-10-CM | POA: Diagnosis not present

## 2012-07-08 DIAGNOSIS — M48061 Spinal stenosis, lumbar region without neurogenic claudication: Secondary | ICD-10-CM | POA: Diagnosis not present

## 2012-07-08 DIAGNOSIS — Z4789 Encounter for other orthopedic aftercare: Secondary | ICD-10-CM | POA: Diagnosis not present

## 2012-07-08 DIAGNOSIS — I10 Essential (primary) hypertension: Secondary | ICD-10-CM | POA: Diagnosis not present

## 2012-07-09 ENCOUNTER — Ambulatory Visit (INDEPENDENT_AMBULATORY_CARE_PROVIDER_SITE_OTHER): Payer: Medicare Other | Admitting: Pulmonary Disease

## 2012-07-09 ENCOUNTER — Encounter: Payer: Self-pay | Admitting: Pulmonary Disease

## 2012-07-09 VITALS — BP 150/80 | HR 76 | Temp 97.0°F | Ht 60.0 in | Wt 118.8 lb

## 2012-07-09 DIAGNOSIS — I739 Peripheral vascular disease, unspecified: Secondary | ICD-10-CM | POA: Diagnosis not present

## 2012-07-09 DIAGNOSIS — G20 Parkinson's disease: Secondary | ICD-10-CM

## 2012-07-09 DIAGNOSIS — E039 Hypothyroidism, unspecified: Secondary | ICD-10-CM

## 2012-07-09 DIAGNOSIS — E7800 Pure hypercholesterolemia, unspecified: Secondary | ICD-10-CM

## 2012-07-09 DIAGNOSIS — K5730 Diverticulosis of large intestine without perforation or abscess without bleeding: Secondary | ICD-10-CM

## 2012-07-09 DIAGNOSIS — I872 Venous insufficiency (chronic) (peripheral): Secondary | ICD-10-CM

## 2012-07-09 DIAGNOSIS — M545 Low back pain, unspecified: Secondary | ICD-10-CM

## 2012-07-09 DIAGNOSIS — M1990 Unspecified osteoarthritis, unspecified site: Secondary | ICD-10-CM

## 2012-07-09 DIAGNOSIS — R7309 Other abnormal glucose: Secondary | ICD-10-CM

## 2012-07-09 DIAGNOSIS — G20A1 Parkinson's disease without dyskinesia, without mention of fluctuations: Secondary | ICD-10-CM

## 2012-07-09 DIAGNOSIS — F411 Generalized anxiety disorder: Secondary | ICD-10-CM

## 2012-07-09 DIAGNOSIS — M899 Disorder of bone, unspecified: Secondary | ICD-10-CM

## 2012-07-09 DIAGNOSIS — C50919 Malignant neoplasm of unspecified site of unspecified female breast: Secondary | ICD-10-CM

## 2012-07-09 DIAGNOSIS — I10 Essential (primary) hypertension: Secondary | ICD-10-CM | POA: Diagnosis not present

## 2012-07-09 NOTE — Patient Instructions (Addendum)
Today we updated your med list in our EPIC system...    Continue your current medications the same...  We reviewed your hospital records, XRays, EKG, Labs work, etc...  Call for any questions or if we can be of service in any way...  Let's plan a 6 month follow up visit w FASTING blood work at that time.Marland KitchenMarland Kitchen

## 2012-07-09 NOTE — Progress Notes (Signed)
Subjective:    Patient ID: [name], female    DOB: 01-22-1941, 72 y.o.   MRN: [id number]  HPI 72 y/o BF here for a follow up visit... she has mult med problems as noted below...  SEE PREV EPIC NOTES FOR EARLIER DATA >>  ~  Nov 12, 2011:  55mo ROV & [name] has been stable; she had infected seb cyst I&D 4/13 by DrGross & now resolved; we reviewed her prob list, meds, xrays and labs> see below>> CXR 5/13> she forgot to go to [facility] for her yearly f/u film today... LABS 5/13:  FLP- at goals on diet alone;  Chems- wnl & A1c=5.8;  CBC- wnl;  TSH=2.34 on Levo75;  VitD=31 on 2000/d  ~  July 09, 2012:  44mo ROV & [name] is s/p back surg by [facility] 11/13 after which she went to [facility] for rehab 7 now back home w/ home health... We reviewed the following medical problems during today's office visit >>     AbnCXR> f/u film 11/13 showed chr elev right hemidiaph w/ mild basilar atx, normal heart size, NAD; she denies cough, sput, hemoptysis, ch in SOB etc...    HBP> on Lisin10; BP= 150/80 & she denies CP, palpit, but has chr stable DOE, edema, etc; EKG 11/13 showed NSR, rate60, borderline LAD & poor R prog V1=>4...    ASPVDz> on ASA81 + Plavix75; she denies any cerebral ischemic symptoms...    CHOL> on diet alone; last FLP 11/13 showed TChol 145, TG 60, HDL 64, LDL 69    Borderline DM> on diet alone;  Labs 11/13 showed BS= 131, A1c= 6.2    Hypothy> on Synthroid75; last labs 5/13 showed TSH= 2.34    GI- Divertics> on Miralax; there is a +FamHx colon cancer in her father; last colonoscopy 6/12 by DrPatterson showed divertics only...    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    DJD, LBP> she presented 11/13 w/ LBP- s/p decompressive LLam L2-3 for severe sp stenosis 11/13 by DrGioffre & Beane; she has hx prev LLam L3-4 & L4-5; off prev    Percocet & Robaxin; she is still doing  home PT/ OT...    Osteopenia>     Parkinson's dis> on Stalevo & followed by [facility]; he decreased dose from 5/d to 4/d due to hallucinations    Anxiety>  We reviewed prob list, meds, xrays and labs> see below for updates >> she had the 2013 flu vaccine 11/13...         Problem List:  GLAUCOMA (ICD-365.9) - on eye drops daily DrHecker and she reports cataract surg 2010, and Laser Rx 2011> followed for Glaucoma, s/p cats, dry eyes... ~  6/10:  eye exam by DrHecker neg for retinopathy or macular changes... ~  4/11: Argon laser trabeculoplasty planned by DrHecker. ~  She continues yearly check ups w/ DrHecker- no retinopathy...  ABNORMAL CHEST XRAY (ICD-793.1) - she has chr right hemidiaphragm elevation & no changes serially...  ~  CXR 5/09 w/ elev right hemidiaph, no change. ~  CXR 5/11 showed elev right hemidiaph & scarring right base, NAD. ~  CXR 5/12 showed elev right hemidiaph & scarring right base, NAD.Marland Kitchen. ~  CXR 5/13> she forgot to go to [facility] for her film... ~  CXR 11/13 showed chr elev right hemidiaph w/ mild basilar atx, normal heart size, NAD...  HYPERTENSION (ICD-401.9) - on  LISINOPRIL 10mg /d w/ good control...  ~  11/11: BP= 120/82> feeling well and denies HA, fatigue, visual changes, CP, palipit, dizziness, syncope, dyspnea, edema, etc... ~  5/12:  BP= 110/70> remains asymptomatic... ~  5/13:  BP= 110/70 & she is feeling well, asymptomatic... ~  1/14: on Lisin10; BP= 150/80 & she denies CP, palpit, but has chr stable DOE, edema, etc; EKG 11/13 showed NSR, rate60, borderline LAD & poor R prog V1=>4.  PERIPHERAL VASCULAR DISEASE (ICD-443.9) - on ASA 81mg /d & PLAVIX 75mg /d... she is asymptomatic w/o focal weakness, sensory changes, slurring, etc...  VENOUS INSUFFICIENCY (ICD-459.81) - on low sodium diet, etc... denies swelling...  HYPERCHOLESTEROLEMIA (ICD-272.0) - on diet rx alone... ~  FLP 5/08 showed TChol 217, TG 66, HDL 58, LDL 130... ~  FLP 5/09 showed TChol 194, TG 74,  HDL 53, LDL 127... she prefers diet alone... ~  FLP 5/10 showed TChol 218, TG 60, HDL 62, LDL 142... still prefers diet + FishOil rec. ~  FLP 5/11 showed TChol 193, TG 49, HDL 63, LDL 120... improved, continue diet, get wt down. ~  FLP 5/12 showed TChol 162, TG 54, HDL 60, LDL 91 ~  FLP 5/13 on diet alone showed TChol 183, TG 40, HDL 72, LDL 103 ~  on diet alone; last FLP 11/13 showed TChol 145, TG 60, HDL 64, LDL 69  GLUCOSE INTOLERANCE, BORDERLINE (ICD-790.29) - on diet rx alone & doing very well... ~  labs 5/08 showed BS=99, HGA1c=5.9 ~  labs 5/09 showed BS= 90, HgA1c= 5.9 ~  labs 5/10 showed BS= 92, A1c= 5.7 ~  labs 5/11 (wt= 126#) showed BS= 92 ~  Labs 5/12 (wt=130#) showed BS= 75 ~  Labs 5/13 (wt=124#) showed BS= 88, A1c= 5.8 ~  9/13: she had neg eye exam from DrHecker- no retinopathy, no macular edema... ~  1/14: on diet alone;  Labs 11/13 showed BS= 131, A1c= 6.2  HYPOTHYROIDISM (ICD-244.9) - on SYNTHROID 43mcg/d... Hx HYPERTHYROIDISM w/ I-131 rx 9/05...  ~  labs 5/08 on Synthroid 63mcg/d showed TSH= 0.73... keep same. ~  labs 5/09 on Synth88 showed TSH= 0.72 ~  labs 5/10 on Synth88 showed TSH= 0.49 ~  labs 5/11 on Synth88 showed TSH= 0.28... rec> decr dose 34mcg/d (she is concerned about "facial hair") ~  labs 11/11 on Synthroid 35mcg/d showed TSH= 3.92... keep same. ~  Labs 5/12 on Synth75 showed TSH= 1.89 ~  Labs 5/13 on Synth75 showed TSH= 2.34  DIVERTICULOSIS OF COLON (ICD-562.10) - there is a +fam hx of colon cancer in her father... ~  last colonoscopy 8/06 by DrPatterson showed divertics, hems... f/u 41yrs. ~  F/u colonoscopy 6/12 by DrPatterson showed divertics, otherw neg...  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 ~   She continues periodic f/u evals by DrWakefield, DrMoody, & DrKhan==> on observation alone now... 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  DEGENERATIVE JOINT DISEASE (ICD-715.90) & HIP PAIN (ICD-719.45) - she takes calcium & vitamins (including "Nature's Code which has 1,200 u Vit D)... prev on Celebrex & Tramadol but uses OTC meds now as needed... she tells me she had a BMD at  [facility] in 2008 per her GYN. ~  12/10: presents w/ right hip pain> XRays neg; Rx w/ Dosepak, Mobic, rest, heat... ~  11/11:  c/o left hip pain & XRay showed mild degen changes> Rx MOBIC 7.5mg  Prn...  BACK PAIN, LUMBAR (ICD-724.2) - she is s/p lumbar laminectomy L3-4 & L4-5 6/07 by DrGioffre for spinal stenosis... ~  Lower spine degen changes seen on f/u XRays... ~  11/13: she presented w/ severe LBP & leg symptoms w/ severe spinal stenosis L2-3; DrGioffre/Beane did decompressive LLam L2-3 & decompression of nerve roots at L2 & L3 bilaterally w/ foraminotomies...  OSTEOPENIA (ICD-733.90) - she had a normal BMD in 2008- at [facility] w/ TScores +0.5 in spine & +0.6 in left FemNeck... Vit D level was 24 May09 and started on 50,000 u weekly... f/u Vit D level 11/09 = 47 & switched to OTC Vit D supplement. ~  labs 5/11 showed Vit D level = 29... rec incr VIt D to 2000 u daily. ~  She is encouraged to get a follow up BMD when she has her next mammogram & will let us know...  PARKINSON'S DISEASE (ICD-332.0) - she has mod severe Parkinson's dis followed by DrPenumalli & DrStacey at [facility] on STELEVO 150mg - 5 times daily... she has been doing well & very stable on this medication (hx of hallucinations on the 200mg  tabs)... ~  5/11: she requests referral to [facility] for f/u Parkinson's> seen by DrPenumalli & no changes made. ~  11/12:  She reports recent eval by DrPenumali for hallucinations> MRI showed mild atrophy & sm  vessel ischemic dis & they decr her Stavelo from 5 to 4/d...  ANXIETY (ICD-300.00) - prev on Prn Valium but off this medication now...  SEBACEOUS CYST>  She mentioned a "mole" on her back during 5/12 OV & exam revealed a mod sized seb cyst; rec to ask DrWakfield to excise this at his convenience==> removed 9/12.   Past Surgical History  Procedure Date  . Ovarian cyst removal   . Lapraoscopic cholecystectomy 09/2003    Dr. Derrell Lolling  . Lumbar laminectomy and foraminotomies for spinal stenosis 11/2005    Dr. Darrelyn Hillock  . Cataract surgery and argon laser trabeculoplasty 04/2010    Dr. Elmer Picker  . Colonoscopy   . Polypectomy   . Cholecystectomy   . Breast lumpectomy 8/11 by DrWakefield  . Incise and drain abcess 10/04/11    sebaceous cyst on back  . Lumbar laminectomy/decompression microdiscectomy 05/20/2012    Procedure: LUMBAR LAMINECTOMY/DECOMPRESSION MICRODISCECTOMY 2 LEVELS;  Surgeon: Jacki Cones, MD;  Location: [facility];  Service: Orthopedics;  Laterality: N/A;    Outpatient Encounter Prescriptions as of 07/09/2012  Medication Sig Dispense Refill  . aspirin 81 MG tablet Take 81 mg by mouth daily.        . Calcium Carbonate-Vitamin D (CALTRATE 600+D) 600-400 MG-UNIT per tablet Take 1 tablet by mouth 2 (two) times daily.        . carbidopa-levodopa-entacapone (STALEVO 150) 37.5-150-200 MG per tablet Take 1 tablet by mouth 5 (five) times daily as needed. As directed by Dr. Vaughan Basta      . Cholecalciferol (VITAMIN D) 2000 UNITS CAPS Take 1 capsule by mouth daily.        . clopidogrel (PLAVIX) 75 MG tablet TAKE 1 TABLET EVERY DAY  60 tablet  5  . cycloSPORINE (RESTASIS) 0.05 % ophthalmic emulsion Place 1 drop into both eyes 2 (two) times daily.        Marland Kitchen levothyroxine (SYNTHROID,  LEVOTHROID) 75 MCG tablet TAKE 1 TABLET EVERY DAY  60 tablet  5  . lisinopril (PRINIVIL,ZESTRIL) 10 MG tablet TAKE 1 TABLET EVERY DAY  60 tablet  5  . Multiple Vitamins-Minerals (WOMENS MULTIVITAMIN PLUS PO) Take 1  tablet by mouth daily.        . polyethylene glycol (MIRALAX / GLYCOLAX) packet Take 17 g by mouth daily as needed.  14 each  0  . methocarbamol (ROBAXIN) 500 MG tablet Take 1 tablet (500 mg total) by mouth every 6 (six) hours as needed.  40 tablet  1  . oxyCODONE-acetaminophen (PERCOCET/ROXICET) 5-325 MG per tablet Take 1-2 tablets by mouth every 4 (four) hours as needed.  60 tablet  0    No Known Allergies   Current Medications, Allergies, Past Medical History, Past Surgical History, Family History, and Social History were reviewed in [employer] record.   Review of Systems        See HPI - all other systems neg except as noted... The patient complains of dyspnea on exertion, muscle weakness, and difficulty walking.  The patient denies anorexia, fever, weight loss, weight gain, vision loss, decreased hearing, hoarseness, chest pain, syncope, peripheral edema, prolonged cough, headaches, hemoptysis, abdominal pain, melena, hematochezia, severe indigestion/heartburn, hematuria, incontinence, suspicious skin lesions, transient blindness, depression, unusual weight change, abnormal bleeding, enlarged lymph nodes, and angioedema.     Objective:   Physical Exam    WD, WN, 71 y/o BF in NAD.Marland KitchenMarland Kitchen slow moving but only min tremor from Parkinsons... GENERAL:  Alert & oriented; pleasant & cooperative... HEENT:  [place]/AT, EOM-wnl, PERRLA, EACs-clear, TMs-wnl, NOSE-clear, THROAT-clear & wnl. NECK:  Supple w/ fairROM; no JVD; normal carotid impulses w/o bruits; no thyromegaly or nodules palpated; no lymphadenopathy. CHEST:  Clear to P & A; without wheezes/ rales/ or rhonchi. HEART:  Regular Rhythm; without murmurs/ rubs/ or gallops. ABDOMEN:  Soft & nontender; normal bowel sounds; no organomegaly or masses detected. EXT: without deformities, mod arthritic changes; no varicose veins/ +venous insuffic/ no edema. fairly good ROM in hips etc... NEURO:  CN's intact; min tremor and  bradykinesia of Parkinson's; no focal neuro deficits... DERM:  No lesions noted; no rash etc...  RADIOLOGY DATA:  Reviewed in the EPIC EMR & discussed w/ the patient...  LABORATORY DATA:  Reviewed in the EPIC EMR & discussed w/ the patient...   Assessment & Plan:    HBP>  Stable on Lisinopril, tolerating well, continue same...  ASPVD>  Stable on ASA/ Plavix & denies cerebral ischemic symptoms, she does not want to change meds...  CHOL>  Her lipids are good on diet alone; continue diet efforts...  DM>  Borderline at worst, her BS & A1c has been normal x yrs...  Hypothyroidism>  Clinically stable on 75mcg/d...  GI>  Follow up colon was neg & she is stable...  BREAST CANCER>  She had left breast DCIS excised 8/11 w/ post op XRT as noted; refused Tamoxifen, followed by Andi Hence...  DJD, LBP>  She is s/p LLam- tells me she still managed quite satis on OTC analgesics...  Osteopenia>  Vit D level is 31 & she is rec totake 2000u extra every day; we will sched BMD here...  Parkinsons>  She is stable w/ mod severe dis on the Stalevo Rx; but this has beed weaned from 5/d to 4/d due to hallucinations by DrPenumali...   Patient's Medications  New Prescriptions   No medications on file  Previous Medications   ASPIRIN 81 MG TABLET  Take 81 mg by mouth daily.     CALCIUM CARBONATE-VITAMIN D (CALTRATE 600+D) 600-400 MG-UNIT PER TABLET    Take 1 tablet by mouth 2 (two) times daily.     CARBIDOPA-LEVODOPA-ENTACAPONE (STALEVO 150) 37.5-150-200 MG PER TABLET    Take 1 tablet by mouth 5 (five) times daily as needed. As directed by Dr. Vaughan Basta   CHOLECALCIFEROL (VITAMIN D) 2000 UNITS CAPS    Take 1 capsule by mouth daily.     CLOPIDOGREL (PLAVIX) 75 MG TABLET    TAKE 1 TABLET EVERY DAY   CYCLOSPORINE (RESTASIS) 0.05 % OPHTHALMIC EMULSION    Place 1 drop into both eyes 2 (two) times daily.     LEVOTHYROXINE (SYNTHROID, LEVOTHROID) 75 MCG TABLET    TAKE 1 TABLET EVERY DAY   LISINOPRIL  (PRINIVIL,ZESTRIL) 10 MG TABLET    TAKE 1 TABLET EVERY DAY   MULTIPLE VITAMINS-MINERALS (WOMENS MULTIVITAMIN PLUS PO)    Take 1 tablet by mouth daily.     POLYETHYLENE GLYCOL (MIRALAX / GLYCOLAX) PACKET    Take 17 g by mouth daily as needed.  Modified Medications   No medications on file  Discontinued Medications   METHOCARBAMOL (ROBAXIN) 500 MG TABLET    Take 1 tablet (500 mg total) by mouth every 6 (six) hours as needed.   OXYCODONE-ACETAMINOPHEN (PERCOCET/ROXICET) 5-325 MG PER TABLET    Take 1-2 tablets by mouth every 4 (four) hours as needed.

## 2012-07-10 ENCOUNTER — Encounter: Payer: Self-pay | Admitting: Pulmonary Disease

## 2012-07-10 DIAGNOSIS — M1990 Unspecified osteoarthritis, unspecified site: Secondary | ICD-10-CM | POA: Diagnosis not present

## 2012-07-10 DIAGNOSIS — I10 Essential (primary) hypertension: Secondary | ICD-10-CM | POA: Diagnosis not present

## 2012-07-10 DIAGNOSIS — M48061 Spinal stenosis, lumbar region without neurogenic claudication: Secondary | ICD-10-CM | POA: Diagnosis not present

## 2012-07-10 DIAGNOSIS — G20 Parkinson's disease: Secondary | ICD-10-CM | POA: Diagnosis not present

## 2012-07-10 DIAGNOSIS — Z4789 Encounter for other orthopedic aftercare: Secondary | ICD-10-CM | POA: Diagnosis not present

## 2012-07-11 DIAGNOSIS — G20 Parkinson's disease: Secondary | ICD-10-CM | POA: Diagnosis not present

## 2012-07-11 DIAGNOSIS — Z4789 Encounter for other orthopedic aftercare: Secondary | ICD-10-CM | POA: Diagnosis not present

## 2012-07-11 DIAGNOSIS — M48061 Spinal stenosis, lumbar region without neurogenic claudication: Secondary | ICD-10-CM | POA: Diagnosis not present

## 2012-07-11 DIAGNOSIS — I10 Essential (primary) hypertension: Secondary | ICD-10-CM | POA: Diagnosis not present

## 2012-07-11 DIAGNOSIS — M1990 Unspecified osteoarthritis, unspecified site: Secondary | ICD-10-CM | POA: Diagnosis not present

## 2012-07-14 DIAGNOSIS — M1990 Unspecified osteoarthritis, unspecified site: Secondary | ICD-10-CM | POA: Diagnosis not present

## 2012-07-14 DIAGNOSIS — I10 Essential (primary) hypertension: Secondary | ICD-10-CM | POA: Diagnosis not present

## 2012-07-14 DIAGNOSIS — M48061 Spinal stenosis, lumbar region without neurogenic claudication: Secondary | ICD-10-CM | POA: Diagnosis not present

## 2012-07-14 DIAGNOSIS — G20 Parkinson's disease: Secondary | ICD-10-CM | POA: Diagnosis not present

## 2012-07-14 DIAGNOSIS — Z4789 Encounter for other orthopedic aftercare: Secondary | ICD-10-CM | POA: Diagnosis not present

## 2012-07-15 DIAGNOSIS — I10 Essential (primary) hypertension: Secondary | ICD-10-CM | POA: Diagnosis not present

## 2012-07-15 DIAGNOSIS — G20 Parkinson's disease: Secondary | ICD-10-CM | POA: Diagnosis not present

## 2012-07-15 DIAGNOSIS — M48061 Spinal stenosis, lumbar region without neurogenic claudication: Secondary | ICD-10-CM | POA: Diagnosis not present

## 2012-07-15 DIAGNOSIS — M1990 Unspecified osteoarthritis, unspecified site: Secondary | ICD-10-CM | POA: Diagnosis not present

## 2012-07-15 DIAGNOSIS — Z4789 Encounter for other orthopedic aftercare: Secondary | ICD-10-CM | POA: Diagnosis not present

## 2012-07-17 DIAGNOSIS — M1990 Unspecified osteoarthritis, unspecified site: Secondary | ICD-10-CM | POA: Diagnosis not present

## 2012-07-17 DIAGNOSIS — G20 Parkinson's disease: Secondary | ICD-10-CM | POA: Diagnosis not present

## 2012-07-17 DIAGNOSIS — Z4789 Encounter for other orthopedic aftercare: Secondary | ICD-10-CM | POA: Diagnosis not present

## 2012-07-17 DIAGNOSIS — M48061 Spinal stenosis, lumbar region without neurogenic claudication: Secondary | ICD-10-CM | POA: Diagnosis not present

## 2012-07-17 DIAGNOSIS — I10 Essential (primary) hypertension: Secondary | ICD-10-CM | POA: Diagnosis not present

## 2012-07-21 DIAGNOSIS — G20 Parkinson's disease: Secondary | ICD-10-CM | POA: Diagnosis not present

## 2012-07-21 DIAGNOSIS — I10 Essential (primary) hypertension: Secondary | ICD-10-CM | POA: Diagnosis not present

## 2012-07-21 DIAGNOSIS — M48061 Spinal stenosis, lumbar region without neurogenic claudication: Secondary | ICD-10-CM | POA: Diagnosis not present

## 2012-07-21 DIAGNOSIS — M1990 Unspecified osteoarthritis, unspecified site: Secondary | ICD-10-CM | POA: Diagnosis not present

## 2012-07-21 DIAGNOSIS — Z4789 Encounter for other orthopedic aftercare: Secondary | ICD-10-CM | POA: Diagnosis not present

## 2012-07-22 DIAGNOSIS — Z4789 Encounter for other orthopedic aftercare: Secondary | ICD-10-CM | POA: Diagnosis not present

## 2012-07-22 DIAGNOSIS — M48061 Spinal stenosis, lumbar region without neurogenic claudication: Secondary | ICD-10-CM | POA: Diagnosis not present

## 2012-07-22 DIAGNOSIS — M1990 Unspecified osteoarthritis, unspecified site: Secondary | ICD-10-CM | POA: Diagnosis not present

## 2012-07-22 DIAGNOSIS — I10 Essential (primary) hypertension: Secondary | ICD-10-CM | POA: Diagnosis not present

## 2012-07-22 DIAGNOSIS — G20 Parkinson's disease: Secondary | ICD-10-CM | POA: Diagnosis not present

## 2012-07-23 DIAGNOSIS — I10 Essential (primary) hypertension: Secondary | ICD-10-CM | POA: Diagnosis not present

## 2012-07-23 DIAGNOSIS — M48061 Spinal stenosis, lumbar region without neurogenic claudication: Secondary | ICD-10-CM | POA: Diagnosis not present

## 2012-07-23 DIAGNOSIS — G20 Parkinson's disease: Secondary | ICD-10-CM | POA: Diagnosis not present

## 2012-07-23 DIAGNOSIS — Z4789 Encounter for other orthopedic aftercare: Secondary | ICD-10-CM | POA: Diagnosis not present

## 2012-07-23 DIAGNOSIS — M1990 Unspecified osteoarthritis, unspecified site: Secondary | ICD-10-CM | POA: Diagnosis not present

## 2012-07-24 DIAGNOSIS — I10 Essential (primary) hypertension: Secondary | ICD-10-CM | POA: Diagnosis not present

## 2012-07-24 DIAGNOSIS — G20 Parkinson's disease: Secondary | ICD-10-CM | POA: Diagnosis not present

## 2012-07-24 DIAGNOSIS — M48061 Spinal stenosis, lumbar region without neurogenic claudication: Secondary | ICD-10-CM | POA: Diagnosis not present

## 2012-07-24 DIAGNOSIS — Z4789 Encounter for other orthopedic aftercare: Secondary | ICD-10-CM | POA: Diagnosis not present

## 2012-07-24 DIAGNOSIS — M1990 Unspecified osteoarthritis, unspecified site: Secondary | ICD-10-CM | POA: Diagnosis not present

## 2012-07-28 DIAGNOSIS — Z4789 Encounter for other orthopedic aftercare: Secondary | ICD-10-CM | POA: Diagnosis not present

## 2012-07-28 DIAGNOSIS — M1990 Unspecified osteoarthritis, unspecified site: Secondary | ICD-10-CM | POA: Diagnosis not present

## 2012-07-28 DIAGNOSIS — G20 Parkinson's disease: Secondary | ICD-10-CM | POA: Diagnosis not present

## 2012-07-28 DIAGNOSIS — I10 Essential (primary) hypertension: Secondary | ICD-10-CM | POA: Diagnosis not present

## 2012-07-28 DIAGNOSIS — M48061 Spinal stenosis, lumbar region without neurogenic claudication: Secondary | ICD-10-CM | POA: Diagnosis not present

## 2012-07-29 DIAGNOSIS — M48061 Spinal stenosis, lumbar region without neurogenic claudication: Secondary | ICD-10-CM | POA: Diagnosis not present

## 2012-07-29 DIAGNOSIS — M1990 Unspecified osteoarthritis, unspecified site: Secondary | ICD-10-CM | POA: Diagnosis not present

## 2012-07-29 DIAGNOSIS — G20 Parkinson's disease: Secondary | ICD-10-CM | POA: Diagnosis not present

## 2012-07-29 DIAGNOSIS — I10 Essential (primary) hypertension: Secondary | ICD-10-CM | POA: Diagnosis not present

## 2012-07-29 DIAGNOSIS — Z4789 Encounter for other orthopedic aftercare: Secondary | ICD-10-CM | POA: Diagnosis not present

## 2012-07-30 DIAGNOSIS — M25559 Pain in unspecified hip: Secondary | ICD-10-CM | POA: Diagnosis not present

## 2012-07-31 DIAGNOSIS — M48061 Spinal stenosis, lumbar region without neurogenic claudication: Secondary | ICD-10-CM | POA: Diagnosis not present

## 2012-07-31 DIAGNOSIS — G20 Parkinson's disease: Secondary | ICD-10-CM | POA: Diagnosis not present

## 2012-07-31 DIAGNOSIS — I10 Essential (primary) hypertension: Secondary | ICD-10-CM | POA: Diagnosis not present

## 2012-07-31 DIAGNOSIS — M1990 Unspecified osteoarthritis, unspecified site: Secondary | ICD-10-CM | POA: Diagnosis not present

## 2012-07-31 DIAGNOSIS — Z4789 Encounter for other orthopedic aftercare: Secondary | ICD-10-CM | POA: Diagnosis not present

## 2012-08-01 DIAGNOSIS — Z4789 Encounter for other orthopedic aftercare: Secondary | ICD-10-CM | POA: Diagnosis not present

## 2012-08-01 DIAGNOSIS — M1990 Unspecified osteoarthritis, unspecified site: Secondary | ICD-10-CM | POA: Diagnosis not present

## 2012-08-01 DIAGNOSIS — I10 Essential (primary) hypertension: Secondary | ICD-10-CM | POA: Diagnosis not present

## 2012-08-01 DIAGNOSIS — M48061 Spinal stenosis, lumbar region without neurogenic claudication: Secondary | ICD-10-CM | POA: Diagnosis not present

## 2012-08-01 DIAGNOSIS — G20 Parkinson's disease: Secondary | ICD-10-CM | POA: Diagnosis not present

## 2012-08-04 ENCOUNTER — Telehealth: Payer: Self-pay | Admitting: Pulmonary Disease

## 2012-08-04 MED ORDER — LEVOTHYROXINE SODIUM 75 MCG PO TABS: 75.0000 ug | ORAL_TABLET | Freq: Every day | ORAL | Status: DC

## 2012-08-04 NOTE — Telephone Encounter (Signed)
Rx has been sent in, pt is aware. 

## 2012-08-12 DIAGNOSIS — M25559 Pain in unspecified hip: Secondary | ICD-10-CM | POA: Diagnosis not present

## 2012-08-12 DIAGNOSIS — Z4789 Encounter for other orthopedic aftercare: Secondary | ICD-10-CM | POA: Diagnosis not present

## 2012-08-27 ENCOUNTER — Other Ambulatory Visit: Payer: Self-pay | Admitting: Orthopedic Surgery

## 2012-08-27 DIAGNOSIS — M25552 Pain in left hip: Secondary | ICD-10-CM

## 2012-08-29 ENCOUNTER — Other Ambulatory Visit: Payer: Medicare Other

## 2012-09-02 ENCOUNTER — Other Ambulatory Visit: Payer: Medicare Other

## 2012-09-02 ENCOUNTER — Ambulatory Visit
Admission: RE | Admit: 2012-09-02 | Discharge: 2012-09-02 | Disposition: A | Discharge status: Home / Self Care | Payer: Medicare Other | Source: Ambulatory Visit | Attending: Orthopedic Surgery | Admitting: Orthopedic Surgery

## 2012-09-02 DIAGNOSIS — M25552 Pain in left hip: Secondary | ICD-10-CM

## 2012-09-02 DIAGNOSIS — M169 Osteoarthritis of hip, unspecified: Secondary | ICD-10-CM | POA: Diagnosis not present

## 2012-09-05 ENCOUNTER — Other Ambulatory Visit: Payer: Self-pay | Admitting: Diagnostic Neuroimaging

## 2012-09-05 DIAGNOSIS — G20 Parkinson's disease: Secondary | ICD-10-CM

## 2012-09-05 DIAGNOSIS — R6889 Other general symptoms and signs: Secondary | ICD-10-CM | POA: Diagnosis not present

## 2012-09-05 DIAGNOSIS — G20A1 Parkinson's disease without dyskinesia, without mention of fluctuations: Secondary | ICD-10-CM | POA: Diagnosis not present

## 2012-09-08 DIAGNOSIS — F29 Unspecified psychosis not due to a substance or known physiological condition: Secondary | ICD-10-CM | POA: Diagnosis not present

## 2012-09-08 DIAGNOSIS — G20 Parkinson's disease: Secondary | ICD-10-CM | POA: Diagnosis not present

## 2012-09-08 DIAGNOSIS — R300 Dysuria: Secondary | ICD-10-CM | POA: Diagnosis not present

## 2012-09-09 ENCOUNTER — Ambulatory Visit
Admission: RE | Admit: 2012-09-09 | Discharge: 2012-09-09 | Disposition: A | Discharge status: Home / Self Care | Payer: Medicare Other | Source: Ambulatory Visit | Attending: Diagnostic Neuroimaging | Admitting: Diagnostic Neuroimaging

## 2012-09-09 ENCOUNTER — Other Ambulatory Visit: Payer: Medicare Other

## 2012-09-09 DIAGNOSIS — G20 Parkinson's disease: Secondary | ICD-10-CM

## 2012-09-10 ENCOUNTER — Other Ambulatory Visit: Payer: Medicare Other

## 2012-09-11 DIAGNOSIS — Z4789 Encounter for other orthopedic aftercare: Secondary | ICD-10-CM | POA: Diagnosis not present

## 2012-09-18 ENCOUNTER — Other Ambulatory Visit: Payer: Self-pay | Admitting: Diagnostic Neuroimaging

## 2012-09-18 ENCOUNTER — Telehealth: Payer: Self-pay | Admitting: Diagnostic Neuroimaging

## 2012-09-18 DIAGNOSIS — R6889 Other general symptoms and signs: Secondary | ICD-10-CM | POA: Diagnosis not present

## 2012-09-18 DIAGNOSIS — R899 Unspecified abnormal finding in specimens from other organs, systems and tissues: Secondary | ICD-10-CM

## 2012-09-18 NOTE — Telephone Encounter (Signed)
Called pt, spoke to spouse, informed of lab reorder due to incorrect processing. Spouse said will be in today.

## 2012-09-19 DIAGNOSIS — H4011X0 Primary open-angle glaucoma, stage unspecified: Secondary | ICD-10-CM | POA: Diagnosis not present

## 2012-09-19 DIAGNOSIS — H04129 Dry eye syndrome of unspecified lacrimal gland: Secondary | ICD-10-CM | POA: Diagnosis not present

## 2012-09-19 DIAGNOSIS — H409 Unspecified glaucoma: Secondary | ICD-10-CM | POA: Diagnosis not present

## 2012-09-20 LAB — URINALYSIS
Ketones, UA: NEGATIVE
Leukocytes, UA: NEGATIVE
Nitrite, UA: NEGATIVE
Specific Gravity, UA: 1.018 (ref 1.005–1.030)

## 2012-09-20 LAB — URINE CULTURE: Organism ID, Bacteria: NO GROWTH

## 2012-09-22 DIAGNOSIS — N949 Unspecified condition associated with female genital organs and menstrual cycle: Secondary | ICD-10-CM | POA: Diagnosis not present

## 2012-09-22 DIAGNOSIS — R109 Unspecified abdominal pain: Secondary | ICD-10-CM | POA: Diagnosis not present

## 2012-09-22 DIAGNOSIS — Z853 Personal history of malignant neoplasm of breast: Secondary | ICD-10-CM | POA: Diagnosis not present

## 2012-09-22 DIAGNOSIS — D259 Leiomyoma of uterus, unspecified: Secondary | ICD-10-CM | POA: Diagnosis not present

## 2012-09-23 ENCOUNTER — Telehealth: Payer: Self-pay | Admitting: Diagnostic Neuroimaging

## 2012-09-23 NOTE — Telephone Encounter (Signed)
Spoke to pt informed, sched f/u visit

## 2012-09-23 NOTE — Telephone Encounter (Signed)
Message copied by Ellin Goodie on Tue Sep 23, 2012 11:04 AM ------      Message from: Joycelyn Schmid      Created: Mon Sep 22, 2012  1:42 PM       Normal labs. Call patient with results. -VRP ------

## 2012-10-02 ENCOUNTER — Ambulatory Visit: Payer: Self-pay | Admitting: Diagnostic Neuroimaging

## 2012-10-07 DIAGNOSIS — N949 Unspecified condition associated with female genital organs and menstrual cycle: Secondary | ICD-10-CM | POA: Diagnosis not present

## 2012-10-07 DIAGNOSIS — D259 Leiomyoma of uterus, unspecified: Secondary | ICD-10-CM | POA: Diagnosis not present

## 2012-10-08 ENCOUNTER — Other Ambulatory Visit: Payer: Self-pay | Admitting: Obstetrics and Gynecology

## 2012-10-08 DIAGNOSIS — D259 Leiomyoma of uterus, unspecified: Secondary | ICD-10-CM

## 2012-10-13 ENCOUNTER — Other Ambulatory Visit: Payer: Medicare Other

## 2012-10-20 ENCOUNTER — Telehealth: Payer: Self-pay

## 2012-10-20 ENCOUNTER — Other Ambulatory Visit: Payer: Medicare Other

## 2012-10-20 NOTE — Telephone Encounter (Signed)
Spoke to spse. Resched appt.

## 2012-10-21 ENCOUNTER — Ambulatory Visit
Admission: RE | Admit: 2012-10-21 | Discharge: 2012-10-21 | Disposition: A | Discharge status: Home / Self Care | Payer: Medicare Other | Source: Ambulatory Visit | Attending: Obstetrics and Gynecology | Admitting: Obstetrics and Gynecology

## 2012-10-21 DIAGNOSIS — D259 Leiomyoma of uterus, unspecified: Secondary | ICD-10-CM

## 2012-10-21 DIAGNOSIS — K4090 Unilateral inguinal hernia, without obstruction or gangrene, not specified as recurrent: Secondary | ICD-10-CM | POA: Diagnosis not present

## 2012-10-29 DIAGNOSIS — N949 Unspecified condition associated with female genital organs and menstrual cycle: Secondary | ICD-10-CM | POA: Diagnosis not present

## 2012-10-30 ENCOUNTER — Ambulatory Visit (INDEPENDENT_AMBULATORY_CARE_PROVIDER_SITE_OTHER): Payer: Medicare Other | Admitting: Diagnostic Neuroimaging

## 2012-10-30 ENCOUNTER — Encounter: Payer: Self-pay | Admitting: Diagnostic Neuroimaging

## 2012-10-30 VITALS — BP 97/62 | HR 71 | Temp 99.0°F | Ht 60.0 in | Wt 115.0 lb

## 2012-10-30 DIAGNOSIS — F0390 Unspecified dementia without behavioral disturbance: Secondary | ICD-10-CM

## 2012-10-30 DIAGNOSIS — R443 Hallucinations, unspecified: Secondary | ICD-10-CM | POA: Diagnosis not present

## 2012-10-30 DIAGNOSIS — G20 Parkinson's disease: Secondary | ICD-10-CM | POA: Diagnosis not present

## 2012-10-30 MED ORDER — CARBIDOPA-LEVODOPA-ENTACAPONE 25-100-200 MG PO TABS: 1.0000 | ORAL_TABLET | Freq: Four times a day (QID) | ORAL | Status: DC

## 2012-10-30 NOTE — Progress Notes (Addendum)
[facility]  PATIENT: [name] DOB: [birth year]  REFERRING CLINICIAN:  HISTORY FROM: patient and husband REASON FOR VISIT: follow up   HISTORICAL  CHIEF COMPLAINT:  Chief Complaint  Patient presents with  . Follow-up    Memory Loss    HISTORY OF PRESENT ILLNESS:   UPDATE 10/30/12: Since last visit patient is stable. Reduced dosing of Stalevo seems to have improved her hallucinations, but her handwriting has worsened. She still sees children and people in her home, but according to the husband this is much less frequent. He has had to adjust some of the objects in his home which were bothering her. For example plants or water heaters high up in the room, or radiators on the floor, seem to bother her more. Once he removed these from her view she felt better.  Tremor, gait and balance are stable. No falls. She does have more stooped posture than before but this seems to be stable since her back surgery. She is having some or drooling. She's having more runny nose. She keeps a napkin with her to wipe her nose.  Patient's husband reports more short-term memory loss than before.  UPDATE 09/05/12: Since last visit, has reduced stalevo to QID. no change in hallucinations or parkinson's symptoms. until 1 month ago, some progression of confusion, memory, hallucinations and paranoia. this fluctuates throughout the day.  UPDATE 07/07/12: Since last visit, tried seroquel x 2 weeks, but hallucinations worsened. Now off seroquel. Also had low back surgery (nov 2013), and went to rehab. Now back home. Stalevo dosing stable.  UPDATE 04/16/12: Never picked up seroquel after last visit. she didn't know that i rx'd this, even though we discussed it. will try rx again. hallucinations are stable. some more stiffness in hips lately. on 5 tabs stalevo per day.  UPDATE 08/14/11:  Continues to have hallucinations, mostly at night, denies being afraid but feels they are bothersome.  Has  not thought about calling 911.  Realizes they are hallucinations.  Took Stalevo 4 times per day for one week without improvement of hallucinations, no change in parkinson's symptoms nowback up to 5 times a day.  She is interested in trying other medications for relief.  Also verbalizes concerns of choking episodes in her sleep.  Occurs approximately 3-4 times per month.  Denies choking episodes with eating or drinking.  PRIOR HPI: 72 year old female with history of hypothyroidism, hypertension, peripheral vascular disease presenting for evaluation and management of Parkinson's disease.  She is accompanied by her husband.  In 2004 patient began to develop tremor in her bilateral upper extremities. She was diagnosed with Parkinson's disease. She was initially treated with Mirapex and Sinemet by Dr. Orlin Hilding ([facility]).  She began to develop hallucinations and was ultimately changed to Stalevo by Dr. [facility]).  She has done fairly well on this medication. She takes Stalevo 37.5/150/200, 1 tab five times a day (7am, 11am, 3pm, 7pm, bedtime).  She notices wearing off around 4 hours after her dose. She has not had any falls. She denies hallucinations or constipation.  She would like to re-establish care with a local neurologist.   REVIEW OF SYSTEMS: Full 14 system review of systems performed and notable only for memory loss decreased energy hallucinations.  ALLERGIES: No Known Allergies  HOME MEDICATIONS: Outpatient Prescriptions Prior to Visit  Medication Sig Dispense Refill  . aspirin 81 MG tablet Take 81 mg by mouth daily.        . Calcium Carbonate-Vitamin D (CALTRATE  600+D) 600-400 MG-UNIT per tablet Take 1 tablet by mouth 2 (two) times daily.        . carbidopa-levodopa-entacapone (STALEVO 150) 37.5-150-200 MG per tablet Take 1 tablet by mouth 4 (four) times daily as needed. As directed by Dr. Vaughan Basta      . Cholecalciferol (VITAMIN D) 2000 UNITS CAPS Take 1 capsule by mouth daily.          . clopidogrel (PLAVIX) 75 MG tablet TAKE 1 TABLET EVERY DAY  60 tablet  5  . cycloSPORINE (RESTASIS) 0.05 % ophthalmic emulsion Place 1 drop into both eyes 2 (two) times daily.        Marland Kitchen levothyroxine (SYNTHROID, LEVOTHROID) 75 MCG tablet Take 1 tablet (75 mcg total) by mouth daily.  60 tablet  5  . lisinopril (PRINIVIL,ZESTRIL) 10 MG tablet TAKE 1 TABLET EVERY DAY  60 tablet  5  . Multiple Vitamins-Minerals (WOMENS MULTIVITAMIN PLUS PO) Take 1 tablet by mouth daily.        . polyethylene glycol (MIRALAX / GLYCOLAX) packet Take 17 g by mouth daily as needed.  14 each  0   No facility-administered medications prior to visit.    PAST MEDICAL HISTORY: Past Medical History  Diagnosis Date  . Glaucoma(365)   . Abnormal chest x-ray   . Hypertension   . Peripheral vascular disease   . Venous insufficiency   . Hypercholesteremia   . Hypothyroid   . Diverticulosis of colon   . DJD (degenerative joint disease)   . Hip pain   . Lumbar back pain   . Osteopenia   . Vitamin D deficiency   . Parkinson disease   . Anxiety   . Breast cancer     stage 0 left    PAST SURGICAL HISTORY: Past Surgical History  Procedure Laterality Date  . Ovarian cyst removal    . Lapraoscopic cholecystectomy  09/2003    Dr. Derrell Lolling  . Lumbar laminectomy and foraminotomies for spinal stenosis  11/2005    Dr. Darrelyn Hillock  . Cataract surgery and argon laser trabeculoplasty  04/2010    Dr. Elmer Picker  . Colonoscopy    . Polypectomy    . Cholecystectomy    . Breast lumpectomy  8/11 by DrWakefield  . Incise and drain abcess  10/04/11    sebaceous cyst on back  . Lumbar laminectomy/decompression microdiscectomy  05/20/2012    Procedure: LUMBAR LAMINECTOMY/DECOMPRESSION MICRODISCECTOMY 2 LEVELS;  Surgeon: Jacki Cones, MD;  Location: [facility];  Service: Orthopedics;  Laterality: N/A;    FAMILY HISTORY: Family History  Problem Relation Age of Onset  . Colon cancer Father     SOCIAL HISTORY:  History   Social  History  . Marital Status: Married    Spouse Name: Verdon Cummins    Number of Children: 2  . Years of Education: Assoc   Occupational History  . retired   .     Social History Main Topics  . Smoking status: Never Smoker   . Smokeless tobacco: Never Used  . Alcohol Use: No  . Drug Use: No  . Sexually Active: Not Currently   Other Topics Concern  . Not on file   Social History Narrative   Pt lives at home with spouse.   Caffeine Use: Very little; once a week     PHYSICAL EXAM  Filed Vitals:   10/30/12 1311  BP: 97/62  Pulse: 71  Temp: 99 F (37.2 C)  TempSrc: Oral  Height: 5' (1.524 m)  Weight:  115 lb (52.164 kg)   Body mass index is 22.46 kg/(m^2).  EXAM: General: Patient is awake, alert and in no acute distress.  Well developed and groomed. Neck: Neck is supple. Cardiovascular: No carotid artery bruits.  Heart is regular rate and rhythm with no murmurs.  Neurologic Exam  Mental Status: Awake, alert.  Language is fluent and comprehension intact. MMSE 21/30. AFT 4. GDS 2. Cranial Nerves: Pupils are equal and reactive to light.  Visual fields are full to confrontation.  Conjugate eye movements are full and symmetric.  Facial sensation and strength are symmetric.  Hearing is intact.  Palate elevated symmetrically and uvula is midline.  Shoulder shug is symmetric.  Tongue is midline.  MASKED FACIES.  POSITIVE MYERSON'S SIGN. Motor: ROCKING MOTION OF HEAD AND NECK, FOREWARD AND BACKWARDS. Normal bulk and MILD COGWHEELING IN BUE. BRADYKINESIA IN BUE AND BLE (LEFT WORSE THAN RIGHT). Full strength in the upper and lower extremities.  No pronator drift.  NO TREMOR. Sensory: Intact and symmetric to light touch. Coordination: No ataxia or dysmetria on finger-nose or rapid alternating movement testing.  SLOW RAM. Gait and Station: Narrow based gait; SLIGHT STOOPED POSTURE. SLOW. SHORT STEPS. Reflexes: Deep tendon reflexes in the upper and lower extremity are TRACE and  symmetric.   DIAGNOSTIC DATA (LABS, IMAGING, TESTING) - I reviewed patient records, labs, notes, testing and imaging myself where available.  Lab Results  Component Value Date   WBC 10.8* 05/21/2012   HGB 13.1 05/21/2012   HCT 38.6 05/21/2012   MCV 92.6 05/21/2012   PLT 207 05/21/2012      Component Value Date/Time   NA 139 05/19/2012 2000   K 4.1 05/19/2012 2000   CL 107 05/19/2012 2000   CO2 25 05/19/2012 2000   GLUCOSE 167* 05/19/2012 2000   BUN 17 05/19/2012 2000   CREATININE 0.50 05/19/2012 2000   CALCIUM 9.0 05/19/2012 2000   PROT 5.7* 05/19/2012 2000   ALBUMIN 3.2* 05/19/2012 2000   AST 16 05/19/2012 2000   ALT <5 05/19/2012 2000   ALKPHOS 46 05/19/2012 2000   BILITOT 0.6 05/19/2012 2000   GFRNONAA >90 05/19/2012 2000   GFRAA >90 05/19/2012 2000   Lab Results  Component Value Date   CHOL 145 05/20/2012   HDL 64 05/20/2012   LDLCALC 69 05/20/2012   LDLDIRECT 142.2 10/28/2008   TRIG 60 05/20/2012   CHOLHDL 2.3 05/20/2012   Lab Results  Component Value Date   HGBA1C 6.2* 05/20/2012   No results found for this basename: [id number]   Lab Results  Component Value Date   TSH 2.34 11/12/2011    09/09/12 MRI brain - Mild perisylvian and moderate temporal atrophy. Moderate periventricular and subcortical chronic small vessel ischemic disease. No significant change from prior MRI on 05/08/11.   ASSESSMENT AND PLAN  72 y.o. female with history of hypothyroidism, hypertension, peripheral vascular disease presenting for evaluation and management of Parkinson's disease.  She takes Stalevo 25/100/200, 1 tab 4 times a day.  nonthreatening visual hallucinations are slightly improved. Her short-term memory loss, handwriting and coordination have declined. Patient feels comfortable at her current dosing.    PLAN: 1. Continue stalevo 25/100/200 4 tabs per day     Suanne Marker, MD 10/30/2012, 2:04 PM Certified in Neurology, Neurophysiology and  Neuroimaging  [facility] [address], [address] [place], [place] [address] [phone]

## 2012-10-30 NOTE — Patient Instructions (Signed)
Continue current medications. 

## 2012-11-21 ENCOUNTER — Other Ambulatory Visit: Payer: Self-pay | Admitting: Pulmonary Disease

## 2012-12-08 ENCOUNTER — Ambulatory Visit (HOSPITAL_BASED_OUTPATIENT_CLINIC_OR_DEPARTMENT_OTHER): Payer: Medicare Other | Admitting: Oncology

## 2012-12-08 ENCOUNTER — Other Ambulatory Visit (HOSPITAL_BASED_OUTPATIENT_CLINIC_OR_DEPARTMENT_OTHER): Payer: Medicare Other | Admitting: Lab

## 2012-12-08 ENCOUNTER — Encounter: Payer: Self-pay | Admitting: Oncology

## 2012-12-08 VITALS — BP 132/75 | HR 74 | Temp 98.0°F | Resp 20 | Ht 60.0 in | Wt 113.3 lb

## 2012-12-08 DIAGNOSIS — C50919 Malignant neoplasm of unspecified site of unspecified female breast: Secondary | ICD-10-CM

## 2012-12-08 LAB — COMPREHENSIVE METABOLIC PANEL (CC13)
ALT: <6 U/L (ref 0–55)
Albumin: 3.4 g/dL — ABNORMAL LOW (ref 3.5–5.0)
CO2: 27 mEq/L (ref 22–29)
Chloride: 107 mEq/L (ref 98–107)
Glucose: 123 mg/dl — ABNORMAL HIGH (ref 70–99)
Potassium: 4.1 mEq/L (ref 3.5–5.1)
Sodium: 142 mEq/L (ref 136–145)
Total Protein: 6.1 g/dL — ABNORMAL LOW (ref 6.4–8.3)

## 2012-12-08 LAB — CBC WITH DIFFERENTIAL/PLATELET
BASO%: 0.6 % (ref 0.0–2.0)
Eosinophils Absolute: 0 10*3/uL (ref 0.0–0.5)
MCHC: 33.4 g/dL (ref 31.5–36.0)
MONO#: 0.2 10*3/uL (ref 0.1–0.9)
NEUT#: 3.3 10*3/uL (ref 1.5–6.5)
Platelets: 183 10*3/uL (ref 145–400)
RBC: 4.1 10*6/uL (ref 3.70–5.45)
RDW: 13.6 % (ref 11.2–14.5)
WBC: 4.3 10*3/uL (ref 3.9–10.3)
lymph#: 0.8 10*3/uL — ABNORMAL LOW (ref 0.9–3.3)

## 2012-12-08 NOTE — Progress Notes (Signed)
OFFICE PROGRESS NOTE  CC  Michele Mcalpine, MD [address] [place] [address] Dr. Emelia Loron Dr. 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 Tumor was ER positive PR positive.  #2 she then went on to receive radiation therapy to the left breast from 03/27/2006 to 04/22/1999 leptin.  #3 patient was offered tamoxifen as chemoprevention but she declined.   CURRENT THERAPY: observation  INTERVAL HISTORY: [name] 72 y.o. female returns for followup visit today. She was last seen about a year ago. Clinically she seems to be doing well and is without any significant complaints. She does comes to see me on an annual basis. Patient recently underwent orthopedic surgery with a laminectomy for back pain. She does tell me that she is still having the pain in spite of her surgery. She seems to be a little bit more weak her. She is accompanied by her husband today. She has no clinical evidence of recurrent breast cancer. Remainder of the 10 point review of systems is unremarkable MEDICAL HISTORY: Past Medical History  Diagnosis Date  . Glaucoma   . Abnormal chest x-ray   . Hypertension   . Peripheral vascular disease   . Venous insufficiency   . Hypercholesteremia   . Hypothyroid   . Diverticulosis of colon   . DJD (degenerative joint disease)   . Hip pain   . Lumbar back pain   . Osteopenia   . Vitamin D deficiency   . Parkinson disease   . Anxiety   . Breast cancer     stage 0 left    ALLERGIES:  has No Known Allergies.  MEDICATIONS:  Current Outpatient Prescriptions  Medication Sig Dispense Refill  . aspirin 81 MG tablet Take 81 mg by mouth daily.        . Calcium Carbonate-Vitamin D (CALTRATE 600+D) 600-400 MG-UNIT per tablet Take 1 tablet  by mouth 2 (two) times daily.        . carbidopa-levodopa-entacapone (STALEVO 100) 25-100-200 MG per tablet Take 1 tablet by mouth 4 (four) times daily.  120 tablet  12  . Cholecalciferol (VITAMIN D) 2000 UNITS CAPS Take 1 capsule by mouth daily.        . clopidogrel (PLAVIX) 75 MG tablet TAKE 1 TABLET EVERY DAY  60 tablet  5  . clopidogrel (PLAVIX) 75 MG tablet TAKE 1 TABLET (75 MG TOTAL) BY MOUTH DAILY.  60 tablet  4  . cycloSPORINE (RESTASIS) 0.05 % ophthalmic emulsion Place 1 drop into both eyes 2 (two) times daily.        . Hypromellose (GENTEAL OP) Apply to eye.      . levothyroxine (SYNTHROID, LEVOTHROID) 75 MCG tablet Take 1 tablet (75 mcg total) by mouth daily.  60 tablet  5  . lisinopril (PRINIVIL,ZESTRIL) 10 MG tablet TAKE 1 TABLET EVERY DAY  60 tablet  5  . Multiple Vitamins-Minerals (WOMENS MULTIVITAMIN PLUS PO) Take 1 tablet by mouth daily.        . polyethylene glycol (MIRALAX / GLYCOLAX) packet Take 17 g by mouth daily as needed.  14 each  0  . traMADol (ULTRAM) 50 MG tablet Take 1 tablet by mouth as needed.       No current facility-administered medications for this visit.    SURGICAL  HISTORY:  Past Surgical History  Procedure Laterality Date  . Ovarian cyst removal    . Lapraoscopic cholecystectomy  09/2003    Dr. Derrell Lolling  . Lumbar laminectomy and foraminotomies for spinal stenosis  11/2005    Dr. Darrelyn Hillock  . Cataract surgery and argon laser trabeculoplasty  04/2010    Dr. Elmer Picker  . Colonoscopy    . Polypectomy    . Cholecystectomy    . Breast lumpectomy  8/11 by DrWakefield  . Incise and drain abcess  10/04/11    sebaceous cyst on back  . Lumbar laminectomy/decompression microdiscectomy  05/20/2012    Procedure: LUMBAR LAMINECTOMY/DECOMPRESSION MICRODISCECTOMY 2 LEVELS;  Surgeon: Jacki Cones, MD;  Location: [facility];  Service: Orthopedics;  Laterality: N/A;    REVIEW OF SYSTEMS:  Pertinent items are noted in HPI.   PHYSICAL EXAMINATION: General appearance:  alert, cooperative and appears stated age Neck: no adenopathy, no carotid bruit, no JVD, supple, symmetrical, trachea midline and thyroid not enlarged, symmetric, no tenderness/mass/nodules Lymph nodes: Cervical, supraclavicular, and axillary nodes normal. Resp: clear to auscultation bilaterally and normal percussion bilaterally Back: symmetric, no curvature. ROM normal. No CVA tenderness. Cardio: regular rate and rhythm, S1, S2 normal, no murmur, click, rub or gallop GI: soft, non-tender; bowel sounds normal; no masses,  no organomegaly Extremities: extremities normal, atraumatic, no cyanosis or edema Neurologic: Grossly normal Breast examination: Right breast reveals no evidence of skin changes no nipple discharge no dominant masses. Left breast reveals a well-healed incisional scar no masses no nipple discharge or other skin changes. ECOG PERFORMANCE STATUS: 0 - Asymptomatic  Blood pressure 132/75, pulse 74, temperature 98 F (36.7 C), temperature source Oral, resp. rate 20, height 5' (1.524 m), weight 113 lb 4.8 oz (51.393 kg).  LABORATORY DATA: Lab Results  Component Value Date   WBC 4.3 12/08/2012   HGB 12.8 12/08/2012   HCT 38.3 12/08/2012   MCV 93.4 12/08/2012   PLT 183 12/08/2012      Chemistry      Component Value Date/Time   NA 142 12/08/2012 0926   NA 139 05/19/2012 2000   K 4.1 12/08/2012 0926   K 4.1 05/19/2012 2000   CL 107 12/08/2012 0926   CL 107 05/19/2012 2000   CO2 27 12/08/2012 0926   CO2 25 05/19/2012 2000   BUN 15.9 12/08/2012 0926   BUN 17 05/19/2012 2000   CREATININE 0.8 12/08/2012 0926   CREATININE 0.50 05/19/2012 2000      Component Value Date/Time   CALCIUM 9.1 12/08/2012 0926   CALCIUM 9.0 05/19/2012 2000   ALKPHOS 54 12/08/2012 0926   ALKPHOS 46 05/19/2012 2000   AST 12 12/08/2012 0926   AST 16 05/19/2012 2000   ALT <6 Repeated and Verified 12/08/2012 0926   ALT <5 05/19/2012 2000   BILITOT 0.64 12/08/2012 0926   BILITOT 0.6 05/19/2012 2000        RADIOGRAPHIC STUDIES:  No results found.  ASSESSMENT: 72 year old female with high-grade DCIS measuring 0.25 cm diagnosed in August 2007 when she underwent a lumpectomy. She did receive post lumpectomy radiation therapy to the left breast completed on 04/22/2007 11. Overall she had tolerated the radiation well. Patient was offered tamoxifen as a chemopreventive but she declined. She has been on observation only she seems to be doing well and she is without any evidence of recurrent disease.   PLAN: we will continue to see the patient on a yearly basis. Certainly she needs to continue getting her mammograms performed  to self breast examinations and continue to followup with Dr. Kriste Basque regarding her other medical care.   All questions were answered. The patient knows to call the clinic with any problems, questions or concerns. We can certainly see the patient much sooner if necessary.  I spent 20 minutes counseling the patient face to face. The total time spent in the appointment was 30 minutes.    Drue Second, MD Medical/Oncology [facility] [phone] (beeper) [phone] (Office)  12/08/2012, 10:19 AM

## 2012-12-08 NOTE — Patient Instructions (Addendum)
We will continue to follow you on a yearly basis.

## 2012-12-18 ENCOUNTER — Telehealth: Payer: Self-pay | Admitting: Pulmonary Disease

## 2012-12-18 NOTE — Telephone Encounter (Signed)
Called and spoke with pt and she stated that she had back surgery about 1 month ago and has been taking pain meds for the pain.  She has followed up with her surgeon and was told that this pain was not related to her surgery--she has had recent xrays to check her back.  Was told to follow up with her primary care doctor.    Pt stated that the pain in is her lower back area, does not radiate, hurts when she walks and is up moving around.  Appt with SN tomorrow at 11. Nothing further is needed.

## 2012-12-19 ENCOUNTER — Encounter: Payer: Self-pay | Admitting: Pulmonary Disease

## 2012-12-19 ENCOUNTER — Other Ambulatory Visit (INDEPENDENT_AMBULATORY_CARE_PROVIDER_SITE_OTHER): Payer: Medicare Other

## 2012-12-19 ENCOUNTER — Ambulatory Visit (INDEPENDENT_AMBULATORY_CARE_PROVIDER_SITE_OTHER): Payer: Medicare Other | Admitting: Pulmonary Disease

## 2012-12-19 VITALS — BP 130/68 | HR 71 | Temp 98.3°F | Ht 60.0 in | Wt 111.6 lb

## 2012-12-19 DIAGNOSIS — K4090 Unilateral inguinal hernia, without obstruction or gangrene, not specified as recurrent: Secondary | ICD-10-CM

## 2012-12-19 DIAGNOSIS — K5730 Diverticulosis of large intestine without perforation or abscess without bleeding: Secondary | ICD-10-CM

## 2012-12-19 DIAGNOSIS — R1032 Left lower quadrant pain: Secondary | ICD-10-CM | POA: Diagnosis not present

## 2012-12-19 DIAGNOSIS — G20A1 Parkinson's disease without dyskinesia, without mention of fluctuations: Secondary | ICD-10-CM

## 2012-12-19 DIAGNOSIS — M1990 Unspecified osteoarthritis, unspecified site: Secondary | ICD-10-CM

## 2012-12-19 DIAGNOSIS — E039 Hypothyroidism, unspecified: Secondary | ICD-10-CM

## 2012-12-19 DIAGNOSIS — D259 Leiomyoma of uterus, unspecified: Secondary | ICD-10-CM | POA: Insufficient documentation

## 2012-12-19 DIAGNOSIS — R7309 Other abnormal glucose: Secondary | ICD-10-CM

## 2012-12-19 DIAGNOSIS — C50919 Malignant neoplasm of unspecified site of unspecified female breast: Secondary | ICD-10-CM

## 2012-12-19 DIAGNOSIS — E7800 Pure hypercholesterolemia, unspecified: Secondary | ICD-10-CM

## 2012-12-19 DIAGNOSIS — I10 Essential (primary) hypertension: Secondary | ICD-10-CM

## 2012-12-19 DIAGNOSIS — M545 Low back pain, unspecified: Secondary | ICD-10-CM

## 2012-12-19 DIAGNOSIS — G20 Parkinson's disease: Secondary | ICD-10-CM

## 2012-12-19 DIAGNOSIS — M949 Disorder of cartilage, unspecified: Secondary | ICD-10-CM

## 2012-12-19 DIAGNOSIS — M899 Disorder of bone, unspecified: Secondary | ICD-10-CM

## 2012-12-19 DIAGNOSIS — I872 Venous insufficiency (chronic) (peripheral): Secondary | ICD-10-CM

## 2012-12-19 DIAGNOSIS — I739 Peripheral vascular disease, unspecified: Secondary | ICD-10-CM

## 2012-12-19 LAB — SEDIMENTATION RATE: Sed Rate: 9 mm/hr (ref 0–22)

## 2012-12-19 LAB — TSH: TSH: 2.97 u[IU]/mL (ref 0.35–5.50)

## 2012-12-19 LAB — HEMOGLOBIN A1C: Hgb A1c MFr Bld: 5.9 % (ref 4.6–6.5)

## 2012-12-19 NOTE — Progress Notes (Addendum)
Subjective:    Patient ID: [name], female    DOB: [birth year], 72 y.o.   MRN: [id number]  HPI 72 y/o BF here for a follow up visit... she has mult med problems as noted below...  SEE PREV EPIC NOTES FOR EARLIER DATA >>  ~  Nov 12, 2011:  340mo ROV & [name] has been stable; she had infected seb cyst I&D 4/13 by DrGross & now resolved; we reviewed her prob list, meds, xrays and labs> see below>> CXR 5/13> she forgot to go to [facility] for her yearly f/u film today... LABS 5/13:  FLP- at goals on diet alone;  Chems- wnl & A1c=5.8;  CBC- wnl;  TSH=2.34 on Levo75;  VitD=31 on 2000/d  ~  July 09, 2012:  40mo ROV & [name] is s/p back surg by [facility] 11/13 after which she went to [facility] for rehab 7 now back home w/ home health... We reviewed the following medical problems during today's office visit >>     AbnCXR> f/u film 11/13 showed chr elev right hemidiaph w/ mild basilar atx, normal heart size, NAD; she denies cough, sput, hemoptysis, ch in SOB etc...    HBP> on Lisin10; BP= 150/80 & she denies CP, palpit, but has chr stable DOE, edema, etc; EKG 11/13 showed NSR, rate60, borderline LAD & poor R prog V1=>4...    ASPVDz> on ASA81 + Plavix75; she denies any cerebral ischemic symptoms...    CHOL> on diet alone; last FLP 11/13 showed TChol 145, TG 60, HDL 64, LDL 69    Borderline DM> on diet alone;  Labs 11/13 showed BS= 131, A1c= 6.2    Hypothy> on Synthroid75; last labs 5/13 showed TSH= 2.34    GI- Divertics> on Miralax; there is a +FamHx colon cancer in her father; last colonoscopy 6/12 by DrPatterson showed divertics only...    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    DJD, LBP> she presented 11/13 w/ LBP- s/p decompressive LLam L2-3 for severe sp stenosis 11/13 by DrGioffre & Beane; she has hx prev LLam L3-4 & L4-5; off prev    Percocet & Robaxin; she is still doing  home PT/ OT...    Osteopenia>     Parkinson's dis> on Stalevo & followed by [facility]; he decreased dose from 5/d to 4/d due to hallucinations    Anxiety>  We reviewed prob list, meds, xrays and labs> see below for updates >> she had the 2013 flu vaccine 11/13...  ~  December 19, 2012:  60mo ROV & add-on at pt request> she is c/o LLQ/ pelvic/ inguinal pain x several months- she is rather vague about it (4-5/10, present all the time but worse when up & about, better when sitting or lying down, Tramadol helps ease the pain); she saw DrHaygood, GYN for eval- we don't have notes but they did MRI Pelvis 4/14 showing uterine fibroid, no adnexal masses, fat containing right inguinal hernia; they didn't think the fibroid was the cause of her pain; she also saw DrGioffre, Ortho- we don't have his note but Epic has CT left hip which is neg x for mild degen change, uterine fibroid, diverticulosis; she has seen DrPatterson, GI in the past for colonoscopies- last 6/12 showed only divertics, otherw neg...   We reviewed recent labs from [facility] & rec checking additional labs & CT Abd & Pelvis for completeness; if all this is  neg she indicates that she would like f/u appt w/ DrPattetrson for his input...     We reviewed prob list, meds, xrays and labs> see below for updates >>  LABS 6/14:  Chems- wnl x BS=123 A1c=5.9, LFTs= normal;  CBC- wnl;  TSH=2.97;  Sed=9 CT Abd & Pelvis=> pending          Problem List:  GLAUCOMA (ICD-365.9) - on eye drops daily DrHecker and she reports cataract surg 2010, and Laser Rx 2011> followed for Glaucoma, s/p cats, dry eyes... ~  6/10:  eye exam by DrHecker neg for retinopathy or macular changes... ~  4/11: Argon laser trabeculoplasty planned by DrHecker. ~  She continues yearly check ups w/ DrHecker- no retinopathy...  ABNORMAL CHEST XRAY (ICD-793.1) - she has chr right hemidiaphragm elevation & no changes serially...  ~  CXR 5/09 w/ elev right hemidiaph, no change. ~  CXR 5/11  showed elev right hemidiaph & scarring right base, NAD. ~  CXR 5/12 showed elev right hemidiaph & scarring right base, NAD.Marland Kitchen. ~  CXR 5/13> she forgot to go to [facility] for her film... ~  CXR 11/13 showed chr elev right hemidiaph w/ mild basilar atx, normal heart size, NAD...  HYPERTENSION (ICD-401.9) - on LISINOPRIL 10mg /d w/ good control...  ~  11/11: BP= 120/82> feeling well and denies HA, fatigue, visual changes, CP, palipit, dizziness, syncope, dyspnea, edema, etc... ~  5/12:  BP= 110/70> remains asymptomatic... ~  5/13:  BP= 110/70 & she is feeling well, asymptomatic... ~  1/14: on Lisin10; BP= 150/80 & she denies CP, palpit, but has chr stable DOE, edema, etc; EKG 11/13 showed NSR, rate60, borderline LAD & poor R prog V1=>4. ~  6/14:  On Lisin10;  BP= 130/68 & she denies CP, palpit, ch in SOB/DOE, edema, etc...  PERIPHERAL VASCULAR DISEASE (ICD-443.9) - on ASA 81mg /d & PLAVIX 75mg /d... she is asymptomatic w/o focal weakness, sensory changes, slurring, etc...  VENOUS INSUFFICIENCY (ICD-459.81) - on low sodium diet, etc... denies swelling...  HYPERCHOLESTEROLEMIA (ICD-272.0) - on diet rx alone... ~  FLP 5/08 showed TChol 217, TG 66, HDL 58, LDL 130... ~  FLP 5/09 showed TChol 194, TG 74, HDL 53, LDL 127... she prefers diet alone... ~  FLP 5/10 showed TChol 218, TG 60, HDL 62, LDL 142... still prefers diet + FishOil rec. ~  FLP 5/11 showed TChol 193, TG 49, HDL 63, LDL 120... improved, continue diet, get wt down. ~  FLP 5/12 showed TChol 162, TG 54, HDL 60, LDL 91 ~  FLP 5/13 on diet alone showed TChol 183, TG 40, HDL 72, LDL 103 ~  on diet alone; last FLP 11/13 showed TChol 145, TG 60, HDL 64, LDL 69  GLUCOSE INTOLERANCE, BORDERLINE (ICD-790.29) - on diet rx alone & doing very well... ~  labs 5/08 showed BS=99, HGA1c=5.9 ~  labs 5/09 showed BS= 90, HgA1c= 5.9 ~  labs 5/10 showed BS= 92, A1c= 5.7 ~  labs 5/11 (wt= 126#) showed BS= 92 ~  Labs 5/12 (wt=130#) showed BS= 75 ~  Labs 5/13  (wt=124#) showed BS= 88, A1c= 5.8 ~  9/13: she had neg eye exam from DrHecker- no retinopathy, no macular edema... ~  1/14: on diet alone;  Labs 11/13 showed BS= 131, A1c= 6.2  HYPOTHYROIDISM (ICD-244.9) - on SYNTHROID 36mcg/d... Hx HYPERTHYROIDISM w/ I-131 rx 9/05...  ~  labs 5/08 on Synthroid 31mcg/d showed TSH= 0.73... keep same. ~  labs 5/09 on Synth88 showed TSH= 0.72 ~  labs  5/10 on Synth88 showed TSH= 0.49 ~  labs 5/11 on Synth88 showed TSH= 0.28... rec> decr dose 54mcg/d (she is concerned about "facial hair") ~  labs 11/11 on Synthroid 73mcg/d showed TSH= 3.92... keep same. ~  Labs 5/12 on Synth75 showed TSH= 1.89 ~  Labs 5/13 on Synth75 showed TSH= 2.34  DIVERTICULOSIS OF COLON (ICD-562.10) - there is a +fam hx of colon cancer in her father... ~  last colonoscopy 8/06 by DrPatterson showed divertics, hems... f/u 90yrs. ~  F/u colonoscopy 6/12 by DrPatterson showed divertics, otherw neg...  LLQ/ Left INGUINAL PAIN >>  ~  6/14:  she presented w/  LLQ/ pelvic/ inguinal pain x several months- she is rather vague about it (4-5/10, present all the time but worse when up & about, better when sitting or lying down, Tramadol helps ease the pain); she saw DrHaygood, GYN for eval- we don't have notes but they did MRI Pelvis 4/14 showing uterine fibroid, no adnexal masses, fat containing right inguinal hernia; they didn't think the fibroid was the cause of her pain; she also saw DrGioffre, Ortho- we don't have his note but Epic has CT left hip which is neg x for mild degen change, uterine fibroid, diverticulosis; she has seen DrPatterson, GI in the past for colonoscopies- last 6/12 showed only divertics, otherw neg...   We reviewed recent labs from [facility] & rec checking additional labs & CT Abd & Pelvis for completeness; if all this is neg she indicates that she would like f/u appt w/ DrPattetrson for his input...   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 ~  She continues periodic f/u evals by DrWakefield, DrMoody, & DrKhan==> on observation alone now... 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 ~  6/14: she had yearly f/u DrKhan- DCIS left breast, s/p lumpectomy 8/11, then XRT, pt refused Tamoxifen; stable on observation- neg exam, reminded to get her mammograms...  DEGENERATIVE JOINT DISEASE (ICD-715.90) & HIP PAIN (ICD-719.45) - she takes calcium & vitamins (including "Nature's Code which has 1,200 u Vit D)... prev on Celebrex & Tramadol but uses OTC meds now as needed... she tells me she had a BMD at DrBertrand's in 2008 per her GYN. ~  12/10: presents w/ right hip pain> XRays neg; Rx w/ Dosepak, Mobic, rest, heat... ~  11/11:  c/o left hip pain & XRay showed mild degen changes> Rx MOBIC 7.5mg  Prn...  BACK PAIN, LUMBAR (ICD-724.2) - she is s/p lumbar laminectomy L3-4 & L4-5 6/07 by DrGioffre for spinal stenosis... ~  Lower spine degen changes seen on f/u XRays... ~  11/13: she presented w/ severe LBP & leg symptoms w/ severe spinal stenosis L2-3; DrGioffre/Beane did decompressive LLam L2-3 & decompression of nerve roots at L2 & L3 bilaterally w/ foraminotomies...  OSTEOPENIA (ICD-733.90) - she had a normal BMD in 2008- at [facility] w/ TScores +0.5 in spine & +0.6 in left FemNeck... Vit D level was 24 May09 and started on 50,000 u weekly... f/u Vit D level 11/09 = 47 & switched to OTC Vit D supplement. ~  labs 5/11 showed Vit D level = 29... rec incr VIt D to  2000 u daily. ~  She is encouraged to get a follow up BMD when she has her next mammogram & will let us know...  PARKINSON'S DISEASE (ICD-332.0) - she has mod severe Parkinson's dis  followed by DrPenumalli & DrStacey at [facility] on STALEVO 150mg - 5 times daily... she has been doing well & very stable on this medication (hx of hallucinations on the 200mg  tabs)... ~  5/11: she requests referral to [facility] for f/u Parkinson's> seen by DrPenumalli & no changes made. ~  11/12:  She reports recent eval by DrPenumali for hallucinations> MRI showed mild atrophy & sm vessel ischemic dis & they decr her Stavelo from 5 to 4/d... ~  3/14:  MRI Brain at [facility] showed mod atrophy & sm vessel ischemic dis, otherw neg/ no changes... ~  5/14: she had f/u eval DrPenumalli> on Stalevo Qid now & the reduced dose has decr her hallucinations; hand-writing is worse & short term memory are worse; Seroquel did not help; he rec continue same...  ANXIETY (ICD-300.00) - prev on Prn Valium but off this medication now...  SEBACEOUS CYST>  She mentioned a "mole" on her back during 5/12 OV & exam revealed a mod sized seb cyst; rec to ask DrWakfield to excise this at his convenience==> removed 9/12.   Past Surgical History  Procedure Laterality Date  . Ovarian cyst removal    . Lapraoscopic cholecystectomy  09/2003    Dr. Derrell Lolling  . Lumbar laminectomy and foraminotomies for spinal stenosis  11/2005    Dr. Darrelyn Hillock  . Cataract surgery and argon laser trabeculoplasty  04/2010    Dr. Elmer Picker  . Colonoscopy    . Polypectomy    . Cholecystectomy    . Breast lumpectomy  8/11 by DrWakefield  . Incise and drain abcess  10/04/11    sebaceous cyst on back  . Lumbar laminectomy/decompression microdiscectomy  05/20/2012    Procedure: LUMBAR LAMINECTOMY/DECOMPRESSION MICRODISCECTOMY 2 LEVELS;  Surgeon: Jacki Cones, MD;  Location: [facility];  Service: Orthopedics;  Laterality: N/A;    Outpatient Encounter Prescriptions as of 12/19/2012  Medication Sig Dispense Refill  . aspirin 81 MG tablet Take 81 mg by mouth daily.        . Calcium Carbonate-Vitamin D (CALTRATE 600+D) 600-400 MG-UNIT per  tablet Take 1 tablet by mouth 2 (two) times daily.        . carbidopa-levodopa-entacapone (STALEVO 100) 25-100-200 MG per tablet Take 1 tablet by mouth 4 (four) times daily.  120 tablet  12  . Cholecalciferol (VITAMIN D) 2000 UNITS CAPS Take 1 capsule by mouth daily.        . clopidogrel (PLAVIX) 75 MG tablet TAKE 1 TABLET EVERY DAY  60 tablet  5  . cycloSPORINE (RESTASIS) 0.05 % ophthalmic emulsion Place 1 drop into both eyes 2 (two) times daily.        Marland Kitchen levothyroxine (SYNTHROID, LEVOTHROID) 75 MCG tablet Take 1 tablet (75 mcg total) by mouth daily.  60 tablet  5  . lisinopril (PRINIVIL,ZESTRIL) 10 MG tablet TAKE 1 TABLET EVERY DAY  60 tablet  5  . Multiple Vitamins-Minerals (WOMENS MULTIVITAMIN PLUS PO) Take 1 tablet by mouth daily.        . polyethylene glycol (MIRALAX / GLYCOLAX) packet Take 17 g by mouth daily as needed.  14 each  0  . traMADol (ULTRAM) 50 MG tablet Take 1 tablet by mouth as needed.      . [DISCONTINUED] clopidogrel (PLAVIX) 75 MG tablet TAKE  1 TABLET (75 MG TOTAL) BY MOUTH DAILY.  60 tablet  4  . [DISCONTINUED] Hypromellose (GENTEAL OP) Apply to eye.       No facility-administered encounter medications on file as of 12/19/2012.    No Known Allergies   Current Medications, Allergies, Past Medical History, Past Surgical History, Family History, and Social History were reviewed in [employer] record.   Review of Systems        See HPI - all other systems neg except as noted... The patient complains of dyspnea on exertion, muscle weakness, and difficulty walking.  The patient denies anorexia, fever, weight loss, weight gain, vision loss, decreased hearing, hoarseness, chest pain, syncope, peripheral edema, prolonged cough, headaches, hemoptysis, abdominal pain, melena, hematochezia, severe indigestion/heartburn, hematuria, incontinence, suspicious skin lesions, transient blindness, depression, unusual weight change, abnormal bleeding, enlarged lymph  nodes, and angioedema.     Objective:   Physical Exam    WD, WN, 72 y/o BF in NAD.Marland KitchenMarland Kitchen slow moving but only min tremor from Parkinsons... GENERAL:  Alert & oriented; pleasant & cooperative... HEENT:  [place]/AT, EOM-wnl, PERRLA, EACs-clear, TMs-wnl, NOSE-clear, THROAT-clear & wnl. NECK:  Supple w/ fairROM; no JVD; normal carotid impulses w/o bruits; no thyromegaly or nodules palpated; no lymphadenopathy. CHEST:  Clear to P & A; without wheezes/ rales/ or rhonchi. HEART:  Regular Rhythm; without murmurs/ rubs/ or gallops. ABDOMEN:  Soft & min tender left groin; normal bowel sounds; no organomegaly or masses detected. EXT: without deformities, mod arthritic changes; no varicose veins/ +venous insuffic/ no edema. fairly good ROM in hips etc... NEURO:  CN's intact; min tremor and bradykinesia of Parkinson's; no focal neuro deficits... DERM:  No lesions noted; no rash etc...  RADIOLOGY DATA:  Reviewed in the EPIC EMR & discussed w/ the patient...  LABORATORY DATA:  Reviewed in the EPIC EMR & discussed w/ the patient...   Assessment & Plan:    LLQ, Left groin pain ?etiology>> we will check further labs & CT Abd&Pelvis for further eval=> pending...   HBP>  Stable on Lisinopril, tolerating well, continue same...  ASPVD>  Stable on ASA/ Plavix & denies cerebral ischemic symptoms, she does not want to change meds...  CHOL>  Her lipids are good on diet alone; continue diet efforts...  DM>  Borderline at worst, her BS & A1c has been normal x yrs...  Hypothyroidism>  Clinically stable on 42mcg/d...  GI>  Follow up colon was neg & she is stable...  BREAST CANCER>  She had left breast DCIS excised 8/11 w/ post op XRT as noted; refused Tamoxifen, followed by Andi Hence...  DJD, LBP>  She is s/p LLam- tells me she still managed quite satis on OTC analgesics...  Osteopenia>  Vit D level is 31 & she is rec totake 2000u extra every day; we will sched BMD here...  Parkinsons>  She is stable w/ mod  severe dis on the Stalevo Rx; but this has beed weaned from 5/d to 4/d due to hallucinations by DrPenumali...   Patient's Medications  New Prescriptions   No medications on file  Previous Medications   ASPIRIN 81 MG TABLET    Take 81 mg by mouth daily.     CALCIUM CARBONATE-VITAMIN D (CALTRATE 600+D) 600-400 MG-UNIT PER TABLET    Take 1 tablet by mouth 2 (two) times daily.     CARBIDOPA-LEVODOPA-ENTACAPONE (STALEVO 100) 25-100-200 MG PER TABLET    Take 1 tablet by mouth 4 (four) times daily.   CHOLECALCIFEROL (VITAMIN D) 2000 UNITS  CAPS    Take 1 capsule by mouth daily.     CLOPIDOGREL (PLAVIX) 75 MG TABLET    TAKE 1 TABLET EVERY DAY   CYCLOSPORINE (RESTASIS) 0.05 % OPHTHALMIC EMULSION    Place 1 drop into both eyes 2 (two) times daily.     LEVOTHYROXINE (SYNTHROID, LEVOTHROID) 75 MCG TABLET    Take 1 tablet (75 mcg total) by mouth daily.   LISINOPRIL (PRINIVIL,ZESTRIL) 10 MG TABLET    TAKE 1 TABLET EVERY DAY   MULTIPLE VITAMINS-MINERALS (WOMENS MULTIVITAMIN PLUS PO)    Take 1 tablet by mouth daily.     POLYETHYLENE GLYCOL (MIRALAX / GLYCOLAX) PACKET    Take 17 g by mouth daily as needed.   TRAMADOL (ULTRAM) 50 MG TABLET    Take 1 tablet by mouth as needed.  Modified Medications   No medications on file  Discontinued Medications   CLOPIDOGREL (PLAVIX) 75 MG TABLET    TAKE 1 TABLET (75 MG TOTAL) BY MOUTH DAILY.   HYPROMELLOSE (GENTEAL OP)    Apply to eye.

## 2012-12-19 NOTE — Patient Instructions (Addendum)
Today we updated your med list in our EPIC system...    Continue your current medications the same...  We will arrange for some additional blood work today and a CT scan of your abdomen & pelvis    We will contact you w/ the results when available, and decide on any treatment or further eval at that time...  Call for any questions...  Let's plan a follow up visit in 47mo, sooner if needed for problems.Marland KitchenMarland Kitchen

## 2012-12-23 ENCOUNTER — Ambulatory Visit (INDEPENDENT_AMBULATORY_CARE_PROVIDER_SITE_OTHER)
Admission: RE | Admit: 2012-12-23 | Discharge: 2012-12-23 | Disposition: A | Discharge status: Home / Self Care | Payer: Medicare Other | Source: Ambulatory Visit | Attending: Pulmonary Disease | Admitting: Pulmonary Disease

## 2012-12-23 ENCOUNTER — Other Ambulatory Visit: Payer: Self-pay | Admitting: Pulmonary Disease

## 2012-12-23 DIAGNOSIS — R1032 Left lower quadrant pain: Secondary | ICD-10-CM | POA: Diagnosis not present

## 2012-12-23 DIAGNOSIS — D259 Leiomyoma of uterus, unspecified: Secondary | ICD-10-CM | POA: Diagnosis not present

## 2012-12-23 MED ORDER — IOHEXOL 300 MG/ML  SOLN
100.0000 mL | Freq: Once | INTRAMUSCULAR | Status: AC | PRN
  Administered 2012-12-23: 80 mL via INTRAVENOUS

## 2013-01-06 ENCOUNTER — Encounter: Payer: Self-pay | Admitting: Pulmonary Disease

## 2013-01-06 ENCOUNTER — Ambulatory Visit (INDEPENDENT_AMBULATORY_CARE_PROVIDER_SITE_OTHER): Payer: Medicare Other | Admitting: Pulmonary Disease

## 2013-01-06 VITALS — BP 120/62 | HR 75 | Temp 98.7°F | Ht 60.0 in | Wt 111.4 lb

## 2013-01-06 DIAGNOSIS — E7800 Pure hypercholesterolemia, unspecified: Secondary | ICD-10-CM

## 2013-01-06 DIAGNOSIS — R109 Unspecified abdominal pain: Secondary | ICD-10-CM | POA: Diagnosis not present

## 2013-01-06 DIAGNOSIS — I10 Essential (primary) hypertension: Secondary | ICD-10-CM

## 2013-01-06 DIAGNOSIS — D259 Leiomyoma of uterus, unspecified: Secondary | ICD-10-CM

## 2013-01-06 DIAGNOSIS — K5730 Diverticulosis of large intestine without perforation or abscess without bleeding: Secondary | ICD-10-CM

## 2013-01-06 DIAGNOSIS — G20 Parkinson's disease: Secondary | ICD-10-CM

## 2013-01-06 DIAGNOSIS — K5900 Constipation, unspecified: Secondary | ICD-10-CM | POA: Diagnosis not present

## 2013-01-06 DIAGNOSIS — E039 Hypothyroidism, unspecified: Secondary | ICD-10-CM

## 2013-01-06 NOTE — Progress Notes (Signed)
Subjective:    Patient ID: [name], female    DOB: [birth year], 72 y.o.   MRN: [id number]  HPI 72 y/o BF here for a follow up visit... she has mult med problems as noted below...  SEE PREV EPIC NOTES FOR EARLIER DATA >>  ~  Nov 12, 2011:  76mo ROV & [name] has been stable; she had infected seb cyst I&D 4/13 by DrGross & now resolved; we reviewed her prob list, meds, xrays and labs> see below>> CXR 5/13> she forgot to go to [facility] for her yearly f/u film today... LABS 5/13:  FLP- at goals on diet alone;  Chems- wnl & A1c=5.8;  CBC- wnl;  TSH=2.34 on Levo75;  VitD=31 on 2000/d  ~  July 09, 2012:  34mo ROV & [name] is s/p back surg by [facility] 11/13 after which she went to [facility] for rehab 7 now back home w/ home health... We reviewed the following medical problems during today's office visit >>     AbnCXR> f/u film 11/13 showed chr elev right hemidiaph w/ mild basilar atx, normal heart size, NAD; she denies cough, sput, hemoptysis, ch in SOB etc...    HBP> on Lisin10; BP= 150/80 & she denies CP, palpit, but has chr stable DOE, edema, etc; EKG 11/13 showed NSR, rate60, borderline LAD & poor R prog V1=>4...    ASPVDz> on ASA81 + Plavix75; she denies any cerebral ischemic symptoms...    CHOL> on diet alone; last FLP 11/13 showed TChol 145, TG 60, HDL 64, LDL 69    Borderline DM> on diet alone;  Labs 11/13 showed BS= 131, A1c= 6.2    Hypothy> on Synthroid75; last labs 5/13 showed TSH= 2.34    GI- Divertics> on Miralax; there is a +FamHx colon cancer in her father; last colonoscopy 6/12 by DrPatterson showed divertics only...    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    DJD, LBP> she presented 11/13 w/ LBP- s/p decompressive LLam L2-3 for severe sp stenosis 11/13 by DrGioffre & Beane; she has hx prev LLam L3-4 & L4-5; off prev    Percocet & Robaxin; she is still doing  home PT/ OT...    Osteopenia>     Parkinson's dis> on Stalevo & followed by [facility]; he decreased dose from 5/d to 4/d due to hallucinations    Anxiety>  We reviewed prob list, meds, xrays and labs> see below for updates >> she had the 2013 flu vaccine 11/13...  ~  December 19, 2012:  32mo ROV & add-on at pt request> she is c/o LLQ/ pelvic/ inguinal pain x several months- she is rather vague about it (4-5/10, present all the time but worse when up & about, better when sitting or lying down, Tramadol helps ease the pain); she saw DrHaygood, GYN for eval- we don't have notes but they did MRI Pelvis 4/14 showing uterine fibroid, no adnexal masses, fat containing right inguinal hernia; they didn't think the fibroid was the cause of her pain; she also saw DrGioffre, Ortho- we don't have his note but Epic has CT left hip which is neg x for mild degen change, uterine fibroid, diverticulosis; she has seen DrPatterson, GI in the past for colonoscopies- last 6/12 showed only divertics, otherw neg...   We reviewed recent labs from [facility] & rec checking additional labs & CT Abd & Pelvis for completeness; if all this is  neg she indicates that she would like f/u appt w/ DrPattetrson for his input...     We reviewed prob list, meds, xrays and labs> see below for updates >>  LABS 6/14:  Chems- wnl x BS=123 A1c=5.9, LFTs= normal;  CBC- wnl;  TSH=2.97;  Sed=9 CT Abd & Pelvis=> incr stool burden, no adenop, scat divertics, 7cm uterine fibroid; Incidental findings included 4mm right lung basilar nodule, elev right hemidiaph, cardiomeg, coronary calcif, pancreatic atrophy, s/pGB, bilat renal cysts,marked lumbar spondylosis...   ~  January 06, 2013:  3week ROV & this was actually an OLD appt> [name] reports that her prev LLQ/ pelvic/ inguinal pain has all resolved; CT revealed incr stool burden & she was rec to start Miralax & Senakot-S => BMs improved & discomfort resolved; she has Tramadol for prn use; she will f/u w/ GYN as  well...     We reviewed prob list, meds, xrays and labs> see below for updates >>           Problem List:  GLAUCOMA (ICD-365.9) - on eye drops daily DrHecker and she reports cataract surg 2010, and Laser Rx 2011> followed for Glaucoma, s/p cats, dry eyes... ~  6/10:  eye exam by DrHecker neg for retinopathy or macular changes... ~  4/11: Argon laser trabeculoplasty planned by DrHecker. ~  She continues yearly check ups w/ DrHecker- no retinopathy...  ABNORMAL CHEST XRAY (ICD-793.1) - she has chr right hemidiaphragm elevation & no changes serially...  ~  CXR 5/09 w/ elev right hemidiaph, no change. ~  CXR 5/11 showed elev right hemidiaph & scarring right base, NAD. ~  CXR 5/12 showed elev right hemidiaph & scarring right base, NAD.Marland Kitchen. ~  CXR 5/13> she forgot to go to [facility] for her film... ~  CXR 11/13 showed chr elev right hemidiaph w/ mild basilar atx, normal heart size, NAD...  HYPERTENSION (ICD-401.9) - on LISINOPRIL 10mg /d w/ good control...  ~  11/11: BP= 120/82> feeling well and denies HA, fatigue, visual changes, CP, palipit, dizziness, syncope, dyspnea, edema, etc... ~  5/12:  BP= 110/70> remains asymptomatic... ~  5/13:  BP= 110/70 & she is feeling well, asymptomatic... ~  1/14: on Lisin10; BP= 150/80 & she denies CP, palpit, but has chr stable DOE, edema, etc; EKG 11/13 showed NSR, rate60, borderline LAD & poor R prog V1=>4. ~  6/14:  On Lisin10;  BP= 130/68 & she denies CP, palpit, ch in SOB/DOE, edema, etc...  PERIPHERAL VASCULAR DISEASE (ICD-443.9) - on ASA 81mg /d & PLAVIX 75mg /d... she is asymptomatic w/o focal weakness, sensory changes, slurring, etc...  VENOUS INSUFFICIENCY (ICD-459.81) - on low sodium diet, etc... denies swelling...  HYPERCHOLESTEROLEMIA (ICD-272.0) - on diet rx alone... ~  FLP 5/08 showed TChol 217, TG 66, HDL 58, LDL 130... ~  FLP 5/09 showed TChol 194, TG 74, HDL 53, LDL 127... she prefers diet alone... ~  FLP 5/10 showed TChol 218, TG 60, HDL 62,  LDL 142... still prefers diet + FishOil rec. ~  FLP 5/11 showed TChol 193, TG 49, HDL 63, LDL 120... improved, continue diet, get wt down. ~  FLP 5/12 showed TChol 162, TG 54, HDL 60, LDL 91 ~  FLP 5/13 on diet alone showed TChol 183, TG 40, HDL 72, LDL 103 ~  on diet alone; last FLP 11/13 showed TChol 145, TG 60, HDL 64, LDL 69  GLUCOSE INTOLERANCE, BORDERLINE (ICD-790.29) - on diet rx alone & doing very well... ~  labs 5/08 showed BS=99, HGA1c=5.9 ~  labs 5/09 showed BS= 90, HgA1c= 5.9 ~  labs 5/10 showed BS= 92, A1c= 5.7 ~  labs 5/11 (wt= 126#) showed BS= 92 ~  Labs 5/12 (wt=130#) showed BS= 75 ~  Labs 5/13 (wt=124#) showed BS= 88, A1c= 5.8 ~  9/13: she had neg eye exam from DrHecker- no retinopathy, no macular edema... ~  1/14: on diet alone;  Labs 11/13 showed BS= 131, A1c= 6.2  HYPOTHYROIDISM (ICD-244.9) - on SYNTHROID 7mcg/d... Hx HYPERTHYROIDISM w/ I-131 rx 9/05...  ~  labs 5/08 on Synthroid 39mcg/d showed TSH= 0.73... keep same. ~  labs 5/09 on Synth88 showed TSH= 0.72 ~  labs 5/10 on Synth88 showed TSH= 0.49 ~  labs 5/11 on Synth88 showed TSH= 0.28... rec> decr dose 52mcg/d (she is concerned about "facial hair") ~  labs 11/11 on Synthroid 83mcg/d showed TSH= 3.92... keep same. ~  Labs 5/12 on Synth75 showed TSH= 1.89 ~  Labs 5/13 on Synth75 showed TSH= 2.34  DIVERTICULOSIS OF COLON (ICD-562.10) - there is a +fam hx of colon cancer in her father... ~  last colonoscopy 8/06 by DrPatterson showed divertics, hems... f/u 64yrs. ~  F/u colonoscopy 6/12 by DrPatterson showed divertics, otherw neg...  LLQ/ Left INGUINAL PAIN >>  ~  6/14:  she presented w/  LLQ/ pelvic/ inguinal pain x several months- she is rather vague about it (4-5/10, present all the time but worse when up & about, better when sitting or lying down, Tramadol helps ease the pain); she saw DrHaygood, GYN for eval- we don't have notes but they did MRI Pelvis 4/14 showing uterine fibroid, no adnexal masses, fat  containing right inguinal hernia; they didn't think the fibroid was the cause of her pain; she also saw DrGioffre, Ortho- we don't have his note but Epic has CT left hip which is neg x for mild degen change, uterine fibroid, diverticulosis; she has seen DrPatterson, GI in the past for colonoscopies- last 6/12 showed only divertics, otherw neg...   We reviewed recent labs from [facility] & rec checking additional labs & CT Abd & Pelvis for completeness; if all this is neg she indicates that she would like f/u appt w/ DrPattetrson for his input... CT Abd & Pelvis=> incr stool burden, no adenop, scat divertics, 7cm uterine fibroid; Incidental findings included 4mm right lung basilar nodule, elev right hemidiaph, cardiomeg, coronary calcif, pancreatic atrophy, s/pGB, bilat renal cysts,marked lumbar spondylosis... Pain resolved w/ laxatives.  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 w/ rec for Tamoxifen for 55yrs but she declined Tamoxifen rx after reading the literature on it;  She was referred to XRT & saw [facility] who did 20 XRT treatments... ~  She continues periodic f/u evals by DrWakefield, DrMoody, & DrKhan==> on observation alone now... 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 ~  6/14: she had yearly f/u DrKhan- DCIS left breast, s/p lumpectomy 8/11, then XRT, pt refused Tamoxifen; stable on observation- neg exam, reminded to get her mammograms...  DEGENERATIVE JOINT DISEASE (ICD-715.90) & HIP PAIN (ICD-719.45) - she takes calcium & vitamins (including "Nature's Code which has 1,200 u Vit D)... prev on Celebrex & Tramadol but uses OTC meds now as needed... she tells me she had a BMD at DrBertrand[facility] in 2008 per her GYN. ~  12/10: presents w/ right hip pain> XRays neg; Rx w/  Dosepak, Mobic, rest, heat... ~  11/11:  c/o left  hip pain & XRay showed mild degen changes> Rx MOBIC 7.5mg  Prn...  BACK PAIN, LUMBAR (ICD-724.2) - she is s/p lumbar laminectomy L3-4 & L4-5 6/07 by DrGioffre for spinal stenosis... ~  Lower spine degen changes seen on f/u XRays... ~  11/13: she presented w/ severe LBP & leg symptoms w/ severe spinal stenosis L2-3; DrGioffre/Beane did decompressive LLam L2-3 & decompression of nerve roots at L2 & L3 bilaterally w/ foraminotomies...  OSTEOPENIA (ICD-733.90) - she had a normal BMD in 2008- at [facility] w/ TScores +0.5 in spine & +0.6 in left FemNeck... Vit D level was 24 May09 and started on 50,000 u weekly... f/u Vit D level 11/09 = 47 & switched to OTC Vit D supplement. ~  labs 5/11 showed Vit D level = 29... rec incr VIt D to 2000 u daily. ~  She is encouraged to get a follow up BMD when she has her next mammogram & will let us know...  PARKINSON'S DISEASE (ICD-332.0) - she has mod severe Parkinson's dis followed by DrPenumalli & DrStacey at [facility] on STALEVO 150mg - 5 times daily... she has been doing well & very stable on this medication (hx of hallucinations on the 200mg  tabs)... ~  5/11: she requests referral to [facility] for f/u Parkinson's> seen by DrPenumalli & no changes made. ~  11/12:  She reports recent eval by DrPenumali for hallucinations> MRI showed mild atrophy & sm vessel ischemic dis & they decr her Stavelo from 5 to 4/d... ~  3/14:  MRI Brain at [facility] showed mod atrophy & sm vessel ischemic dis, otherw neg/ no changes... ~  5/14: she had f/u eval DrPenumalli> on Stalevo Qid now & the reduced dose has decr her hallucinations; hand-writing is worse & short term memory are worse; Seroquel did not help; he rec continue same...  ANXIETY (ICD-300.00) - prev on Prn Valium but off this medication now...  SEBACEOUS CYST>  She mentioned a "mole" on her back during 5/12 OV & exam revealed a mod sized seb cyst; rec to ask DrWakfield to excise this at his convenience==>  removed 9/12.   Past Surgical History  Procedure Laterality Date  . Ovarian cyst removal    . Lapraoscopic cholecystectomy  09/2003    Dr. Derrell Lolling  . Lumbar laminectomy and foraminotomies for spinal stenosis  11/2005    Dr. Darrelyn Hillock  . Cataract surgery and argon laser trabeculoplasty  04/2010    Dr. Elmer Picker  . Colonoscopy    . Polypectomy    . Cholecystectomy    . Breast lumpectomy  8/11 by DrWakefield  . Incise and drain abcess  10/04/11    sebaceous cyst on back  . Lumbar laminectomy/decompression microdiscectomy  05/20/2012    Procedure: LUMBAR LAMINECTOMY/DECOMPRESSION MICRODISCECTOMY 2 LEVELS;  Surgeon: Jacki Cones, MD;  Location: [facility];  Service: Orthopedics;  Laterality: N/A;    Outpatient Encounter Prescriptions as of 01/06/2013  Medication Sig Dispense Refill  . aspirin 81 MG tablet Take 81 mg by mouth daily.        . Calcium Carbonate-Vitamin D (CALTRATE 600+D) 600-400 MG-UNIT per tablet Take 1 tablet by mouth 2 (two) times daily.        . carbidopa-levodopa-entacapone (STALEVO 100) 25-100-200 MG per tablet Take 1 tablet by mouth 4 (four) times daily.  120 tablet  12  . Cholecalciferol (VITAMIN D) 2000 UNITS CAPS Take 1 capsule by mouth daily.        Marland Kitchen  clopidogrel (PLAVIX) 75 MG tablet TAKE 1 TABLET EVERY DAY  60 tablet  5  . cycloSPORINE (RESTASIS) 0.05 % ophthalmic emulsion Place 1 drop into both eyes 2 (two) times daily.        Marland Kitchen levothyroxine (SYNTHROID, LEVOTHROID) 75 MCG tablet Take 1 tablet (75 mcg total) by mouth daily.  60 tablet  5  . lisinopril (PRINIVIL,ZESTRIL) 10 MG tablet TAKE 1 TABLET EVERY DAY  60 tablet  5  . Multiple Vitamins-Minerals (WOMENS MULTIVITAMIN PLUS PO) Take 1 tablet by mouth daily.        . polyethylene glycol (MIRALAX / GLYCOLAX) packet Take 17 g by mouth daily as needed.  14 each  0  . traMADol (ULTRAM) 50 MG tablet Take 1 tablet by mouth as needed.       No facility-administered encounter medications on file as of 01/06/2013.    No  Known Allergies   Current Medications, Allergies, Past Medical History, Past Surgical History, Family History, and Social History were reviewed in [employer] record.   Review of Systems        See HPI - all other systems neg except as noted... The patient complains of dyspnea on exertion, muscle weakness, and difficulty walking.  The patient denies anorexia, fever, weight loss, weight gain, vision loss, decreased hearing, hoarseness, chest pain, syncope, peripheral edema, prolonged cough, headaches, hemoptysis, abdominal pain, melena, hematochezia, severe indigestion/heartburn, hematuria, incontinence, suspicious skin lesions, transient blindness, depression, unusual weight change, abnormal bleeding, enlarged lymph nodes, and angioedema.     Objective:   Physical Exam    WD, WN, 72 y/o BF in NAD.Marland KitchenMarland Kitchen slow moving but only min tremor from Parkinsons... GENERAL:  Alert & oriented; pleasant & cooperative... HEENT:  [place]/AT, EOM-wnl, PERRLA, EACs-clear, TMs-wnl, NOSE-clear, THROAT-clear & wnl. NECK:  Supple w/ fairROM; no JVD; normal carotid impulses w/o bruits; no thyromegaly or nodules palpated; no lymphadenopathy. CHEST:  Clear to P & A; without wheezes/ rales/ or rhonchi. HEART:  Regular Rhythm; without murmurs/ rubs/ or gallops. ABDOMEN:  Soft & min tender left groin; normal bowel sounds; no organomegaly or masses detected. EXT: without deformities, mod arthritic changes; no varicose veins/ +venous insuffic/ no edema. fairly good ROM in hips etc... NEURO:  CN's intact; min tremor and bradykinesia of Parkinson's; no focal neuro deficits... DERM:  No lesions noted; no rash etc...  RADIOLOGY DATA:  Reviewed in the EPIC EMR & discussed w/ the patient...  LABORATORY DATA:  Reviewed in the EPIC EMR & discussed w/ the patient...   Assessment & Plan:    LLQ, Left groin pain ?etiology>> CT Abd&Pelvis showed incr stool burden & incidental findings- pain resolved w/ lax  use...   HBP>  Stable on Lisinopril, tolerating well, continue same...  ASPVD>  Stable on ASA/ Plavix & denies cerebral ischemic symptoms, she does not want to change meds...  CHOL>  Her lipids are good on diet alone; continue diet efforts...  DM>  Borderline at worst, her BS & A1c has been normal x yrs...  Hypothyroidism>  Clinically stable on 68mcg/d...  GI>  Follow up colon was neg & she is stable...  BREAST CANCER>  She had left breast DCIS excised 8/11 w/ post op XRT as noted; refused Tamoxifen, followed by Andi Hence...  DJD, LBP>  She is s/p LLam- tells me she still managed quite satis on OTC analgesics...  Osteopenia>  Vit D level is 31 & she is rec totake 2000u extra every day; we will sched BMD here.Marland KitchenMarland Kitchen  Parkinsons>  She is stable w/ mod severe dis on the Stalevo Rx; but this has beed weaned from 5/d to 4/d due to hallucinations by DrPenumali...   Patient's Medications  New Prescriptions   No medications on file  Previous Medications   ASPIRIN 81 MG TABLET    Take 81 mg by mouth daily.     CALCIUM CARBONATE-VITAMIN D (CALTRATE 600+D) 600-400 MG-UNIT PER TABLET    Take 1 tablet by mouth 2 (two) times daily.     CARBIDOPA-LEVODOPA-ENTACAPONE (STALEVO 100) 25-100-200 MG PER TABLET    Take 1 tablet by mouth 4 (four) times daily.   CHOLECALCIFEROL (VITAMIN D) 2000 UNITS CAPS    Take 1 capsule by mouth daily.     CLOPIDOGREL (PLAVIX) 75 MG TABLET    TAKE 1 TABLET EVERY DAY   CYCLOSPORINE (RESTASIS) 0.05 % OPHTHALMIC EMULSION    Place 1 drop into both eyes 2 (two) times daily.     LEVOTHYROXINE (SYNTHROID, LEVOTHROID) 75 MCG TABLET    Take 1 tablet (75 mcg total) by mouth daily.   LISINOPRIL (PRINIVIL,ZESTRIL) 10 MG TABLET    TAKE 1 TABLET EVERY DAY   MULTIPLE VITAMINS-MINERALS (WOMENS MULTIVITAMIN PLUS PO)    Take 1 tablet by mouth daily.     POLYETHYLENE GLYCOL (MIRALAX / GLYCOLAX) PACKET    Take 17 g by mouth daily as needed.   TRAMADOL (ULTRAM) 50 MG TABLET    Take 1 tablet by  mouth as needed.  Modified Medications   No medications on file  Discontinued Medications   No medications on file

## 2013-01-06 NOTE — Patient Instructions (Addendum)
Today we updated your med list in our EPIC system...    Continue your current medications the same...  Continue the [facility] daily for your bowel movements...  Try the OTC SENAKOT-S 1-2 tabs at bedtime to keep your stools soft & easy to evacuate...  Call for any questions...  Let's plan a follow up visit in 3-57mo, sooner if needed for problems.Marland KitchenMarland Kitchen

## 2013-01-22 ENCOUNTER — Ambulatory Visit: Payer: Medicare Other | Admitting: Pulmonary Disease

## 2013-02-09 ENCOUNTER — Other Ambulatory Visit: Payer: Self-pay | Admitting: *Deleted

## 2013-02-09 DIAGNOSIS — G20 Parkinson's disease: Secondary | ICD-10-CM

## 2013-02-16 ENCOUNTER — Ambulatory Visit: Payer: Medicare Other | Attending: Diagnostic Neuroimaging

## 2013-02-16 DIAGNOSIS — R293 Abnormal posture: Secondary | ICD-10-CM | POA: Insufficient documentation

## 2013-02-16 DIAGNOSIS — IMO0001 Reserved for inherently not codable concepts without codable children: Secondary | ICD-10-CM | POA: Insufficient documentation

## 2013-02-16 DIAGNOSIS — G20 Parkinson's disease: Secondary | ICD-10-CM | POA: Diagnosis not present

## 2013-02-16 DIAGNOSIS — G20A1 Parkinson's disease without dyskinesia, without mention of fluctuations: Secondary | ICD-10-CM | POA: Insufficient documentation

## 2013-02-16 DIAGNOSIS — R279 Unspecified lack of coordination: Secondary | ICD-10-CM | POA: Diagnosis not present

## 2013-02-17 ENCOUNTER — Ambulatory Visit: Payer: Medicare Other | Admitting: Physical Therapy

## 2013-02-18 ENCOUNTER — Ambulatory Visit: Payer: Medicare Other

## 2013-02-24 ENCOUNTER — Ambulatory Visit: Payer: Medicare Other | Attending: Diagnostic Neuroimaging | Admitting: Physical Therapy

## 2013-02-24 DIAGNOSIS — IMO0001 Reserved for inherently not codable concepts without codable children: Secondary | ICD-10-CM | POA: Diagnosis not present

## 2013-02-24 DIAGNOSIS — R279 Unspecified lack of coordination: Secondary | ICD-10-CM | POA: Diagnosis not present

## 2013-02-24 DIAGNOSIS — G20 Parkinson's disease: Secondary | ICD-10-CM | POA: Diagnosis not present

## 2013-02-24 DIAGNOSIS — R293 Abnormal posture: Secondary | ICD-10-CM | POA: Insufficient documentation

## 2013-02-24 DIAGNOSIS — G20A1 Parkinson's disease without dyskinesia, without mention of fluctuations: Secondary | ICD-10-CM | POA: Insufficient documentation

## 2013-02-25 ENCOUNTER — Ambulatory Visit: Payer: Medicare Other

## 2013-02-26 ENCOUNTER — Ambulatory Visit: Payer: Medicare Other

## 2013-03-03 ENCOUNTER — Ambulatory Visit: Payer: Medicare Other

## 2013-03-03 ENCOUNTER — Ambulatory Visit: Payer: Medicare Other | Admitting: Physical Therapy

## 2013-03-05 ENCOUNTER — Ambulatory Visit: Payer: Medicare Other

## 2013-03-10 ENCOUNTER — Ambulatory Visit: Payer: Medicare Other | Admitting: Physical Therapy

## 2013-03-11 ENCOUNTER — Ambulatory Visit: Payer: Medicare Other

## 2013-03-12 ENCOUNTER — Ambulatory Visit: Payer: Medicare Other

## 2013-03-12 ENCOUNTER — Telehealth: Payer: Self-pay | Admitting: Diagnostic Neuroimaging

## 2013-03-16 ENCOUNTER — Ambulatory Visit: Payer: Medicare Other

## 2013-03-16 ENCOUNTER — Ambulatory Visit: Payer: Medicare Other | Admitting: Pulmonary Disease

## 2013-03-16 DIAGNOSIS — H35379 Puckering of macula, unspecified eye: Secondary | ICD-10-CM | POA: Diagnosis not present

## 2013-03-16 DIAGNOSIS — H04129 Dry eye syndrome of unspecified lacrimal gland: Secondary | ICD-10-CM | POA: Diagnosis not present

## 2013-03-16 DIAGNOSIS — H35039 Hypertensive retinopathy, unspecified eye: Secondary | ICD-10-CM | POA: Diagnosis not present

## 2013-03-16 DIAGNOSIS — H4011X0 Primary open-angle glaucoma, stage unspecified: Secondary | ICD-10-CM | POA: Diagnosis not present

## 2013-03-16 DIAGNOSIS — H409 Unspecified glaucoma: Secondary | ICD-10-CM | POA: Diagnosis not present

## 2013-03-16 DIAGNOSIS — H524 Presbyopia: Secondary | ICD-10-CM | POA: Diagnosis not present

## 2013-03-16 DIAGNOSIS — E119 Type 2 diabetes mellitus without complications: Secondary | ICD-10-CM | POA: Diagnosis not present

## 2013-03-17 ENCOUNTER — Ambulatory Visit: Payer: Medicare Other

## 2013-03-19 ENCOUNTER — Ambulatory Visit: Payer: Medicare Other

## 2013-03-24 ENCOUNTER — Ambulatory Visit: Payer: Medicare Other

## 2013-03-26 ENCOUNTER — Ambulatory Visit: Payer: Medicare Other | Attending: Diagnostic Neuroimaging | Admitting: Physical Therapy

## 2013-03-26 DIAGNOSIS — R279 Unspecified lack of coordination: Secondary | ICD-10-CM | POA: Insufficient documentation

## 2013-03-26 DIAGNOSIS — G20 Parkinson's disease: Secondary | ICD-10-CM | POA: Diagnosis not present

## 2013-03-26 DIAGNOSIS — IMO0001 Reserved for inherently not codable concepts without codable children: Secondary | ICD-10-CM | POA: Insufficient documentation

## 2013-03-26 DIAGNOSIS — G20A1 Parkinson's disease without dyskinesia, without mention of fluctuations: Secondary | ICD-10-CM | POA: Insufficient documentation

## 2013-03-26 DIAGNOSIS — R293 Abnormal posture: Secondary | ICD-10-CM | POA: Diagnosis not present

## 2013-03-31 ENCOUNTER — Ambulatory Visit: Payer: Medicare Other

## 2013-04-02 ENCOUNTER — Ambulatory Visit: Payer: Medicare Other

## 2013-04-07 ENCOUNTER — Ambulatory Visit: Payer: Medicare Other | Admitting: Pulmonary Disease

## 2013-04-10 LAB — HM MAMMOGRAPHY: HM MAMMO: NORMAL

## 2013-04-14 DIAGNOSIS — Z9889 Other specified postprocedural states: Secondary | ICD-10-CM | POA: Diagnosis not present

## 2013-04-29 ENCOUNTER — Encounter: Payer: Self-pay | Admitting: Pulmonary Disease

## 2013-06-03 ENCOUNTER — Telehealth: Payer: Self-pay | Admitting: Diagnostic Neuroimaging

## 2013-06-03 NOTE — Telephone Encounter (Signed)
At check out today, husband states that patient needs Exelon patches since the box he has is expired. Husband states the patches were given to his wife at our office and it wasn't sent to a pharmacy. Please call.

## 2013-06-03 NOTE — Telephone Encounter (Signed)
Please advise 

## 2013-06-03 NOTE — Telephone Encounter (Signed)
I do not see Exelon on med list.  I called and spoke Mr Lindenbaum at great length.  He said they got samples of Exelon in May and did not use them.  He says the patient has lost a lot of weight and is hallucinating.  For example, he said she tells him she sees people in the car.  He would like to know if they should start Exelon, and if so, he says they would like a Rx called in.  If not, he would like to know what else they can do.  Please advise.  Thank you.

## 2013-06-05 ENCOUNTER — Telehealth: Payer: Self-pay | Admitting: Diagnostic Neuroimaging

## 2013-06-05 NOTE — Telephone Encounter (Signed)
Please advise 

## 2013-06-17 ENCOUNTER — Encounter: Payer: Self-pay | Admitting: Pulmonary Disease

## 2013-06-17 ENCOUNTER — Other Ambulatory Visit (INDEPENDENT_AMBULATORY_CARE_PROVIDER_SITE_OTHER): Payer: Medicare Other

## 2013-06-17 ENCOUNTER — Ambulatory Visit (INDEPENDENT_AMBULATORY_CARE_PROVIDER_SITE_OTHER): Payer: Medicare Other | Admitting: Pulmonary Disease

## 2013-06-17 ENCOUNTER — Ambulatory Visit (INDEPENDENT_AMBULATORY_CARE_PROVIDER_SITE_OTHER)
Admission: RE | Admit: 2013-06-17 | Discharge: 2013-06-17 | Disposition: A | Discharge status: Home / Self Care | Payer: Medicare Other | Source: Ambulatory Visit | Attending: Pulmonary Disease | Admitting: Pulmonary Disease

## 2013-06-17 VITALS — BP 104/66 | HR 59 | Temp 98.2°F | Ht 60.0 in | Wt 118.8 lb

## 2013-06-17 DIAGNOSIS — M1990 Unspecified osteoarthritis, unspecified site: Secondary | ICD-10-CM

## 2013-06-17 DIAGNOSIS — I739 Peripheral vascular disease, unspecified: Secondary | ICD-10-CM

## 2013-06-17 DIAGNOSIS — G20 Parkinson's disease: Secondary | ICD-10-CM

## 2013-06-17 DIAGNOSIS — C50919 Malignant neoplasm of unspecified site of unspecified female breast: Secondary | ICD-10-CM

## 2013-06-17 DIAGNOSIS — R300 Dysuria: Secondary | ICD-10-CM

## 2013-06-17 DIAGNOSIS — R443 Hallucinations, unspecified: Secondary | ICD-10-CM

## 2013-06-17 DIAGNOSIS — I10 Essential (primary) hypertension: Secondary | ICD-10-CM

## 2013-06-17 DIAGNOSIS — J9819 Other pulmonary collapse: Secondary | ICD-10-CM | POA: Diagnosis not present

## 2013-06-17 DIAGNOSIS — E039 Hypothyroidism, unspecified: Secondary | ICD-10-CM

## 2013-06-17 DIAGNOSIS — M545 Low back pain: Secondary | ICD-10-CM

## 2013-06-17 DIAGNOSIS — M899 Disorder of bone, unspecified: Secondary | ICD-10-CM

## 2013-06-17 DIAGNOSIS — I872 Venous insufficiency (chronic) (peripheral): Secondary | ICD-10-CM

## 2013-06-17 DIAGNOSIS — K5900 Constipation, unspecified: Secondary | ICD-10-CM

## 2013-06-17 DIAGNOSIS — R930 Abnormal findings on diagnostic imaging of skull and head, not elsewhere classified: Secondary | ICD-10-CM

## 2013-06-17 DIAGNOSIS — F0390 Unspecified dementia without behavioral disturbance: Secondary | ICD-10-CM

## 2013-06-17 DIAGNOSIS — K5730 Diverticulosis of large intestine without perforation or abscess without bleeding: Secondary | ICD-10-CM

## 2013-06-17 LAB — CBC WITH DIFFERENTIAL/PLATELET
Basophils Absolute: 0 K/uL (ref 0.0–0.1)
Basophils Relative: 0.4 % (ref 0.0–3.0)
Eosinophils Absolute: 0 K/uL (ref 0.0–0.7)
Eosinophils Relative: 0.3 % (ref 0.0–5.0)
HCT: 40.4 % (ref 36.0–46.0)
Hemoglobin: 13.3 g/dL (ref 12.0–15.0)
Lymphocytes Relative: 19.7 % (ref 12.0–46.0)
Lymphs Abs: 0.8 K/uL (ref 0.7–4.0)
MCHC: 33 g/dL (ref 30.0–36.0)
MCV: 91.9 fl (ref 78.0–100.0)
Monocytes Absolute: 0.2 K/uL (ref 0.1–1.0)
Monocytes Relative: 4 % (ref 3.0–12.0)
Neutro Abs: 3.2 K/uL (ref 1.4–7.7)
Neutrophils Relative %: 75.6 % (ref 43.0–77.0)
Platelets: 202 K/uL (ref 150.0–400.0)
RBC: 4.39 Mil/uL (ref 3.87–5.11)
RDW: 13.5 % (ref 11.5–14.6)
WBC: 4.3 K/uL — ABNORMAL LOW (ref 4.5–10.5)

## 2013-06-17 LAB — URINALYSIS, ROUTINE W REFLEX MICROSCOPIC
Bilirubin Urine: NEGATIVE
Hgb urine dipstick: NEGATIVE
Total Protein, Urine: NEGATIVE
Urine Glucose: NEGATIVE

## 2013-06-17 LAB — BASIC METABOLIC PANEL
BUN: 14 mg/dL (ref 6–23)
CO2: 29 mEq/L (ref 19–32)
Chloride: 106 mEq/L (ref 96–112)
Potassium: 4.1 mEq/L (ref 3.5–5.1)

## 2013-06-17 MED ORDER — RIVASTIGMINE 4.6 MG/24HR TD PT24: 4.6000 mg | MEDICATED_PATCH | Freq: Every day | TRANSDERMAL | Status: DC

## 2013-06-17 NOTE — Patient Instructions (Signed)
Today we updated your med list in our EPIC system...    Continue your current medications the same...  We wrote a new prescription for the Exelon patches that DrPennumali prescribed earlier this yr...    Please give [place] a call in about a month w/ report on how she is doing w/ this medication on board...  Today we did a follow up CXR, blood work & urinalysis - just to be sure there are no other explanations for her hallucinations...  Call for any questions...  Let's plan a follow up visit in 51mo, sooner if needed for problems.Marland KitchenMarland Kitchen

## 2013-06-17 NOTE — Telephone Encounter (Signed)
Dr. Jodelle Green office called and wanted to know if Rx was written for Exelon patch.  He did not want to do anything without checking with [facility] first.  The patient's husband said they never used the samples given and now they are expired.  I spoke with [facility] who advised to give more samples and see how they work.

## 2013-06-17 NOTE — Progress Notes (Signed)
Subjective:     Patient ID: [name], female   DOB: [birth year], 72 y.o.   MRN: [id number]  HPI  Subjective:    Patient ID: [name], female    DOB: 1941-05-07, 72 y.o.   MRN: [id number]  HPI 72 y/o BF here for a follow up visit... she has mult med problems as noted below...  SEE PREV EPIC NOTES FOR EARLIER DATA >>  ~  Nov 12, 2011:  43mo ROV & Heath has been stable; she had infected seb cyst I&D 4/13 by DrGross & now resolved; we reviewed her prob list, meds, xrays and labs> see below>> CXR 5/13> she forgot to go to [facility] for her yearly f/u film today... LABS 5/13:  FLP- at goals on diet alone;  Chems- wnl & A1c=5.8;  CBC- wnl;  TSH=2.34 on Levo75;  VitD=31 on 2000/d  ~  July 09, 2012:  78mo ROV & [name] is s/p back surg by [facility] 11/13 after which she went to [facility] for rehab 7 now back home w/ home health... We reviewed the following medical problems during today's office visit >>     AbnCXR> f/u film 11/13 showed chr elev right hemidiaph w/ mild basilar atx, normal heart size, NAD; she denies cough, sput, hemoptysis, ch in SOB etc...    HBP> on Lisin10; BP= 150/80 & she denies CP, palpit, but has chr stable DOE, edema, etc; EKG 11/13 showed NSR, rate60, borderline LAD & poor R prog V1=>4...    ASPVDz> on ASA81 + Plavix75; she denies any cerebral ischemic symptoms...    CHOL> on diet alone; last FLP 11/13 showed TChol 145, TG 60, HDL 64, LDL 69    Borderline DM> on diet alone;  Labs 11/13 showed BS= 131, A1c= 6.2    Hypothy> on Synthroid75; last labs 5/13 showed TSH= 2.34    GI- Divertics> on Miralax; there is a +FamHx colon cancer in her father; last colonoscopy 6/12 by DrPatterson showed divertics only...    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    DJD, LBP> she presented 11/13 w/ LBP- s/p decompressive LLam L2-3 for severe sp stenosis 11/13 by  DrGioffre & Beane; she has hx prev LLam L3-4 & L4-5; off prev    Percocet & Robaxin; she is still doing home PT/ OT...    Osteopenia>     Parkinson's dis> on Stalevo & followed by [facility]; he decreased dose from 5/d to 4/d due to hallucinations    Anxiety>  We reviewed prob list, meds, xrays and labs> see below for updates >> she had the 2013 flu vaccine 11/13...  ~  December 19, 2012:  110mo ROV & add-on at pt request> she is c/o LLQ/ pelvic/ inguinal pain x several months- she is rather vague about it (4-5/10, present all the time but worse when up & about, better when sitting or lying down, Tramadol helps ease the pain); she saw DrHaygood, GYN for eval- we don't have notes but they did MRI Pelvis 4/14 showing uterine fibroid, no adnexal masses, fat containing right inguinal hernia; they didn't think the fibroid was the cause of her pain; she also saw DrGioffre, Ortho- we don't have his note but Epic has CT left hip which is neg x for mild degen change, uterine fibroid, diverticulosis; she has seen DrPatterson, GI in the past for colonoscopies- last 6/12 showed only divertics, otherw neg.Marland KitchenMarland Kitchen  We reviewed recent labs from [facility] & rec checking additional labs & CT Abd & Pelvis for completeness; if all this is neg she indicates that she would like f/u appt w/ DrPattetrson for his input...     We reviewed prob list, meds, xrays and labs> see below for updates >>  LABS 6/14:  Chems- wnl x BS=123 A1c=5.9, LFTs= normal;  CBC- wnl;  TSH=2.97;  Sed=9 CT Abd & Pelvis=> incr stool burden, no adenop, scat divertics, 7cm uterine fibroid; Incidental findings included 4mm right lung basilar nodule, elev right hemidiaph, cardiomeg, coronary calcif, pancreatic atrophy, s/pGB, bilat renal cysts,marked lumbar spondylosis...   ~  January 06, 2013:  3week ROV & this was actually an OLD appt> [name] reports that her prev LLQ/ pelvic/ inguinal pain has all resolved; CT revealed incr stool burden & she was rec to start Miralax &  Senakot-S => BMs improved & discomfort resolved; she has Tramadol for prn use; she will f/u w/ GYN as well...     We reviewed prob list, meds, xrays and labs> see below for updates >>   ~  June 17, 2013:  60mo ROV & the pt says she is doing OK but her husb indicates that she is having hallucinations (no agitation); this was a side effect of her Parkinson's meds and she is followed by [facility]- DrPenumalli & she is asked to f/u w/ him for med adjustment ASAP;  She is on Exelon patches and the dose was recently incr to 4.6mg /24h patch plus her Stalevo Qid... Note- their daugh is a [employer] Oncology nurse & very helpful to her parents...     She remains on ASA/ Plavix 7 denies any cerebral ischemic symptoms...    BP= 104/66 on her Lisin10 & she denies CP, palpit, ch in SOB, edema, etc...     She remains clinically euthyroid on her Synthroid75/d...     She uses Tramadol & Tylenol as needed for arthritis pain & back discomfort...  We reviewed prob list, meds, xrays and labs> see below for updates >> she had the 2014 Flu vaccine in Sept... CXR 11/14 showed norm heart size, elev right hemidiaph w/ atx & scarring, NAD... LABS 12/14:  Chems- wnl;  CBC- wnl;  Urine culture is neg...          Problem List:  GLAUCOMA (ICD-365.9) - on eye drops daily DrHecker and she reports cataract surg 2010, and Laser Rx 2011> followed for Glaucoma, s/p cats, dry eyes... ~  6/10:  eye exam by DrHecker neg for retinopathy or macular changes... ~  4/11: Argon laser trabeculoplasty planned by DrHecker. ~  She continues yearly check ups w/ DrHecker- no retinopathy...  ABNORMAL CHEST XRAY (ICD-793.1) - she has chr right hemidiaphragm elevation & no changes serially...  ~  CXR 5/09 w/ elev right hemidiaph, no change. ~  CXR 5/11 showed elev right hemidiaph & scarring right base, NAD. ~  CXR 5/12 showed elev right hemidiaph & scarring right base, NAD.Marland Kitchen. ~  CXR 5/13> she forgot to go to [facility] for her film... ~  CXR 11/13  showed chr elev right hemidiaph w/ mild basilar atx, normal heart size, NAD.Marland Kitchen. ~  CXR 11/14 showed norm heart size, elev right hemidiaph w/ atx & scarring, NAD.  HYPERTENSION (ICD-401.9) - on LISINOPRIL 10mg /d w/ good control...  ~  11/11: BP= 120/82> feeling well and denies HA, fatigue, visual changes, CP, palipit, dizziness, syncope, dyspnea, edema, etc... ~  5/12:  BP= 110/70> remains asymptomatic.Marland KitchenMarland Kitchen ~  5/13:  BP= 110/70 & she is feeling well, asymptomatic... ~  1/14: on Lisin10; BP= 150/80 & she denies CP, palpit, but has chr stable DOE, edema, etc; EKG 11/13 showed NSR, rate60, borderline LAD & poor R prog V1=>4. ~  6/14:  On Lisin10;  BP= 130/68 & she denies CP, palpit, ch in SOB/DOE, edema, etc... ~  11/14:  BP= 104/66 on her Lisin10 & she denies CP, palpit, ch in SOB, edema, etc...  PERIPHERAL VASCULAR DISEASE (ICD-443.9) - on ASA 81mg /d & PLAVIX 75mg /d... she is asymptomatic w/o focal weakness, sensory changes, slurring, etc...  VENOUS INSUFFICIENCY (ICD-459.81) - on low sodium diet, etc... denies swelling...  HYPERCHOLESTEROLEMIA (ICD-272.0) - on diet rx alone... ~  FLP 5/08 showed TChol 217, TG 66, HDL 58, LDL 130... ~  FLP 5/09 showed TChol 194, TG 74, HDL 53, LDL 127... she prefers diet alone... ~  FLP 5/10 showed TChol 218, TG 60, HDL 62, LDL 142... still prefers diet + FishOil rec. ~  FLP 5/11 showed TChol 193, TG 49, HDL 63, LDL 120... improved, continue diet, get wt down. ~  FLP 5/12 showed TChol 162, TG 54, HDL 60, LDL 91 ~  FLP 5/13 on diet alone showed TChol 183, TG 40, HDL 72, LDL 103 ~  on diet alone; last FLP 11/13 showed TChol 145, TG 60, HDL 64, LDL 69  GLUCOSE INTOLERANCE, BORDERLINE (ICD-790.29) - on diet rx alone & doing very well... ~  labs 5/08 showed BS=99, HGA1c=5.9 ~  labs 5/09 showed BS= 90, HgA1c= 5.9 ~  labs 5/10 showed BS= 92, A1c= 5.7 ~  labs 5/11 (wt= 126#) showed BS= 92 ~  Labs 5/12 (wt=130#) showed BS= 75 ~  Labs 5/13 (wt=124#) showed BS= 88,  A1c= 5.8 ~  9/13: she had neg eye exam from DrHecker- no retinopathy, no macular edema... ~  1/14: on diet alone;  Labs 11/13 showed BS= 131, A1c= 6.2 ~  11/14: on diet alone & her BS= 100  HYPOTHYROIDISM (ICD-244.9) - on SYNTHROID 46mcg/d... Hx HYPERTHYROIDISM w/ I-131 rx 9/05...  ~  labs 5/08 on Synthroid 35mcg/d showed TSH= 0.73... keep same. ~  labs 5/09 on Synth88 showed TSH= 0.72 ~  labs 5/10 on Synth88 showed TSH= 0.49 ~  labs 5/11 on Synth88 showed TSH= 0.28... rec> decr dose 20mcg/d (she is concerned about "facial hair") ~  labs 11/11 on Synthroid 64mcg/d showed TSH= 3.92... keep same. ~  Labs 5/12 on Synth75 showed TSH= 1.89 ~  Labs 5/13 on Synth75 showed TSH= 2.34 ~  Labs 6/14 on Synth75 showed TSH= 2.97  DIVERTICULOSIS OF COLON (ICD-562.10) - there is a +fam hx of colon cancer in her father... ~  last colonoscopy 8/06 by DrPatterson showed divertics, hems... f/u 104yrs. ~  F/u colonoscopy 6/12 by DrPatterson showed divertics, otherw neg...  LLQ/ Left INGUINAL PAIN >>  ~  6/14:  she presented w/  LLQ/ pelvic/ inguinal pain x several months- she is rather vague about it (4-5/10, present all the time but worse when up & about, better when sitting or lying down, Tramadol helps ease the pain); she saw DrHaygood, GYN for eval- we don't have notes but they did MRI Pelvis 4/14 showing uterine fibroid, no adnexal masses, fat containing right inguinal hernia; they didn't think the fibroid was the cause of her pain; she also saw DrGioffre, Ortho- we don't have his note but Epic has CT left hip which is neg x for mild degen change, uterine fibroid, diverticulosis; she has  seen DrPatterson, GI in the past for colonoscopies- last 6/12 showed only divertics, otherw neg...   We reviewed recent labs from [facility] & rec checking additional labs & CT Abd & Pelvis for completeness; if all this is neg she indicates that she would like f/u appt w/ DrPattetrson for his input... CT Abd & Pelvis=> incr stool  burden, no adenop, scat divertics, 7cm uterine fibroid; Incidental findings included 4mm right lung basilar nodule, elev right hemidiaph, cardiomeg, coronary calcif, pancreatic atrophy, s/pGB, bilat renal cysts,marked lumbar spondylosis... Pain resolved w/ laxatives.  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 ~  She continues periodic f/u evals by DrWakefield, DrMoody, & DrKhan==> on observation alone now... 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 ~  6/14: she had yearly f/u DrKhan- DCIS left breast, s/p lumpectomy 8/11, then XRT, pt refused Tamoxifen; stable on observation- neg exam, reminded to get her mammograms...  DEGENERATIVE JOINT DISEASE (ICD-715.90) & HIP PAIN (ICD-719.45) - she takes calcium & vitamins (including "Nature's Code which has 1,200 u Vit D)... prev on Celebrex & Tramadol but uses OTC meds now as needed... she tells me she had a BMD at [facility]Bertrand[facility] in 2008 per her GYN. ~  12/10: presents w/ right hip pain> XRays neg; Rx w/ Dosepak, Mobic, rest, heat... ~  11/11:  c/o left hip pain & XRay showed mild degen changes> Rx MOBIC 7.5mg  Prn... ~  11/14: she is stable on Tramadol & Tylenol for as needed use...  BACK PAIN, LUMBAR (ICD-724.2) - she is s/p lumbar laminectomy L3-4 & L4-5 6/07 by DrGioffre for spinal stenosis... ~  Lower spine degen changes seen on f/u XRays... ~  11/13: she presented w/ severe LBP & leg symptoms w/ severe spinal stenosis L2-3; DrGioffre/Beane did decompressive LLam L2-3 & decompression of nerve roots at L2 & L3 bilaterally w/ foraminotomies...  OSTEOPENIA (ICD-733.90) - she had a normal BMD in 2008- at [facility]  w/ TScores +0.5 in spine & +0.6 in left FemNeck... Vit D level was 24 May09 and started on 50,000 u weekly... f/u Vit D level 11/09 = 47 & switched to OTC Vit D supplement. ~  labs 5/11 showed Vit D level = 29... rec incr VIt D to 2000 u daily. ~  She is encouraged to get a follow up BMD when she has her next mammogram & will let us know...  PARKINSON'S DISEASE (ICD-332.0) - she has mod severe Parkinson's dis followed by DrPenumalli & DrStacey at [facility] on STALEVO 150mg - 5 times daily... she has been doing well & very stable on this medication (hx of hallucinations on the 200mg  tabs)... ~  5/11: she requests referral to [facility] for f/u Parkinson's> seen by DrPenumalli & no changes made. ~  11/12:  She reports recent eval by DrPenumali for hallucinations> MRI showed mild atrophy & sm vessel ischemic dis & they decr her Stavelo from 5 to 4/d... ~  3/14:  MRI Brain at [facility] showed mod atrophy & sm vessel ischemic dis, otherw neg/ no changes... ~  5/14: she had f/u eval DrPenumalli> on Stalevo Qid now & the reduced dose has decr her hallucinations; hand-writing  is worse & short term memory are worse; Seroquel did not help; he rec continue same...  ANXIETY (ICD-300.00) - prev on Prn Valium but off this medication now...  SEBACEOUS CYST>  She mentioned a "mole" on her back during 5/12 OV & exam revealed a mod sized seb cyst; rec to ask DrWakfield to excise this at his convenience==> removed 9/12.   Past Surgical History  Procedure Laterality Date  . Ovarian cyst removal    . Lapraoscopic cholecystectomy  09/2003    Dr. Derrell Lolling  . Lumbar laminectomy and foraminotomies for spinal stenosis  11/2005    Dr. Darrelyn Hillock  . Cataract surgery and argon laser trabeculoplasty  04/2010    Dr. Elmer Picker  . Colonoscopy    . Polypectomy    . Cholecystectomy    . Breast lumpectomy  8/11 by DrWakefield  . Incise and drain abcess  10/04/11    sebaceous cyst on back  . Lumbar laminectomy/decompression  microdiscectomy  05/20/2012    Procedure: LUMBAR LAMINECTOMY/DECOMPRESSION MICRODISCECTOMY 2 LEVELS;  Surgeon: Jacki Cones, MD;  Location: [facility];  Service: Orthopedics;  Laterality: N/A;    Outpatient Encounter Prescriptions as of 06/17/2013  Medication Sig  . aspirin 81 MG tablet Take 81 mg by mouth daily.    . Calcium Carbonate-Vitamin D (CALTRATE 600+D) 600-400 MG-UNIT per tablet Take 1 tablet by mouth 2 (two) times daily.    . carbidopa-levodopa-entacapone (STALEVO 100) 25-100-200 MG per tablet Take 1 tablet by mouth 4 (four) times daily.  . Cholecalciferol (VITAMIN D) 2000 UNITS CAPS Take 1 capsule by mouth daily.    . clopidogrel (PLAVIX) 75 MG tablet TAKE 1 TABLET EVERY DAY  . cycloSPORINE (RESTASIS) 0.05 % ophthalmic emulsion Place 1 drop into both eyes 2 (two) times daily.    Marland Kitchen levothyroxine (SYNTHROID, LEVOTHROID) 75 MCG tablet Take 1 tablet (75 mcg total) by mouth daily.  Marland Kitchen lisinopril (PRINIVIL,ZESTRIL) 10 MG tablet TAKE 1 TABLET EVERY DAY  . Multiple Vitamins-Minerals (WOMENS MULTIVITAMIN PLUS PO) Take 1 tablet by mouth daily.    . polyethylene glycol (MIRALAX / GLYCOLAX) packet Take 17 g by mouth daily as needed.  . traMADol (ULTRAM) 50 MG tablet Take 1 tablet by mouth as needed.    No Known Allergies   Current Medications, Allergies, Past Medical History, Past Surgical History, Family History, and Social History were reviewed in [employer] record.   Review of Systems        See HPI - all other systems neg except as noted... The patient complains of dyspnea on exertion, muscle weakness, and difficulty walking.  The patient denies anorexia, fever, weight loss, weight gain, vision loss, decreased hearing, hoarseness, chest pain, syncope, peripheral edema, prolonged cough, headaches, hemoptysis, abdominal pain, melena, hematochezia, severe indigestion/heartburn, hematuria, incontinence, suspicious skin lesions, transient blindness, depression,  unusual weight change, abnormal bleeding, enlarged lymph nodes, and angioedema.     Objective:   Physical Exam    WD, WN, 72 y/o BF in NAD.Marland KitchenMarland Kitchen slow moving but only min tremor from Parkinsons... GENERAL:  Alert & oriented; pleasant & cooperative... HEENT:  [place]/AT, EOM-wnl, PERRLA, EACs-clear, TMs-wnl, NOSE-clear, THROAT-clear & wnl. NECK:  Supple w/ fairROM; no JVD; normal carotid impulses w/o bruits; no thyromegaly or nodules palpated; no lymphadenopathy. CHEST:  Clear to P & A; without wheezes/ rales/ or rhonchi. HEART:  Regular Rhythm; without murmurs/ rubs/ or gallops. ABDOMEN:  Soft & min tender left groin; normal bowel sounds; no organomegaly or masses detected. EXT: without deformities, mod  arthritic changes; no varicose veins/ +venous insuffic/ no edema. fairly good ROM in hips etc... NEURO:  CN's intact; min tremor and bradykinesia of Parkinson's; no focal neuro deficits... DERM:  No lesions noted; no rash etc...  RADIOLOGY DATA:  Reviewed in the EPIC EMR & discussed w/ the patient...  LABORATORY DATA:  Reviewed in the EPIC EMR & discussed w/ the patient...   Assessment & Plan:     HBP>  Stable on Lisinopril10, tolerating well, continue same...  ASPVD>  Stable on ASA/ Plavix & denies cerebral ischemic symptoms, she does not want to change meds...  CHOL>  Her lipids are good on diet alone; continue diet efforts...  DM>  Borderline at worst, her BS & A1c has been normal x yrs...  Hypothyroidism>  Clinically stable on 90mcg/d...  GI>  Follow up colon was neg & she is stable... rec to take Miralax regularly for constip...  BREAST CANCER>  She had left breast DCIS excised 8/11 w/ post op XRT as noted; refused Tamoxifen, followed by Andi Hence...  DJD, LBP>  She is s/p LLam- tells me she still managed quite satis on OTC analgesics...  Osteopenia>  Vit D level is 31 & she is rec totake 2000u extra every day; we will sched BMD here...  Parkinsons>  She is stable w/ mod severe  dis on the Stalevo Rx but has hallucinations and needs to f/u w/ Neuro... They also swtarted Exelon patches...   Patient's Medications  New Prescriptions   No medications on file  Previous Medications   ASPIRIN 81 MG TABLET    Take 81 mg by mouth daily.     CALCIUM CARBONATE-VITAMIN D (CALTRATE 600+D) 600-400 MG-UNIT PER TABLET    Take 1 tablet by mouth 2 (two) times daily.     CARBIDOPA-LEVODOPA-ENTACAPONE (STALEVO 100) 25-100-200 MG PER TABLET    Take 1 tablet by mouth 4 (four) times daily.   CHOLECALCIFEROL (VITAMIN D) 2000 UNITS CAPS    Take 1 capsule by mouth daily.     CLOPIDOGREL (PLAVIX) 75 MG TABLET    TAKE 1 TABLET EVERY DAY   CYCLOSPORINE (RESTASIS) 0.05 % OPHTHALMIC EMULSION    Place 1 drop into both eyes 2 (two) times daily.     LEVOTHYROXINE (SYNTHROID, LEVOTHROID) 75 MCG TABLET    Take 1 tablet (75 mcg total) by mouth daily.   LISINOPRIL (PRINIVIL,ZESTRIL) 10 MG TABLET    TAKE 1 TABLET EVERY DAY   MULTIPLE VITAMINS-MINERALS (WOMENS MULTIVITAMIN PLUS PO)    Take 1 tablet by mouth daily.     POLYETHYLENE GLYCOL (MIRALAX / GLYCOLAX) PACKET    Take 17 g by mouth daily as needed.   TRAMADOL (ULTRAM) 50 MG TABLET    Take 1 tablet by mouth as needed.  Modified Medications   No medications on file  Discontinued Medications   No medications on file   Review of Systems    Physical Exam

## 2013-06-18 LAB — URINE CULTURE
Colony Count: NO GROWTH
Organism ID, Bacteria: NO GROWTH

## 2013-06-28 ENCOUNTER — Encounter: Payer: Self-pay | Admitting: Pulmonary Disease

## 2013-07-06 ENCOUNTER — Telehealth: Payer: Self-pay | Admitting: Diagnostic Neuroimaging

## 2013-07-06 NOTE — Telephone Encounter (Signed)
Patient's husband called to state that ever since patient started Exelon patches on 06/30/13, patient has been seeing things a lot more frequently, hallucinations. Patient's husband states she will set the table for 2 extra spaces saying it's for their kids and there are no kids at the house. Please call the patient's husband, he is concerned and wants to know if he can stop giving her the patches.

## 2013-07-06 NOTE — Telephone Encounter (Signed)
Call patient. Yes ok to stop patches. -VRP

## 2013-07-08 NOTE — Telephone Encounter (Signed)
Pts husband here for appt with Dr. Krista Blue.   Had not heard back re: exelon patches.   I went to room 5, relayed that Dr. Leta Baptist had responded to message, and it was ok to stop the patches.  Last dose was last night.  He will stop today and then let us know if still problems.  He verbalized understanding.

## 2013-07-31 ENCOUNTER — Other Ambulatory Visit: Payer: Self-pay | Admitting: Pulmonary Disease

## 2013-09-01 ENCOUNTER — Ambulatory Visit (INDEPENDENT_AMBULATORY_CARE_PROVIDER_SITE_OTHER): Payer: Medicare Other | Admitting: Internal Medicine

## 2013-09-01 ENCOUNTER — Other Ambulatory Visit (INDEPENDENT_AMBULATORY_CARE_PROVIDER_SITE_OTHER): Payer: Medicare Other

## 2013-09-01 ENCOUNTER — Encounter: Payer: Self-pay | Admitting: Internal Medicine

## 2013-09-01 VITALS — BP 110/60 | HR 86 | Temp 98.4°F | Resp 16 | Ht 60.0 in | Wt 116.2 lb

## 2013-09-01 DIAGNOSIS — E039 Hypothyroidism, unspecified: Secondary | ICD-10-CM

## 2013-09-01 DIAGNOSIS — I10 Essential (primary) hypertension: Secondary | ICD-10-CM

## 2013-09-01 DIAGNOSIS — E559 Vitamin D deficiency, unspecified: Secondary | ICD-10-CM

## 2013-09-01 DIAGNOSIS — Z23 Encounter for immunization: Secondary | ICD-10-CM | POA: Diagnosis not present

## 2013-09-01 DIAGNOSIS — E7800 Pure hypercholesterolemia, unspecified: Secondary | ICD-10-CM

## 2013-09-01 DIAGNOSIS — R7309 Other abnormal glucose: Secondary | ICD-10-CM

## 2013-09-01 LAB — CBC WITH DIFFERENTIAL/PLATELET
BASOS ABS: 0 10*3/uL (ref 0.0–0.1)
Basophils Relative: 0.7 % (ref 0.0–3.0)
EOS PCT: 0.5 % (ref 0.0–5.0)
Eosinophils Absolute: 0 10*3/uL (ref 0.0–0.7)
HEMATOCRIT: 40.1 % (ref 36.0–46.0)
HEMOGLOBIN: 13.4 g/dL (ref 12.0–15.0)
LYMPHS ABS: 0.8 10*3/uL (ref 0.7–4.0)
LYMPHS PCT: 21.7 % (ref 12.0–46.0)
MCHC: 33.4 g/dL (ref 30.0–36.0)
MCV: 93 fl (ref 78.0–100.0)
MONOS PCT: 4.5 % (ref 3.0–12.0)
Monocytes Absolute: 0.2 10*3/uL (ref 0.1–1.0)
NEUTROS ABS: 2.8 10*3/uL (ref 1.4–7.7)
Neutrophils Relative %: 72.6 % (ref 43.0–77.0)
PLATELETS: 189 10*3/uL (ref 150.0–400.0)
RBC: 4.31 Mil/uL (ref 3.87–5.11)
RDW: 13.2 % (ref 11.5–14.6)
WBC: 3.8 10*3/uL — AB (ref 4.5–10.5)

## 2013-09-01 LAB — COMPREHENSIVE METABOLIC PANEL
ALT: 5 U/L (ref 0–35)
AST: 16 U/L (ref 0–37)
Albumin: 4 g/dL (ref 3.5–5.2)
Alkaline Phosphatase: 60 U/L (ref 39–117)
BILIRUBIN TOTAL: 1.1 mg/dL (ref 0.3–1.2)
BUN: 15 mg/dL (ref 6–23)
CALCIUM: 9.3 mg/dL (ref 8.4–10.5)
CHLORIDE: 106 meq/L (ref 96–112)
CO2: 30 meq/L (ref 19–32)
CREATININE: 0.9 mg/dL (ref 0.4–1.2)
GFR: 80.59 mL/min (ref >60.00)
GLUCOSE: 112 mg/dL — AB (ref 70–99)
Potassium: 4 mEq/L (ref 3.5–5.1)
Sodium: 142 mEq/L (ref 135–145)
Total Protein: 6.6 g/dL (ref 6.0–8.3)

## 2013-09-01 LAB — HEMOGLOBIN A1C: Hgb A1c MFr Bld: 6 % (ref 4.6–6.5)

## 2013-09-01 LAB — LIPID PANEL
CHOLESTEROL: 166 mg/dL (ref 0–200)
HDL: 64.5 mg/dL (ref >39.00)
LDL Cholesterol: 96 mg/dL (ref 0–99)
TRIGLYCERIDES: 26 mg/dL (ref 0.0–149.0)
Total CHOL/HDL Ratio: 3
VLDL: 5.2 mg/dL (ref 0.0–40.0)

## 2013-09-01 LAB — TSH: TSH: 1.15 u[IU]/mL (ref 0.35–5.50)

## 2013-09-01 MED ORDER — VITAMIN D 50 MCG (2000 UT) PO CAPS: 1.0000 | ORAL_CAPSULE | Freq: Every day | ORAL | Status: DC

## 2013-09-01 NOTE — Patient Instructions (Signed)

## 2013-09-01 NOTE — Progress Notes (Signed)
Subjective:    Patient ID: [name], female    DOB: 1941-03-05, 73 y.o.   MRN: [id number]  Hypertension This is a chronic problem. The current episode started more than 1 year ago. The problem has been gradually improving since onset. The problem is controlled. Associated symptoms include malaise/fatigue. Pertinent negatives include no anxiety, blurred vision, chest pain, headaches, neck pain, orthopnea, palpitations, peripheral edema, PND, shortness of breath or sweats. (Orthostatic dizziness and low blood pressure) There are no associated agents to hypertension. Past treatments include ACE inhibitors. The current treatment provides significant improvement. There are no compliance problems.  Hypertensive end-organ damage includes a thyroid problem.      Review of Systems  Constitutional: Positive for malaise/fatigue and fatigue. Negative for fever, chills, diaphoresis, activity change, appetite change and unexpected weight change.  HENT: Negative.   Eyes: Negative.  Negative for blurred vision.  Respiratory: Negative.  Negative for cough, choking, chest tightness, shortness of breath, wheezing and stridor.   Cardiovascular: Negative.  Negative for chest pain, palpitations, orthopnea, leg swelling and PND.  Gastrointestinal: Positive for constipation. Negative for nausea, vomiting, abdominal pain and diarrhea.  Endocrine: Negative.   Genitourinary: Negative.  Negative for dysuria, hematuria and difficulty urinating.  Musculoskeletal: Negative.  Negative for arthralgias, back pain, gait problem, neck pain and neck stiffness.  Skin: Negative.   Allergic/Immunologic: Negative.   Neurological: Positive for dizziness and light-headedness. Negative for tremors, syncope, speech difficulty, weakness, numbness and headaches.  Hematological: Negative.  Negative for adenopathy. Does not bruise/bleed easily.  Psychiatric/Behavioral: Positive for confusion and decreased concentration. Negative for  suicidal ideas, hallucinations, behavioral problems, sleep disturbance, self-injury, dysphoric mood and agitation. The patient is not nervous/anxious and is not hyperactive.        Objective:   Physical Exam  Vitals reviewed. Constitutional: She is oriented to person, place, and time. She appears well-developed and well-nourished. No distress.  HENT:  Head: Normocephalic and atraumatic.  Mouth/Throat: Oropharynx is clear and moist. No oropharyngeal exudate.  Eyes: Conjunctivae are normal. Right eye exhibits no discharge. Left eye exhibits no discharge. No scleral icterus.  Neck: Normal range of motion. Neck supple. No JVD present. No tracheal deviation present. No thyromegaly present.  Cardiovascular: Normal rate, regular rhythm, normal heart sounds and intact distal pulses.  Exam reveals no gallop and no friction rub.   No murmur heard. Pulmonary/Chest: Effort normal and breath sounds normal. No stridor. No respiratory distress. She has no wheezes. She has no rales. She exhibits no tenderness.  Abdominal: Soft. Bowel sounds are normal. She exhibits no distension and no mass. There is no tenderness. There is no rebound and no guarding.  Musculoskeletal: Normal range of motion. She exhibits no edema and no tenderness.  Lymphadenopathy:    She has no cervical adenopathy.  Neurological: She is oriented to person, place, and time.  Skin: Skin is warm and dry. No rash noted. She is not diaphoretic. No erythema. No pallor.  Psychiatric: She has a normal mood and affect. Judgment and thought content normal. Her mood appears not anxious. Her affect is not angry. Her speech is delayed and tangential. Her speech is not rapid and/or pressured and not slurred. She is slowed and withdrawn. She is not agitated, not aggressive, not hyperactive, not actively hallucinating and not combative. Thought content is not paranoid and not delusional. Cognition and memory are impaired. She does not exhibit a depressed  mood. She expresses no homicidal and no suicidal ideation. She expresses no suicidal plans and  no homicidal plans. She is communicative. She exhibits abnormal recent memory and abnormal remote memory. She is inattentive.     Lab Results  Component Value Date   WBC 4.3* 06/17/2013   HGB 13.3 06/17/2013   HCT 40.4 06/17/2013   PLT 202.0 06/17/2013   GLUCOSE 100* 06/17/2013   CHOL 145 05/20/2012   TRIG 60 05/20/2012   HDL 64 05/20/2012   LDLDIRECT 142.2 10/28/2008   LDLCALC 69 05/20/2012   ALT <6 Repeated and Verified 12/08/2012   AST 12 12/08/2012   NA 141 06/17/2013   K 4.1 06/17/2013   CL 106 06/17/2013   CREATININE 0.7 06/17/2013   BUN 14 06/17/2013   CO2 29 06/17/2013   TSH 2.97 12/19/2012   INR 1.10 05/19/2012   HGBA1C 5.9 12/19/2012       Assessment & Plan:

## 2013-09-01 NOTE — Assessment & Plan Note (Signed)
Her BP is low and she is symptomatic so I have advised her to stop taking the ACEI

## 2013-09-01 NOTE — Assessment & Plan Note (Signed)
I will recheck her A1C to see if she has developed DM2  

## 2013-09-01 NOTE — Assessment & Plan Note (Signed)
I will recheck her TSH and will adjust her dose if needed 

## 2013-09-01 NOTE — Assessment & Plan Note (Signed)
The software blocked my order for a Vit D level I have asked her to be certain that she is taking the recommended supplement

## 2013-09-01 NOTE — Assessment & Plan Note (Signed)
FLP today 

## 2013-09-01 NOTE — Progress Notes (Signed)
Pre visit review using our clinic review tool, if applicable. No additional management support is needed unless otherwise documented below in the visit note. 

## 2013-09-11 ENCOUNTER — Other Ambulatory Visit: Payer: Self-pay | Admitting: Pulmonary Disease

## 2013-10-06 DIAGNOSIS — H4011X0 Primary open-angle glaucoma, stage unspecified: Secondary | ICD-10-CM | POA: Diagnosis not present

## 2013-10-06 DIAGNOSIS — H409 Unspecified glaucoma: Secondary | ICD-10-CM | POA: Diagnosis not present

## 2013-11-02 ENCOUNTER — Other Ambulatory Visit: Payer: Self-pay | Admitting: Diagnostic Neuroimaging

## 2013-12-01 ENCOUNTER — Telehealth: Payer: Self-pay | Admitting: Hematology and Oncology

## 2013-12-01 NOTE — Telephone Encounter (Signed)
, °

## 2013-12-07 ENCOUNTER — Other Ambulatory Visit: Payer: Medicare Other

## 2013-12-07 ENCOUNTER — Telehealth: Payer: Self-pay | Admitting: *Deleted

## 2013-12-07 ENCOUNTER — Ambulatory Visit: Payer: Medicare Other | Admitting: Oncology

## 2013-12-07 NOTE — Telephone Encounter (Signed)
Called and line busy , will try again for more information for reasons why she thinks spouse may have dementia

## 2013-12-08 NOTE — Telephone Encounter (Signed)
Called and scheduled sooner appt with Dr Leta Baptist, confirmed with daughter(Cheryl)

## 2013-12-08 NOTE — Telephone Encounter (Signed)
Left message with patient's husband call back to schedule appt, was suppose to f/u with Dr Leta Baptist for yearly

## 2013-12-08 NOTE — Telephone Encounter (Signed)
Spoke with husband and he said that patient has gone to a different level,doesn's know him, was taking too much medicine and the patches seem to have made things worse, no longer using them. He is now making sure that she is getting correct dosage of her parkinson's medicine, would rather see physician but would like a sooner appt than August with Dr Leta Baptist, may also contact daughter if he cannot be reached at : [phone]

## 2013-12-08 NOTE — Telephone Encounter (Signed)
Ok to schedule in AM. -VRP

## 2013-12-11 ENCOUNTER — Ambulatory Visit (INDEPENDENT_AMBULATORY_CARE_PROVIDER_SITE_OTHER): Payer: Medicare Other | Admitting: Diagnostic Neuroimaging

## 2013-12-11 ENCOUNTER — Encounter: Payer: Self-pay | Admitting: Diagnostic Neuroimaging

## 2013-12-11 VITALS — BP 116/56 | HR 59 | Temp 97.8°F | Ht 60.0 in | Wt 110.0 lb

## 2013-12-11 DIAGNOSIS — R6889 Other general symptoms and signs: Secondary | ICD-10-CM | POA: Diagnosis not present

## 2013-12-11 DIAGNOSIS — R443 Hallucinations, unspecified: Secondary | ICD-10-CM | POA: Diagnosis not present

## 2013-12-11 DIAGNOSIS — F03A0 Unspecified dementia, mild, without behavioral disturbance, psychotic disturbance, mood disturbance, and anxiety: Secondary | ICD-10-CM

## 2013-12-11 DIAGNOSIS — F0390 Unspecified dementia without behavioral disturbance: Secondary | ICD-10-CM

## 2013-12-11 DIAGNOSIS — G20 Parkinson's disease: Secondary | ICD-10-CM

## 2013-12-11 NOTE — Patient Instructions (Signed)
I will check lab testing. 

## 2013-12-11 NOTE — Progress Notes (Addendum)
[facility]  PATIENT: [name] DOB: 07/29/1940  REFERRING CLINICIAN:  HISTORY FROM: patient and daughter REASON FOR VISIT: follow up   HISTORICAL  CHIEF COMPLAINT:  Chief Complaint  Patient presents with  . Follow-up    PD, memory    HISTORY OF PRESENT ILLNESS:   UPDATE 12/11/13: Since last visit, was stable until 3 weeks ago. Now with more confusion, intermittent, sometimes not recognizing her family. Sometimes [place], even though she is standing in her home. No fevers, chills, cough, diarrhea, pain, SOB, CP, N, V. Non threatening visual hallucinations are stable. Memory worsening. Could not tolerate exelon patch (slightly paranoid about its effects).   UPDATE 10/30/12: Since last visit patient is stable. Reduced dosing of Stalevo seems to have improved her hallucinations, but her handwriting has worsened. She still sees children and people in her home, but according to the husband this is much less frequent. He has had to adjust some of the objects in his home which were bothering her. For example plants or water heaters high up in the room, or radiators on the floor, seem to bother her more. Once he removed these from her view she felt better. Tremor, gait and balance are stable. No falls. She does have more stooped posture than before but this seems to be stable since her back surgery. She is having some or drooling. She's having more runny nose. She keeps a napkin with her to wipe her nose. Patient's husband reports more short-term memory loss than before.  UPDATE 09/05/12: Since last visit, has reduced stalevo to QID. no change in hallucinations or parkinson's symptoms. until 1 month ago, some progression of confusion, memory, hallucinations and paranoia. this fluctuates throughout the day.  UPDATE 07/07/12: Since last visit, tried seroquel x 2 weeks, but hallucinations worsened. Now off seroquel. Also had low back surgery  (nov 2013), and went to rehab. Now back home. Stalevo dosing stable.  UPDATE 04/16/12: Never picked up seroquel after last visit. she didn't know that i rx'd this, even though we discussed it. will try rx again. hallucinations are stable. some more stiffness in hips lately. on 5 tabs stalevo per day.  UPDATE 08/14/11:  Continues to have hallucinations, mostly at night, denies being afraid but feels they are bothersome.  Has not thought about calling 911.  Realizes they are hallucinations.  Took Stalevo 4 times per day for one week without improvement of hallucinations, no change in parkinson's symptoms nowback up to 5 times a day.  She is interested in trying other medications for relief.  Also verbalizes concerns of choking episodes in her sleep.  Occurs approximately 3-4 times per month.  Denies choking episodes with eating or drinking.  PRIOR HPI: 73 year old female with history of hypothyroidism, hypertension, peripheral vascular disease presenting for evaluation and management of Parkinson's disease.  She is accompanied by her husband. In 2004 patient began to develop tremor in her bilateral upper extremities. She was diagnosed with Parkinson's disease. She was initially treated with Mirapex and Sinemet by Dr. Jacolyn Reedy ([place]).  She began to develop hallucinations and was ultimately changed to Stalevo by Dr. Marzetta Board [facility]).  She has done fairly well on this medication. She takes Stalevo 37.5/150/200, 1 tab five times a day (7am, 11am, 3pm, 7pm, bedtime).  She notices wearing off around 4 hours after her dose. She has not had any falls. She denies hallucinations or constipation.  She would like to re-establish  care with a local neurologist.   REVIEW OF SYSTEMS: Full 14 system review of systems performed and notable only for memory loss decreased energy hallucinations confusion neck stiffness body stiffness runny nose.  ALLERGIES: No Known Allergies  HOME MEDICATIONS: Outpatient Prescriptions  Prior to Visit  Medication Sig Dispense Refill  . aspirin 81 MG tablet Take 81 mg by mouth daily.        . Calcium Carbonate-Vitamin D (CALTRATE 600+D) 600-400 MG-UNIT per tablet Take 1 tablet by mouth 2 (two) times daily.        . carbidopa-levodopa-entacapone (STALEVO) 25-100-200 MG per tablet TAKE 1 TABLET BY MOUTH 4 (FOUR) TIMES DAILY.  120 tablet  0  . Cholecalciferol (VITAMIN D) 2000 UNITS CAPS Take 1 capsule (2,000 Units total) by mouth daily.  90 capsule  3  . clopidogrel (PLAVIX) 75 MG tablet TAKE 1 TABLET (75 MG TOTAL) BY MOUTH DAILY.  60 tablet  4  . cycloSPORINE (RESTASIS) 0.05 % ophthalmic emulsion Place 1 drop into both eyes 2 (two) times daily.        Marland Kitchen levothyroxine (SYNTHROID, LEVOTHROID) 75 MCG tablet TAKE 1 TABLET (75 MCG TOTAL) BY MOUTH DAILY.  60 tablet  5  . Multiple Vitamins-Minerals (WOMENS MULTIVITAMIN PLUS PO) Take 1 tablet by mouth daily.        . polyethylene glycol (MIRALAX / GLYCOLAX) packet Take 17 g by mouth daily as needed.  14 each  0  . traMADol (ULTRAM) 50 MG tablet Take 1 tablet by mouth as needed.      . rivastigmine (EXELON) 4.6 mg/24hr Place 1 patch (4.6 mg total) onto the skin daily.  30 patch  6  . clopidogrel (PLAVIX) 75 MG tablet TAKE 1 TABLET EVERY DAY  60 tablet  5   No facility-administered medications prior to visit.    PAST MEDICAL HISTORY: Past Medical History  Diagnosis Date  . Glaucoma   . Abnormal chest x-ray   . Hypertension   . Peripheral vascular disease   . Venous insufficiency   . Hypercholesteremia   . Hypothyroid   . Diverticulosis of colon   . DJD (degenerative joint disease)   . Hip pain   . Lumbar back pain   . Osteopenia   . Vitamin D deficiency   . Parkinson disease   . Anxiety   . Breast cancer     stage 0 left    PAST SURGICAL HISTORY: Past Surgical History  Procedure Laterality Date  . Ovarian cyst removal    . Lapraoscopic cholecystectomy  09/2003    Dr. Dalbert Batman  . Lumbar laminectomy and foraminotomies  for spinal stenosis  11/2005    Dr. Gladstone Lighter  . Cataract surgery and argon laser trabeculoplasty  04/2010    Dr. Herbert Deaner  . Colonoscopy    . Polypectomy    . Cholecystectomy    . Breast lumpectomy  8/11 by [place]  . Incise and drain abcess  10/04/11    sebaceous cyst on back  . Lumbar laminectomy/decompression microdiscectomy  05/20/2012    Procedure: LUMBAR LAMINECTOMY/DECOMPRESSION MICRODISCECTOMY 2 LEVELS;  Surgeon: Tobi Bastos, MD;  Location: [facility];  Service: Orthopedics;  Laterality: N/A;    FAMILY HISTORY: Family History  Problem Relation Age of Onset  . Colon cancer Father     SOCIAL HISTORY:  History   Social History  . Marital Status: Married    Spouse Name: [name]    Number of Children: 2  . Years of Education: Assoc  Occupational History  . retired   .     Social History Main Topics  . Smoking status: Never Smoker   . Smokeless tobacco: Never Used  . Alcohol Use: No  . Drug Use: No  . Sexual Activity: Not Currently   Other Topics Concern  . Not on file   Social History Narrative   Pt lives at home with spouse.   Caffeine Use: Very little; once a week     PHYSICAL EXAM  Filed Vitals:   12/11/13 0844  BP: 116/56  Pulse: 59  Temp: 97.8 F (36.6 C)  TempSrc: Oral  Height: 5' (1.524 m)  Weight: 110 lb (49.896 kg)   Body mass index is 21.48 kg/(m^2).  EXAM: General: Patient is awake, alert and in no acute distress.  Well developed and groomed. Neck: Neck is supple. Cardiovascular: No carotid artery bruits.  Heart is regular rate and rhythm with no murmurs.  Neurologic Exam  Mental Status: Awake, alert.  Language has dec fluency. Comprehension intact. PLEASANT, CALM, COOPERATIVE. SOFT SPOKEN. MMSE 20/30. AFT 4. GDS 1. Cranial Nerves: Pupils are equal and reactive to light.  Visual fields are full to confrontation.  Conjugate eye movements are full and symmetric.  Facial sensation and strength are symmetric.  Hearing is intact.  Palate  elevated symmetrically and uvula is midline.  Shoulder shug is symmetric.  Tongue is midline.  MASKED FACIES.  POSITIVE MYERSON'S SIGN. Motor: Normal bulk and MOD COGWHEELING IN BUE. MILD REST TREMOR IN BUE. BRADYKINESIA IN BUE AND BLE (LEFT WORSE THAN RIGHT). Full strength in the upper and lower extremities.  No pronator drift. Sensory: Intact and symmetric to light touch. Coordination: No ataxia or dysmetria on finger-nose or rapid alternating movement testing.  SLOW RAM. Gait and Station: Narrow based gait; SLIGHT STOOPED POSTURE. SLOW. SHORT STEPS. Reflexes: Deep tendon reflexes in the upper and lower extremity are TRACE and symmetric.   DIAGNOSTIC DATA (LABS, IMAGING, TESTING) - I reviewed patient records, labs, notes, testing and imaging myself where available.  Lab Results  Component Value Date   WBC 3.8* 09/01/2013   HGB 13.4 09/01/2013   HCT 40.1 09/01/2013   MCV 93.0 09/01/2013   PLT 189.0 09/01/2013      Component Value Date/Time   NA 142 09/01/2013 0956   NA 142 12/08/2012 0926   K 4.0 09/01/2013 0956   K 4.1 12/08/2012 0926   CL 106 09/01/2013 0956   CL 107 12/08/2012 0926   CO2 30 09/01/2013 0956   CO2 27 12/08/2012 0926   GLUCOSE 112* 09/01/2013 0956   GLUCOSE 123* 12/08/2012 0926   BUN 15 09/01/2013 0956   BUN 15.9 12/08/2012 0926   CREATININE 0.9 09/01/2013 0956   CREATININE 0.8 12/08/2012 0926   CALCIUM 9.3 09/01/2013 0956   CALCIUM 9.1 12/08/2012 0926   PROT 6.6 09/01/2013 0956   PROT 6.1* 12/08/2012 0926   ALBUMIN 4.0 09/01/2013 0956   ALBUMIN 3.4* 12/08/2012 0926   AST 16 09/01/2013 0956   AST 12 12/08/2012 0926   ALT 5 09/01/2013 0956   ALT <6 Repeated and Verified 12/08/2012 0926   ALKPHOS 60 09/01/2013 0956   ALKPHOS 54 12/08/2012 0926   BILITOT 1.1 09/01/2013 0956   BILITOT 0.64 12/08/2012 0926   GFRNONAA >90 05/19/2012 2000   GFRAA >90 05/19/2012 2000   Lab Results  Component Value Date   CHOL 166 09/01/2013   HDL 64.50 09/01/2013   LDLCALC 96 09/01/2013   LDLDIRECT  142.2 10/28/2008  TRIG 26.0 09/01/2013   CHOLHDL 3 09/01/2013   Lab Results  Component Value Date   HGBA1C 6.0 09/01/2013   No results found for this basename: VITAMINB12   Lab Results  Component Value Date   TSH 1.15 09/01/2013    09/09/12 MRI brain - Mild perisylvian and moderate temporal atrophy. Moderate periventricular and subcortical chronic small vessel ischemic disease. No significant change from prior MRI on 05/08/11.   ASSESSMENT AND PLAN  73 y.o. female with history of hypothyroidism, hypertension, peripheral vascular disease presenting for evaluation and management of Parkinson's disease. She takes Stalevo 25/100/200, 1 tab 4 times a day. Non-threatening visual hallucinations are stable. More intermittent confusion in last 3 weeks. Likely represent progression of her underlying neurodegenerative disorder. Will check labs to rule out other causes.  PLAN: 1. Labs to rule out secondary causes of confusion 2. Continue stalevo 25/100/200 4 tabs per day   Orders Placed This Encounter  Procedures  . CBC With differential/Platelet  . Comprehensive metabolic panel  . UA/M w/rflx Culture, Routine   Return in about 6 months (around 06/12/2014).    Penni Bombard, MD 8/75/6433, 2:95 AM Certified in Neurology, Neurophysiology and Neuroimaging  [facility] [address], [place], [place] [address] [phone]

## 2013-12-12 LAB — CBC WITH DIFFERENTIAL
BASOS: 1 %
Basophils Absolute: 0 10*3/uL (ref 0.0–0.2)
EOS: 1 %
Eosinophils Absolute: 0 10*3/uL (ref 0.0–0.4)
HEMATOCRIT: 43 % (ref 34.0–46.6)
HEMOGLOBIN: 13.6 g/dL (ref 11.1–15.9)
IMMATURE GRANS (ABS): 0 10*3/uL (ref 0.0–0.1)
Immature Granulocytes: 0 %
LYMPHS: 26 %
Lymphocytes Absolute: 1 10*3/uL (ref 0.7–3.1)
MCH: 30.3 pg (ref 26.6–33.0)
MCHC: 31.6 g/dL (ref 31.5–35.7)
MCV: 96 fL (ref 79–97)
MONOCYTES: 6 %
Monocytes Absolute: 0.2 10*3/uL (ref 0.1–0.9)
NEUTROS ABS: 2.7 10*3/uL (ref 1.4–7.0)
NEUTROS PCT: 66 %
Platelets: 216 10*3/uL (ref 150–379)
RBC: 4.49 x10E6/uL (ref 3.77–5.28)
RDW: 13.3 % (ref 12.3–15.4)
WBC: 4 10*3/uL (ref 3.4–10.8)

## 2013-12-12 LAB — COMPREHENSIVE METABOLIC PANEL
A/G RATIO: 2.4 (ref 1.1–2.5)
ALBUMIN: 4.7 g/dL (ref 3.5–4.8)
ALT: 7 IU/L (ref 0–32)
AST: 15 IU/L (ref 0–40)
Alkaline Phosphatase: 67 IU/L (ref 39–117)
BUN/Creatinine Ratio: 15 (ref 11–26)
BUN: 14 mg/dL (ref 8–27)
CALCIUM: 9.8 mg/dL (ref 8.7–10.3)
CO2: 28 mmol/L (ref 18–29)
CREATININE: 0.91 mg/dL (ref 0.57–1.00)
Chloride: 104 mmol/L (ref 97–108)
GFR, EST AFRICAN AMERICAN: 71 mL/min/{1.73_m2} (ref >59)
GFR, EST NON AFRICAN AMERICAN: 62 mL/min/{1.73_m2} (ref >59)
GLOBULIN, TOTAL: 2 g/dL (ref 1.5–4.5)
GLUCOSE: 104 mg/dL — AB (ref 65–99)
POTASSIUM: 4.9 mmol/L (ref 3.5–5.2)
Sodium: 145 mmol/L — ABNORMAL HIGH (ref 134–144)
TOTAL PROTEIN: 6.7 g/dL (ref 6.0–8.5)
Total Bilirubin: 0.7 mg/dL (ref 0.0–1.2)

## 2013-12-14 LAB — UA/M W/RFLX CULTURE, ROUTINE
Bilirubin, UA: NEGATIVE
GLUCOSE, UA: NEGATIVE
Nitrite, UA: NEGATIVE
Protein, UA: NEGATIVE
RBC, UA: NEGATIVE
Specific Gravity, UA: 1.025 (ref 1.005–1.030)
UUROB: 0.2 mg/dL (ref 0.0–1.9)
pH, UA: 5.5 (ref 5.0–7.5)

## 2013-12-14 LAB — MICROSCOPIC EXAMINATION

## 2013-12-14 LAB — URINE CULTURE, REFLEX

## 2013-12-17 ENCOUNTER — Ambulatory Visit: Payer: Medicare Other | Admitting: Pulmonary Disease

## 2014-01-11 DIAGNOSIS — R109 Unspecified abdominal pain: Secondary | ICD-10-CM | POA: Diagnosis not present

## 2014-01-11 DIAGNOSIS — Z01419 Encounter for gynecological examination (general) (routine) without abnormal findings: Secondary | ICD-10-CM | POA: Diagnosis not present

## 2014-01-11 DIAGNOSIS — N950 Postmenopausal bleeding: Secondary | ICD-10-CM | POA: Diagnosis not present

## 2014-01-17 ENCOUNTER — Other Ambulatory Visit: Payer: Self-pay | Admitting: Diagnostic Neuroimaging

## 2014-01-18 MED ORDER — CARBIDOPA-LEVODOPA-ENTACAPONE 25-100-200 MG PO TABS: ORAL_TABLET | ORAL | Status: DC

## 2014-01-20 DIAGNOSIS — D259 Leiomyoma of uterus, unspecified: Secondary | ICD-10-CM | POA: Diagnosis not present

## 2014-01-20 DIAGNOSIS — N950 Postmenopausal bleeding: Secondary | ICD-10-CM | POA: Diagnosis not present

## 2014-02-15 ENCOUNTER — Ambulatory Visit (INDEPENDENT_AMBULATORY_CARE_PROVIDER_SITE_OTHER)
Admission: RE | Admit: 2014-02-15 | Discharge: 2014-02-15 | Disposition: A | Discharge status: Home / Self Care | Payer: Medicare Other | Source: Ambulatory Visit | Attending: Internal Medicine | Admitting: Internal Medicine

## 2014-02-15 ENCOUNTER — Encounter: Payer: Self-pay | Admitting: Internal Medicine

## 2014-02-15 ENCOUNTER — Other Ambulatory Visit (INDEPENDENT_AMBULATORY_CARE_PROVIDER_SITE_OTHER): Payer: Medicare Other

## 2014-02-15 ENCOUNTER — Ambulatory Visit (INDEPENDENT_AMBULATORY_CARE_PROVIDER_SITE_OTHER): Payer: Medicare Other | Admitting: Internal Medicine

## 2014-02-15 VITALS — BP 122/70 | HR 75 | Temp 98.5°F | Wt 106.1 lb

## 2014-02-15 DIAGNOSIS — F0390 Unspecified dementia without behavioral disturbance: Secondary | ICD-10-CM

## 2014-02-15 DIAGNOSIS — M25559 Pain in unspecified hip: Secondary | ICD-10-CM | POA: Diagnosis not present

## 2014-02-15 DIAGNOSIS — M79604 Pain in right leg: Secondary | ICD-10-CM

## 2014-02-15 DIAGNOSIS — G20 Parkinson's disease: Secondary | ICD-10-CM | POA: Diagnosis not present

## 2014-02-15 DIAGNOSIS — M79609 Pain in unspecified limb: Secondary | ICD-10-CM

## 2014-02-15 DIAGNOSIS — T148XXA Other injury of unspecified body region, initial encounter: Secondary | ICD-10-CM

## 2014-02-15 DIAGNOSIS — S79919A Unspecified injury of unspecified hip, initial encounter: Secondary | ICD-10-CM | POA: Diagnosis not present

## 2014-02-15 LAB — CBC WITH DIFFERENTIAL/PLATELET
BASOS PCT: 0.3 % (ref 0.0–3.0)
Basophils Absolute: 0 10*3/uL (ref 0.0–0.1)
Eosinophils Absolute: 0 10*3/uL (ref 0.0–0.7)
Eosinophils Relative: 0.4 % (ref 0.0–5.0)
HEMATOCRIT: 36 % (ref 36.0–46.0)
HEMOGLOBIN: 11.8 g/dL — AB (ref 12.0–15.0)
LYMPHS PCT: 22.2 % (ref 12.0–46.0)
Lymphs Abs: 1.3 10*3/uL (ref 0.7–4.0)
MCHC: 32.8 g/dL (ref 30.0–36.0)
MCV: 93.7 fl (ref 78.0–100.0)
MONOS PCT: 6.8 % (ref 3.0–12.0)
Monocytes Absolute: 0.4 10*3/uL (ref 0.1–1.0)
NEUTROS ABS: 4.1 10*3/uL (ref 1.4–7.7)
Neutrophils Relative %: 70.3 % (ref 43.0–77.0)
Platelets: 228 10*3/uL (ref 150.0–400.0)
RBC: 3.84 Mil/uL — AB (ref 3.87–5.11)
RDW: 13.4 % (ref 11.5–15.5)
WBC: 5.8 10*3/uL (ref 4.0–10.5)

## 2014-02-15 NOTE — Patient Instructions (Signed)
Use an anti-inflammatory cream such as Aspercreme or Zostrix cream twice a day to the affected area as needed. In lieu of this warm moist compresses or  hot water bottle can be used. Do not apply ice . 

## 2014-02-15 NOTE — Progress Notes (Signed)
   Subjective:    Patient ID: [name], female    DOB: 1941-05-10, 73 y.o.   MRN: [id number]  HPI   She sustained a mechanical fall 02/08/14 w/o associated neuro /cariao prodrome. She's continued to have pain in the right upper leg from the hip to thigh. This is most striking in the medial thigh.Falling has increased recently.  Her husband  was also concerned that she bruises easily. She is on Plavix. Review of this chart does not define exactly why she is on this  She does have Parkinson's and has a followup appointment with her Neurologist next week.  Husband feels she may have lost 14 pounds in the last 2 months.    Review of Systems  Denied were any change in heart rhythm or rate prior to the event. There was no associated chest pain or shortness of breath .  Also specifically denied prior to the episode were headache, limb weakness, tingling, or numbness. No seizure activity noted.      Objective:   Physical Exam   Pertinent positive findings include: She appears very frail and underweight. She exhibits  classic signs of Parkinson's with masklike facies; choppy gait; some cogwheeling of upper extremity; and some retropulsion with ambulation. There is some discomfort in the right medial thigh to palpation. Dementia is obvious in that she is unable to give me the correct date or the name of the President. She missed her anniversary date by one day.   General appearance : in no distress. Eyes: No conjunctival inflammation or scleral icterus is present. Heart:  Normal rate and regular rhythm. S1 and S2 normal without gallop, murmur, click, rub or other extra sounds   Lungs:Chest clear to auscultation; no wheezes, rhonchi,rales ,or rubs present.No increased work of breathing.  Abdomen: bowel sounds normal, soft and non-tender without masses, organomegaly or hernias noted.  No guarding or rebound.  Skin:Warm & dry.  Intact without suspicious lesions or rashes ; no jaundice or  tenting Lymphatic: No lymphadenopathy is noted about the head, neck, axilla           Assessment & Plan:  #1 mechanical fall in the context of a movement disorder, specifically Parkinson's. Parkinson's meds may need to be adjusted  #2 easy bruising in the context of Plavix therapy. Exact indication for this drug unclear.  #3 dementia  Plan: See orders and recommendations

## 2014-02-15 NOTE — Progress Notes (Signed)
Pre visit review using our clinic review tool, if applicable. No additional management support is needed unless otherwise documented below in the visit note. 

## 2014-02-17 ENCOUNTER — Ambulatory Visit: Payer: Self-pay | Admitting: Diagnostic Neuroimaging

## 2014-02-17 ENCOUNTER — Telehealth: Payer: Self-pay | Admitting: Hematology and Oncology

## 2014-02-17 NOTE — Telephone Encounter (Signed)
cld & spoke with pt & spose gave new appt time & date-pt understood

## 2014-02-22 ENCOUNTER — Encounter: Payer: Self-pay | Admitting: Diagnostic Neuroimaging

## 2014-02-22 ENCOUNTER — Other Ambulatory Visit: Payer: Self-pay | Admitting: Internal Medicine

## 2014-02-22 ENCOUNTER — Ambulatory Visit (INDEPENDENT_AMBULATORY_CARE_PROVIDER_SITE_OTHER): Payer: Medicare Other | Admitting: Diagnostic Neuroimaging

## 2014-02-22 VITALS — BP 109/57 | HR 68 | Temp 98.4°F | Ht 60.0 in | Wt 109.8 lb

## 2014-02-22 DIAGNOSIS — R238 Other skin changes: Secondary | ICD-10-CM | POA: Diagnosis not present

## 2014-02-22 DIAGNOSIS — R443 Hallucinations, unspecified: Secondary | ICD-10-CM | POA: Diagnosis not present

## 2014-02-22 DIAGNOSIS — R233 Spontaneous ecchymoses: Secondary | ICD-10-CM

## 2014-02-22 DIAGNOSIS — R269 Unspecified abnormalities of gait and mobility: Secondary | ICD-10-CM

## 2014-02-22 DIAGNOSIS — G20 Parkinson's disease: Secondary | ICD-10-CM

## 2014-02-22 MED ORDER — RASAGILINE MESYLATE 0.5 MG PO TABS: 0.5000 mg | ORAL_TABLET | Freq: Every day | ORAL | Status: DC

## 2014-02-22 NOTE — Progress Notes (Signed)
[facility]  PATIENT: [name] DOB: 10-22-1940  REFERRING CLINICIAN:  HISTORY FROM: patient and husband REASON FOR VISIT: follow up   HISTORICAL  CHIEF COMPLAINT:  Chief Complaint  Patient presents with  . Follow-up    falls    HISTORY OF PRESENT ILLNESS:   UPDATE 02/22/14: Since last visit, more falls. No warning. Has occurred with getting out of bed, bending over, making the bed. Husband hears a "thud" in the other room and finds her on the floor, no confusion or LOC. Hallucinations stable. No wearing off except in early AM before 1st dose stalevo.   UPDATE 12/11/13: Since last visit, was stable until 3 weeks ago. Now with more confusion, intermittent, sometimes not recognizing her family. Sometimes [place], even though she is standing in her home. No fevers, chills, cough, diarrhea, pain, SOB, CP, N, V. Non threatening visual hallucinations are stable. Memory worsening. Could not tolerate exelon patch (slightly paranoid about its effects).   UPDATE 10/30/12: Since last visit patient is stable. Reduced dosing of Stalevo seems to have improved her hallucinations, but her handwriting has worsened. She still sees children and people in her home, but according to the husband this is much less frequent. He has had to adjust some of the objects in his home which were bothering her. For example plants or water heaters high up in the room, or radiators on the floor, seem to bother her more. Once he removed these from her view she felt better. Tremor, gait and balance are stable. No falls. She does have more stooped posture than before but this seems to be stable since her back surgery. She is having some or drooling. She's having more runny nose. She keeps a napkin with her to wipe her nose. Patient's husband reports more short-term memory loss than before.  UPDATE 09/05/12: Since last visit, has reduced stalevo to QID. no change in  hallucinations or parkinson's symptoms. until 1 month ago, some progression of confusion, memory, hallucinations and paranoia. this fluctuates throughout the day.  UPDATE 07/07/12: Since last visit, tried seroquel x 2 weeks, but hallucinations worsened. Now off seroquel. Also had low back surgery (nov 2013), and went to rehab. Now back home. Stalevo dosing stable.  UPDATE 04/16/12: Never picked up seroquel after last visit. she didn't know that i rx'd this, even though we discussed it. will try rx again. hallucinations are stable. some more stiffness in hips lately. on 5 tabs stalevo per day.  UPDATE 08/14/11:  Continues to have hallucinations, mostly at night, denies being afraid but feels they are bothersome.  Has not thought about calling 911.  Realizes they are hallucinations.  Took Stalevo 4 times per day for one week without improvement of hallucinations, no change in parkinson's symptoms nowback up to 5 times a day.  She is interested in trying other medications for relief.  Also verbalizes concerns of choking episodes in her sleep.  Occurs approximately 3-4 times per month.  Denies choking episodes with eating or drinking.  PRIOR HPI: 73 year old female with history of hypothyroidism, hypertension, peripheral vascular disease presenting for evaluation and management of Parkinson's disease.  She is accompanied by her husband. In 2004 patient began to develop tremor in her bilateral upper extremities. She was diagnosed with Parkinson's disease. She was initially treated with Mirapex and Sinemet by Dr. Jacolyn Reedy ([place]).  She began to develop hallucinations and was ultimately changed to Stalevo by Dr. Marzetta Board ([facility]).  She has done fairly well on this medication. She takes Stalevo 37.5/150/200, 1 tab five times a day (7am, 11am, 3pm, 7pm, bedtime).  She notices wearing off around 4 hours after her dose. She has not had any falls. She denies hallucinations or constipation.  She would like to  re-establish care with a local neurologist.   REVIEW OF SYSTEMS: Full 14 system review of systems performed and notable only for memory loss hallucinations confusion back pain stiffness body stiffness runny nose gait diff.   ALLERGIES: No Known Allergies  HOME MEDICATIONS: Outpatient Prescriptions Prior to Visit  Medication Sig Dispense Refill  . aspirin 81 MG tablet Take 81 mg by mouth daily.        . Calcium Carbonate-Vitamin D (CALTRATE 600+D) 600-400 MG-UNIT per tablet Take 1 tablet by mouth 2 (two) times daily.        . carbidopa-levodopa-entacapone (STALEVO) 25-100-200 MG per tablet TAKE 1 TABLET BY MOUTH 4 (FOUR) TIMES DAILY.  120 tablet  12  . Cholecalciferol (VITAMIN D) 2000 UNITS CAPS Take 1 capsule (2,000 Units total) by mouth daily.  90 capsule  3  . clopidogrel (PLAVIX) 75 MG tablet TAKE 1 TABLET (75 MG TOTAL) BY MOUTH DAILY.  60 tablet  4  . cycloSPORINE (RESTASIS) 0.05 % ophthalmic emulsion Place 1 drop into both eyes 2 (two) times daily.        Marland Kitchen levothyroxine (SYNTHROID, LEVOTHROID) 75 MCG tablet TAKE 1 TABLET (75 MCG TOTAL) BY MOUTH DAILY.  60 tablet  5  . Multiple Vitamins-Minerals (WOMENS MULTIVITAMIN PLUS PO) Take 1 tablet by mouth daily.        . polyethylene glycol (MIRALAX / GLYCOLAX) packet Take 17 g by mouth daily as needed.  14 each  0  . rivastigmine (EXELON) 4.6 mg/24hr Place 1 patch (4.6 mg total) onto the skin daily.  30 patch  6  . traMADol (ULTRAM) 50 MG tablet Take 1 tablet by mouth as needed.       No facility-administered medications prior to visit.    PAST MEDICAL HISTORY: Past Medical History  Diagnosis Date  . Glaucoma   . Abnormal chest x-ray   . Hypertension   . Peripheral vascular disease   . Venous insufficiency   . Hypercholesteremia   . Hypothyroid   . Diverticulosis of colon   . DJD (degenerative joint disease)   . Hip pain   . Lumbar back pain   . Osteopenia   . Vitamin D deficiency   . Parkinson disease   . Anxiety   . Breast  cancer     stage 0 left    PAST SURGICAL HISTORY: Past Surgical History  Procedure Laterality Date  . Ovarian cyst removal    . Lapraoscopic cholecystectomy  09/2003    Dr. Dalbert Batman  . Lumbar laminectomy and foraminotomies for spinal stenosis  11/2005    Dr. Gladstone Lighter  . Cataract surgery and argon laser trabeculoplasty  04/2010    Dr. Herbert Deaner  . Colonoscopy    . Polypectomy    . Cholecystectomy    . Breast lumpectomy  8/11 by [facility]akefield  . Incise and drain abcess  10/04/11    sebaceous cyst on back  . Lumbar laminectomy/decompression microdiscectomy  05/20/2012    Procedure: LUMBAR LAMINECTOMY/DECOMPRESSION MICRODISCECTOMY 2 LEVELS;  Surgeon: Tobi Bastos, MD;  Location: [facility];  Service: Orthopedics;  Laterality: N/A;    FAMILY HISTORY: Family History  Problem Relation Age of Onset  . Colon cancer Father  SOCIAL HISTORY:  History   Social History  . Marital Status: Married    Spouse Name: [name]    Number of Children: 2  . Years of Education: Assoc   Occupational History  . retired   .     Social History Main Topics  . Smoking status: Never Smoker   . Smokeless tobacco: Never Used  . Alcohol Use: No  . Drug Use: No  . Sexual Activity: Not Currently   Other Topics Concern  . Not on file   Social History Narrative   Pt lives at home with spouse.   Caffeine Use: Very little; once a week     PHYSICAL EXAM  Filed Vitals:   02/22/14 0947  BP: 109/57  Pulse: 68  Temp: 98.4 F (36.9 C)  TempSrc: Oral  Height: 5' (1.524 m)  Weight: 109 lb 12.8 oz (49.805 kg)   Body mass index is 21.44 kg/(m^2).  EXAM: General: Patient is awake, alert and in no acute distress.  Well developed and groomed. Neck: Neck is supple. Cardiovascular: No carotid artery bruits.  Heart is regular rate and rhythm with no murmurs.  Neurologic Exam  Mental Status: Awake, alert.  Language has dec fluency. Comprehension intact. PLEASANT, CALM, COOPERATIVE. SOFT SPOKEN. MMSE  21/30. AFT 3. GDS 4. Cranial Nerves: Pupils are POST-SURGICAL.  Visual fields are full to confrontation.  Conjugate eye movements are full and symmetric.  Facial sensation and strength are symmetric.  Hearing is intact.  Palate elevated symmetrically and uvula is midline.  Shoulder shug is symmetric.  Tongue is midline.  MASKED FACIES.  POSITIVE MYERSON'S SIGN. Motor: Normal bulk and MOD COGWHEELING IN BUE. MILD REST TREMOR IN BUE. BRADYKINESIA IN BUE AND BLE (LEFT WORSE THAN RIGHT). Full strength in the upper and lower extremities.  No pronator drift. Sensory: Intact and symmetric to light touch. Coordination: No ataxia or dysmetria on finger-nose or rapid alternating movement testing.  SLOW RAM. Gait and Station: Narrow based gait; SLIGHT STOOPED POSTURE. SLOW SHORT STEPS. Reflexes: Deep tendon reflexes in the upper and lower extremity are TRACE and symmetric.   DIAGNOSTIC DATA (LABS, IMAGING, TESTING) - I reviewed patient records, labs, notes, testing and imaging myself where available.  Lab Results  Component Value Date   WBC 5.8 02/15/2014   HGB 11.8* 02/15/2014   HCT 36.0 02/15/2014   MCV 93.7 02/15/2014   PLT 228.0 02/15/2014      Component Value Date/Time   NA 145* 12/11/2013 0942   NA 142 09/01/2013 0956   NA 142 12/08/2012 0926   K 4.9 12/11/2013 0942   K 4.1 12/08/2012 0926   CL 104 12/11/2013 0942   CL 107 12/08/2012 0926   CO2 28 12/11/2013 0942   CO2 27 12/08/2012 0926   GLUCOSE 104* 12/11/2013 0942   GLUCOSE 112* 09/01/2013 0956   GLUCOSE 123* 12/08/2012 0926   BUN 14 12/11/2013 0942   BUN 15 09/01/2013 0956   BUN 15.9 12/08/2012 0926   CREATININE 0.91 12/11/2013 0942   CREATININE 0.8 12/08/2012 0926   CALCIUM 9.8 12/11/2013 0942   CALCIUM 9.1 12/08/2012 0926   PROT 6.7 12/11/2013 0942   PROT 6.6 09/01/2013 0956   PROT 6.1* 12/08/2012 0926   ALBUMIN 4.0 09/01/2013 0956   ALBUMIN 3.4* 12/08/2012 0926   AST 15 12/11/2013 0942   AST 12 12/08/2012 0926   ALT 7 12/11/2013 0942   ALT <6  Repeated and Verified 12/08/2012 0926   ALKPHOS 67 12/11/2013 0942  ALKPHOS 54 12/08/2012 0926   BILITOT 0.7 12/11/2013 0942   BILITOT 0.64 12/08/2012 0926   GFRNONAA 62 12/11/2013 0942   GFRAA 71 12/11/2013 0942   Lab Results  Component Value Date   CHOL 166 09/01/2013   HDL 64.50 09/01/2013   LDLCALC 96 09/01/2013   LDLDIRECT 142.2 10/28/2008   TRIG 26.0 09/01/2013   CHOLHDL 3 09/01/2013   Lab Results  Component Value Date   HGBA1C 6.0 09/01/2013   No results found for this basename: VITAMINB12   Lab Results  Component Value Date   TSH 1.15 09/01/2013    09/09/12 MRI brain - Mild perisylvian and moderate temporal atrophy. Moderate periventricular and subcortical chronic small vessel ischemic disease. No significant change from prior MRI on 05/08/11.   ASSESSMENT AND PLAN  73 y.o. female with history of hypothyroidism, hypertension, peripheral vascular disease presenting for evaluation and management of Parkinson's disease. She takes Stalevo 25/100/200, 1 tab 4 times a day. Non-threatening visual hallucinations are stable. More falls lately. Likely represent progression of her underlying neurodegenerative disorder.   PLAN: 1. Add azilect 0.5mg  daily; needs to stop tramadol however to avoid drug interaction 2. Continue stalevo 25/100/200 4 tabs per day  3. PT evaluation 4. Ok to stop plavix from neuro standpoint and continue on aspirin 81mg  daily; ask PCP to confirm  Orders Placed This Encounter  Procedures  . DME Other see comment    rollator walker; dx: parkinson's disease   Outpatient Encounter Prescriptions as of 02/22/2014  Medication Sig  . carbidopa-levodopa-entacapone (STALEVO) 25-100-200 MG per tablet TAKE 1 TABLET BY MOUTH 4 (FOUR) TIMES DAILY.  . rasagiline (AZILECT) 0.5 MG TABS tablet Take 1 tablet (0.5 mg total) by mouth daily.   Return in about 6 months (around 08/23/2014).     Penni Bombard, MD 1/97/5883, 25:49 AM Certified in Neurology, Neurophysiology and  Neuroimaging  [facility] [address] [place], [place] [address] [phone]

## 2014-02-22 NOTE — Patient Instructions (Addendum)
1. Start azilect 0.5mg  daily; do not take with tramadol 2. Continue stalevo 4 times per day 3. Try physical therapy 4. Use a rollator walker 5. Consider stopping plavix. Ask PCP if this is ok. Continue aspirin 81mg  daily.   Azilect/Rasagiline Oral Tablets What is this medicine? RASAGILINE (ra SA ji leen) is a monoamine oxidase inhibitor (MAOI). It is used to treat Parkinson's disease. This medicine may be used for other purposes; ask your health care provider or pharmacist if you have questions. COMMON BRAND NAME(S): Azilect What should I tell my health care provider before I take this medicine? They need to know if you have any of these conditions: -high or low blood pressure -history of stroke -liver disease -skin cancer -taken an MAOI like Carbex, Eldepryl, Marplan, Nardil, or Parnate in last 14 days -taking any of the following medicines: cyclobenzaprine, dextromethorphan, meperidine, methadone, propoxyphene, St. John's Wort, or tramadol -an unusual or allergic reaction to rasagiline, other medicines, foods, dyes, or preservatives -pregnant or trying to get pregnant -breast-feeding How should I use this medicine? Take this medicine by mouth with a glass of water. Follow the directions on the prescription label. Take your doses at regular intervals. Do not take your medicine more often than directed. Do not stop taking except on your doctor's advice. Talk to your pediatrician regarding the use of this medicine in children. Special care may be needed. Overdosage: If you think you have taken too much of this medicine contact a poison control center or emergency room at once. NOTE: This medicine is only for you. Do not share this medicine with others. What if I miss a dose? If you miss a dose, take it as soon as you can. If it is almost time for your next dose, take only that dose. Do not take double or extra doses. What may interact with this medicine? Do not take this medicine with  any of the following medications: -cyclobenzaprine -dextromethorphan -linezolid -MAOIs like Carbex, Eldepryl, Marplan, Nardil, and Parnate -medicines for depression -meperidine -methadone -propoxyphene -St. John's Wort -tramadol -tryptophan This medicine may also interact with the following medications: -amiodarone -anagrelide -antibiotics like ciprofloxacin, norfloxacin -buspirone -carbamazepine -cimetidine -diphenoxylate -female hormones, like estrogens -furazolidone -isoniazid -kava kava -levodopa -medicines for blood pressure -medicines for cold or congestion like pseudoephedrine -medicines for anxiety or psychotic disturbances -medicines for sleep during surgery -mexiletine -narcotic medicines for pain -procarbazine -SAM-e -stimulant medicines for attention, focus, or weight loss -tacrine -theophylline -tizanidine -valerian -yohimbine -zileuton This list may not describe all possible interactions. Give your health care provider a list of all the medicines, herbs, non-prescription drugs, or dietary supplements you use. Also tell them if you smoke, drink alcohol, or use illegal drugs. Some items may interact with your medicine. What should I watch for while using this medicine? Visit your doctor or health care professional for regular checks on your progress. Even after you stop taking this medicine the effects can last for two weeks or more. This medicine may increase your risk of getting skin cancer. Report any irregular moles, dark or white spots, or sores that do not heal. Have your skin checked for skin cancer regularly. This medicine can interact with certain foods that contain high amounts of tyramine. The combination may cause severe headaches, a rise in blood pressure, or irregular heart beat. Foods that contain significant amounts of tyramine include aged cheeses, meats and fish (especially aged, smoked, pickled, or processed such as bologna, pepperoni,  salami, summer sausage), beer and ale, alcohol-free  beer, wine (especially red), sherry, hard liquor, liqueurs, avocados, bananas, figs, raisins, soy sauce, miso soup, yeast/protein extracts, bean curd, fava or broad bean pods, or any over-ripe fruit. Ask your doctor or health care professional, pharmacist, or nutritionist for a complete listing of tyramine-containing foods. Talk to your doctor if you are taking, or are planning to take, any prescription or over-the-counter drugs, especially antidepressants and over-the-counter cold medications. Some medicines may interact with this medicine and could cause you adverse effects. If you are scheduled for any medical or dental procedure, tell your healthcare provider that you are taking this medicine. This medicine can interact with other medicines used during surgery. What side effects may I notice from receiving this medicine? Side effects that you should report to your doctor or health care professional as soon as possible: -allergic reactions like skin rash, itching or hives, swelling of the face, lips, or tongue -changes in vision -chest pain -confusion -hallucination, loss of contact with reality -high blood pressure -fast, irregular heartbeat -feeling faint or lightheaded, falls -fever -memory loss -muscle or neck stiffness or spasm -problems with balance, talking, or walking -seizures -sudden severe headache -suicidal thoughts or other mood changes -unexplained nausea or vomiting -unusual sweating Side effects that usually do not require medical attention (report to your doctor or health care professional if they continue or are bothersome): -diarrhea or constipation -dry mouth -loss of appetite -stomach pain This list may not describe all possible side effects. Call your doctor for medical advice about side effects. You may report side effects to FDA at 1-800-FDA-1088. Where should I keep my medicine? Keep out of the reach of  children. Store at room temperature between 15 and 30 degrees C (59 and 86 degrees F). Throw away any unused medicine after the expiration date. NOTE: This sheet is a summary. It may not cover all possible information. If you have questions about this medicine, talk to your doctor, pharmacist, or health care provider.  2015, Elsevier/Gold Standard. (2011-01-08 16:42:41)

## 2014-02-24 ENCOUNTER — Ambulatory Visit: Payer: Medicare Other | Admitting: Hematology and Oncology

## 2014-02-24 ENCOUNTER — Other Ambulatory Visit: Payer: Medicare Other

## 2014-02-24 ENCOUNTER — Other Ambulatory Visit: Payer: Self-pay | Admitting: *Deleted

## 2014-02-24 DIAGNOSIS — C50919 Malignant neoplasm of unspecified site of unspecified female breast: Secondary | ICD-10-CM

## 2014-02-25 ENCOUNTER — Ambulatory Visit (HOSPITAL_BASED_OUTPATIENT_CLINIC_OR_DEPARTMENT_OTHER): Payer: Medicare Other | Admitting: Hematology and Oncology

## 2014-02-25 ENCOUNTER — Encounter: Payer: Self-pay | Admitting: Hematology and Oncology

## 2014-02-25 ENCOUNTER — Other Ambulatory Visit (HOSPITAL_BASED_OUTPATIENT_CLINIC_OR_DEPARTMENT_OTHER): Payer: Medicare Other

## 2014-02-25 VITALS — BP 134/57 | HR 71 | Temp 98.0°F | Resp 18 | Ht 60.0 in | Wt 110.2 lb

## 2014-02-25 DIAGNOSIS — N6459 Other signs and symptoms in breast: Secondary | ICD-10-CM | POA: Diagnosis not present

## 2014-02-25 DIAGNOSIS — C50919 Malignant neoplasm of unspecified site of unspecified female breast: Secondary | ICD-10-CM

## 2014-02-25 DIAGNOSIS — Z853 Personal history of malignant neoplasm of breast: Secondary | ICD-10-CM

## 2014-02-25 DIAGNOSIS — N63 Unspecified lump in unspecified breast: Secondary | ICD-10-CM | POA: Diagnosis not present

## 2014-02-25 LAB — COMPREHENSIVE METABOLIC PANEL (CC13)
ALK PHOS: 64 U/L (ref 40–150)
ALT: 6 U/L (ref 0–55)
AST: 16 U/L (ref 5–34)
Albumin: 3.6 g/dL (ref 3.5–5.0)
Anion Gap: 8 mEq/L (ref 3–11)
BUN: 15.1 mg/dL (ref 7.0–26.0)
CO2: 27 mEq/L (ref 22–29)
CREATININE: 0.8 mg/dL (ref 0.6–1.1)
Calcium: 8.8 mg/dL (ref 8.4–10.4)
Chloride: 110 mEq/L — ABNORMAL HIGH (ref 98–109)
Glucose: 110 mg/dl (ref 70–140)
POTASSIUM: 4 meq/L (ref 3.5–5.1)
Sodium: 145 mEq/L (ref 136–145)
Total Bilirubin: 0.97 mg/dL (ref 0.20–1.20)
Total Protein: 6.1 g/dL — ABNORMAL LOW (ref 6.4–8.3)

## 2014-02-25 LAB — CBC WITH DIFFERENTIAL/PLATELET
BASO%: 1.4 % (ref 0.0–2.0)
Basophils Absolute: 0.1 10*3/uL (ref 0.0–0.1)
EOS%: 0.5 % (ref 0.0–7.0)
Eosinophils Absolute: 0 10*3/uL (ref 0.0–0.5)
HCT: 36.8 % (ref 34.8–46.6)
HEMOGLOBIN: 12 g/dL (ref 11.6–15.9)
LYMPH#: 0.8 10*3/uL — AB (ref 0.9–3.3)
LYMPH%: 16.7 % (ref 14.0–49.7)
MCH: 31.1 pg (ref 25.1–34.0)
MCHC: 32.5 g/dL (ref 31.5–36.0)
MCV: 95.9 fL (ref 79.5–101.0)
MONO#: 0.3 10*3/uL (ref 0.1–0.9)
MONO%: 5.3 % (ref 0.0–14.0)
NEUT#: 3.7 10*3/uL (ref 1.5–6.5)
NEUT%: 76.1 % (ref 38.4–76.8)
Platelets: 244 10*3/uL (ref 145–400)
RBC: 3.84 10*6/uL (ref 3.70–5.45)
RDW: 14.3 % (ref 11.2–14.5)
WBC: 4.9 10*3/uL (ref 3.9–10.3)

## 2014-02-25 NOTE — Assessment & Plan Note (Signed)
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 She was offered tamoxifen but she refused. On breast exam there was a lump that was palpable in the right breast very superficially. There were no palpable lymph nodes. Patient did not know that there was a lump in the breast. There was surrounding bruising from recent falls. It is unclear how long the lump has been there. Patient has Parkinson's disease and has underlying dementia issues as well. She requires assistance with going to all the appointments. We will request to see if her mammogram and biopsy can be arranged as soon as possible.  I would like her to come back after the mammogram and biopsy to discuss the results.

## 2014-02-25 NOTE — Progress Notes (Addendum)
Patient Care Team: Janith Lima, MD as PCP - General (Internal Medicine) Tobi Bastos, MD as Attending Physician (Orthopedic Surgery)  DIAGNOSIS: No matching staging information was found for the patient.  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 She has been on annual surveillance at her last mammogram was August 2014 was normal. She missed this years mammogram and is here for follow up visit. According to the notes she has had bad Parkinson's disease and had multiple falls. She requires assistance to going to these appointments and followups. She is relatively frail. She is accompanied by her husband.  REVIEW OF SYSTEMS:   Constitutional: Denies fevers, chills or abnormal weight loss Eyes: Denies blurriness of vision Ears, nose, mouth, throat, and face: Denies mucositis or sore throat Respiratory: Denies cough, dyspnea or wheezes Cardiovascular: Denies palpitation, chest discomfort or lower extremity swelling Gastrointestinal:  Denies nausea, heartburn or change in bowel habits Skin: Denies abnormal skin rashes Lymphatics: Denies new lymphadenopathy or easy bruising Neurological:Denies numbness, tingling or new weaknesses Behavioral/Psych: Mood is stable, no new changes  Breast:denies any pain in either breasts All other systems were reviewed with the patient and are negative.  I have reviewed the past medical history, past surgical history, social history and family history with the patient and they are unchanged from previous  note.  ALLERGIES:  has No Known Allergies.  MEDICATIONS:  Current Outpatient Prescriptions  Medication Sig Dispense Refill  . aspirin 81 MG tablet Take 81 mg by mouth daily.        . Calcium Carbonate-Vitamin D (CALTRATE 600+D) 600-400 MG-UNIT per tablet Take 1 tablet by mouth 2 (two) times daily.        . carbidopa-levodopa-entacapone (STALEVO) 25-100-200 MG per tablet TAKE 1 TABLET BY MOUTH 4 (FOUR) TIMES DAILY.  120 tablet  12  . Cholecalciferol (VITAMIN D) 2000 UNITS CAPS Take 1 capsule (2,000 Units total) by mouth daily.  90 capsule  3  . cycloSPORINE (RESTASIS) 0.05 % ophthalmic emulsion Place 1 drop into both eyes 2 (two) times daily.        Marland Kitchen levothyroxine (SYNTHROID, LEVOTHROID) 75 MCG tablet TAKE 1 TABLET (75 MCG TOTAL) BY MOUTH DAILY.  60 tablet  5  . Multiple Vitamins-Minerals (WOMENS MULTIVITAMIN PLUS PO) Take 1 tablet by mouth daily.        . polyethylene glycol (MIRALAX / GLYCOLAX) packet Take 17 g by mouth daily as needed.  14 each  0  . rasagiline (AZILECT) 0.5 MG TABS tablet Take 1 tablet (0.5 mg total) by mouth daily.  30 tablet  12  . rivastigmine (EXELON) 4.6 mg/24hr Place 1 patch (4.6 mg total) onto the skin daily.  30 patch  6  . traMADol (ULTRAM) 50 MG tablet Take 1 tablet by mouth as needed.       No current facility-administered medications for this visit.    PHYSICAL EXAMINATION: ECOG PERFORMANCE STATUS: 2 - Symptomatic, <50% confined to bed  Filed Vitals:   02/25/14 0833  BP: 134/57  Pulse: 71  Temp: 98 F (36.7 C)  Resp: 18  Filed Weights   02/25/14 0833  Weight: 110 lb 3.2 oz (49.986 kg)    GENERAL:alert, no distress and comfortable SKIN: skin color, texture, turgor are normal, no rashes or significant lesions EYES: normal, Conjunctiva are pink and non-injected, sclera clear OROPHARYNX:no exudate, no erythema and lips, buccal mucosa, and tongue normal  NECK: supple, thyroid normal size, non-tender, without nodularity LYMPH:  no palpable  lymphadenopathy in the cervical, axillary or inguinal LUNGS: clear to auscultation and percussion with normal breathing effort HEART: regular rate & rhythm and no murmurs and no lower extremity edema ABDOMEN:abdomen soft, non-tender and normal bowel sounds Musculoskeletal:no cyanosis of digits and no clubbing  NEURO: alert & oriented x 3 with fluent speech, no focal motor/sensory deficits BREAST: 3 cm palpable mass in the right breast that is subcutaneous. There is bruising around the breast from the recent fall.. No palpable axillary supraclavicular or infraclavicular adenopathy no breast tenderness or nipple discharge.   LABORATORY DATA:  I have reviewed the data as listed   Chemistry      Component Value Date/Time   NA 145 02/25/2014 0815   NA 145* 12/11/2013 0942   NA 142 09/01/2013 0956   K 4.0 02/25/2014 0815   K 4.9 12/11/2013 0942   CL 104 12/11/2013 0942   CL 107 12/08/2012 0926   CO2 27 02/25/2014 0815   CO2 28 12/11/2013 0942   BUN 15.1 02/25/2014 0815   BUN 14 12/11/2013 0942   BUN 15 09/01/2013 0956   CREATININE 0.8 02/25/2014 0815   CREATININE 0.91 12/11/2013 0942      Component Value Date/Time   CALCIUM 8.8 02/25/2014 0815   CALCIUM 9.8 12/11/2013 0942   ALKPHOS 64 02/25/2014 0815   ALKPHOS 67 12/11/2013 0942   AST 16 02/25/2014 0815   AST 15 12/11/2013 0942   ALT 6 02/25/2014 0815   ALT 7 12/11/2013 0942   BILITOT 0.97 02/25/2014 0815   BILITOT 0.7 12/11/2013 0942       Lab Results  Component Value Date   WBC 4.9 02/25/2014   HGB 12.0 02/25/2014   HCT 36.8 02/25/2014   MCV 95.9 02/25/2014   PLT 244 02/25/2014   NEUTROABS 3.7 02/25/2014     RADIOGRAPHIC STUDIES: I have personally reviewed the radiology reports and agreed with their findings. No results found.   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 She was offered tamoxifen but she refused. On breast exam there was a lump that was palpable in the  right breast very superficially. There were no palpable lymph nodes. Patient did not know that there was a lump in the breast. There was surrounding bruising from recent falls. It is unclear how long the lump has been there. Patient has Parkinson's disease and has underlying dementia issues as well. She requires assistance with going to all the appointments. We will request to see if her mammogram and biopsy can be arranged as soon as possible.  I would like her to come back after the mammogram and biopsy to discuss the results.   Orders Placed This Encounter  Procedures  . MM Digital Diagnostic Bilat    Order Specific Question:  Reason for Exam (SYMPTOM  OR DIAGNOSIS REQUIRED)    Answer:  Right Breast lump with H/O breast cancer     Comments:  Biopsy lump     Order Specific Question:  Preferred imaging location?    Answer:  External  Comments:  [facility]   The patient has a good understanding of the overall plan. she agrees with it. She will call with any problems that may develop before her next visit here.  I spent 30 minutes counseling the patient face to face. The total time spent in the appointment was 25 minutes and more than 50% was on counseling and review of test results    Rulon Eisenmenger, MD 02/25/2014 9:16 AM  Patient had an urgent mammogram done today for the palpable nodule and it came back as a new 3.5 cm area in the right breast 1:00 position that was felt to be a hematoma there was on the left breast 1.5 similar area at 10:00 position duct which is also resolving hematoma. This does fit the clinical description of the patient having recurrent falls and the clinical appearance of the breasts showing bruising around that area. A 4 month ultrasound was recommended by radiology.

## 2014-02-26 ENCOUNTER — Telehealth: Payer: Self-pay

## 2014-02-26 NOTE — Telephone Encounter (Signed)
Rcvd by fax results mammogram dtd 02/25/14.  Copy to [place].  Original to scan.

## 2014-03-02 ENCOUNTER — Telehealth: Payer: Self-pay | Admitting: Hematology and Oncology

## 2014-03-02 NOTE — Telephone Encounter (Signed)
s.w. pt and advised on SEpt appt....pt ok and aware...per [facility] pt only need a 29mth mammo no bx at this time.

## 2014-03-05 ENCOUNTER — Encounter (HOSPITAL_COMMUNITY): Payer: Self-pay | Admitting: Emergency Medicine

## 2014-03-05 ENCOUNTER — Emergency Department (HOSPITAL_COMMUNITY)
Admission: EM | Admit: 2014-03-05 | Discharge: 2014-03-05 | Disposition: A | Discharge status: Home / Self Care | Payer: Medicare Other | Attending: Emergency Medicine | Admitting: Emergency Medicine

## 2014-03-05 DIAGNOSIS — E039 Hypothyroidism, unspecified: Secondary | ICD-10-CM | POA: Insufficient documentation

## 2014-03-05 DIAGNOSIS — Z853 Personal history of malignant neoplasm of breast: Secondary | ICD-10-CM | POA: Diagnosis not present

## 2014-03-05 DIAGNOSIS — E559 Vitamin D deficiency, unspecified: Secondary | ICD-10-CM | POA: Insufficient documentation

## 2014-03-05 DIAGNOSIS — M7989 Other specified soft tissue disorders: Secondary | ICD-10-CM | POA: Insufficient documentation

## 2014-03-05 DIAGNOSIS — Z8739 Personal history of other diseases of the musculoskeletal system and connective tissue: Secondary | ICD-10-CM | POA: Diagnosis not present

## 2014-03-05 DIAGNOSIS — Z8719 Personal history of other diseases of the digestive system: Secondary | ICD-10-CM | POA: Diagnosis not present

## 2014-03-05 DIAGNOSIS — Z8669 Personal history of other diseases of the nervous system and sense organs: Secondary | ICD-10-CM | POA: Insufficient documentation

## 2014-03-05 DIAGNOSIS — I10 Essential (primary) hypertension: Secondary | ICD-10-CM | POA: Diagnosis not present

## 2014-03-05 DIAGNOSIS — Z7902 Long term (current) use of antithrombotics/antiplatelets: Secondary | ICD-10-CM | POA: Insufficient documentation

## 2014-03-05 DIAGNOSIS — I739 Peripheral vascular disease, unspecified: Secondary | ICD-10-CM | POA: Insufficient documentation

## 2014-03-05 DIAGNOSIS — G20A1 Parkinson's disease without dyskinesia, without mention of fluctuations: Secondary | ICD-10-CM | POA: Insufficient documentation

## 2014-03-05 DIAGNOSIS — Z79899 Other long term (current) drug therapy: Secondary | ICD-10-CM | POA: Diagnosis not present

## 2014-03-05 DIAGNOSIS — M79609 Pain in unspecified limb: Secondary | ICD-10-CM | POA: Diagnosis not present

## 2014-03-05 DIAGNOSIS — E7800 Pure hypercholesterolemia, unspecified: Secondary | ICD-10-CM | POA: Diagnosis not present

## 2014-03-05 DIAGNOSIS — G20 Parkinson's disease: Secondary | ICD-10-CM | POA: Insufficient documentation

## 2014-03-05 DIAGNOSIS — Z7982 Long term (current) use of aspirin: Secondary | ICD-10-CM | POA: Diagnosis not present

## 2014-03-05 DIAGNOSIS — F411 Generalized anxiety disorder: Secondary | ICD-10-CM | POA: Insufficient documentation

## 2014-03-05 LAB — URINE MICROSCOPIC-ADD ON

## 2014-03-05 LAB — CBC WITH DIFFERENTIAL/PLATELET
BASOS ABS: 0 10*3/uL (ref 0.0–0.1)
Basophils Relative: 1 % (ref 0–1)
EOS PCT: 1 % (ref 0–5)
Eosinophils Absolute: 0.1 10*3/uL (ref 0.0–0.7)
HCT: 36.7 % (ref 36.0–46.0)
Hemoglobin: 11.6 g/dL — ABNORMAL LOW (ref 12.0–15.0)
LYMPHS ABS: 1.3 10*3/uL (ref 0.7–4.0)
Lymphocytes Relative: 28 % (ref 12–46)
MCH: 30.9 pg (ref 26.0–34.0)
MCHC: 31.6 g/dL (ref 30.0–36.0)
MCV: 97.9 fL (ref 78.0–100.0)
Monocytes Absolute: 0.2 10*3/uL (ref 0.1–1.0)
Monocytes Relative: 5 % (ref 3–12)
Neutro Abs: 2.9 10*3/uL (ref 1.7–7.7)
Neutrophils Relative %: 65 % (ref 43–77)
PLATELETS: 199 10*3/uL (ref 150–400)
RBC: 3.75 MIL/uL — ABNORMAL LOW (ref 3.87–5.11)
RDW: 13.9 % (ref 11.5–15.5)
WBC: 4.4 10*3/uL (ref 4.0–10.5)

## 2014-03-05 LAB — URINALYSIS, ROUTINE W REFLEX MICROSCOPIC
Bilirubin Urine: NEGATIVE
Glucose, UA: NEGATIVE mg/dL
Hgb urine dipstick: NEGATIVE
Ketones, ur: NEGATIVE mg/dL
NITRITE: NEGATIVE
PH: 7 (ref 5.0–8.0)
Protein, ur: NEGATIVE mg/dL
Specific Gravity, Urine: 1.019 (ref 1.005–1.030)
UROBILINOGEN UA: 0.2 mg/dL (ref 0.0–1.0)

## 2014-03-05 LAB — COMPREHENSIVE METABOLIC PANEL
ALBUMIN: 3.5 g/dL (ref 3.5–5.2)
AST: 19 U/L (ref 0–37)
Alkaline Phosphatase: 62 U/L (ref 39–117)
Anion gap: 9 (ref 5–15)
BUN: 14 mg/dL (ref 6–23)
CALCIUM: 8.8 mg/dL (ref 8.4–10.5)
CO2: 28 mEq/L (ref 19–32)
Chloride: 108 mEq/L (ref 96–112)
Creatinine, Ser: 0.71 mg/dL (ref 0.50–1.10)
GFR calc non Af Amer: 82 mL/min — ABNORMAL LOW (ref >90)
GLUCOSE: 97 mg/dL (ref 70–99)
Potassium: 4.1 mEq/L (ref 3.7–5.3)
Sodium: 145 mEq/L (ref 137–147)
TOTAL PROTEIN: 5.9 g/dL — AB (ref 6.0–8.3)
Total Bilirubin: 0.7 mg/dL (ref 0.3–1.2)

## 2014-03-05 LAB — PRO B NATRIURETIC PEPTIDE: Pro B Natriuretic peptide (BNP): 132.5 pg/mL (ref 0–450)

## 2014-03-05 NOTE — Progress Notes (Signed)
*  Preliminary Results* Right lower extremity venous duplex completed. Right lower extremity is negative for deep vein thrombosis. There is no evidence of right Baker's cyst.  03/05/2014 7:06 PM  Maudry Mayhew, RVT, RDCS, RDMS

## 2014-03-05 NOTE — ED Notes (Signed)
Venous Duplex at bedside

## 2014-03-05 NOTE — ED Notes (Signed)
Pt ambulatory to restroom with husband, pt got lost trying to get back to room and was found at the [facility] elevators of main hospital. Pt walked backed to room, vitals stable. Pt in no apparent distress. Pt reminded to let RN or NT know when she needs to use the restroom and someone will stay with her.

## 2014-03-05 NOTE — ED Provider Notes (Signed)
CSN: [id number]     Arrival date & time 03/05/14  1443 History   First MD Initiated Contact with Patient 03/05/14 1544     Chief Complaint  Patient presents with  . Leg Swelling    4 day hx of swelling from knee to foot  . Leg Pain     (Consider location/radiation/quality/duration/timing/severity/associated sxs/prior Treatment) HPI Comments: Patient is a 73 year old female with history of glaucoma, hypertension, peripheral vascular disease,, hypercholesterolemia, hypothyroidism, diverticulosis, osteopenia, Parkinson's disease, breast cancer who presents to the emergency department today for evaluation of right leg swelling. She reports that this has been gradually worsening over the past 4 days. Pain is worse when she is ambulating. She currently denies pain while she is resting in the bed. No chest pain or shortness of breath.  The history is provided by the patient. No language interpreter was used.    Past Medical History  Diagnosis Date  . Glaucoma   . Abnormal chest x-ray   . Hypertension   . Peripheral vascular disease   . Venous insufficiency   . Hypercholesteremia   . Hypothyroid   . Diverticulosis of colon   . DJD (degenerative joint disease)   . Hip pain   . Lumbar back pain   . Osteopenia   . Vitamin D deficiency   . Parkinson disease   . Anxiety   . Breast cancer     stage 0 left   Past Surgical History  Procedure Laterality Date  . Ovarian cyst removal    . Lapraoscopic cholecystectomy  09/2003    Dr. Dalbert Batman  . Lumbar laminectomy and foraminotomies for spinal stenosis  11/2005    Dr. Gladstone Lighter  . Cataract surgery and argon laser trabeculoplasty  04/2010    Dr. Herbert Deaner  . Colonoscopy    . Polypectomy    . Cholecystectomy    . Breast lumpectomy  8/11 by [facility]  . Incise and drain abcess  10/04/11    sebaceous cyst on back  . Lumbar laminectomy/decompression microdiscectomy  05/20/2012    Procedure: LUMBAR LAMINECTOMY/DECOMPRESSION MICRODISCECTOMY 2  LEVELS;  Surgeon: Tobi Bastos, MD;  Location: [facility] ORS;  Service: Orthopedics;  Laterality: N/A;   Family History  Problem Relation Age of Onset  . Colon cancer Father    History  Substance Use Topics  . Smoking status: Never Smoker   . Smokeless tobacco: Never Used  . Alcohol Use: No   OB History   Grav Para Term Preterm Abortions TAB SAB Ect Mult Living                 Review of Systems  Constitutional: Negative for fever and chills.  Respiratory: Negative for shortness of breath.   Cardiovascular: Positive for leg swelling. Negative for chest pain and palpitations.  All other systems reviewed and are negative.     Allergies  Review of patient's allergies indicates no known allergies.  Home Medications   Prior to Admission medications   Medication Sig Start Date End Date Taking? Authorizing Provider  aspirin 81 MG tablet Take 81 mg by mouth daily.     Yes Historical Provider, MD  azelastine (OPTIVAR) 0.05 % ophthalmic solution 1 drop 2 (two) times daily.   Yes Historical Provider, MD  Calcium Carbonate-Vitamin D (CALTRATE 600+D) 600-400 MG-UNIT per tablet Take 1 tablet by mouth daily.    Yes Historical Provider, MD  carbidopa-levodopa-entacapone (STALEVO) 25-100-200 MG per tablet TAKE 1 TABLET BY MOUTH 4 (FOUR) TIMES DAILY. 01/18/14  Yes Vikram  R Penumalli, MD  clopidogrel (PLAVIX) 75 MG tablet Take 75 mg by mouth daily.   Yes Historical Provider, MD  levothyroxine (SYNTHROID, LEVOTHROID) 75 MCG tablet TAKE 1 TABLET (75 MCG TOTAL) BY MOUTH DAILY. 07/31/13  Yes Noralee Space, MD  lisinopril (PRINIVIL,ZESTRIL) 10 MG tablet Take 10 mg by mouth daily.   Yes Historical Provider, MD  rasagiline (AZILECT) 0.5 MG TABS tablet Take 1 tablet (0.5 mg total) by mouth daily. 02/22/14  Yes Vikram R Penumalli, MD   BP 122/53  Pulse 64  Temp(Src) 98.1 F (36.7 C) (Oral)  Wt 120 lb (54.432 kg)  SpO2 100% Physical Exam  Nursing note and vitals reviewed. Constitutional: She is oriented  to person, place, and time. She appears well-developed and well-nourished. No distress.  HENT:  Head: Normocephalic and atraumatic.  Right Ear: External ear normal.  Left Ear: External ear normal.  Nose: Nose normal.  Mouth/Throat: Oropharynx is clear and moist.  Eyes: Conjunctivae are normal.  Neck: Normal range of motion.  Cardiovascular: Normal rate, regular rhythm, normal heart sounds, intact distal pulses and normal pulses.   Pulses:      Radial pulses are 2+ on the right side, and 2+ on the left side.       Dorsalis pedis pulses are 2+ on the right side, and 2+ on the left side.       Posterior tibial pulses are 2+ on the right side, and 2+ on the left side.  Pulmonary/Chest: Effort normal and breath sounds normal. No stridor. No respiratory distress. She has no wheezes. She has no rales.  Swelling to right lower leg. Tenderness to palpation on calf  Abdominal: Soft. She exhibits no distension.  Musculoskeletal: Normal range of motion.  Neurological: She is alert and oriented to person, place, and time. She has normal strength.  Skin: Skin is warm and dry. She is not diaphoretic. No erythema.  No erythema, induration, or fluctuance to right leg.   Psychiatric: She has a normal mood and affect. Her behavior is normal.    ED Course  Procedures (including critical care time) Labs Review Labs Reviewed  CBC WITH DIFFERENTIAL - Abnormal; Notable for the following:    RBC 3.75 (*)    Hemoglobin 11.6 (*)    All other components within normal limits  COMPREHENSIVE METABOLIC PANEL - Abnormal; Notable for the following:    Total Protein 5.9 (*)    GFR calc non Af Amer 82 (*)    All other components within normal limits  URINALYSIS, ROUTINE W REFLEX MICROSCOPIC - Abnormal; Notable for the following:    Color, Urine ORANGE (*)    Leukocytes, UA TRACE (*)    All other components within normal limits  URINE MICROSCOPIC-ADD ON - Abnormal; Notable for the following:    Squamous  Epithelial / LPF FEW (*)    All other components within normal limits  PRO B NATRIURETIC PEPTIDE    Imaging Review No results found.   EKG Interpretation None      MDM   Final diagnoses:  Right leg swelling    Patient presents emergency department for right leg swelling. Vascular ultrasound of right leg is negative for DVT. BNP is not elevated. Labs are unremarkable. Patient encouraged to elevate her leg and followup with PCP. Discussed reasons to return to ED immediately. Vital signs stable for discharge. Dr. Mingo Amber evaluated patient and agrees with plan. Patient / Family / Caregiver informed of clinical course, understand medical decision-making process, and agree  with plan.     Elwyn Lade, PA-C 03/06/14 [phone]

## 2014-03-05 NOTE — Discharge Instructions (Signed)
Peripheral Edema °You have swelling in your legs (peripheral edema). This swelling is due to excess accumulation of salt and water in your body. Edema may be a sign of heart, kidney or liver disease, or a side effect of a medication. It may also be due to problems in the leg veins. Elevating your legs and using special support stockings may be very helpful, if the cause of the swelling is due to poor venous circulation. Avoid long periods of standing, whatever the cause. °Treatment of edema depends on identifying the cause. Chips, pretzels, pickles and other salty foods should be avoided. Restricting salt in your diet is almost always needed. Water pills (diuretics) are often used to remove the excess salt and water from your body via urine. These medicines prevent the kidney from reabsorbing sodium. This increases urine flow. °Diuretic treatment may also result in lowering of potassium levels in your body. Potassium supplements may be needed if you have to use diuretics daily. Daily weights can help you keep track of your progress in clearing your edema. You should call your caregiver for follow up care as recommended. °SEEK IMMEDIATE MEDICAL CARE IF:  °· You have increased swelling, pain, redness, or heat in your legs. °· You develop shortness of breath, especially when lying down. °· You develop chest or abdominal pain, weakness, or fainting. °· You have a fever. °Document Released: 07/19/2004 Document Revised: 09/03/2011 Document Reviewed: 06/29/2009 °ExitCare® Patient Information ©2015 [employer]. This information is not intended to replace advice given to you by your health care provider. Make sure you discuss any questions you have with your health care provider. ° °

## 2014-03-05 NOTE — ED Notes (Signed)
Pt and husband report a 4 day hx of progressive swelling of r/foot,up to knee. Foot warm. Pt reports sharp pain in anterior lower leg. R/lower leg markedly swollen . Pt able to walk with cane

## 2014-03-05 NOTE — ED Notes (Signed)
Pt and family member very much ready to go

## 2014-03-06 NOTE — ED Provider Notes (Signed)
Medical screening examination/treatment/procedure(s) were conducted as a shared visit with non-physician practitioner(s) and myself.  I personally evaluated the patient during the encounter.   EKG Interpretation None      Patient here with RLE swelling - negative for DVT. Labs ok. Patient stable for discharge.  Evelina Bucy, MD 03/06/14 1524

## 2014-03-10 ENCOUNTER — Encounter: Payer: Self-pay | Admitting: Pulmonary Disease

## 2014-03-18 ENCOUNTER — Ambulatory Visit (INDEPENDENT_AMBULATORY_CARE_PROVIDER_SITE_OTHER): Payer: Medicare Other | Admitting: Internal Medicine

## 2014-03-18 ENCOUNTER — Encounter: Payer: Self-pay | Admitting: Internal Medicine

## 2014-03-18 ENCOUNTER — Other Ambulatory Visit (INDEPENDENT_AMBULATORY_CARE_PROVIDER_SITE_OTHER): Payer: Medicare Other

## 2014-03-18 VITALS — BP 132/68 | HR 60 | Temp 98.1°F | Resp 16 | Wt 109.0 lb

## 2014-03-18 DIAGNOSIS — C50919 Malignant neoplasm of unspecified site of unspecified female breast: Secondary | ICD-10-CM

## 2014-03-18 DIAGNOSIS — I10 Essential (primary) hypertension: Secondary | ICD-10-CM

## 2014-03-18 DIAGNOSIS — M1990 Unspecified osteoarthritis, unspecified site: Secondary | ICD-10-CM

## 2014-03-18 DIAGNOSIS — R7309 Other abnormal glucose: Secondary | ICD-10-CM

## 2014-03-18 DIAGNOSIS — E039 Hypothyroidism, unspecified: Secondary | ICD-10-CM

## 2014-03-18 DIAGNOSIS — R609 Edema, unspecified: Secondary | ICD-10-CM | POA: Diagnosis not present

## 2014-03-18 DIAGNOSIS — R296 Repeated falls: Secondary | ICD-10-CM

## 2014-03-18 DIAGNOSIS — E7800 Pure hypercholesterolemia, unspecified: Secondary | ICD-10-CM

## 2014-03-18 DIAGNOSIS — G20 Parkinson's disease: Secondary | ICD-10-CM

## 2014-03-18 DIAGNOSIS — F411 Generalized anxiety disorder: Secondary | ICD-10-CM

## 2014-03-18 DIAGNOSIS — H409 Unspecified glaucoma: Secondary | ICD-10-CM

## 2014-03-18 LAB — URINALYSIS, ROUTINE W REFLEX MICROSCOPIC
Bilirubin Urine: NEGATIVE
HGB URINE DIPSTICK: NEGATIVE
Leukocytes, UA: NEGATIVE
Nitrite: NEGATIVE
RBC / HPF: NONE SEEN (ref 0–?)
Specific Gravity, Urine: 1.02 (ref 1.000–1.030)
Total Protein, Urine: NEGATIVE
URINE GLUCOSE: NEGATIVE
UROBILINOGEN UA: 0.2 (ref 0.0–1.0)
pH: 6 (ref 5.0–8.0)

## 2014-03-18 LAB — BASIC METABOLIC PANEL
BUN: 14 mg/dL (ref 6–23)
CO2: 31 mEq/L (ref 19–32)
Calcium: 9.3 mg/dL (ref 8.4–10.5)
Chloride: 105 mEq/L (ref 96–112)
Creatinine, Ser: 0.8 mg/dL (ref 0.4–1.2)
GFR: 93.88 mL/min (ref >60.00)
GLUCOSE: 99 mg/dL (ref 70–99)
Potassium: 4.3 mEq/L (ref 3.5–5.1)
SODIUM: 142 meq/L (ref 135–145)

## 2014-03-18 LAB — TSH: TSH: 8.16 u[IU]/mL — ABNORMAL HIGH (ref 0.35–4.50)

## 2014-03-18 MED ORDER — LEVOTHYROXINE SODIUM 88 MCG PO TABS: 88.0000 ug | ORAL_TABLET | Freq: Every day | ORAL | Status: DC

## 2014-03-18 MED ORDER — TORSEMIDE 10 MG PO TABS: 10.0000 mg | ORAL_TABLET | Freq: Every day | ORAL | Status: DC

## 2014-03-18 NOTE — Assessment & Plan Note (Signed)
I will recheck her TSH and will adjust her dose if needed 

## 2014-03-18 NOTE — Progress Notes (Signed)
Subjective:    Patient ID: [name], female    DOB: Apr 09, 1941, 73 y.o.   MRN: [id number]  Hypertension This is a chronic problem. The current episode started more than 1 year ago. The problem is unchanged. The problem is controlled. Associated symptoms include peripheral edema. Pertinent negatives include no anxiety, blurred vision, chest pain, headaches, malaise/fatigue, neck pain, orthopnea, palpitations, PND, shortness of breath or sweats. Past treatments include ACE inhibitors. The current treatment provides moderate improvement. Compliance problems include diet and exercise.  Hypertensive end-organ damage includes a thyroid problem.      Review of Systems  Constitutional: Negative for fever, chills, malaise/fatigue, diaphoresis, activity change, appetite change, fatigue and unexpected weight change.  HENT: Negative.   Eyes: Negative.  Negative for blurred vision.  Respiratory: Negative.  Negative for cough, choking, chest tightness, shortness of breath, wheezing and stridor.   Cardiovascular: Positive for leg swelling. Negative for chest pain, palpitations, orthopnea and PND.  Gastrointestinal: Negative.  Negative for nausea, vomiting, abdominal pain, diarrhea, constipation and blood in stool.  Endocrine: Negative.   Genitourinary: Negative.  Negative for dysuria, urgency, frequency, hematuria, decreased urine volume and difficulty urinating.  Musculoskeletal: Positive for back pain. Negative for arthralgias, joint swelling, myalgias, neck pain and neck stiffness.  Skin: Negative.  Negative for rash.  Allergic/Immunologic: Negative.   Neurological: Negative.  Negative for dizziness, tremors, seizures, syncope, facial asymmetry, speech difficulty, weakness, light-headedness, numbness and headaches.  Hematological: Negative.  Negative for adenopathy. Does not bruise/bleed easily.  Psychiatric/Behavioral: Positive for confusion and decreased concentration. Negative for suicidal  ideas, hallucinations, behavioral problems, sleep disturbance, self-injury, dysphoric mood and agitation. The patient is not nervous/anxious and is not hyperactive.        Objective:   Physical Exam  Vitals reviewed. Constitutional: She appears well-developed and well-nourished. No distress.  HENT:  Head: Normocephalic and atraumatic.  Mouth/Throat: Oropharynx is clear and moist. No oropharyngeal exudate.  Eyes: Conjunctivae are normal. Right eye exhibits no discharge. Left eye exhibits no discharge. No scleral icterus.  Neck: Normal range of motion. Neck supple. No JVD present. No tracheal deviation present. No thyromegaly present.  Cardiovascular: Normal rate, regular rhythm, normal heart sounds and intact distal pulses.  Exam reveals no gallop and no friction rub.   No murmur heard. Pulmonary/Chest: Effort normal and breath sounds normal. No stridor. No respiratory distress. She has no wheezes. She has no rales. She exhibits no tenderness.  Abdominal: Soft. Bowel sounds are normal. She exhibits no distension and no mass. There is no tenderness. There is no rebound and no guarding.  Musculoskeletal: Normal range of motion. She exhibits edema (2+ pitting edema in BLE). She exhibits no tenderness.  Lymphadenopathy:    She has no cervical adenopathy.  Neurological: She is alert. She displays atrophy and tremor. She displays normal reflexes. No cranial nerve deficit or sensory deficit. She exhibits abnormal muscle tone. She displays no seizure activity. Coordination and gait abnormal.  Reflex Scores:      Tricep reflexes are 1+ on the right side and 1+ on the left side.      Bicep reflexes are 1+ on the right side and 1+ on the left side.      Brachioradialis reflexes are 1+ on the right side and 1+ on the left side.      Patellar reflexes are 0 on the right side and 0 on the left side.      Achilles reflexes are 0 on the right side and 0 on  the left side. Skin: Skin is warm and dry. No rash  noted. She is not diaphoretic. No erythema. No pallor.  Psychiatric: Judgment normal. Her mood appears not anxious. Her speech is delayed and tangential. She is slowed and withdrawn. She is not agitated, not aggressive and not actively hallucinating. Cognition and memory are impaired. She does not exhibit a depressed mood. She expresses no homicidal and no suicidal ideation. She expresses no suicidal plans and no homicidal plans. She exhibits abnormal recent memory and abnormal remote memory. She is inattentive.     Lab Results  Component Value Date   WBC 4.4 03/05/2014   HGB 11.6* 03/05/2014   HCT 36.7 03/05/2014   PLT 199 03/05/2014   GLUCOSE 97 03/05/2014   CHOL 166 09/01/2013   TRIG 26.0 09/01/2013   HDL 64.50 09/01/2013   LDLDIRECT 142.2 10/28/2008   LDLCALC 96 09/01/2013   ALT <5 03/05/2014   AST 19 03/05/2014   NA 145 03/05/2014   K 4.1 03/05/2014   CL 108 03/05/2014   CREATININE 0.71 03/05/2014   BUN 14 03/05/2014   CO2 28 03/05/2014   TSH 1.15 09/01/2013   INR 1.10 05/19/2012   HGBA1C 6.0 09/01/2013       Assessment & Plan:

## 2014-03-18 NOTE — Assessment & Plan Note (Signed)
Her BP is well controlled She complains of edema, will start demadex

## 2014-03-18 NOTE — Assessment & Plan Note (Signed)
Will check her labs today to look for other causes Start demadex

## 2014-03-18 NOTE — Addendum Note (Signed)
Addended by: Janith Lima on: 03/18/2014 11:58 AM   Modules accepted: Orders, Medications

## 2014-03-18 NOTE — Patient Instructions (Signed)

## 2014-03-23 ENCOUNTER — Telehealth: Payer: Self-pay | Admitting: Hematology and Oncology

## 2014-03-23 ENCOUNTER — Ambulatory Visit (HOSPITAL_BASED_OUTPATIENT_CLINIC_OR_DEPARTMENT_OTHER): Payer: Medicare Other | Admitting: Hematology and Oncology

## 2014-03-23 DIAGNOSIS — D0590 Unspecified type of carcinoma in situ of unspecified breast: Secondary | ICD-10-CM

## 2014-03-23 NOTE — Telephone Encounter (Signed)
, °

## 2014-03-23 NOTE — Assessment & Plan Note (Signed)
1. DCIS left breast status post lumpectomy and radiation refused tamoxifen therapy: I reviewed the mammogram and ultrasound report from recent studies which showed that there were no mammographic abnormalities in the ultrasound showed what appears to be hematomas which is consistent with her history of frequent falls. I discussed with her that we will do once a year mammograms for surveillance and we will do a physical exam and breast exams every 6 months. I will see her back in 6 months for followup  2. Gait issues and frequent falls: Patient's family is very much aware of this and they're trying their best to prevent falls and working with her primary care physician.

## 2014-03-23 NOTE — Progress Notes (Signed)
Patient Care Team: Janith Lima, MD as PCP - General (Internal Medicine) Tobi Bastos, MD as Attending Physician (Orthopedic Surgery)  DIAGNOSIS: No matching staging information was found for the patient.  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 She had been having frequent falls and had a palpable abnormality in the breast which we requested an urgent mammogram and ultrasound evaluation. It was felt that the mammogram and ultrasound were consistent with a hematoma. She is here accompanied by her family to discuss those results. She denies any new complaints or concerns. She is still very frail and unsteady in her gait.  REVIEW OF SYSTEMS:   Constitutional: Frail lady with unsteadiness of gait Eyes: Denies blurriness of vision Ears, nose, mouth, throat, and face: Denies mucositis or sore throat Respiratory: Denies cough, dyspnea or wheezes Cardiovascular: Denies palpitation, chest discomfort or lower extremity swelling Gastrointestinal:  Denies nausea, heartburn or change in bowel habits Skin: Denies abnormal skin rashes Lymphatics: Denies new lymphadenopathy or easy bruising Neurological: Gait issues and frequent falls Behavioral/Psych: Mood is stable, no new changes  Breast: Nodularity in the breast related to hematoma All other systems were reviewed with the patient and are negative.  I have reviewed the past medical history, past surgical  history, social history and family history with the patient and they are unchanged from previous note.  ALLERGIES:  has No Known Allergies.  MEDICATIONS:  Current Outpatient Prescriptions  Medication Sig Dispense Refill  . aspirin 81 MG tablet Take 81 mg by mouth daily.        Marland Kitchen azelastine (OPTIVAR) 0.05 % ophthalmic solution 1 drop 2 (two) times daily.      . Calcium Carbonate-Vitamin D (CALTRATE 600+D) 600-400 MG-UNIT per tablet Take 1 tablet by mouth daily.       . carbidopa-levodopa-entacapone (STALEVO) 25-100-200 MG per tablet TAKE 1 TABLET BY MOUTH 4 (FOUR) TIMES DAILY.  120 tablet  12  . levothyroxine (SYNTHROID, LEVOTHROID) 88 MCG tablet Take 1 tablet (88 mcg total) by mouth daily.  90 tablet  1  . lisinopril (PRINIVIL,ZESTRIL) 10 MG tablet Take 10 mg by mouth daily.      . rasagiline (AZILECT) 0.5 MG TABS tablet Take 1 tablet (0.5 mg total) by mouth daily.  30 tablet  12  . torsemide (DEMADEX) 10 MG tablet Take 1 tablet (10 mg total) by mouth daily.  90 tablet  1   No current facility-administered medications for this visit.    PHYSICAL EXAMINATION: ECOG PERFORMANCE STATUS: 3 - Symptomatic, >50% confined to bed  There were no vitals filed for this visit. There were no vitals filed for this visit.  GENERAL:alert, no distress and comfortable SKIN: skin color, texture, turgor are normal, no rashes or significant lesions EYES: normal, Conjunctiva are pink and non-injected, sclera clear OROPHARYNX:no exudate, no erythema and lips, buccal mucosa, and tongue normal  NECK: supple, thyroid normal size, non-tender, without nodularity LYMPH:  no palpable lymphadenopathy in the cervical, axillary or inguinal LUNGS: clear to auscultation and  percussion with normal breathing effort HEART: Ejection systolic murmur in the mitral area ABDOMEN:abdomen soft, non-tender and normal bowel sounds Musculoskeletal:no cyanosis of digits and no clubbing  NEURO: Frail frequent falls  LABORATORY  DATA:  I have reviewed the data as listed   Chemistry      Component Value Date/Time   NA 142 03/18/2014 0946   NA 145 02/25/2014 0815   NA 145* 12/11/2013 0942   K 4.3 03/18/2014 0946   K 4.0 02/25/2014 0815   CL 105 03/18/2014 0946   CL 107 12/08/2012 0926   CO2 31 03/18/2014 0946   CO2 27 02/25/2014 0815   BUN 14 03/18/2014 0946   BUN 15.1 02/25/2014 0815   BUN 14 12/11/2013 0942   CREATININE 0.8 03/18/2014 0946   CREATININE 0.8 02/25/2014 0815      Component Value Date/Time   CALCIUM 9.3 03/18/2014 0946   CALCIUM 8.8 02/25/2014 0815   ALKPHOS 62 03/05/2014 1938   ALKPHOS 64 02/25/2014 0815   AST 19 03/05/2014 1938   AST 16 02/25/2014 0815   ALT <5 03/05/2014 1938   ALT 6 02/25/2014 0815   BILITOT 0.7 03/05/2014 1938   BILITOT 0.97 02/25/2014 0815       Lab Results  Component Value Date   WBC 4.4 03/05/2014   HGB 11.6* 03/05/2014   HCT 36.7 03/05/2014   MCV 97.9 03/05/2014   PLT 199 03/05/2014   NEUTROABS 2.9 03/05/2014     RADIOGRAPHIC STUDIES: I have personally reviewed the radiology reports and agreed with their findings. Ultrasound of the breast revealed 3.5 cm area in the right breast 1:00 benign, 1.5 cm left breast at 10:00 benign  ASSESSMENT & PLAN:  BREAST CANCER 1. DCIS left breast status post lumpectomy and radiation refused tamoxifen therapy: I reviewed the mammogram and ultrasound report from recent studies which showed that there were no mammographic abnormalities in the ultrasound showed what appears to be hematomas which is consistent with her history of frequent falls. I discussed with her that we will do once a year mammograms for surveillance and we will do a physical exam and breast exams every 6 months. I will see her back in 6 months for followup  2. Gait issues and frequent falls: Patient's family is very much aware of this and they're trying their best to prevent falls and working with her primary care physician.    No orders of the defined types were placed in this  encounter.   The patient has a good understanding of the overall plan. she agrees with it. She will call with any problems that may develop before her next visit here.  I spent 15 minutes counseling the patient face to face. The total time spent in the appointment was 20 minutes and more than 50% was on counseling and review of test results    Rulon Eisenmenger, MD 03/23/2014 9:19 AM

## 2014-03-23 NOTE — Addendum Note (Signed)
Addended by: Prentiss Bells on: 03/23/2014 09:25 AM   Modules accepted: Orders

## 2014-03-25 ENCOUNTER — Encounter: Payer: Self-pay | Admitting: Gastroenterology

## 2014-04-02 ENCOUNTER — Ambulatory Visit: Payer: Medicare Other | Attending: Internal Medicine | Admitting: Physical Therapy

## 2014-04-02 DIAGNOSIS — G20 Parkinson's disease: Secondary | ICD-10-CM | POA: Diagnosis not present

## 2014-04-02 DIAGNOSIS — Z9181 History of falling: Secondary | ICD-10-CM | POA: Insufficient documentation

## 2014-04-02 DIAGNOSIS — R269 Unspecified abnormalities of gait and mobility: Secondary | ICD-10-CM | POA: Insufficient documentation

## 2014-04-09 ENCOUNTER — Ambulatory Visit: Payer: Medicare Other

## 2014-04-09 ENCOUNTER — Telehealth: Payer: Self-pay | Admitting: Hematology and Oncology

## 2014-04-09 DIAGNOSIS — R269 Unspecified abnormalities of gait and mobility: Secondary | ICD-10-CM | POA: Diagnosis not present

## 2014-04-09 DIAGNOSIS — Z9181 History of falling: Secondary | ICD-10-CM | POA: Diagnosis not present

## 2014-04-09 DIAGNOSIS — G20 Parkinson's disease: Secondary | ICD-10-CM | POA: Diagnosis not present

## 2014-04-09 NOTE — Telephone Encounter (Signed)
S/w pt advised appt chg from 3/29 to 4/19.

## 2014-04-12 ENCOUNTER — Ambulatory Visit: Payer: Medicare Other

## 2014-04-12 DIAGNOSIS — R269 Unspecified abnormalities of gait and mobility: Secondary | ICD-10-CM | POA: Diagnosis not present

## 2014-04-12 DIAGNOSIS — G20 Parkinson's disease: Secondary | ICD-10-CM | POA: Diagnosis not present

## 2014-04-12 DIAGNOSIS — Z9181 History of falling: Secondary | ICD-10-CM | POA: Diagnosis not present

## 2014-04-15 ENCOUNTER — Ambulatory Visit: Payer: Medicare Other

## 2014-04-19 ENCOUNTER — Ambulatory Visit: Payer: Medicare Other

## 2014-04-19 DIAGNOSIS — R269 Unspecified abnormalities of gait and mobility: Secondary | ICD-10-CM | POA: Diagnosis not present

## 2014-04-19 DIAGNOSIS — G20 Parkinson's disease: Secondary | ICD-10-CM | POA: Diagnosis not present

## 2014-04-19 DIAGNOSIS — Z9181 History of falling: Secondary | ICD-10-CM | POA: Diagnosis not present

## 2014-04-22 ENCOUNTER — Ambulatory Visit: Payer: Medicare Other | Admitting: Physical Therapy

## 2014-04-22 DIAGNOSIS — G20 Parkinson's disease: Secondary | ICD-10-CM | POA: Diagnosis not present

## 2014-04-22 DIAGNOSIS — R269 Unspecified abnormalities of gait and mobility: Secondary | ICD-10-CM | POA: Diagnosis not present

## 2014-04-22 DIAGNOSIS — Z9181 History of falling: Secondary | ICD-10-CM | POA: Diagnosis not present

## 2014-04-28 ENCOUNTER — Encounter: Payer: Self-pay | Admitting: Physical Therapy

## 2014-04-28 ENCOUNTER — Ambulatory Visit: Payer: Medicare Other | Attending: Internal Medicine | Admitting: Physical Therapy

## 2014-04-28 DIAGNOSIS — G20 Parkinson's disease: Secondary | ICD-10-CM | POA: Diagnosis not present

## 2014-04-28 DIAGNOSIS — R269 Unspecified abnormalities of gait and mobility: Secondary | ICD-10-CM | POA: Diagnosis not present

## 2014-04-28 DIAGNOSIS — Z9181 History of falling: Secondary | ICD-10-CM | POA: Diagnosis not present

## 2014-04-28 DIAGNOSIS — R258 Other abnormal involuntary movements: Secondary | ICD-10-CM | POA: Insufficient documentation

## 2014-04-28 DIAGNOSIS — R2689 Other abnormalities of gait and mobility: Secondary | ICD-10-CM | POA: Insufficient documentation

## 2014-04-28 NOTE — Patient Instructions (Signed)
Making turns in tight spaces using RW:  -Stop walking and stop moving feet and stop moving the walker  -Turn the walker in the direction you are moving  -Then, step your feet towards the walker  -Repeat the turning of the walker and stepping towards the walker until you have turned fully where you need to be.

## 2014-04-28 NOTE — Therapy (Signed)
Physical Therapy Treatment  Patient Details  Name: [name] MRN: [id number] Date of Birth: Nov 16, 1940  Encounter Date: 04/28/2014      PT End of Session - 04/28/14 0854    Visit Number 6   Number of Visits 17   Date for PT Re-Evaluation 06/01/14   PT Start Time 0805   PT Stop Time 1308   PT Time Calculation (min) 42 min      Past Medical History  Diagnosis Date  . Glaucoma   . Abnormal chest x-ray   . Hypertension   . Peripheral vascular disease   . Venous insufficiency   . Hypercholesteremia   . Hypothyroid   . Diverticulosis of colon   . DJD (degenerative joint disease)   . Hip pain   . Lumbar back pain   . Osteopenia   . Vitamin D deficiency   . Parkinson disease   . Anxiety   . Breast cancer     stage 0 left    Past Surgical History  Procedure Laterality Date  . Ovarian cyst removal    . Lapraoscopic cholecystectomy  09/2003    Dr. Dalbert [name]  . Lumbar laminectomy and foraminotomies for spinal stenosis  11/2005    Dr. Gladstone [name]  . Cataract surgery and argon laser trabeculoplasty  04/2010    Dr. Herbert [name]  . Colonoscopy    . Polypectomy    . Cholecystectomy    . Breast lumpectomy  8/11 by [name]  . Incise and drain abcess  10/04/11    sebaceous cyst on back  . Lumbar laminectomy/decompression microdiscectomy  05/20/2012    Procedure: LUMBAR LAMINECTOMY/DECOMPRESSION MICRODISCECTOMY 2 LEVELS;  Surgeon: Kristin Bastos, MD;  Location: [facility];  Service: Orthopedics;  Laterality: N/A;    There were no vitals taken for this visit.  Visit Diagnosis:  Abnormality of gait          Adult PT Treatment/Exercise - 04/28/14 0700    Transfers   Transfers Sit to Stand;Stand to Sit   Sit to Stand 5: Supervision;From elevated surface;From chair/3-in-1;With upper extremity assist  2 sets of 10 reps; also from 16 inch chair   Sit to Stand Details (indicate cue type and reason) cues for forward lean and upright standing   Stand to Sit 5: Supervision;With upper  extremity assist;To elevated surface;To chair/3-in-1   Ambulation/Gait   Ambulation/Gait Yes  Cues for turning using RW (turn RW and step to RW)   Ambulation/Gait Assistance 4: Min guard   Ambulation Distance (Feet) --  variable distances with obstacle/furniture negotiation   Assistive device Rolling walker   Gait Pattern Decreased step length - right;Decreased step length - left;Narrow base of support  cues for posture and turns with obstacles   Standardized Balance Assessment   Standardized Balance Assessment Timed Up and Go Test   Timed Up and Go Test   TUG Normal TUG   Normal TUG (seconds) 35.63  1st trial with RW; 2nd trial:  28.64 sec with RW          Education - 04/28/14 0853    Education provided Yes   Education Details Pt instructed in turning techniques using RW; instructed to use RW for gait at all times.   Education Details Patient   Methods Explanation;Demonstration;Handout   Comprehension Verbalized understanding;Verbal cues required;Need further instruction          PT Short Term Goals - 04/28/14 0900    PT SHORT TERM GOAL #1   Title perform HEP with family's  supervision, for improved strength, balance, and gait.   Time 3   Period Days   Status On-going   PT SHORT TERM GOAL #2   Title perform at least 8 of 10 reps of sit<>stand transfers wtih minimal UE support, with no posterior/lateral lean or loss of balance, for improved safety and efficiency with transfers.   Time 3   Period Days   Status On-going   PT SHORT TERM GOAL #3   Title improve TUG score to less than or equal to 18 seconds for decreased fall risk.   Time 3   Period Days   Status Not Met   PT SHORT TERM GOAL #4   Title improve single limb stance to at least 3 seconds bilaterally, for improved balance and weighshifting.   Time 3   Period Days   Status On-going   PT SHORT TERM GOAL #5   Title verbalize with husband's assistance, at elast 3 means to decrease freezing episodes with gait.    Time 3   Period Days   Status On-going          PT Long Term Goals - 04/28/14 2542    PT LONG TERM GOAL #1   Title verbalize understanding of fall prevention strategies within home environment.   Time 4   Period Weeks   Status On-going   PT LONG TERM GOAL #2   Title improve 5x sit<>stand to less than or equal to 16 seconds for improved safety and efficiency with transfers.   Time 4   Period Weeks   Status On-going   PT LONG TERM GOAL #3   Title improve TUG score to less than or equal to 13.5 seocnds for decreased fall risk.   Time 4   Period Weeks   Status On-going   PT LONG TERM GOAL #4   Title improve gait velocity to at least 2.62 ft/sec for improved gait efficiency and safety.   Time 4   Period Weeks   Status On-going   PT LONG TERM GOAL #5   Title verbalize plans for continued community fitness upon D/C from PT   Time 4   Period Weeks   Status On-going          Plan - 04/28/14 0855    Clinical Impression Statement Pt presents to OP PT with large base quad cane today; therapist discusses safety concerns regarding cane and recommends RW use at all times.  Pt needs frequent cueing for turns with furniture and obstacles using RW.  Pt has not met STG #2.; remaining goals to be checked next visit   Pt will benefit from skilled therapeutic intervention in order to improve on the following deficits Abnormal gait;Decreased mobility;Decreased safety awareness;Decreased balance   PT Treatment/Interventions Gait training;Functional mobility training;Therapeutic exercise   PT Plan Check remaining short term goals; fall prevention, discuss plans beyond next week of therapy        Problem List Patient Active Problem List   Diagnosis Date Noted  . Edema 03/18/2014  . Parkinson's disease 10/30/2012  . Mild dementia 10/30/2012  . Stenosis, spinal, lumbar 05/19/2012  . BREAST CANCER 05/22/2010  . VITAMIN D DEFICIENCY 04/26/2008  . ANXIETY 11/09/2007  . GLAUCOMA  11/09/2007  . HYPERCHOLESTEROLEMIA 10/27/2007  . HYPOTHYROIDISM 05/01/2007  . HYPERTENSION 05/01/2007  . DEGENERATIVE JOINT DISEASE 05/01/2007  . DIABETES MELLITUS, BORDERLINE 05/01/2007  Kristin Mcenroe W. 04/28/2014, 9:11 AM

## 2014-04-30 ENCOUNTER — Encounter: Payer: Self-pay | Admitting: Physical Therapy

## 2014-04-30 ENCOUNTER — Ambulatory Visit: Payer: Medicare Other | Admitting: Physical Therapy

## 2014-04-30 DIAGNOSIS — R269 Unspecified abnormalities of gait and mobility: Secondary | ICD-10-CM

## 2014-04-30 DIAGNOSIS — Z9181 History of falling: Secondary | ICD-10-CM | POA: Diagnosis not present

## 2014-04-30 DIAGNOSIS — G20 Parkinson's disease: Secondary | ICD-10-CM | POA: Diagnosis not present

## 2014-04-30 NOTE — Therapy (Signed)
Physical Therapy Treatment  Patient Details  Name: [name] MRN: [id number] Date of Birth: [birth year]  Encounter Date: 04/30/2014      PT End of Session - 04/30/14 0819    Visit Number 7   Number of Visits 17   Date for PT Re-Evaluation 06/01/14   PT Start Time 0813  Pt arrives late   PT Stop Time 0849   PT Time Calculation (min) 36 min   Activity Tolerance --  Pt appears to have limited carryover of information from session to session      Past Medical History  Diagnosis Date  . Glaucoma   . Abnormal chest x-ray   . Hypertension   . Peripheral vascular disease   . Venous insufficiency   . Hypercholesteremia   . Hypothyroid   . Diverticulosis of colon   . DJD (degenerative joint disease)   . Hip pain   . Lumbar back pain   . Osteopenia   . Vitamin D deficiency   . Parkinson disease   . Anxiety   . Breast cancer     stage 0 left    Past Surgical History  Procedure Laterality Date  . Ovarian cyst removal    . Lapraoscopic cholecystectomy  09/2003    Dr. Dalbert Batman  . Lumbar laminectomy and foraminotomies for spinal stenosis  11/2005    Dr. Gladstone Lighter  . Cataract surgery and argon laser trabeculoplasty  04/2010    Dr. Herbert Deaner  . Colonoscopy    . Polypectomy    . Cholecystectomy    . Breast lumpectomy  8/11 by DrWakefield  . Incise and drain abcess  10/04/11    sebaceous cyst on back  . Lumbar laminectomy/decompression microdiscectomy  05/20/2012    Procedure: LUMBAR LAMINECTOMY/DECOMPRESSION MICRODISCECTOMY 2 LEVELS;  Surgeon: Tobi Bastos, MD;  Location: [facility];  Service: Orthopedics;  Laterality: N/A;    There were no vitals taken for this visit.  Visit Diagnosis:  Abnormality of gait          [facility] Adult PT Treatment/Exercise - 04/30/14 1027    Transfers   Transfers Sit to Stand;Stand to Sit  10 reps each from 20 inch/18 inch surface   Sit to Stand 4: Min guard  Has posterior lean in standing attempts   Sit to Stand Details (indicate cue type  and reason) cues for forward lean and upright standing to prevent posterior lean   Stand to Sit 5: Supervision   Balance   Balance Assessed Yes  Single limb stance:  Pt unable-has LOB          Education - 04/30/14 1032    Education provided Yes   Education Details Fall prevention, safety with gait, techniques to reduce freezing episodes with gait   Education Details Patient;Spouse   Methods Explanation;Handout   Comprehension Verbalized understanding          PT Short Term Goals - 04/30/14 0844    PT SHORT TERM GOAL #1   Title perform HEP with family's supervision, for improved strength, balance, and gait.   Status Not Met   PT SHORT TERM GOAL #2   Title perform at least 8 of 10 reps of sit<>stand transfers wtih minimal UE support, with no posterior/lateral lean or loss of balance, for improved safety and efficiency with transfers.   Status Not Met   PT SHORT TERM GOAL #3   Title improve TUG score to less than or equal to 18 seconds for decreased fall risk.   Status  Not Met   PT SHORT TERM GOAL #4   Title improve single limb stance to at least 3 seconds bilaterally, for improved balance and weighshifting.   Status Not Met   PT SHORT TERM GOAL #5   Title verbalize with husband's assistance, at elast 3 means to decrease freezing episodes with gait.   Status Achieved            Plan - 04/30/14 1036    Clinical Impression Statement Pt has not met STG # 1-4; STG #5 met.  Pt continues to have falls, with decreased safety awareness.  Discussed need for supervision anytime patient is up and walking.  Discussed limited progress with balance at this time due to decreased carryover from PT visits to home.   PT Plan Update HEP; plan for discharge next week; with recommendation given to patient/husband to follow up with neurologist for continued balance concerns to discuss medications/progression of pt's Parkinson's symptoms.        Problem List Patient Active Problem List    Diagnosis Date Noted  . Edema 03/18/2014  . Parkinson's disease 10/30/2012  . Mild dementia 10/30/2012  . Stenosis, spinal, lumbar 05/19/2012  . BREAST CANCER 05/22/2010  . VITAMIN D DEFICIENCY 04/26/2008  . ANXIETY 11/09/2007  . GLAUCOMA 11/09/2007  . HYPERCHOLESTEROLEMIA 10/27/2007  . HYPOTHYROIDISM 05/01/2007  . HYPERTENSION 05/01/2007  . DEGENERATIVE JOINT DISEASE 05/01/2007  . DIABETES MELLITUS, BORDERLINE 05/01/2007                                            Horris Speros W. 04/30/2014, 10:43 AM

## 2014-04-30 NOTE — Patient Instructions (Addendum)
Safety with walking  -Please make sure to use rolling walker at all times for safety  -Make sure that you have supervision of your husband or another person whenever you are up and walking around.    -Slow down and take your time with walking.  Tips to reduce freezing episodes with standing or walking:  1. Stand tall with your feet wide, so that you can rock and weight shift through your hips. 2. Don't try to fight the freeze: if you begin taking slower, faster, smaller steps, STOP, get your posture tall, and RESET your posture and balance.  Take a deep breath before taking the BIG step to start again. 3. March in place, with high knee stepping, to get started walking again. 4. Use auditory cues:  Count out loud, think of a familiar tune or song or cadence, use pocket metronome, to use rhythm to get started walking again. 5. Use visual cues:  Use a line to step over, use laser pointer line to step over, (using BIG steps) to start walking again. 6. Use visual targets to keep your posture tall (look ahead and focus on an object or target at eye level). 7. As you approach where your destination with walking, count your steps out loud and/or focus on your target with your eyes until you are fully there. 8. Use appropriate assistive device, as advised by your physical therapist to assist with taking longer, consistent steps.

## 2014-05-03 ENCOUNTER — Ambulatory Visit: Payer: Medicare Other | Admitting: Physical Therapy

## 2014-05-03 ENCOUNTER — Encounter: Payer: Self-pay | Admitting: Physical Therapy

## 2014-05-03 DIAGNOSIS — Z9181 History of falling: Secondary | ICD-10-CM | POA: Diagnosis not present

## 2014-05-03 DIAGNOSIS — R269 Unspecified abnormalities of gait and mobility: Secondary | ICD-10-CM

## 2014-05-03 DIAGNOSIS — G20 Parkinson's disease: Secondary | ICD-10-CM | POA: Diagnosis not present

## 2014-05-03 NOTE — Patient Instructions (Signed)
Heel Raise: Bilateral (Standing)   Rise on balls of feet. Repeat _10___ times per set. Do _2___ sets per session. Do _1-2_ sessions per day.  http://orth.exer.us/38   Copyright  VHI. All rights reserved.  Toe Raise (Standing)   Rock back on heels. Repeat __10__ times per set. Do __2__ sets per session. Do _1-2__ sessions per day.  http://orth.exer.us/42   Copyright  VHI. All rights reserved.  Single Step: Forward / Backward   Lifting foot off floor, take one step slowly forward with right leg. Return to starting position. Take one step backward and return. Repeat _10__ times per session. Do _1-2___ sessions per day. Repeat with other leg.  Copyright  VHI. All rights reserved.  Single Step: Side   Lifting foot off floor, take one step slowly to left side. Return to starting position. Repeat __10__ times per session. Do _1-2_ sessions per day. Repeat to opposite side.  Copyright  VHI. All rights reserved.   **MAKE SURE TO STAND AT THE COUNTER FOR SUPPORT.  MAKE SURE TO STEP DELIBERATELY OUT AND IN, CLEARING THE FLOOR.  YOU SHOULD HEAR YOUR FOOT "STOMP" BACK INTO PLACE.  YOUR FEET SHOULD BE SHOULDER WIDTH APART.  DO NOT LET YOUR FEET BE CLOSE TOGETHER!

## 2014-05-03 NOTE — Therapy (Signed)
Physical Therapy Treatment  Patient Details  Name: [name] MRN: [id number] Date of Birth: [birth year]  Encounter Date: 05/03/2014      PT End of Session - 05/03/14 0904    Visit Number 8   Number of Visits 17   Date for PT Re-Evaluation 06/01/14   PT Start Time 0814   PT Stop Time 0852   PT Time Calculation (min) 38 min      Past Medical History  Diagnosis Date  . Glaucoma   . Abnormal chest x-ray   . Hypertension   . Peripheral vascular disease   . Venous insufficiency   . Hypercholesteremia   . Hypothyroid   . Diverticulosis of colon   . DJD (degenerative joint disease)   . Hip pain   . Lumbar back pain   . Osteopenia   . Vitamin D deficiency   . Parkinson disease   . Anxiety   . Breast cancer     stage 0 left    Past Surgical History  Procedure Laterality Date  . Ovarian cyst removal    . Lapraoscopic cholecystectomy  09/2003    Dr. Dalbert Batman  . Lumbar laminectomy and foraminotomies for spinal stenosis  11/2005    Dr. Gladstone Lighter  . Cataract surgery and argon laser trabeculoplasty  04/2010    Dr. Herbert Deaner  . Colonoscopy    . Polypectomy    . Cholecystectomy    . Breast lumpectomy  8/11 by DrWakefield  . Incise and drain abcess  10/04/11    sebaceous cyst on back  . Lumbar laminectomy/decompression microdiscectomy  05/20/2012    Procedure: LUMBAR LAMINECTOMY/DECOMPRESSION MICRODISCECTOMY 2 LEVELS;  Surgeon: Tobi Bastos, MD;  Location: [facility];  Service: Orthopedics;  Laterality: N/A;    There were no vitals taken for this visit.  Visit Diagnosis:  Abnormality of gait          [facility] Adult PT Treatment/Exercise - 05/03/14 0818    Ambulation/Gait   Ambulation/Gait Yes   Ambulation/Gait Assistance 4: Min guard  Pt requires cues and assistance to avoid veering to L    Ambulation Distance (Feet) 80 Feet  then 100 ft, 15 ft x 2   Assistive device Rolling walker   Gait Pattern Decreased step length - right;Decreased step length - left;Shuffle   decreased foot clearance, veering to the left   Posture/Postural Control   Posture/Postural Control Postural limitations   Postural Limitations Rounded Shoulders;Forward head   Posture Comments Limits of stability with rhythmic stabilization at shoulders/hips x 10 reps; balance perturbations x 10 reps, in posterior direction  pt would lose balance in posterior direction without assist   High Level Balance   High Level Balance Activities --  Heel/toe raises 2 sets x 10   High Level Balance Comments Side,, back, forward step and weight shifts 10 reps each at counter  consecutive, then alternating          Education - 05/03/14 0901    Education provided Yes   Education Details HEP-see patient instructions; discussed need for patient to discuss continued frequent falling with Dr. Leta Baptist   Education Details Patient;Spouse   Methods Explanation;Demonstration;Verbal cues;Handout   Comprehension Need further instruction;Tactile cues required;Verbal cues required;Verbalized understanding              Plan - 05/03/14 0904    Clinical Impression Statement Pt has had 3 falls since PT visit on Friday.  During balance activities at counter, pt has multiple episodes of loss of balance, and  would fall without therapist's assistance.  Reiterated to patient's husband the need for constance supervision   Pt will benefit from skilled therapeutic intervention in order to improve on the following deficits Abnormal gait;Decreased balance;Decreased cognition;Decreased coordination;Decreased safety awareness   PT Next Visit Plan Check goals and plan for discharge next visit   Consulted and Agree with Plan of Care Patient;Family member/caregiver        Problem List Patient Active Problem List   Diagnosis Date Noted  . Edema 03/18/2014  . Parkinson's disease 10/30/2012  . Mild dementia 10/30/2012  . Stenosis, spinal, lumbar 05/19/2012  . BREAST CANCER 05/22/2010  . VITAMIN D DEFICIENCY  04/26/2008  . ANXIETY 11/09/2007  . GLAUCOMA 11/09/2007  . HYPERCHOLESTEROLEMIA 10/27/2007  . HYPOTHYROIDISM 05/01/2007  . HYPERTENSION 05/01/2007  . DEGENERATIVE JOINT DISEASE 05/01/2007  . DIABETES MELLITUS, BORDERLINE 05/01/2007                                            Anuar Walgren W. 05/03/2014, 9:09 AM

## 2014-05-04 ENCOUNTER — Telehealth: Payer: Self-pay

## 2014-05-04 NOTE — Telephone Encounter (Signed)
-----   Message from Penni Bombard, MD sent at 05/03/2014  9:34 AM EST ----- Regarding: RE: Pending discharge for patient [name], thank you for the update. We will setup appt for patient in a few weeks.   [name], please setup follow up appt with me in 2-4 weeks.   -VRP  ----- Message -----    From: Frazier Butt, PT    Sent: 05/03/2014   9:21 AM      To: Penni Bombard, MD Subject: Pending discharge for patient                  Dr. Leta Baptist, I wanted to let you know that [name] has been seen for physical therapy for about 5 weeks.  She is having continued, frequent falls. (Her husband reports she had 3 falls over the weekend since PT visit this past Friday, with several falls last week as well.)  There does not appear to be significant carryover with therapy activities and instruction to home.  Because she is not making significant progress with therapy, we are planning to discharge her later this week.  Due to her continued and frequent falls, would it be appropriate for you to follow up with her for an appointment prior to her scheduled February 2016 appointment?    Thank you,  Mady Haagensen, PT

## 2014-05-04 NOTE — Telephone Encounter (Signed)
Spoke to spouse. Scheduled appt in 2 weeks per his request.

## 2014-05-07 ENCOUNTER — Ambulatory Visit: Payer: Medicare Other | Admitting: Physical Therapy

## 2014-05-13 ENCOUNTER — Encounter: Payer: Self-pay | Admitting: Physical Therapy

## 2014-05-13 ENCOUNTER — Ambulatory Visit: Payer: Medicare Other | Admitting: Physical Therapy

## 2014-05-13 DIAGNOSIS — R269 Unspecified abnormalities of gait and mobility: Secondary | ICD-10-CM

## 2014-05-13 DIAGNOSIS — Z9181 History of falling: Secondary | ICD-10-CM | POA: Diagnosis not present

## 2014-05-13 DIAGNOSIS — G20 Parkinson's disease: Secondary | ICD-10-CM | POA: Diagnosis not present

## 2014-05-13 NOTE — Therapy (Signed)
Physical Therapy Treatment  Patient Details  Name: [name] MRN: [id number] Date of Birth: [birth year]  Encounter Date: 05/13/2014      PT End of Session - 05/13/14 0813    Visit Number 9  G9   Number of Visits 9   Date for PT Re-Evaluation --  Pt discharging today 05/13/14   PT Start Time 0812  Pt arrived late   PT Stop Time 0843   PT Time Calculation (min) 31 min      Past Medical History  Diagnosis Date  . Glaucoma   . Abnormal chest x-ray   . Hypertension   . Peripheral vascular disease   . Venous insufficiency   . Hypercholesteremia   . Hypothyroid   . Diverticulosis of colon   . DJD (degenerative joint disease)   . Hip pain   . Lumbar back pain   . Osteopenia   . Vitamin D deficiency   . Parkinson disease   . Anxiety   . Breast cancer     stage 0 left    Past Surgical History  Procedure Laterality Date  . Ovarian cyst removal    . Lapraoscopic cholecystectomy  09/2003    Dr. Dalbert Batman  . Lumbar laminectomy and foraminotomies for spinal stenosis  11/2005    Dr. Gladstone Lighter  . Cataract surgery and argon laser trabeculoplasty  04/2010    Dr. Herbert Deaner  . Colonoscopy    . Polypectomy    . Cholecystectomy    . Breast lumpectomy  8/11 by DrWakefield  . Incise and drain abcess  10/04/11    sebaceous cyst on back  . Lumbar laminectomy/decompression microdiscectomy  05/20/2012    Procedure: LUMBAR LAMINECTOMY/DECOMPRESSION MICRODISCECTOMY 2 LEVELS;  Surgeon: Tobi Bastos, MD;  Location: [facility];  Service: Orthopedics;  Laterality: N/A;    There were no vitals taken for this visit.  Visit Diagnosis:  Abnormality of gait      Subjective Assessment - 05/13/14 0814    Symptoms Feel better than last week (pt had cancelled Friday visit).  Reports no falls in the past few days.   Currently in Pain? No/denies          [facility] PT Assessment - 05/13/14 0820    Transfers   Transfers Sit to Stand;Stand to Sit   Sit to Stand 5: Supervision;Without upper extremity  assist;Five times sit to stand  5x sit<>stand 11.89 sec   Stand to Sit 5: Supervision;Without upper extremity assist   Ambulation/Gait   Ambulation/Gait Yes   Ambulation/Gait Assistance 5: Supervision   Assistive device Rolling walker   Gait velocity 15.01 sec in 10 meter walk:  2.19 ft/sec   Standardized Balance Assessment   Standardized Balance Assessment Timed Up and Go Test   Timed Up and Go Test   TUG Normal TUG   Normal TUG (seconds) 24.63  RW          [facility] Adult PT Treatment/Exercise - 05/13/14 0820    High Level Balance   High Level Balance Activities --  Heel/toe raises x 10   High Level Balance Comments Side,, back, forward step and weight shifts 10 reps each at counter          PT Education - 05/13/14 0846    Education provided Yes   Education Details Community fitness options, reviewed HEP; discussed d/c plans   Person(s) Educated Patient;Spouse   Methods Explanation;Demonstration;Handout   Comprehension Verbalized understanding;Returned demonstration  PT Long Term Goals - June 01, 2014 1151    PT LONG TERM GOAL #1   Title verbalize understanding of fall prevention strategies within home environment.   Status Achieved   PT LONG TERM GOAL #2   Title improve 5x sit<>stand to less than or equal to 16 seconds for improved safety and efficiency with transfers.   Status Achieved   PT LONG TERM GOAL #3   Title improve TUG score to less than or equal to 13.5 seocnds for decreased fall risk.   Status Not Met  TUG score 24.63 sec   PT LONG TERM GOAL #4   Title improve gait velocity to at least 2.62 ft/sec for improved gait efficiency and safety.   Status Not Met  gait velocity 2.19 ft/sec   PT LONG TERM GOAL #5   Title verbalize plans for continued community fitness upon D/C from PT   Status Achieved          Plan - 2014/06/01 1154    Clinical Impression Statement Pt has met LTG# 1, 2, 5.  LTG # 3 and 4 not met.  Pt is having continued falls and  requires continued superivision of husband for safety.  PT has contacted neurologist to notify of continued falls despite PT treatment, and pt has visit next week.  Pt is appropriate for discharge from PT at this time due to maximizing rehab potential.   PT Next Visit Plan Pt is discharged from PT as of this date.          G-Codes - 01-Jun-2014 1157    Functional Assessment Tool Used TUG, gait velocity, 5x sit<>stand   Functional Limitation Mobility: Walking and moving around   Mobility: Walking and Moving Around Goal Status [phone]) At least 60 percent but less than 80 percent impaired, limited or restricted   Mobility: Walking and Moving Around Discharge Status [phone]) At least 60 percent but less than 80 percent impaired, limited or restricted      Problem List Patient Active Problem List   Diagnosis Date Noted  . Edema 03/18/2014  . Parkinson's disease 10/30/2012  . Mild dementia 10/30/2012  . Stenosis, spinal, lumbar 05/19/2012  . BREAST CANCER 05/22/2010  . VITAMIN D DEFICIENCY 04/26/2008  . ANXIETY 11/09/2007  . GLAUCOMA 11/09/2007  . HYPERCHOLESTEROLEMIA 10/27/2007  . HYPOTHYROIDISM 05/01/2007  . HYPERTENSION 05/01/2007  . DEGENERATIVE JOINT DISEASE 05/01/2007  . DIABETES MELLITUS, BORDERLINE 05/01/2007       PHYSICAL THERAPY DISCHARGE SUMMARY  Visits from Start of Care: 9    Remaining deficits: Falls, decreased timing and coordination of gait, decreased balance   Education / Equipment: Pt/husband educated in HEP, fall prevention, continued community fitness upon D/C from PT. Plan: Patient agrees to discharge.  Patient goals were partially met. Patient is being discharged due to lack of progress.  ?????                                            Markelle Najarian W. 2014-06-01, 12:03 PM

## 2014-05-13 NOTE — Patient Instructions (Signed)

## 2014-05-17 ENCOUNTER — Other Ambulatory Visit: Payer: Self-pay | Admitting: Pulmonary Disease

## 2014-05-18 ENCOUNTER — Encounter: Payer: Self-pay | Admitting: Diagnostic Neuroimaging

## 2014-05-18 ENCOUNTER — Ambulatory Visit (INDEPENDENT_AMBULATORY_CARE_PROVIDER_SITE_OTHER): Payer: Medicare Other | Admitting: Diagnostic Neuroimaging

## 2014-05-18 VITALS — BP 102/57 | HR 77 | Ht 60.0 in | Wt 96.6 lb

## 2014-05-18 DIAGNOSIS — F0390 Unspecified dementia without behavioral disturbance: Secondary | ICD-10-CM

## 2014-05-18 DIAGNOSIS — G20 Parkinson's disease: Secondary | ICD-10-CM

## 2014-05-18 DIAGNOSIS — F03A0 Unspecified dementia, mild, without behavioral disturbance, psychotic disturbance, mood disturbance, and anxiety: Secondary | ICD-10-CM

## 2014-05-18 NOTE — Progress Notes (Signed)
[facility]  PATIENT: [name] DOB: 04-Feb-1941  REFERRING CLINICIAN:  HISTORY FROM: patient and husband REASON FOR VISIT: follow up   HISTORICAL  CHIEF COMPLAINT:  Chief Complaint  Patient presents with  . Follow-up    PD    HISTORY OF PRESENT ILLNESS:   UPDATE 05/18/14: Since last visit, many more falls, usually when patient is alone and tries to walk down steps or take a shower unsupervised. Husband has sitter now when he needs to go out. Not much benefit with azilect. Tremor under control. Some head nodding behavior.   UPDATE 02/22/14: Since last visit, more falls. No warning. Has occurred with getting out of bed, bending over, making the bed. Husband hears a "thud" in the other room and finds her on the floor, no confusion or LOC. Hallucinations stable. No wearing off except in early AM before 1st dose stalevo.   UPDATE 12/11/13: Since last visit, was stable until 3 weeks ago. Now with more confusion, intermittent, sometimes not recognizing her family. Sometimes she [place] is [place] the bus [place], even though she is standing in her home. No fevers, chills, cough, diarrhea, pain, SOB, CP, N, V. Non threatening visual hallucinations are stable. Memory worsening. Could not tolerate exelon patch (slightly paranoid about its effects).   UPDATE 10/30/12: Since last visit patient is stable. Reduced dosing of Stalevo seems to have improved her hallucinations, but her handwriting has worsened. She still sees children and people in her home, but according to the husband this is much less frequent. He has had to adjust some of the objects in his home which were bothering her. For example plants or water heaters high up in the room, or radiators on the floor, seem to bother her more. Once he removed these from her view she felt better. Tremor, gait and balance are stable. No falls. She does have more stooped posture than before but this seems to be stable since her  back surgery. She is having some or drooling. She's having more runny nose. She keeps a napkin with her to wipe her nose. Patient's husband reports more short-term memory loss than before.  UPDATE 09/05/12: Since last visit, has reduced stalevo to QID. no change in hallucinations or parkinson's symptoms. until 1 month ago, some progression of confusion, memory, hallucinations and paranoia. this fluctuates throughout the day.  UPDATE 07/07/12: Since last visit, tried seroquel x 2 weeks, but hallucinations worsened. Now off seroquel. Also had low back surgery (nov 2013), and went to rehab. Now back home. Stalevo dosing stable.  UPDATE 04/16/12: Never picked up seroquel after last visit. she didn't know that i rx'd this, even though we discussed it. will try rx again. hallucinations are stable. some more stiffness in hips lately. on 5 tabs stalevo per day.  UPDATE 08/14/11:  Continues to have hallucinations, mostly at night, denies being afraid but feels they are bothersome.  Has not thought about calling 911.  Realizes they are hallucinations.  Took Stalevo 4 times per day for one week without improvement of hallucinations, no change in parkinson's symptoms nowback up to 5 times a day.  She is interested in trying other medications for relief.  Also verbalizes concerns of choking episodes in her sleep.  Occurs approximately 3-4 times per month.  Denies choking episodes with eating or drinking.  PRIOR HPI: 73 year old female with history of hypothyroidism, hypertension, peripheral vascular disease presenting for evaluation and management of Parkinson's disease.  She is accompanied by her  husband. In 2004 patient began to develop tremor in her bilateral upper extremities. She was diagnosed with Parkinson's disease. She was initially treated with Mirapex and Sinemet by Dr. Jacolyn Reedy ([place]).  She began to develop hallucinations and was ultimately changed to Stalevo by Dr. Marzetta Board [facility]).  She has done fairly  well on this medication. She takes Stalevo 37.5/150/200, 1 tab five times a day (7am, 11am, 3pm, 7pm, bedtime).  She notices wearing off around 4 hours after her dose. She has not had any falls. She denies hallucinations or constipation.  She would like to re-establish care with a local neurologist.   REVIEW OF SYSTEMS: Full 14 system review of systems performed and notable only for memory loss hallucinations confusion gait diff.   ALLERGIES: No Known Allergies  HOME MEDICATIONS: Outpatient Prescriptions Prior to Visit  Medication Sig Dispense Refill  . carbidopa-levodopa-entacapone (STALEVO) 25-100-200 MG per tablet TAKE 1 TABLET BY MOUTH 4 (FOUR) TIMES DAILY. 120 tablet 12  . clopidogrel (PLAVIX) 75 MG tablet Take 75 mg by mouth daily.    Marland Kitchen lisinopril (PRINIVIL,ZESTRIL) 10 MG tablet Take 10 mg by mouth daily.    . rasagiline (AZILECT) 0.5 MG TABS tablet Take 1 tablet (0.5 mg total) by mouth daily. 30 tablet 12  . azelastine (OPTIVAR) 0.05 % ophthalmic solution 1 drop 2 (two) times daily.    . Calcium Carbonate-Vitamin D (CALTRATE 600+D) 600-400 MG-UNIT per tablet Take 1 tablet by mouth daily.     Marland Kitchen levothyroxine (SYNTHROID, LEVOTHROID) 88 MCG tablet Take 1 tablet (88 mcg total) by mouth daily. 90 tablet 1  . torsemide (DEMADEX) 10 MG tablet Take 1 tablet (10 mg total) by mouth daily. 90 tablet 1   No facility-administered medications prior to visit.    PAST MEDICAL HISTORY: Past Medical History  Diagnosis Date  . Glaucoma   . Abnormal chest x-ray   . Hypertension   . Peripheral vascular disease   . Venous insufficiency   . Hypercholesteremia   . Hypothyroid   . Diverticulosis of colon   . DJD (degenerative joint disease)   . Hip pain   . Lumbar back pain   . Osteopenia   . Vitamin D deficiency   . Parkinson disease   . Anxiety   . Breast cancer     stage 0 left    PAST SURGICAL HISTORY: Past Surgical History  Procedure Laterality Date  . Ovarian cyst removal    .  Lapraoscopic cholecystectomy  09/2003    Dr. Dalbert Batman  . Lumbar laminectomy and foraminotomies for spinal stenosis  11/2005    Dr. Gladstone Lighter  . Cataract surgery and argon laser trabeculoplasty  04/2010    Dr. Herbert Deaner  . Colonoscopy    . Polypectomy    . Cholecystectomy    . Breast lumpectomy  8/11 by DrWakefield  . Incise and drain abcess  10/04/11    sebaceous cyst on back  . Lumbar laminectomy/decompression microdiscectomy  05/20/2012    Procedure: LUMBAR LAMINECTOMY/DECOMPRESSION MICRODISCECTOMY 2 LEVELS;  Surgeon: Tobi Bastos, MD;  Location: [facility];  Service: Orthopedics;  Laterality: N/A;    FAMILY HISTORY: Family History  Problem Relation Age of Onset  . Colon cancer Father     SOCIAL HISTORY:  History   Social History  . Marital Status: Married    Spouse Name: [name]    Number of Children: 2  . Years of Education: Assoc   Occupational History  . retired   .  Social History Main Topics  . Smoking status: Never Smoker   . Smokeless tobacco: Never Used  . Alcohol Use: No  . Drug Use: No  . Sexual Activity: Not Currently   Other Topics Concern  . Not on file   Social History Narrative   Pt lives at home with spouse.   Caffeine Use: Very little; once a week     PHYSICAL EXAM  Filed Vitals:   05/18/14 1323  BP: 102/57  Pulse: 77  Height: 5' (1.524 m)  Weight: 96 lb 9.6 oz (43.817 kg)   Body mass index is 18.87 kg/(m^2).  EXAM: General: Patient is awake, alert and in no acute distress.  Well developed and groomed. CONSTANT ROCKING HEAD NODDING MOVEMENTS.  Neck: Neck is supple. Cardiovascular: No carotid artery bruits.  Heart is regular rate and rhythm with no murmurs.  Neurologic Exam  Mental Status: Awake, alert.  Language has dec fluency. Comprehension intact. PLEASANT, CALM, COOPERATIVE. SOFT SPOKEN. MMSE 21/30. AFT 3. GDS 4. Cranial Nerves: Pupils are POST-SURGICAL.  Visual fields are full to confrontation.  Conjugate eye movements are full and  symmetric.  Facial sensation and strength are symmetric.  Hearing is intact.  Palate elevated symmetrically and uvula is midline.  Shoulder shug is symmetric.  Tongue is midline.  MASKED FACIES.  POSITIVE MYERSON'S SIGN. Motor: Normal bulk and MOD COGWHEELING IN BUE. NO TREMOR. BRADYKINESIA IN BUE AND BLE (LEFT WORSE THAN RIGHT). Full strength in the upper and lower extremities.  No pronator drift. Sensory: Intact and symmetric to light touch. Coordination: No ataxia or dysmetria on finger-nose or rapid alternating movement testing.  SLOW RAM. Gait and Station: Narrow based gait; SLIGHT STOOPED POSTURE. SLOW SHORT STEPS. USES SINGLE POINT CANE.  Reflexes: Deep tendon reflexes in the upper and lower extremity are TRACE and symmetric.   DIAGNOSTIC DATA (LABS, IMAGING, TESTING) - I reviewed patient records, labs, notes, testing and imaging myself where available.  Lab Results  Component Value Date   WBC 4.4 03/05/2014   HGB 11.6* 03/05/2014   HCT 36.7 03/05/2014   MCV 97.9 03/05/2014   PLT 199 03/05/2014      Component Value Date/Time   NA 142 03/18/2014 0946   NA 145 02/25/2014 0815   NA 145* 12/11/2013 0942   K 4.3 03/18/2014 0946   K 4.0 02/25/2014 0815   CL 105 03/18/2014 0946   CL 107 12/08/2012 0926   CO2 31 03/18/2014 0946   CO2 27 02/25/2014 0815   GLUCOSE 99 03/18/2014 0946   GLUCOSE 110 02/25/2014 0815   GLUCOSE 104* 12/11/2013 0942   GLUCOSE 123* 12/08/2012 0926   BUN 14 03/18/2014 0946   BUN 15.1 02/25/2014 0815   BUN 14 12/11/2013 0942   CREATININE 0.8 03/18/2014 0946   CREATININE 0.8 02/25/2014 0815   CALCIUM 9.3 03/18/2014 0946   CALCIUM 8.8 02/25/2014 0815   PROT 5.9* 03/05/2014 1938   PROT 6.1* 02/25/2014 0815   PROT 6.7 12/11/2013 0942   ALBUMIN 3.5 03/05/2014 1938   ALBUMIN 3.6 02/25/2014 0815   AST 19 03/05/2014 1938   AST 16 02/25/2014 0815   ALT <5 03/05/2014 1938   ALT 6 02/25/2014 0815   ALKPHOS 62 03/05/2014 1938   ALKPHOS 64 02/25/2014 0815    BILITOT 0.7 03/05/2014 1938   BILITOT 0.97 02/25/2014 0815   GFRNONAA 82* 03/05/2014 1938   GFRAA >90 03/05/2014 1938   Lab Results  Component Value Date   CHOL 166 09/01/2013   HDL  64.50 09/01/2013   LDLCALC 96 09/01/2013   LDLDIRECT 142.2 10/28/2008   TRIG 26.0 09/01/2013   CHOLHDL 3 09/01/2013   Lab Results  Component Value Date   HGBA1C 6.0 09/01/2013   No results found for: VITAMINB12 Lab Results  Component Value Date   TSH 8.16* 03/18/2014    09/09/12 MRI brain - Mild perisylvian and moderate temporal atrophy. Moderate periventricular and subcortical chronic small vessel ischemic disease. No significant change from prior MRI on 05/08/11.   ASSESSMENT AND PLAN  73 y.o. female with history of hypothyroidism, hypertension, peripheral vascular disease presenting for evaluation and management of Parkinson's disease. She takes Stalevo 25/100/200, 1 tab 4 times a day. Non-threatening visual hallucinations are stable. More falls lately. Unfortunately, she has poor insight and did not benefit from PT.  PLAN: 1. Stop azilect (was not effective) 2. Continue stalevo 25/100/200 4 tabs per day   Return in about 6 months (around 11/16/2014).     Penni Bombard, MD 16/03/9603, 5:40 PM Certified in Neurology, Neurophysiology and Neuroimaging  [facility] [address], [place], [place] [address] [phone]

## 2014-05-18 NOTE — Patient Instructions (Signed)
Stop azilect.  Use walker.  Continue 24 hour supervision.

## 2014-05-21 ENCOUNTER — Emergency Department (HOSPITAL_COMMUNITY)
Admission: EM | Admit: 2014-05-21 | Discharge: 2014-05-22 | Disposition: A | Discharge status: Home / Self Care | Payer: Medicare Other | Attending: Emergency Medicine | Admitting: Emergency Medicine

## 2014-05-21 ENCOUNTER — Emergency Department (HOSPITAL_COMMUNITY): Payer: Medicare Other

## 2014-05-21 ENCOUNTER — Encounter (HOSPITAL_COMMUNITY): Payer: Self-pay | Admitting: Emergency Medicine

## 2014-05-21 DIAGNOSIS — M1990 Unspecified osteoarthritis, unspecified site: Secondary | ICD-10-CM | POA: Diagnosis not present

## 2014-05-21 DIAGNOSIS — W08XXXA Fall from other furniture, initial encounter: Secondary | ICD-10-CM | POA: Insufficient documentation

## 2014-05-21 DIAGNOSIS — Z8669 Personal history of other diseases of the nervous system and sense organs: Secondary | ICD-10-CM | POA: Diagnosis not present

## 2014-05-21 DIAGNOSIS — I739 Peripheral vascular disease, unspecified: Secondary | ICD-10-CM | POA: Diagnosis not present

## 2014-05-21 DIAGNOSIS — G20 Parkinson's disease: Secondary | ICD-10-CM | POA: Diagnosis not present

## 2014-05-21 DIAGNOSIS — Z79899 Other long term (current) drug therapy: Secondary | ICD-10-CM | POA: Insufficient documentation

## 2014-05-21 DIAGNOSIS — Z7902 Long term (current) use of antithrombotics/antiplatelets: Secondary | ICD-10-CM | POA: Diagnosis not present

## 2014-05-21 DIAGNOSIS — S59901A Unspecified injury of right elbow, initial encounter: Secondary | ICD-10-CM | POA: Diagnosis not present

## 2014-05-21 DIAGNOSIS — Z853 Personal history of malignant neoplasm of breast: Secondary | ICD-10-CM | POA: Insufficient documentation

## 2014-05-21 DIAGNOSIS — Y998 Other external cause status: Secondary | ICD-10-CM | POA: Diagnosis not present

## 2014-05-21 DIAGNOSIS — M79621 Pain in right upper arm: Secondary | ICD-10-CM | POA: Diagnosis not present

## 2014-05-21 DIAGNOSIS — Y9389 Activity, other specified: Secondary | ICD-10-CM | POA: Diagnosis not present

## 2014-05-21 DIAGNOSIS — Z7982 Long term (current) use of aspirin: Secondary | ICD-10-CM | POA: Insufficient documentation

## 2014-05-21 DIAGNOSIS — F419 Anxiety disorder, unspecified: Secondary | ICD-10-CM | POA: Diagnosis not present

## 2014-05-21 DIAGNOSIS — S5011XA Contusion of right forearm, initial encounter: Secondary | ICD-10-CM | POA: Insufficient documentation

## 2014-05-21 DIAGNOSIS — I10 Essential (primary) hypertension: Secondary | ICD-10-CM | POA: Diagnosis not present

## 2014-05-21 DIAGNOSIS — S4991XA Unspecified injury of right shoulder and upper arm, initial encounter: Secondary | ICD-10-CM | POA: Diagnosis not present

## 2014-05-21 DIAGNOSIS — S40021A Contusion of right upper arm, initial encounter: Secondary | ICD-10-CM

## 2014-05-21 DIAGNOSIS — Y9289 Other specified places as the place of occurrence of the external cause: Secondary | ICD-10-CM | POA: Diagnosis not present

## 2014-05-21 DIAGNOSIS — W19XXXA Unspecified fall, initial encounter: Secondary | ICD-10-CM

## 2014-05-21 DIAGNOSIS — T1490XA Injury, unspecified, initial encounter: Secondary | ICD-10-CM

## 2014-05-21 DIAGNOSIS — E039 Hypothyroidism, unspecified: Secondary | ICD-10-CM | POA: Diagnosis not present

## 2014-05-21 DIAGNOSIS — M25521 Pain in right elbow: Secondary | ICD-10-CM | POA: Diagnosis not present

## 2014-05-21 MED ORDER — IBUPROFEN 800 MG PO TABS
800.0000 mg | ORAL_TABLET | Freq: Once | ORAL | Status: AC
  Administered 2014-05-22: 800 mg via ORAL
  Filled 2014-05-21: qty 1

## 2014-05-21 NOTE — ED Provider Notes (Signed)
CSN: [id number]     Arrival date & time 05/21/14  2250 History   First MD Initiated Contact with Patient 05/21/14 2332     Chief Complaint  Patient presents with  . Arm Injury     (Consider location/radiation/quality/duration/timing/severity/associated sxs/prior Treatment) HPI Comments: The patient is a 73 year old female who has a history of Parkinson's disease, she is routinely falling due to her lack of balance and according to family today as she tried to stand up from a sitting position at the table she lost her balance falling and landing on her right upper extremity. She denies any head injury or neck pain but does complain of pain at the right elbow forearm and distal humerus. She has decreased range of motion at the right arm secondary to pain. His symptoms were acute in onset, persistent, worse with range of motion. She denies any bleeding or open wounds.  Patient is a 73 y.o. female presenting with arm injury. The history is provided by the patient.  Arm Injury Associated symptoms: no back pain and no neck pain     Past Medical History  Diagnosis Date  . Glaucoma   . Abnormal chest x-ray   . Hypertension   . Peripheral vascular disease   . Venous insufficiency   . Hypercholesteremia   . Hypothyroid   . Diverticulosis of colon   . DJD (degenerative joint disease)   . Hip pain   . Lumbar back pain   . Osteopenia   . Vitamin D deficiency   . Parkinson disease   . Anxiety   . Breast cancer     stage 0 left   Past Surgical History  Procedure Laterality Date  . Ovarian cyst removal    . Lapraoscopic cholecystectomy  09/2003    Dr. Dalbert Batman  . Lumbar laminectomy and foraminotomies for spinal stenosis  11/2005    Dr. Gladstone Lighter  . Cataract surgery and argon laser trabeculoplasty  04/2010    Dr. Herbert Deaner  . Colonoscopy    . Polypectomy    . Cholecystectomy    . Breast lumpectomy  8/11 by [facility]  . Incise and drain abcess  10/04/11    sebaceous cyst on back  . Lumbar  laminectomy/decompression microdiscectomy  05/20/2012    Procedure: LUMBAR LAMINECTOMY/DECOMPRESSION MICRODISCECTOMY 2 LEVELS;  Surgeon: Tobi Bastos, MD;  Location: [facility];  Service: Orthopedics;  Laterality: N/A;   Family History  Problem Relation Age of Onset  . Colon cancer Father    History  Substance Use Topics  . Smoking status: Never Smoker   . Smokeless tobacco: Never Used  . Alcohol Use: No   OB History    No data available     Review of Systems  Gastrointestinal: Negative for nausea and vomiting.  Musculoskeletal: Positive for joint swelling (Right elbow). Negative for back pain and neck pain.  Neurological: Negative for weakness and numbness.      Allergies  Review of patient's allergies indicates no known allergies.  Home Medications   Prior to Admission medications   Medication Sig Start Date End Date Taking? Authorizing Provider  aspirin 81 MG tablet Take 81 mg by mouth daily.   Yes Historical Provider, MD  carbidopa-levodopa-entacapone (STALEVO) 25-100-200 MG per tablet TAKE 1 TABLET BY MOUTH 4 (FOUR) TIMES DAILY. 01/18/14  Yes Penni Bombard, MD  clopidogrel (PLAVIX) 75 MG tablet Take 75 mg by mouth daily.   Yes Historical Provider, MD  levothyroxine (SYNTHROID, LEVOTHROID) 75 MCG tablet Take 1 tablet  by mouth daily. 02/27/14  Yes Historical Provider, MD  lisinopril (PRINIVIL,ZESTRIL) 10 MG tablet Take 10 mg by mouth daily.   Yes Historical Provider, MD  traMADol (ULTRAM) 50 MG tablet Take 1 tablet (50 mg total) by mouth every 6 (six) hours as needed. 05/22/14   Johnna Acosta, MD   BP 136/70 mmHg  Pulse 89  Temp(Src) 98.3 F (36.8 C) (Oral)  Resp 16  Ht 5' (1.524 m)  Wt 96 lb (43.545 kg)  BMI 18.75 kg/m2  SpO2 100% Physical Exam  Constitutional: She appears well-developed and well-nourished. No distress.  HENT:  Head: Normocephalic and atraumatic.  Eyes: Conjunctivae are normal. No scleral icterus.  Cardiovascular: Normal rate, regular  rhythm and intact distal pulses.   Pulmonary/Chest: Effort normal and breath sounds normal.  Musculoskeletal: She exhibits tenderness ( Tenderness located over the right elbow, radial head, decreased range of motion, no swelling around the right shoulder or wrist). She exhibits no edema.  Normal pulses of the right wrist  Neurological: She is alert.  Normal sensation to light touch of the right upper extremity  Skin: Skin is warm and dry. No rash noted. She is not diaphoretic.  Nursing note and vitals reviewed.   ED Course  Procedures (including critical care time) Labs Review Labs Reviewed - No data to display  Imaging Review Dg Elbow Complete Right  05/22/2014   CLINICAL DATA:  Status post fall onto right side, with right lateral and posterior elbow pain, acute onset. Initial encounter.  EXAM: RIGHT ELBOW - COMPLETE 3+ VIEW  COMPARISON:  None.  FINDINGS: There is no evidence of fracture or dislocation. Mild degenerative change is noted at the coronoid process. The visualized joint spaces are preserved. No significant joint effusion is identified. The soft tissues are unremarkable in appearance.  IMPRESSION: No evidence of fracture or dislocation.   Electronically Signed   By: Garald Balding M.D.   On: 05/22/2014 00:35   Dg Humerus Right  05/22/2014   CLINICAL DATA:  Status post fall onto right side, with acute onset of right proximal anterolateral humerus pain. Initial encounter.  EXAM: RIGHT HUMERUS - 2+ VIEW  COMPARISON:  None.  FINDINGS: There is no evidence fracture or dislocation. The humerus appears intact. Apparent irregularity at the proximal radius reflects degenerative change about the coronoid process, on correlation with concurrent elbow radiographs.  The right humeral head remains seated in the glenoid fossa. The right acromioclavicular joint is grossly unremarkable in appearance. The visualized portions of the right lung are clear. No significant soft tissue abnormalities are  characterized on radiograph.  IMPRESSION: No evidence of fracture or dislocation.   Electronically Signed   By: Garald Balding M.D.   On: 05/22/2014 00:37      MDM   Final diagnoses:  Trauma  Contusion of right arm, initial encounter    Rule out fracture of the elbow and humerus, pain medications given, ice, immobilization.  xrays negative,  Ice, immob, and home with f/u.  Pt and family in agreement.  Meds given in ED:  Medications  ibuprofen (ADVIL,MOTRIN) tablet 800 mg (not administered)    New Prescriptions   TRAMADOL (ULTRAM) 50 MG TABLET    Take 1 tablet (50 mg total) by mouth every 6 (six) hours as needed.      Johnna Acosta, MD 05/22/14 [phone]

## 2014-05-21 NOTE — ED Notes (Signed)
Pt presents with R arm/elbow pain onset 2030 tonight after attempting to stand from the dinner table per family.

## 2014-05-22 DIAGNOSIS — M25521 Pain in right elbow: Secondary | ICD-10-CM | POA: Diagnosis not present

## 2014-05-22 DIAGNOSIS — S5011XA Contusion of right forearm, initial encounter: Secondary | ICD-10-CM | POA: Diagnosis not present

## 2014-05-22 DIAGNOSIS — S59901A Unspecified injury of right elbow, initial encounter: Secondary | ICD-10-CM | POA: Diagnosis not present

## 2014-05-22 DIAGNOSIS — M79621 Pain in right upper arm: Secondary | ICD-10-CM | POA: Diagnosis not present

## 2014-05-22 DIAGNOSIS — S4991XA Unspecified injury of right shoulder and upper arm, initial encounter: Secondary | ICD-10-CM | POA: Diagnosis not present

## 2014-05-22 MED ORDER — TRAMADOL HCL 50 MG PO TABS: 50.0000 mg | ORAL_TABLET | Freq: Four times a day (QID) | ORAL | Status: DC | PRN

## 2014-05-22 NOTE — Discharge Instructions (Signed)
xrays are negative for fracture - you have some bruising / contusions  This should heal over the next couple of days to a week.  Ice intermittently, motrin for pain, ultram for severe pain.  Please call your doctor for a followup appointment within 24-48 hours. When you talk to your doctor please let them know that you were seen in the emergency department and have them acquire all of your records so that they can discuss the findings with you and formulate a treatment plan to fully care for your new and ongoing problems.

## 2014-06-15 ENCOUNTER — Ambulatory Visit: Payer: Medicare Other | Admitting: Diagnostic Neuroimaging

## 2014-06-16 ENCOUNTER — Telehealth: Payer: Self-pay

## 2014-06-16 NOTE — Telephone Encounter (Signed)
Called spouse to let him know letter of request is ready for pick up or to be mailed. No answer. Will try later.

## 2014-06-21 ENCOUNTER — Other Ambulatory Visit: Payer: Self-pay | Admitting: Pulmonary Disease

## 2014-06-23 ENCOUNTER — Other Ambulatory Visit: Payer: Self-pay | Admitting: Internal Medicine

## 2014-06-23 NOTE — Telephone Encounter (Signed)
Spoke to spouse. He requested that I mail letter. Placed letter in medical records basket to go out 06/24/14.

## 2014-07-08 DIAGNOSIS — N63 Unspecified lump in breast: Secondary | ICD-10-CM | POA: Diagnosis not present

## 2014-07-08 DIAGNOSIS — Z853 Personal history of malignant neoplasm of breast: Secondary | ICD-10-CM | POA: Diagnosis not present

## 2014-07-08 LAB — HM MAMMOGRAPHY: HM Mammogram: ABNORMAL

## 2014-07-13 ENCOUNTER — Telehealth: Payer: Self-pay | Admitting: Internal Medicine

## 2014-07-13 ENCOUNTER — Ambulatory Visit: Payer: Medicare Other | Admitting: Internal Medicine

## 2014-07-13 NOTE — Telephone Encounter (Signed)
No showed for fu.  Please advise.

## 2014-07-14 DIAGNOSIS — E119 Type 2 diabetes mellitus without complications: Secondary | ICD-10-CM | POA: Diagnosis not present

## 2014-07-14 DIAGNOSIS — H4011X1 Primary open-angle glaucoma, mild stage: Secondary | ICD-10-CM | POA: Diagnosis not present

## 2014-07-14 DIAGNOSIS — H35033 Hypertensive retinopathy, bilateral: Secondary | ICD-10-CM | POA: Diagnosis not present

## 2014-07-14 DIAGNOSIS — Z961 Presence of intraocular lens: Secondary | ICD-10-CM | POA: Diagnosis not present

## 2014-07-14 NOTE — Telephone Encounter (Signed)
noted 

## 2014-08-23 ENCOUNTER — Ambulatory Visit: Payer: Medicare Other | Admitting: Diagnostic Neuroimaging

## 2014-08-31 ENCOUNTER — Telehealth: Payer: Self-pay | Admitting: *Deleted

## 2014-08-31 ENCOUNTER — Encounter: Payer: Self-pay | Admitting: Internal Medicine

## 2014-08-31 ENCOUNTER — Telehealth: Payer: Self-pay | Admitting: Diagnostic Neuroimaging

## 2014-08-31 NOTE — Telephone Encounter (Signed)
Spoke with the husband per Dr. Gladstone Lighter request and informed him that he should speak with her PCP and get some blood work and a urine test to rule out any metabolic or infectious reasons causing her hallucinations and increase in falls. He stated that he would call her PCP and thanked me

## 2014-08-31 NOTE — Telephone Encounter (Signed)
Patient's spouse stated Hallucinations has worsened and she's falling a lot.  Please call and advise.

## 2014-08-31 NOTE — Telephone Encounter (Signed)
Advise patient/family to check in with PCP to rule out infection or metabolic causes. -VRP

## 2014-09-13 ENCOUNTER — Ambulatory Visit (INDEPENDENT_AMBULATORY_CARE_PROVIDER_SITE_OTHER): Payer: Medicare Other | Admitting: Internal Medicine

## 2014-09-13 ENCOUNTER — Other Ambulatory Visit (INDEPENDENT_AMBULATORY_CARE_PROVIDER_SITE_OTHER): Payer: Medicare Other

## 2014-09-13 ENCOUNTER — Encounter: Payer: Self-pay | Admitting: Internal Medicine

## 2014-09-13 VITALS — BP 124/70 | HR 96 | Temp 98.5°F | Resp 16 | Ht 60.0 in | Wt 94.5 lb

## 2014-09-13 DIAGNOSIS — E118 Type 2 diabetes mellitus with unspecified complications: Secondary | ICD-10-CM

## 2014-09-13 DIAGNOSIS — I10 Essential (primary) hypertension: Secondary | ICD-10-CM

## 2014-09-13 DIAGNOSIS — E7800 Pure hypercholesterolemia, unspecified: Secondary | ICD-10-CM

## 2014-09-13 DIAGNOSIS — Z0000 Encounter for general adult medical examination without abnormal findings: Secondary | ICD-10-CM

## 2014-09-13 DIAGNOSIS — E038 Other specified hypothyroidism: Secondary | ICD-10-CM | POA: Diagnosis not present

## 2014-09-13 DIAGNOSIS — Z23 Encounter for immunization: Secondary | ICD-10-CM | POA: Diagnosis not present

## 2014-09-13 DIAGNOSIS — E559 Vitamin D deficiency, unspecified: Secondary | ICD-10-CM

## 2014-09-13 DIAGNOSIS — F0390 Unspecified dementia without behavioral disturbance: Secondary | ICD-10-CM

## 2014-09-13 DIAGNOSIS — F03A0 Unspecified dementia, mild, without behavioral disturbance, psychotic disturbance, mood disturbance, and anxiety: Secondary | ICD-10-CM

## 2014-09-13 LAB — CBC WITH DIFFERENTIAL/PLATELET
BASOS ABS: 0 10*3/uL (ref 0.0–0.1)
Basophils Relative: 0.7 % (ref 0.0–3.0)
EOS PCT: 0.8 % (ref 0.0–5.0)
Eosinophils Absolute: 0 10*3/uL (ref 0.0–0.7)
HCT: 40.7 % (ref 36.0–46.0)
Hemoglobin: 13.5 g/dL (ref 12.0–15.0)
Lymphocytes Relative: 31.9 % (ref 12.0–46.0)
Lymphs Abs: 1.4 10*3/uL (ref 0.7–4.0)
MCHC: 33.2 g/dL (ref 30.0–36.0)
MCV: 93 fl (ref 78.0–100.0)
Monocytes Absolute: 0.2 10*3/uL (ref 0.1–1.0)
Monocytes Relative: 4.2 % (ref 3.0–12.0)
NEUTROS PCT: 62.4 % (ref 43.0–77.0)
Neutro Abs: 2.8 10*3/uL (ref 1.4–7.7)
Platelets: 209 10*3/uL (ref 150.0–400.0)
RBC: 4.38 Mil/uL (ref 3.87–5.11)
RDW: 13.9 % (ref 11.5–15.5)
WBC: 4.5 10*3/uL (ref 4.0–10.5)

## 2014-09-13 LAB — COMPREHENSIVE METABOLIC PANEL
ALK PHOS: 71 U/L (ref 39–117)
ALT: 6 U/L (ref 0–35)
AST: 20 U/L (ref 0–37)
Albumin: 4.3 g/dL (ref 3.5–5.2)
BUN: 16 mg/dL (ref 6–23)
CO2: 33 meq/L — AB (ref 19–32)
Calcium: 9.6 mg/dL (ref 8.4–10.5)
Chloride: 106 mEq/L (ref 96–112)
Creatinine, Ser: 0.88 mg/dL (ref 0.40–1.20)
GFR: 80.37 mL/min (ref >60.00)
GLUCOSE: 93 mg/dL (ref 70–99)
Potassium: 4 mEq/L (ref 3.5–5.1)
SODIUM: 142 meq/L (ref 135–145)
Total Bilirubin: 0.6 mg/dL (ref 0.2–1.2)
Total Protein: 6.9 g/dL (ref 6.0–8.3)

## 2014-09-13 LAB — LIPID PANEL
CHOL/HDL RATIO: 3
CHOLESTEROL: 176 mg/dL (ref 0–200)
HDL: 61.7 mg/dL (ref >39.00)
LDL Cholesterol: 92 mg/dL (ref 0–99)
NonHDL: 114.3
Triglycerides: 111 mg/dL (ref 0.0–149.0)
VLDL: 22.2 mg/dL (ref 0.0–40.0)

## 2014-09-13 LAB — HEMOGLOBIN A1C: HEMOGLOBIN A1C: 5.4 % (ref 4.6–6.5)

## 2014-09-13 LAB — TSH: TSH: 3.88 u[IU]/mL (ref 0.35–4.50)

## 2014-09-13 LAB — MICROALBUMIN / CREATININE URINE RATIO
Creatinine,U: 31.4 mg/dL
MICROALB/CREAT RATIO: 2.2 mg/g (ref 0.0–30.0)

## 2014-09-13 NOTE — Patient Instructions (Signed)
Hypothyroidism The thyroid is a large gland located in the lower front of your neck. The thyroid gland helps control metabolism. Metabolism is how your body handles food. It controls metabolism with the hormone thyroxine. When this gland is underactive (hypothyroid), it produces too little hormone.  CAUSES These include:   Absence or destruction of thyroid tissue.  Goiter due to iodine deficiency.  Goiter due to medications.  Congenital defects (since birth).  Problems with the pituitary. This causes a lack of TSH (thyroid stimulating hormone). This hormone tells the thyroid to turn out more hormone. SYMPTOMS  Lethargy (feeling as though you have no energy)  Cold intolerance  Weight gain (in spite of normal food intake)  Dry skin  Coarse hair  Menstrual irregularity (if severe, may lead to infertility)  Slowing of thought processes Cardiac problems are also caused by insufficient amounts of thyroid hormone. Hypothyroidism in the newborn is cretinism, and is an extreme form. It is important that this form be treated adequately and immediately or it will lead rapidly to retarded physical and mental development. DIAGNOSIS  To prove hypothyroidism, your caregiver may do blood tests and ultrasound tests. Sometimes the signs are hidden. It may be necessary for your caregiver to watch this illness with blood tests either before or after diagnosis and treatment. TREATMENT  Low levels of thyroid hormone are increased by using synthetic thyroid hormone. This is a safe, effective treatment. It usually takes about four weeks to gain the full effects of the medication. After you have the full effect of the medication, it will generally take another four weeks for problems to leave. Your caregiver may start you on low doses. If you have had heart problems the dose may be gradually increased. It is generally not an emergency to get rapidly to normal. HOME CARE INSTRUCTIONS   Take your  medications as your caregiver suggests. Let your caregiver know of any medications you are taking or start taking. Your caregiver will help you with dosage schedules.  As your condition improves, your dosage needs may increase. It will be necessary to have continuing blood tests as suggested by your caregiver.  Report all suspected medication side effects to your caregiver. SEEK MEDICAL CARE IF: Seek medical care if you develop:  Sweating.  Tremulousness (tremors).  Anxiety.  Rapid weight loss.  Heat intolerance.  Emotional swings.  Diarrhea.  Weakness. SEEK IMMEDIATE MEDICAL CARE IF:  You develop chest pain, an irregular heart beat (palpitations), or a rapid heart beat. MAKE SURE YOU:   Understand these instructions.  Will watch your condition.  Will get help right away if you are not doing well or get worse. Document Released: 06/11/2005 Document Revised: 09/03/2011 Document Reviewed: 01/30/2008 Exit[employer] Patient Information 2015 [employer]. This information is not intended to replace advice given to you by your health care provider. Make sure you discuss any questions you have with your health care provider.  

## 2014-09-13 NOTE — Progress Notes (Signed)
Subjective:    Patient ID: [name], female    DOB: 1941-02-10, 74 y.o.   MRN: [id number]  Thyroid Problem Presents for follow-up visit. Symptoms include fatigue, tremors and weight loss. Patient reports no anxiety, cold intolerance, constipation, depressed mood, diaphoresis, diarrhea, dry skin, hair loss, heat intolerance, hoarse voice, leg swelling, nail problem, palpitations, visual change or weight gain. The symptoms have been stable. Past treatments include levothyroxine. The treatment provided mild relief.      Review of Systems  Constitutional: Positive for weight loss, fatigue and unexpected weight change. Negative for fever, chills, weight gain, diaphoresis and appetite change.  HENT: Negative.  Negative for hoarse voice, trouble swallowing and voice change.   Eyes: Negative.   Respiratory: Negative.  Negative for cough, choking, chest tightness, shortness of breath and stridor.   Cardiovascular: Negative.  Negative for chest pain, palpitations and leg swelling.  Gastrointestinal: Negative.  Negative for nausea, vomiting, abdominal pain, diarrhea and constipation.  Endocrine: Negative.  Negative for cold intolerance and heat intolerance.  Genitourinary: Negative.   Musculoskeletal: Negative.  Negative for myalgias, back pain, joint swelling, arthralgias, gait problem, neck pain and neck stiffness.  Skin: Negative.  Negative for rash.  Allergic/Immunologic: Negative.   Neurological: Positive for tremors.  Hematological: Negative.  Negative for adenopathy. Does not bruise/bleed easily.  Psychiatric/Behavioral: Positive for confusion and decreased concentration. Negative for suicidal ideas, hallucinations, behavioral problems, sleep disturbance, self-injury, dysphoric mood and agitation. The patient is not nervous/anxious and is not hyperactive.        Objective:   Physical Exam  Constitutional: She is oriented to person, place, and time. She appears well-developed and  well-nourished. No distress.  HENT:  Head: Normocephalic and atraumatic.  Mouth/Throat: Oropharynx is clear and moist. No oropharyngeal exudate.  Eyes: Conjunctivae are normal. Right eye exhibits no discharge. Left eye exhibits no discharge. No scleral icterus.  Neck: Normal range of motion. Neck supple. No JVD present. No tracheal deviation present. No thyromegaly present.  Cardiovascular: Normal rate, regular rhythm, normal heart sounds and intact distal pulses.  Exam reveals no gallop and no friction rub.   No murmur heard. Pulmonary/Chest: Effort normal and breath sounds normal. No stridor. No respiratory distress. She has no wheezes. She has no rales. She exhibits no tenderness.  Abdominal: Soft. Bowel sounds are normal. She exhibits no distension and no mass. There is no tenderness. There is no rebound and no guarding.  Musculoskeletal: Normal range of motion. She exhibits no edema or tenderness.  Lymphadenopathy:    She has no cervical adenopathy.  Neurological: She is oriented to person, place, and time.  Skin: Skin is warm and dry. No rash noted. She is not diaphoretic. No erythema. No pallor.  Psychiatric: She has a normal mood and affect. Judgment and thought content normal. Her mood appears not anxious. Her speech is delayed. Her speech is not tangential. She is slowed and withdrawn. She is not agitated, not aggressive, not hyperactive, not actively hallucinating and not combative. Cognition and memory are normal. She does not exhibit a depressed mood. She is inattentive.  Vitals reviewed.    Lab Results  Component Value Date   WBC 4.4 03/05/2014   HGB 11.6* 03/05/2014   HCT 36.7 03/05/2014   PLT 199 03/05/2014   GLUCOSE 99 03/18/2014   CHOL 166 09/01/2013   TRIG 26.0 09/01/2013   HDL 64.50 09/01/2013   LDLDIRECT 142.2 10/28/2008   LDLCALC 96 09/01/2013   ALT <5 03/05/2014   AST 19  03/05/2014   NA 142 03/18/2014   K 4.3 03/18/2014   CL 105 03/18/2014   CREATININE 0.8  03/18/2014   BUN 14 03/18/2014   CO2 31 03/18/2014   TSH 8.16* 03/18/2014   INR 1.10 05/19/2012   HGBA1C 6.0 09/01/2013       Assessment & Plan:

## 2014-09-13 NOTE — Progress Notes (Signed)
Pre visit review using our clinic review tool, if applicable. No additional management support is needed unless otherwise documented below in the visit note. 

## 2014-09-14 ENCOUNTER — Encounter: Payer: Self-pay | Admitting: Internal Medicine

## 2014-09-14 DIAGNOSIS — Z0000 Encounter for general adult medical examination without abnormal findings: Secondary | ICD-10-CM | POA: Insufficient documentation

## 2014-09-14 NOTE — Assessment & Plan Note (Signed)

## 2014-09-14 NOTE — Assessment & Plan Note (Signed)
LDL-C is 92 She is not willing to take a statin

## 2014-09-14 NOTE — Assessment & Plan Note (Signed)
Her TSh is in the normal range Will cont the current dose

## 2014-09-14 NOTE — Assessment & Plan Note (Signed)
Her BP is well controlled Lytes and renal function are stable 

## 2014-09-14 NOTE — Assessment & Plan Note (Signed)
Blood sugars are well controlled.

## 2014-09-14 NOTE — Assessment & Plan Note (Signed)
This is treated by neuology

## 2014-09-21 ENCOUNTER — Ambulatory Visit: Payer: Medicare Other | Admitting: Hematology and Oncology

## 2014-09-26 ENCOUNTER — Other Ambulatory Visit: Payer: Self-pay | Admitting: Internal Medicine

## 2014-10-03 ENCOUNTER — Other Ambulatory Visit: Payer: Self-pay | Admitting: Internal Medicine

## 2014-10-12 ENCOUNTER — Telehealth: Payer: Self-pay | Admitting: Hematology and Oncology

## 2014-10-12 ENCOUNTER — Ambulatory Visit (HOSPITAL_BASED_OUTPATIENT_CLINIC_OR_DEPARTMENT_OTHER): Payer: Medicare Other | Admitting: Hematology and Oncology

## 2014-10-12 VITALS — BP 121/61 | HR 57 | Temp 98.3°F | Resp 18 | Ht 60.0 in | Wt 88.8 lb

## 2014-10-12 DIAGNOSIS — C50912 Malignant neoplasm of unspecified site of left female breast: Secondary | ICD-10-CM

## 2014-10-12 DIAGNOSIS — Z853 Personal history of malignant neoplasm of breast: Secondary | ICD-10-CM

## 2014-10-12 NOTE — Progress Notes (Signed)
Patient Care Team: Janith Lima, MD as PCP - General (Internal Medicine) Latanya Maudlin, MD as Attending Physician (Orthopedic Surgery)  DIAGNOSIS: No matching staging information was found for the patient.  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 She had declined tamoxifen therapy. She reports to be doing okay. She has fallen this morning and has bruised the right side of the eye.  REVIEW OF SYSTEMS:   Constitutional: Denies fevers, chills or abnormal weight loss Eyes: Denies blurriness of vision Ears, nose, mouth, throat, and face: Denies mucositis or sore throat Respiratory: Denies cough, dyspnea or wheezes Cardiovascular: Denies palpitation, chest discomfort or lower extremity swelling Gastrointestinal:  Denies nausea, heartburn or change in bowel habits Skin: Denies abnormal skin rashes Lymphatics: Denies new lymphadenopathy or easy bruising Neurological:Denies numbness, tingling or new weaknesses Behavioral/Psych: Mood is stable, no new changes  Breast:  denies any pain or lumps or nodules in either breasts All other systems were reviewed with the patient and are negative.  I have reviewed the past medical history, past surgical history, social history and family history with the patient and they are unchanged from previous note.  ALLERGIES:  has No Known Allergies.  MEDICATIONS:  Current Outpatient Prescriptions  Medication Sig Dispense Refill  . acetaminophen-codeine (TYLENOL #3) 300-30 MG per tablet Take 1  tablet by mouth every 6 (six) hours as needed. for pain  0  . aspirin 81 MG tablet Take 81 mg by mouth daily.    . carbidopa-levodopa-entacapone (STALEVO) 25-100-200 MG per tablet TAKE 1 TABLET BY MOUTH 4 (FOUR) TIMES DAILY. 120 tablet 12  . clopidogrel (PLAVIX) 75 MG tablet TAKE 1 TABLET (75 MG TOTAL) BY MOUTH DAILY. 60 tablet 5  . [employer] VITAMIN D 2000 UNITS CAPS TAKE 1 CAPSULE (2,000 UNITS TOTAL) BY MOUTH DAILY. 90 capsule 3  . levothyroxine (SYNTHROID, LEVOTHROID) 75 MCG tablet Take 1 tablet by mouth daily.  5  . levothyroxine (SYNTHROID, LEVOTHROID) 88 MCG tablet TAKE 1 TABLET BY MOUTH EVERY DAY 90 tablet 1  . lisinopril (PRINIVIL,ZESTRIL) 10 MG tablet Take 10 mg by mouth daily.    Marland Kitchen torsemide (DEMADEX) 10 MG tablet TAKE 1 TABLET (10 MG TOTAL) BY MOUTH DAILY. 90 tablet 3  . torsemide (DEMADEX) 10 MG tablet TAKE 1 TABLET (10 MG TOTAL) BY MOUTH DAILY. 90 tablet 1  . traMADol (ULTRAM) 50 MG tablet Take 1 tablet (50 mg total) by mouth every 6 (six) hours as needed. 15 tablet 0   No current facility-administered medications for this visit.    PHYSICAL EXAMINATION: ECOG PERFORMANCE STATUS: 1 - Symptomatic but completely ambulatory  Filed Vitals:   10/12/14 1011  BP: 121/61  Pulse: 57  Temp: 98.3 F (36.8 C)  Resp: 18   Filed Weights   10/12/14 1011  Weight: 88 lb 12.8 oz (40.279 kg)    GENERAL:alert, no distress and comfortable SKIN: skin color, texture, turgor are normal, no rashes or significant lesions EYES: normal, Conjunctiva are pink and non-injected, sclera clear OROPHARYNX:no exudate, no erythema and lips, buccal mucosa, and tongue normal  NECK:  supple, thyroid normal size, non-tender, without nodularity LYMPH:  no palpable lymphadenopathy in the cervical, axillary or inguinal LUNGS: clear to auscultation and percussion with normal breathing effort HEART: regular rate & rhythm and no murmurs and no lower extremity edema ABDOMEN:abdomen soft, non-tender and normal bowel  sounds Musculoskeletal:no cyanosis of digits and no clubbing  NEURO: alert & oriented x 3 with fluent speech, no focal motor/sensory deficits BREAST: No palpable masses or nodules in either right or left breasts. No palpable axillary supraclavicular or infraclavicular adenopathy no breast tenderness or nipple discharge. (exam performed in the presence of a chaperone)  LABORATORY DATA:  I have reviewed the data as listed   Chemistry      Component Value Date/Time   NA 142 09/13/2014 1413   NA 145 02/25/2014 0815   NA 145* 12/11/2013 0942   K 4.0 09/13/2014 1413   K 4.0 02/25/2014 0815   CL 106 09/13/2014 1413   CL 107 12/08/2012 0926   CO2 33* 09/13/2014 1413   CO2 27 02/25/2014 0815   BUN 16 09/13/2014 1413   BUN 15.1 02/25/2014 0815   BUN 14 12/11/2013 0942   CREATININE 0.88 09/13/2014 1413   CREATININE 0.8 02/25/2014 0815      Component Value Date/Time   CALCIUM 9.6 09/13/2014 1413   CALCIUM 8.8 02/25/2014 0815   ALKPHOS 71 09/13/2014 1413   ALKPHOS 64 02/25/2014 0815   AST 20 09/13/2014 1413   AST 16 02/25/2014 0815   ALT 6 09/13/2014 1413   ALT 6 02/25/2014 0815   BILITOT 0.6 09/13/2014 1413   BILITOT 0.97 02/25/2014 0815       Lab Results  Component Value Date   WBC 4.5 09/13/2014   HGB 13.5 09/13/2014   HCT 40.7 09/13/2014   MCV 93.0 09/13/2014   PLT 209.0 09/13/2014   NEUTROABS 2.8 09/13/2014    ASSESSMENT & PLAN:  Breast cancer, 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 Breast exam 10/12/2014 is normal 2. Mammogram and ultrasound 07/08/2014 revealed bilateral cysts 5 mm and left 1.5 cm on right  Gait issues and frequent falls: Patient's family is very much aware of this and they're trying their best to prevent falls and working with her primary care physician.  Return to clinic once a year for follow-ups with survivorship NP    No  orders of the defined types were placed in this encounter.   The patient has a good understanding of the overall plan. she agrees with it. She will call with any problems that may develop before her next visit here.   Rulon Eisenmenger, MD   He has just heard about that yet no we will do I know I was going to talk to her about chemotherapy we will just

## 2014-10-12 NOTE — Telephone Encounter (Signed)
inbox to gretchen to add to one year follow up list  [name]

## 2014-10-12 NOTE — Assessment & Plan Note (Signed)
DCIS left breast status post lumpectomy 01/25/2006 and radiation completed 04/21/2006 refused tamoxifen therapy:  Breast cancer surveillance: 1. Breast exam 10/12/2014 is normal 2. Mammogram and ultrasound 07/08/2014 revealed bilateral cysts 5 mm and left 1.5 cm on right  Gait issues and frequent falls: Patient's family is very much aware of this and they're trying their best to prevent falls and working with her primary care physician.  Return to clinic once a year for follow-ups

## 2014-10-13 DIAGNOSIS — H4011X1 Primary open-angle glaucoma, mild stage: Secondary | ICD-10-CM | POA: Diagnosis not present

## 2014-10-13 DIAGNOSIS — H409 Unspecified glaucoma: Secondary | ICD-10-CM | POA: Diagnosis not present

## 2014-11-16 ENCOUNTER — Encounter: Payer: Self-pay | Admitting: Diagnostic Neuroimaging

## 2014-11-16 ENCOUNTER — Ambulatory Visit (INDEPENDENT_AMBULATORY_CARE_PROVIDER_SITE_OTHER): Payer: Medicare Other | Admitting: Diagnostic Neuroimaging

## 2014-11-16 VITALS — BP 106/68 | HR 61 | Ht 59.0 in | Wt 90.6 lb

## 2014-11-16 DIAGNOSIS — G20 Parkinson's disease: Secondary | ICD-10-CM

## 2014-11-16 DIAGNOSIS — F0280 Dementia in other diseases classified elsewhere without behavioral disturbance: Secondary | ICD-10-CM | POA: Diagnosis not present

## 2014-11-16 MED ORDER — CARBIDOPA-LEVODOPA-ENTACAPONE 25-100-200 MG PO TABS: ORAL_TABLET | ORAL | Status: DC

## 2014-11-16 NOTE — Progress Notes (Signed)
[facility]  PATIENT: [name] DOB: 1940/10/23  REFERRING CLINICIAN:  HISTORY FROM: patient and husband REASON FOR VISIT: follow up   HISTORICAL  CHIEF COMPLAINT:  Chief Complaint  Patient presents with  . Follow-up    Parkinsons disease     HISTORY OF PRESENT ILLNESS:   UPDATE 11/16/14: Since last visit, still falling, more memory problems. Tolerating meds. Sitter comes to home 3 hrs per week. Pt hs 24 hr supervision (mainly by husband). He has looked into adult daycare center nearby their home.  UPDATE 05/18/14: Since last visit, many more falls, usually when patient is alone and tries to walk down steps or take a shower unsupervised. Husband has sitter now when he needs to go out. Not much benefit with azilect. Tremor under control. Some head nodding behavior.   UPDATE 02/22/14: Since last visit, more falls. No warning. Has occurred with getting out of bed, bending over, making the bed. Husband hears a "thud" in the other room and finds her on the floor, no confusion or LOC. Hallucinations stable. No wearing off except in early AM before 1st dose stalevo.   UPDATE 12/11/13: Since last visit, was stable until 3 weeks ago. Now with more confusion, intermittent, sometimes not recognizing her family. Sometimes she [place] is [place] the bus [place], even though she is standing in her home. No fevers, chills, cough, diarrhea, pain, SOB, CP, N, V. Non threatening visual hallucinations are stable. Memory worsening. Could not tolerate exelon patch (slightly paranoid about its effects).   UPDATE 10/30/12: Since last visit patient is stable. Reduced dosing of Stalevo seems to have improved her hallucinations, but her handwriting has worsened. She still sees children and people in her home, but according to the husband this is much less frequent. He has had to adjust some of the objects in his home which were bothering her. For example plants or water heaters high up in  the room, or radiators on the floor, seem to bother her more. Once he removed these from her view she felt better. Tremor, gait and balance are stable. No falls. She does have more stooped posture than before but this seems to be stable since her back surgery. She is having some or drooling. She's having more runny nose. She keeps a napkin with her to wipe her nose. Patient's husband reports more short-term memory loss than before.  UPDATE 09/05/12: Since last visit, has reduced stalevo to QID. no change in hallucinations or parkinson's symptoms. until 1 month ago, some progression of confusion, memory, hallucinations and paranoia. this fluctuates throughout the day.  UPDATE 07/07/12: Since last visit, tried seroquel x 2 weeks, but hallucinations worsened. Now off seroquel. Also had low back surgery (nov 2013), and went to rehab. Now back home. Stalevo dosing stable.  UPDATE 04/16/12: Never picked up seroquel after last visit. she didn't know that i rx'd this, even though we discussed it. will try rx again. hallucinations are stable. some more stiffness in hips lately. on 5 tabs stalevo per day.  UPDATE 08/14/11:  Continues to have hallucinations, mostly at night, denies being afraid but feels they are bothersome.  Has not thought about calling 911.  Realizes they are hallucinations.  Took Stalevo 4 times per day for one week without improvement of hallucinations, no change in parkinson's symptoms nowback up to 5 times a day.  She is interested in trying other medications for relief.  Also verbalizes concerns of choking episodes in her sleep.  Occurs approximately 3-4 times per month.  Denies choking episodes with eating or drinking.  PRIOR HPI: 74 year old female with history of hypothyroidism, hypertension, peripheral vascular disease presenting for evaluation and management of Parkinson's disease.  She is accompanied by her husband. In 2004 patient began to develop tremor in her bilateral upper  extremities. She was diagnosed with Parkinson's disease. She was initially treated with Mirapex and Sinemet by Dr. Jacolyn Reedy ([place]).  She began to develop hallucinations and was ultimately changed to Stalevo by Dr. Marzetta [facility]).  She has done fairly well on this medication. She takes Stalevo 37.5/150/200, 1 tab five times a day (7am, 11am, 3pm, 7pm, bedtime).  She notices wearing off around 4 hours after her dose. She has not had any falls. She denies hallucinations or constipation.  She would like to re-establish care with a local neurologist.   REVIEW OF SYSTEMS: Full 14 system review of systems performed and notable only for memory loss hallucinations confusion gait diff speech diff.   ALLERGIES: No Known Allergies  HOME MEDICATIONS: Outpatient Prescriptions Prior to Visit  Medication Sig Dispense Refill  . aspirin 81 MG tablet Take 81 mg by mouth daily.    . carbidopa-levodopa-entacapone (STALEVO) 25-100-200 MG per tablet TAKE 1 TABLET BY MOUTH 4 (FOUR) TIMES DAILY. 120 tablet 12  . clopidogrel (PLAVIX) 75 MG tablet TAKE 1 TABLET (75 MG TOTAL) BY MOUTH DAILY. 60 tablet 5  . levothyroxine (SYNTHROID, LEVOTHROID) 75 MCG tablet Take 1 tablet by mouth daily.  5  . lisinopril (PRINIVIL,ZESTRIL) 10 MG tablet Take 10 mg by mouth daily.    Marland Kitchen levothyroxine (SYNTHROID, LEVOTHROID) 88 MCG tablet TAKE 1 TABLET BY MOUTH EVERY DAY 90 tablet 1  . acetaminophen-codeine (TYLENOL #3) 300-30 MG per tablet Take 1 tablet by mouth every 6 (six) hours as needed. for pain  0  . [employer] VITAMIN D 2000 UNITS CAPS TAKE 1 CAPSULE (2,000 UNITS TOTAL) BY MOUTH DAILY. 90 capsule 3  . torsemide (DEMADEX) 10 MG tablet TAKE 1 TABLET (10 MG TOTAL) BY MOUTH DAILY. 90 tablet 3  . torsemide (DEMADEX) 10 MG tablet TAKE 1 TABLET (10 MG TOTAL) BY MOUTH DAILY. 90 tablet 1  . traMADol (ULTRAM) 50 MG tablet Take 1 tablet (50 mg total) by mouth every 6 (six) hours as needed. 15 tablet 0   No facility-administered medications prior  to visit.    PAST MEDICAL HISTORY: Past Medical History  Diagnosis Date  . Glaucoma   . Abnormal chest x-ray   . Hypertension   . Peripheral vascular disease   . Venous insufficiency   . Hypercholesteremia   . Hypothyroid   . Diverticulosis of colon   . DJD (degenerative joint disease)   . Hip pain   . Lumbar back pain   . Osteopenia   . Vitamin D deficiency   . Parkinson disease   . Anxiety   . Breast cancer     stage 0 left    PAST SURGICAL HISTORY: Past Surgical History  Procedure Laterality Date  . Ovarian cyst removal    . Lapraoscopic cholecystectomy  09/2003    Dr. Dalbert Batman  . Lumbar laminectomy and foraminotomies for spinal stenosis  11/2005    Dr. Gladstone Lighter  . Cataract surgery and argon laser trabeculoplasty  04/2010    Dr. Herbert Deaner  . Colonoscopy    . Polypectomy    . Cholecystectomy    . Breast lumpectomy  8/11 by Dr[place]akefield  . Incise and drain abcess  10/04/11  sebaceous cyst on back  . Lumbar laminectomy/decompression microdiscectomy  05/20/2012    Procedure: LUMBAR LAMINECTOMY/DECOMPRESSION MICRODISCECTOMY 2 LEVELS;  Surgeon: Tobi Bastos, MD;  Location: [facility];  Service: Orthopedics;  Laterality: N/A;    FAMILY HISTORY: Family History  Problem Relation Age of Onset  . Colon cancer Father     SOCIAL HISTORY:  History   Social History  . Marital Status: Married    Spouse Name: [name]  . Number of Children: 2  . Years of Education: Assoc   Occupational History  . retired   .     Social History Main Topics  . Smoking status: Never Smoker   . Smokeless tobacco: Never Used  . Alcohol Use: No  . Drug Use: No  . Sexual Activity: Not Currently   Other Topics Concern  . Not on file   Social History Narrative   Pt lives at home with spouse.   Caffeine Use: Very little; once a week     PHYSICAL EXAM  Filed Vitals:   11/16/14 1001  BP: 106/68  Pulse: 61  Height: 4\' 11"  (1.499 m)  Weight: 90 lb 9.6 oz (41.096 kg)   Body mass  index is 18.29 kg/(m^2).  EXAM: General: Patient is awake, alert and in no acute distress.  Well developed and groomed. Neck is supple. Cardiovascular: No carotid artery bruits.  Heart is regular rate and rhythm with no murmurs.  Neurologic Exam  Mental Status: Awake, alert.  Language has decr fluency. Comprehension intact. POSITIVE SNOUT AND MYERSONS REFLEXES Cranial Nerves: Pupils are POST-SURGICAL. Visual fields are full to confrontation.  Conjugate eye movements are full and symmetric.  Facial sensation and strength are symmetric.  Hearing is intact.  Palate elevated symmetrically and uvula is midline.  Shoulder shug is symmetric.  Tongue is midline.  MASKED FACIES.  POSITIVE MYERSON'S SIGN. Motor: Normal bulk and MOD COGWHEELING IN BUE. NO TREMOR. BRADYKINESIA IN BUE AND BLE (LEFT WORSE THAN RIGHT). Full strength in the upper and lower extremities.  No pronator drift. SOME HEAD NODDING MOVEMENTS WITH RAM OF BUE. Sensory: Intact and symmetric to light touch. Coordination: No ataxia or dysmetria on finger-nose or rapid alternating movement testing.  SLOW RAM. Reflexes: Deep tendon reflexes in the upper and lower extremity are TRACE and symmetric. Gait and Station: Narrow based gait; STOOPED POSTURE. SLOW SHORT STEPS. USES SINGLE POINT CANE.      DIAGNOSTIC DATA (LABS, IMAGING, TESTING) - I reviewed patient records, labs, notes, testing and imaging myself where available.  Lab Results  Component Value Date   WBC 4.5 09/13/2014   HGB 13.5 09/13/2014   HCT 40.7 09/13/2014   MCV 93.0 09/13/2014   PLT 209.0 09/13/2014      Component Value Date/Time   NA 142 09/13/2014 1413   NA 145 02/25/2014 0815   NA 145* 12/11/2013 0942   K 4.0 09/13/2014 1413   K 4.0 02/25/2014 0815   CL 106 09/13/2014 1413   CL 107 12/08/2012 0926   CO2 33* 09/13/2014 1413   CO2 27 02/25/2014 0815   GLUCOSE 93 09/13/2014 1413   GLUCOSE 110 02/25/2014 0815   GLUCOSE 104* 12/11/2013 0942   GLUCOSE 123*  12/08/2012 0926   BUN 16 09/13/2014 1413   BUN 15.1 02/25/2014 0815   BUN 14 12/11/2013 0942   CREATININE 0.88 09/13/2014 1413   CREATININE 0.8 02/25/2014 0815   CALCIUM 9.6 09/13/2014 1413   CALCIUM 8.8 02/25/2014 0815   PROT 6.9 09/13/2014 1413  PROT 6.1* 02/25/2014 0815   PROT 6.7 12/11/2013 0942   ALBUMIN 4.3 09/13/2014 1413   ALBUMIN 3.6 02/25/2014 0815   AST 20 09/13/2014 1413   AST 16 02/25/2014 0815   ALT 6 09/13/2014 1413   ALT 6 02/25/2014 0815   ALKPHOS 71 09/13/2014 1413   ALKPHOS 64 02/25/2014 0815   BILITOT 0.6 09/13/2014 1413   BILITOT 0.97 02/25/2014 0815   GFRNONAA 82* 03/05/2014 1938   GFRAA >90 03/05/2014 1938   Lab Results  Component Value Date   CHOL 176 09/13/2014   HDL 61.70 09/13/2014   LDLCALC 92 09/13/2014   LDLDIRECT 142.2 10/28/2008   TRIG 111.0 09/13/2014   CHOLHDL 3 09/13/2014   Lab Results  Component Value Date   HGBA1C 5.4 09/13/2014   No results found for: [id number] Lab Results  Component Value Date   TSH 3.88 09/13/2014    09/09/12 MRI brain - Mild perisylvian and moderate temporal atrophy. Moderate periventricular and subcortical chronic small vessel ischemic disease. No significant change from prior MRI on 05/08/11.   ASSESSMENT AND PLAN  74 y.o. female with history of hypothyroidism, hypertension, peripheral vascular disease presenting for evaluation and management of Parkinson's disease. She takes Stalevo 25/100/200, 1 tab 4 times a day. Non-threatening visual hallucinations are stable. More falls and memory problems. lately. Unfortunately she did not benefit from PT.  PLAN: 1. Continue stalevo 25/100/200 4 tabs per day  2. Palliative care consult for advanced care planning  Orders Placed This Encounter  Procedures  . Amb Referral to Palliative Care   Return in about 1 year (around 11/16/2015).     Penni Bombard, MD 2/80/0349, 17:91 AM Certified in Neurology, Neurophysiology and Neuroimaging  [facility] [address] [place], [place] [address] [phone]

## 2014-11-16 NOTE — Patient Instructions (Signed)
Continue stalevo.  I will setup palliative care consult.

## 2014-11-17 ENCOUNTER — Telehealth: Payer: Self-pay

## 2014-11-17 NOTE — Telephone Encounter (Signed)
Yes ok 

## 2014-11-17 NOTE — Telephone Encounter (Signed)
Palliative care will be two weeks  before patient can be evaluated for Palliative Care Consult for advanced home care planning. Will it be ok to wait to weeks ?

## 2014-11-18 NOTE — Telephone Encounter (Signed)
All information has been sent. [facility].

## 2014-12-02 DIAGNOSIS — G20 Parkinson's disease: Secondary | ICD-10-CM | POA: Diagnosis not present

## 2015-01-13 ENCOUNTER — Encounter: Payer: Self-pay | Admitting: Internal Medicine

## 2015-01-13 ENCOUNTER — Ambulatory Visit (INDEPENDENT_AMBULATORY_CARE_PROVIDER_SITE_OTHER): Payer: Medicare Other | Admitting: Internal Medicine

## 2015-01-13 ENCOUNTER — Other Ambulatory Visit (INDEPENDENT_AMBULATORY_CARE_PROVIDER_SITE_OTHER): Payer: Medicare Other

## 2015-01-13 VITALS — BP 116/64 | HR 58 | Temp 98.4°F | Resp 16 | Ht 59.0 in | Wt 86.2 lb

## 2015-01-13 DIAGNOSIS — I10 Essential (primary) hypertension: Secondary | ICD-10-CM

## 2015-01-13 DIAGNOSIS — E038 Other specified hypothyroidism: Secondary | ICD-10-CM

## 2015-01-13 DIAGNOSIS — E876 Hypokalemia: Secondary | ICD-10-CM

## 2015-01-13 LAB — BASIC METABOLIC PANEL
BUN: 19 mg/dL (ref 6–23)
CO2: 37 mEq/L — ABNORMAL HIGH (ref 19–32)
Calcium: 9.5 mg/dL (ref 8.4–10.5)
Chloride: 106 mEq/L (ref 96–112)
Creatinine, Ser: 0.93 mg/dL (ref 0.40–1.20)
GFR: 75.33 mL/min (ref >60.00)
Glucose, Bld: 88 mg/dL (ref 70–99)
Potassium: 3.3 mEq/L — ABNORMAL LOW (ref 3.5–5.1)
Sodium: 147 mEq/L — ABNORMAL HIGH (ref 135–145)

## 2015-01-13 LAB — TSH: TSH: 0.57 u[IU]/mL (ref 0.35–4.50)

## 2015-01-13 MED ORDER — POTASSIUM CHLORIDE CRYS ER 20 MEQ PO TBCR: 20.0000 meq | EXTENDED_RELEASE_TABLET | Freq: Three times a day (TID) | ORAL | Status: DC

## 2015-01-13 NOTE — Progress Notes (Signed)
Subjective:  Patient ID: [name], female    DOB: 04-07-1941  Age: 74 y.o. MRN: [id number]  CC: Hypothyroidism and Hypertension   HPI Keila Turan presents for a follow-up on hypertension and hypothyroidism. Her husband does most of the talking. She is declining due to Parkinson's disease and dementia.  Outpatient Prescriptions Prior to Visit  Medication Sig Dispense Refill  . aspirin 81 MG tablet Take 81 mg by mouth daily.    . carbidopa-levodopa-entacapone (STALEVO) 25-100-200 MG per tablet TAKE 1 TABLET BY MOUTH 4 (FOUR) TIMES DAILY. 120 tablet 12  . levothyroxine (SYNTHROID, LEVOTHROID) 75 MCG tablet Take 1 tablet by mouth daily.  5  . lisinopril (PRINIVIL,ZESTRIL) 10 MG tablet Take 10 mg by mouth daily.    . clopidogrel (PLAVIX) 75 MG tablet TAKE 1 TABLET (75 MG TOTAL) BY MOUTH DAILY. 60 tablet 5   No facility-administered medications prior to visit.    ROS Review of Systems  Constitutional: Positive for activity change (she does not have much of an appetite), fatigue and unexpected weight change (5 pound weight loss). Negative for fever, chills, diaphoresis and appetite change.  HENT: Negative.  Negative for trouble swallowing.   Eyes: Negative.   Respiratory: Negative.  Negative for cough, choking and stridor.   Cardiovascular: Negative.  Negative for chest pain, palpitations and leg swelling.  Gastrointestinal: Negative.  Negative for nausea, abdominal pain, diarrhea, constipation and blood in stool.  Endocrine: Negative.   Genitourinary: Negative.  Negative for difficulty urinating.  Musculoskeletal: Negative.   Skin: Negative.   Allergic/Immunologic: Negative.   Neurological: Negative.   Hematological: Negative.  Negative for adenopathy. Does not bruise/bleed easily.  Psychiatric/Behavioral: Negative.     Objective:  BP 116/64 mmHg  Pulse 58  Temp(Src) 98.4 F (36.9 C) (Oral)  Resp 16  Ht 4\' 11"  (1.499 m)  Wt 86 lb 4 oz (39.123 kg)  BMI 17.41 kg/m2   SpO2 99%  BP Readings from Last 3 Encounters:  01/13/15 116/64  11/16/14 106/68  10/12/14 121/61    Wt Readings from Last 3 Encounters:  01/13/15 86 lb 4 oz (39.123 kg)  11/16/14 90 lb 9.6 oz (41.096 kg)  10/12/14 88 lb 12.8 oz (40.279 kg)    Physical Exam  Constitutional: She is oriented to person, place, and time. No distress.  HENT:  Mouth/Throat: Oropharynx is clear and moist. No oropharyngeal exudate.  Eyes: Conjunctivae are normal. Right eye exhibits no discharge. Left eye exhibits no discharge. No scleral icterus.  Neck: Normal range of motion. Neck supple. No JVD present. No tracheal deviation present. No thyromegaly present.  Cardiovascular: Normal rate, regular rhythm, normal heart sounds and intact distal pulses.  Exam reveals no gallop and no friction rub.   No murmur heard. Pulmonary/Chest: Effort normal. No stridor. No respiratory distress. She has no wheezes. She has no rales. She exhibits no tenderness.  Abdominal: Soft. Bowel sounds are normal. She exhibits no distension and no mass. There is no tenderness. There is no rebound and no guarding.  Musculoskeletal: Normal range of motion. She exhibits no edema or tenderness.  Lymphadenopathy:    She has no cervical adenopathy.  Neurological: She is oriented to person, place, and time.  Skin: Skin is warm and dry. No rash noted. She is not diaphoretic. No erythema. No pallor.  Psychiatric: She has a normal mood and affect. Thought content normal. Her speech is delayed and tangential. Her speech is not rapid and/or pressured and not slurred. She is slowed and withdrawn.  She is not agitated and not hyperactive. Cognition and memory are impaired. She is communicative. She exhibits abnormal remote memory. She is inattentive.    Lab Results  Component Value Date   WBC 4.5 09/13/2014   HGB 13.5 09/13/2014   HCT 40.7 09/13/2014   PLT 209.0 09/13/2014   GLUCOSE 88 01/13/2015   CHOL 176 09/13/2014   TRIG 111.0 09/13/2014    HDL 61.70 09/13/2014   LDLDIRECT 142.2 10/28/2008   LDLCALC 92 09/13/2014   ALT 6 09/13/2014   AST 20 09/13/2014   NA 147* 01/13/2015   K 3.3* 01/13/2015   CL 106 01/13/2015   CREATININE 0.93 01/13/2015   BUN 19 01/13/2015   CO2 37* 01/13/2015   TSH 0.57 01/13/2015   INR 1.10 05/19/2012   HGBA1C 5.4 09/13/2014   MICROALBUR <0.7 09/13/2014    Dg Elbow Complete Right  05/22/2014   CLINICAL DATA:  Status post fall onto right side, with right lateral and posterior elbow pain, acute onset. Initial encounter.  EXAM: RIGHT ELBOW - COMPLETE 3+ VIEW  COMPARISON:  None.  FINDINGS: There is no evidence of fracture or dislocation. Mild degenerative change is noted at the coronoid process. The visualized joint spaces are preserved. No significant joint effusion is identified. The soft tissues are unremarkable in appearance.  IMPRESSION: No evidence of fracture or dislocation.   Electronically Signed   By: Garald Balding M.D.   On: 05/22/2014 00:35   Dg Humerus Right  05/22/2014   CLINICAL DATA:  Status post fall onto right side, with acute onset of right proximal anterolateral humerus pain. Initial encounter.  EXAM: RIGHT HUMERUS - 2+ VIEW  COMPARISON:  None.  FINDINGS: There is no evidence fracture or dislocation. The humerus appears intact. Apparent irregularity at the proximal radius reflects degenerative change about the coronoid process, on correlation with concurrent elbow radiographs.  The right humeral head remains seated in the glenoid fossa. The right acromioclavicular joint is grossly unremarkable in appearance. The visualized portions of the right lung are clear. No significant soft tissue abnormalities are characterized on radiograph.  IMPRESSION: No evidence of fracture or dislocation.   Electronically Signed   By: Garald Balding M.D.   On: 05/22/2014 00:37    Assessment & Plan:   Lorely was seen today for hypothyroidism and hypertension.  Diagnoses and all orders for this  visit:  Essential hypertension- her blood pressure is well controlled, will replete her potassium level. Orders: -     Basic metabolic panel; Future  Other specified hypothyroidism- TSH is in the normal range, we'll continue the current dose. Orders: -     TSH; Future  Hypokalemia- this is related to dietary intake, will treat with an oral potassium supplement Orders: -     potassium chloride SA (K-DUR,KLOR-CON) 20 MEQ tablet; Take 1 tablet (20 mEq total) by mouth 3 (three) times daily.  I have discontinued Ms. Forquer clopidogrel. I am also having her start on potassium chloride SA. Additionally, I am having her maintain her lisinopril, levothyroxine, aspirin, and carbidopa-levodopa-entacapone.  Meds ordered this encounter  Medications  . potassium chloride SA (K-DUR,KLOR-CON) 20 MEQ tablet    Sig: Take 1 tablet (20 mEq total) by mouth 3 (three) times daily.    Dispense:  90 tablet    Refill:  1     Follow-up: Return in about 6 months (around 07/16/2015).  Scarlette Calico, MD

## 2015-01-13 NOTE — Patient Instructions (Signed)
Hypothyroidism The thyroid is a large gland located in the lower front of your neck. The thyroid gland helps control metabolism. Metabolism is how your body handles food. It controls metabolism with the hormone thyroxine. When this gland is underactive (hypothyroid), it produces too little hormone.  CAUSES These include:   Absence or destruction of thyroid tissue.  Goiter due to iodine deficiency.  Goiter due to medications.  Congenital defects (since birth).  Problems with the pituitary. This causes a lack of TSH (thyroid stimulating hormone). This hormone tells the thyroid to turn out more hormone. SYMPTOMS  Lethargy (feeling as though you have no energy)  Cold intolerance  Weight gain (in spite of normal food intake)  Dry skin  Coarse hair  Menstrual irregularity (if severe, may lead to infertility)  Slowing of thought processes Cardiac problems are also caused by insufficient amounts of thyroid hormone. Hypothyroidism in the newborn is cretinism, and is an extreme form. It is important that this form be treated adequately and immediately or it will lead rapidly to retarded physical and mental development. DIAGNOSIS  To prove hypothyroidism, your caregiver may do blood tests and ultrasound tests. Sometimes the signs are hidden. It may be necessary for your caregiver to watch this illness with blood tests either before or after diagnosis and treatment. TREATMENT  Low levels of thyroid hormone are increased by using synthetic thyroid hormone. This is a safe, effective treatment. It usually takes about four weeks to gain the full effects of the medication. After you have the full effect of the medication, it will generally take another four weeks for problems to leave. Your caregiver may start you on low doses. If you have had heart problems the dose may be gradually increased. It is generally not an emergency to get rapidly to normal. HOME CARE INSTRUCTIONS   Take your  medications as your caregiver suggests. Let your caregiver know of any medications you are taking or start taking. Your caregiver will help you with dosage schedules.  As your condition improves, your dosage needs may increase. It will be necessary to have continuing blood tests as suggested by your caregiver.  Report all suspected medication side effects to your caregiver. SEEK MEDICAL CARE IF: Seek medical care if you develop:  Sweating.  Tremulousness (tremors).  Anxiety.  Rapid weight loss.  Heat intolerance.  Emotional swings.  Diarrhea.  Weakness. SEEK IMMEDIATE MEDICAL CARE IF:  You develop chest pain, an irregular heart beat (palpitations), or a rapid heart beat. MAKE SURE YOU:   Understand these instructions.  Will watch your condition.  Will get help right away if you are not doing well or get worse. Document Released: 06/11/2005 Document Revised: 09/03/2011 Document Reviewed: 01/30/2008 Exit[employer] Patient Information 2015 [employer]. This information is not intended to replace advice given to you by your health care provider. Make sure you discuss any questions you have with your health care provider.  

## 2015-01-13 NOTE — Progress Notes (Signed)
Pre visit review using our clinic review tool, if applicable. No additional management support is needed unless otherwise documented below in the visit note. 

## 2015-03-16 DIAGNOSIS — M810 Age-related osteoporosis without current pathological fracture: Secondary | ICD-10-CM | POA: Diagnosis not present

## 2015-03-16 DIAGNOSIS — Z853 Personal history of malignant neoplasm of breast: Secondary | ICD-10-CM | POA: Diagnosis not present

## 2015-03-16 DIAGNOSIS — Z780 Asymptomatic menopausal state: Secondary | ICD-10-CM | POA: Diagnosis not present

## 2015-03-16 LAB — HM DEXA SCAN: HM Dexa Scan: -2.6

## 2015-03-16 LAB — HM MAMMOGRAPHY

## 2015-03-18 ENCOUNTER — Encounter: Payer: Self-pay | Admitting: Internal Medicine

## 2015-04-04 ENCOUNTER — Other Ambulatory Visit: Payer: Self-pay | Admitting: Internal Medicine

## 2015-04-04 DIAGNOSIS — M810 Age-related osteoporosis without current pathological fracture: Secondary | ICD-10-CM

## 2015-04-04 NOTE — Addendum Note (Signed)
Addended by: Janith Lima on: 04/04/2015 07:57 AM   Modules accepted: Miquel Dunn

## 2015-04-07 ENCOUNTER — Encounter: Payer: Self-pay | Admitting: Internal Medicine

## 2015-04-08 ENCOUNTER — Ambulatory Visit: Payer: Medicare Other | Admitting: Endocrinology

## 2015-04-18 ENCOUNTER — Ambulatory Visit (INDEPENDENT_AMBULATORY_CARE_PROVIDER_SITE_OTHER): Payer: Medicare Other | Admitting: Endocrinology

## 2015-04-18 ENCOUNTER — Encounter: Payer: Self-pay | Admitting: Endocrinology

## 2015-04-18 VITALS — BP 114/62 | HR 56 | Temp 98.2°F | Ht 59.0 in | Wt 92.5 lb

## 2015-04-18 DIAGNOSIS — E039 Hypothyroidism, unspecified: Secondary | ICD-10-CM | POA: Diagnosis not present

## 2015-04-18 DIAGNOSIS — N200 Calculus of kidney: Secondary | ICD-10-CM | POA: Diagnosis not present

## 2015-04-18 DIAGNOSIS — M810 Age-related osteoporosis without current pathological fracture: Secondary | ICD-10-CM | POA: Insufficient documentation

## 2015-04-18 LAB — TSH: TSH: 8.44 u[IU]/mL — AB (ref 0.35–4.50)

## 2015-04-18 LAB — VITAMIN D 25 HYDROXY (VIT D DEFICIENCY, FRACTURES): VITD: 51.89 ng/mL (ref 30.00–100.00)

## 2015-04-18 MED ORDER — LEVOTHYROXINE SODIUM 50 MCG PO TABS: 50.0000 ug | ORAL_TABLET | Freq: Every day | ORAL | Status: DC

## 2015-04-18 NOTE — Patient Instructions (Addendum)
blood tests are requested for you today.  We'll let you know about the results.  Let's also check a bone-density test.   Based on there results, i hope to be able to prescribe medication for osteoporosis.   it is critically important to prevent falling down (keep floor areas well-lit, dry, and free of loose objects.  If you have a cane, walker, or wheelchair, you should use it, even for short trips around the house.  Also, try not to rush)

## 2015-04-18 NOTE — Progress Notes (Signed)
Subjective:    Patient ID: [name], female    DOB: [birth year], 74 y.o.   MRN: [id number]  HPI Pt was noted to have osteoporosis in 2016.  She has never been on medication for this.  she has never had bony fracture.  She has no history of any of the following: early menopause, multiple myeloma, renal dz, prolonged bedrest, steriods, alcoholism, smoking, liver dz, vid-d deficiency, or hyperparathyroidism.  She does not take heparin or anticonvulsants.  She has h/o urolithiasis. Husband says she has not taken synthroid in several years.  She has chronic moderate pain at the lower back, but no assoc gerd. Past Medical History  Diagnosis Date  . Glaucoma   . Abnormal chest x-ray   . Hypertension   . Peripheral vascular disease ([place])   . Venous insufficiency   . Hypercholesteremia   . Hypothyroid   . Diverticulosis of colon   . DJD (degenerative joint disease)   . Hip pain   . Lumbar back pain   . Osteopenia   . Vitamin D deficiency   . Parkinson disease ([place])   . Anxiety   . Breast cancer ([place])     stage 0 left    Past Surgical History  Procedure Laterality Date  . Ovarian cyst removal    . Lapraoscopic cholecystectomy  09/2003    Dr. Dalbert Batman  . Lumbar laminectomy and foraminotomies for spinal stenosis  11/2005    Dr. Gladstone Lighter  . Cataract surgery and argon laser trabeculoplasty  04/2010    Dr. Herbert Deaner  . Colonoscopy    . Polypectomy    . Cholecystectomy    . Breast lumpectomy  8/11 by DrWakefield  . Incise and drain abcess  10/04/11    sebaceous cyst on back  . Lumbar laminectomy/decompression microdiscectomy  05/20/2012    Procedure: LUMBAR LAMINECTOMY/DECOMPRESSION MICRODISCECTOMY 2 LEVELS;  Surgeon: Tobi Bastos, MD;  Location: [facility];  Service: Orthopedics;  Laterality: N/A;    Social History   Social History  . Marital Status: Married    Spouse Name: [name]  . Number of Children: 2  . Years of Education: Assoc   Occupational History  . retired   .      Social History Main Topics  . Smoking status: Never Smoker   . Smokeless tobacco: Never Used  . Alcohol Use: No  . Drug Use: No  . Sexual Activity: Not Currently   Other Topics Concern  . Not on file   Social History Narrative   Pt lives at home with spouse.   Caffeine Use: Very little; once a week    Current Outpatient Prescriptions on File Prior to Visit  Medication Sig Dispense Refill  . aspirin 81 MG tablet Take 81 mg by mouth daily.    . carbidopa-levodopa-entacapone (STALEVO) 25-100-200 MG per tablet TAKE 1 TABLET BY MOUTH 4 (FOUR) TIMES DAILY. 120 tablet 12  . lisinopril (PRINIVIL,ZESTRIL) 10 MG tablet Take 10 mg by mouth daily.    . potassium chloride SA (K-DUR,KLOR-CON) 20 MEQ tablet Take 1 tablet (20 mEq total) by mouth 3 (three) times daily. 90 tablet 1   No current facility-administered medications on file prior to visit.    No Known Allergies  Family History  Problem Relation Age of Onset  . Colon cancer Father   . Osteoporosis Neg Hx     BP 114/62 mmHg  Pulse 56  Temp(Src) 98.2 F (36.8 C) (Oral)  Ht _0  (1.499 m)  Wt 92  lb 8 oz (41.958 kg)  BMI 18.67 kg/m2  SpO2 99%  Review of Systems denies weight loss, hematuria, n/v, cold intolerance, edema, skin rash, insomnia, falls, cramps, back pain, and easy bruising.  She has memory loss and rhinorrhea.      Objective:   Physical Exam VS: see vs page GEN: no distress HEAD: head: no deformity eyes: no periorbital swelling, no proptosis external nose and ears are normal mouth: no lesion seen NECK: supple, thyroid is not enlarged CHEST WALL: no deformity.  Slight kyphosis. LUNGS:  Clear to auscultation CV: reg rate and rhythm; soft systolic murmur is noted ABD: abdomen is soft, nontender.  no hepatosplenomegaly.  not distended.  no hernia MUSCULOSKELETAL: muscle bulk and strength are grossly normal.  no obvious joint swelling.  gait is slow but steady.  EXTEMITIES: no deformity.  no ulcer on the  feet.  feet are of normal color and temp.  no edema PULSES: dorsalis pedis intact bilat.  no carotid bruit NEURO:  cn 2-12 grossly intact.   readily moves all 4's.  sensation is intact to touch on the feet SKIN:  Normal texture and temperature.  No rash or suspicious lesion is visible.   NODES:  None palpable at the neck.  PSYCH: alert, well-oriented.  Does not appear anxious nor depressed.      Lab Results  Component Value Date   PTH 67* 04/18/2015   CALCIUM 9.1 04/18/2015   Lab Results  Component Value Date   TSH 8.44* 04/18/2015   Radio: CXR: (06/17/13): no mention is made of kyphosis.      Assessment & Plan:  Osteoporosis, new to me, uncertain etiology.   Hypothyroidism, therapy limited by noncompliance.  i'll do the best i can.    Patient is advised the following: Patient Instructions  blood tests are requested for you today.  We'll let you know about the results.  Let's also check a bone-density test.   Based on there results, i hope to be able to prescribe medication for osteoporosis.   it is critically important to prevent falling down (keep floor areas well-lit, dry, and free of loose objects.  If you have a cane, walker, or wheelchair, you should use it, even for short trips around the house.  Also, try not to rush)

## 2015-04-19 LAB — PTH, INTACT AND CALCIUM
CALCIUM: 9.1 mg/dL (ref 8.4–10.5)
PTH: 67 pg/mL — AB (ref 14–64)

## 2015-04-20 LAB — PROTEIN ELECTROPHORESIS, SERUM
ALBUMIN ELP: 4 g/dL (ref 3.8–4.8)
ALPHA-1-GLOBULIN: 0.2 g/dL (ref 0.2–0.3)
Alpha-2-Globulin: 0.6 g/dL (ref 0.5–0.9)
Beta 2: 0.2 g/dL (ref 0.2–0.5)
Beta Globulin: 0.3 g/dL — ABNORMAL LOW (ref 0.4–0.6)
GAMMA GLOBULIN: 0.6 g/dL — AB (ref 0.8–1.7)
Total Protein, Serum Electrophoresis: 5.9 g/dL — ABNORMAL LOW (ref 6.1–8.1)

## 2015-04-21 DIAGNOSIS — N209 Urinary calculus, unspecified: Secondary | ICD-10-CM | POA: Insufficient documentation

## 2015-05-02 ENCOUNTER — Inpatient Hospital Stay: Admission: RE | Admit: 2015-05-02 | Payer: Medicare Other | Source: Ambulatory Visit

## 2015-06-13 ENCOUNTER — Emergency Department (INDEPENDENT_AMBULATORY_CARE_PROVIDER_SITE_OTHER)
Admission: EM | Admit: 2015-06-13 | Discharge: 2015-06-13 | Disposition: A | Discharge status: Home / Self Care | Payer: Medicare Other | Source: Home / Self Care | Attending: Family Medicine | Admitting: Family Medicine

## 2015-06-13 ENCOUNTER — Encounter (HOSPITAL_COMMUNITY): Payer: Self-pay | Admitting: *Deleted

## 2015-06-13 DIAGNOSIS — E876 Hypokalemia: Secondary | ICD-10-CM

## 2015-06-13 DIAGNOSIS — S0181XA Laceration without foreign body of other part of head, initial encounter: Secondary | ICD-10-CM | POA: Diagnosis not present

## 2015-06-13 MED ORDER — TETANUS-DIPHTH-ACELL PERTUSSIS 5-2.5-18.5 LF-MCG/0.5 IM SUSP
INTRAMUSCULAR | Status: AC
  Filled 2015-06-13: qty 0.5

## 2015-06-13 MED ORDER — TETANUS-DIPHTH-ACELL PERTUSSIS 5-2.5-18.5 LF-MCG/0.5 IM SUSP
0.5000 mL | Freq: Once | INTRAMUSCULAR | Status: AC
  Administered 2015-06-13: 0.5 mL via INTRAMUSCULAR

## 2015-06-13 NOTE — ED Provider Notes (Signed)
CSN: [id number]     Arrival date & time 06/13/15  1543 History   First MD Initiated Contact with Patient 06/13/15 1621     Chief Complaint  Patient presents with  . Fall  . Facial Laceration   (Consider location/radiation/quality/duration/timing/severity/associated sxs/prior Treatment) Patient is a 74 y.o. female presenting with fall. The history is provided by the spouse. The history is limited by the condition of the patient.  Fall This is a recurrent (pt with parkinsons disease.) problem. The current episode started 1 to 2 hours ago (stumbled at home and struck head on [place] cabinet. sustaining lac, no loc.). The problem has not changed since onset.Pertinent negatives include no headaches and no shortness of breath.    Past Medical History  Diagnosis Date  . Glaucoma   . Abnormal chest x-ray   . Hypertension   . Peripheral vascular disease ([place])   . Venous insufficiency   . Hypercholesteremia   . Hypothyroid   . Diverticulosis of colon   . DJD (degenerative joint disease)   . Hip pain   . Lumbar back pain   . Osteopenia   . Vitamin D deficiency   . Parkinson disease ([place])   . Anxiety   . Breast cancer ([place])     stage 0 left   Past Surgical History  Procedure Laterality Date  . Ovarian cyst removal    . Lapraoscopic cholecystectomy  09/2003    Dr. Dalbert Batman  . Lumbar laminectomy and foraminotomies for spinal stenosis  11/2005    Dr. Gladstone Lighter  . Cataract surgery and argon laser trabeculoplasty  04/2010    Dr. Herbert Deaner  . Colonoscopy    . Polypectomy    . Cholecystectomy    . Breast lumpectomy  8/11 by DrWakefield  . Incise and drain abcess  10/04/11    sebaceous cyst on back  . Lumbar laminectomy/decompression microdiscectomy  05/20/2012    Procedure: LUMBAR LAMINECTOMY/DECOMPRESSION MICRODISCECTOMY 2 LEVELS;  Surgeon: Tobi Bastos, MD;  Location: [facility];  Service: Orthopedics;  Laterality: N/A;   Family History  Problem Relation Age of Onset  . Colon cancer Father    . Osteoporosis Neg Hx    Social History  Substance Use Topics  . Smoking status: Never Smoker   . Smokeless tobacco: Never Used  . Alcohol Use: No   OB History    No data available     Review of Systems  Constitutional: Negative.   HENT: Negative.   Respiratory: Negative for shortness of breath.   Musculoskeletal: Negative.   Skin: Positive for wound.  Neurological: Positive for speech difficulty. Negative for headaches.  All other systems reviewed and are negative.   Allergies  Review of patient's allergies indicates no known allergies.  Home Medications   Prior to Admission medications   Medication Sig Start Date End Date Taking? Authorizing Provider  aspirin 81 MG tablet Take 81 mg by mouth daily.    Historical Provider, MD  carbidopa-levodopa-entacapone (STALEVO) 25-100-200 MG per tablet TAKE 1 TABLET BY MOUTH 4 (FOUR) TIMES DAILY. 11/16/14   Penni Bombard, MD  levothyroxine (SYNTHROID, LEVOTHROID) 50 MCG tablet Take 1 tablet (50 mcg total) by mouth daily before breakfast. 04/18/15   Renato Shin, MD  lisinopril (PRINIVIL,ZESTRIL) 10 MG tablet Take 10 mg by mouth daily.    Historical Provider, MD  potassium chloride SA (K-DUR,KLOR-CON) 20 MEQ tablet Take 1 tablet (20 mEq total) by mouth 3 (three) times daily. 01/13/15   Janith Lima, MD  Meds Ordered and Administered this Visit  Medications - No data to display  BP 167/79 mmHg  Pulse 49  Temp(Src) 98.1 F (36.7 C) (Oral)  Resp 16  SpO2 98% No data found.   Physical Exam  Constitutional: Vital signs are normal. She appears well-developed and well-nourished. She is active and cooperative. She appears ill.    Able to get on exam table without assist.. Nl motor fxn.  HENT:  Right Ear: External ear normal.  Left Ear: External ear normal.  Musculoskeletal: She exhibits no tenderness.  Neurological: She is alert.  Skin: Skin is warm and dry.  Nursing note and vitals reviewed.   ED Course    .Marland KitchenLaceration Repair Date/Time: 06/13/2015 4:47 PM Performed by: Billy Fischer Authorized by: Ihor Gully D Consent: Verbal consent obtained. Consent given by: spouse Body area: head/neck Location details: forehead Laceration length: 1.5 cm Tendon involvement: none Nerve involvement: none Vascular damage: no Patient sedated: no Irrigation solution: tap water Irrigation method: tap Amount of cleaning: standard Debridement: none Degree of undermining: none Skin closure: glue Technique: simple Approximation: close Approximation difficulty: simple Patient tolerance: Patient tolerated the procedure well with no immediate complications   (including critical care time)  Labs Review Labs Reviewed - No data to display  Imaging Review No results found.   Visual Acuity Review  Right Eye Distance:   Left Eye Distance:   Bilateral Distance:    Right Eye Near:   Left Eye Near:    Bilateral Near:         MDM   1. Forehead laceration, initial encounter    dermabond closure of lac.    Billy Fischer, MD 06/13/15 [phone]

## 2015-06-13 NOTE — ED Notes (Signed)
Spouse brought in patient to have laceration to left forehead from fall evaluated . States she lost her balance and fell into cabinet earlier today. Denies LOC, denies pain any other area. Bleeding controlled at present

## 2015-06-13 NOTE — ED Notes (Signed)
Pt   Reports     She  Today  She  Has  parkinsons      And  Sustained  A  Laceration  To    Top  Of  Forehead       She  Did  Not  Lose  concoussnes  And  Is  At  Her  Baseline  According  To  Family  Members        she  Is sitting  Upright on the  Exam table  Speaking in  Complete  sentances

## 2015-06-13 NOTE — Discharge Instructions (Signed)
Tylenol for soreness, see your doctor or go to ER if any problems.

## 2015-08-05 ENCOUNTER — Ambulatory Visit (INDEPENDENT_AMBULATORY_CARE_PROVIDER_SITE_OTHER): Payer: Medicare Other | Admitting: Internal Medicine

## 2015-08-05 ENCOUNTER — Other Ambulatory Visit (INDEPENDENT_AMBULATORY_CARE_PROVIDER_SITE_OTHER): Payer: Medicare Other

## 2015-08-05 ENCOUNTER — Ambulatory Visit (INDEPENDENT_AMBULATORY_CARE_PROVIDER_SITE_OTHER)
Admission: RE | Admit: 2015-08-05 | Discharge: 2015-08-05 | Disposition: A | Discharge status: Home / Self Care | Payer: Medicare Other | Source: Ambulatory Visit | Attending: Internal Medicine | Admitting: Internal Medicine

## 2015-08-05 ENCOUNTER — Encounter: Payer: Self-pay | Admitting: Internal Medicine

## 2015-08-05 VITALS — BP 80/60 | HR 55 | Temp 97.5°F | Ht 59.0 in | Wt 96.8 lb

## 2015-08-05 DIAGNOSIS — S0083XA Contusion of other part of head, initial encounter: Secondary | ICD-10-CM

## 2015-08-05 DIAGNOSIS — I10 Essential (primary) hypertension: Secondary | ICD-10-CM | POA: Diagnosis not present

## 2015-08-05 DIAGNOSIS — I951 Orthostatic hypotension: Secondary | ICD-10-CM | POA: Insufficient documentation

## 2015-08-05 DIAGNOSIS — G20 Parkinson's disease: Secondary | ICD-10-CM | POA: Diagnosis not present

## 2015-08-05 DIAGNOSIS — R040 Epistaxis: Secondary | ICD-10-CM | POA: Insufficient documentation

## 2015-08-05 DIAGNOSIS — R202 Paresthesia of skin: Secondary | ICD-10-CM

## 2015-08-05 DIAGNOSIS — R296 Repeated falls: Secondary | ICD-10-CM | POA: Diagnosis not present

## 2015-08-05 DIAGNOSIS — S0990XA Unspecified injury of head, initial encounter: Secondary | ICD-10-CM | POA: Diagnosis not present

## 2015-08-05 LAB — BASIC METABOLIC PANEL
BUN: 17 mg/dL (ref 6–23)
CHLORIDE: 107 meq/L (ref 96–112)
CO2: 30 mEq/L (ref 19–32)
CREATININE: 0.99 mg/dL (ref 0.40–1.20)
Calcium: 9.3 mg/dL (ref 8.4–10.5)
GFR: 69.99 mL/min (ref >60.00)
Glucose, Bld: 93 mg/dL (ref 70–99)
POTASSIUM: 4.4 meq/L (ref 3.5–5.1)
SODIUM: 142 meq/L (ref 135–145)

## 2015-08-05 LAB — VITAMIN B12: Vitamin B-12: 472 pg/mL (ref 211–911)

## 2015-08-05 LAB — TSH: TSH: 47.86 u[IU]/mL — AB (ref 0.35–4.50)

## 2015-08-05 LAB — CORTISOL: CORTISOL PLASMA: 9.4 ug/dL

## 2015-08-05 MED ORDER — VITAMIN D 1000 UNITS PO TABS: 1000.0000 [IU] | ORAL_TABLET | Freq: Every day | ORAL | Status: DC

## 2015-08-05 NOTE — Progress Notes (Signed)
Subjective:  Patient ID: [name], female    DOB: 10-28-1940  Age: 75 y.o. MRN: [id number]  CC: No chief complaint on file.   HPI [name] presents for a fall in the kitchen on Sat last week  - fell flat on the face - nose bled. Pt fell a couple more times. Pt is having visual hallucinations. Not using a cane, [name]. Dementia. Pt had diarrhea - stopped  Outpatient Prescriptions Prior to Visit  Medication Sig Dispense Refill  . aspirin 81 MG tablet Take 81 mg by mouth daily.    . carbidopa-levodopa-entacapone (STALEVO) 25-100-200 MG per tablet TAKE 1 TABLET BY MOUTH 4 (FOUR) TIMES DAILY. 120 tablet 12  . levothyroxine (SYNTHROID, LEVOTHROID) 50 MCG tablet Take 1 tablet (50 mcg total) by mouth daily before breakfast. 30 tablet 11  . lisinopril (PRINIVIL,ZESTRIL) 10 MG tablet Take 10 mg by mouth daily.    . potassium chloride SA (K-DUR,KLOR-CON) 20 MEQ tablet Take 1 tablet (20 mEq total) by mouth 3 (three) times daily. 90 tablet 1   No facility-administered medications prior to visit.    ROS Review of Systems  Constitutional: Positive for fatigue. Negative for chills, activity change, appetite change and unexpected weight change.  HENT: Negative for congestion, mouth sores and sinus pressure.   Eyes: Negative for visual disturbance.  Respiratory: Negative for cough and chest tightness.   Gastrointestinal: Positive for diarrhea. Negative for nausea and abdominal pain.  Genitourinary: Positive for enuresis. Negative for urgency, frequency, difficulty urinating and vaginal pain.  Musculoskeletal: Positive for gait problem. Negative for back pain.  Skin: Negative for pallor and rash.  Neurological: Positive for tremors and weakness. Negative for dizziness, numbness and headaches.  Psychiatric/Behavioral: Positive for hallucinations and decreased concentration. Negative for suicidal ideas, confusion and sleep disturbance. The patient is nervous/anxious.     Objective:  BP  95/85 mmHg  Pulse 55  Temp(Src) 97.5 F (36.4 C) (Oral)  Ht 4\' 11"  (1.499 m)  Wt 96 lb 12 oz (43.886 kg)  BMI 19.53 kg/m2  SpO2 97%  BP Readings from Last 3 Encounters:  08/05/15 95/85  06/13/15 167/79  04/18/15 114/62    Wt Readings from Last 3 Encounters:  08/05/15 96 lb 12 oz (43.886 kg)  04/18/15 92 lb 8 oz (41.958 kg)  01/13/15 86 lb 4 oz (39.123 kg)    Physical Exam  Constitutional: She appears well-developed. No distress.  HENT:  Head: Normocephalic.  Right Ear: External ear normal.  Left Ear: External ear normal.  Nose: Nose normal.  Mouth/Throat: Oropharynx is clear and moist.  Eyes: Conjunctivae are normal. Pupils are equal, round, and reactive to light. Right eye exhibits no discharge. Left eye exhibits no discharge.  Neck: Normal range of motion. Neck supple. No JVD present. No tracheal deviation present. No thyromegaly present.  Cardiovascular: Normal rate, regular rhythm and normal heart sounds.   Pulmonary/Chest: No stridor. No respiratory distress. She has no wheezes.  Abdominal: Soft. Bowel sounds are normal. She exhibits no distension and no mass. There is no tenderness. There is no rebound and no guarding.  Musculoskeletal: She exhibits tenderness. She exhibits no edema.  Lymphadenopathy:    She has no cervical adenopathy.  Neurological: She displays normal reflexes. No cranial nerve deficit. She exhibits normal muscle tone. Coordination abnormal.  Skin: No rash noted. No erythema.  Psychiatric: Judgment normal.  ataxic, quiet, disoriented R eye bruised Dry blood in R nostril BP dropped w/sitting up  Lab Results  Component Value  Date   WBC 4.5 09/13/2014   HGB 13.5 09/13/2014   HCT 40.7 09/13/2014   PLT 209.0 09/13/2014   GLUCOSE 88 01/13/2015   CHOL 176 09/13/2014   TRIG 111.0 09/13/2014   HDL 61.70 09/13/2014   LDLDIRECT 142.2 10/28/2008   LDLCALC 92 09/13/2014   ALT 6 09/13/2014   AST 20 09/13/2014   NA 147* 01/13/2015   K 3.3*  01/13/2015   CL 106 01/13/2015   CREATININE 0.93 01/13/2015   BUN 19 01/13/2015   CO2 37* 01/13/2015   TSH 8.44* 04/18/2015   INR 1.10 05/19/2012   HGBA1C 5.4 09/13/2014   MICROALBUR <0.7 09/13/2014   A complex case No results found.  Assessment & Plan:   Diagnoses and all orders for this visit:  Contusion of face, initial encounter -     CT Head Wo Contrast -     CBC with Differential/Platelet; Future -     TSH; Future -     Vitamin B12; Future -     Basic metabolic panel; Future -     Cortisol; Future  Parkinson's disease ([place]) -     CT Head Wo Contrast -     CBC with Differential/Platelet; Future -     TSH; Future -     Vitamin B12; Future -     Basic metabolic panel; Future -     Cortisol; Future -     Ambulatory referral to [facility] hypertension -     CBC with Differential/Platelet; Future -     TSH; Future -     Vitamin B12; Future -     Basic metabolic panel; Future -     Cortisol; Future  Falls frequently -     CT Head Wo Contrast -     CBC with Differential/Platelet; Future -     TSH; Future -     Vitamin B12; Future -     Basic metabolic panel; Future -     Cortisol; Future -     Ambulatory referral to Home Health  Paresthesia -     TSH; Future -     Vitamin B12; Future  Orthostatic hypotension  Other orders -     cholecalciferol (VITAMIN D) 1000 units tablet; Take 1 tablet (1,000 Units total) by mouth daily.  I have discontinued [name] lisinopril, potassium chloride SA, and traMADol. I am also having her start on cholecalciferol. Additionally, I am having her maintain her aspirin, carbidopa-levodopa-entacapone, and levothyroxine.  Meds ordered this encounter  Medications  . DISCONTD: traMADol (ULTRAM) 50 MG tablet    Sig: Reported on 08/05/2015  . cholecalciferol (VITAMIN D) 1000 units tablet    Sig: Take 1 tablet (1,000 Units total) by mouth daily.    Dispense:  100 tablet    Refill:  3     Follow-up: Return in  about 2 weeks (around 08/19/2015) for a follow-up visit.  Kristin Kehr, MD

## 2015-08-05 NOTE — Progress Notes (Signed)
Pre visit review using our clinic review tool, if applicable. No additional management support is needed unless otherwise documented below in the visit note. 

## 2015-08-05 NOTE — Assessment & Plan Note (Signed)
Off Rx 

## 2015-08-05 NOTE — Assessment & Plan Note (Signed)
2/17 No active bleed

## 2015-08-05 NOTE — Assessment & Plan Note (Signed)
Labs Off BP Rx Increase water intake

## 2015-08-05 NOTE — Assessment & Plan Note (Signed)
Progressing - worse 2/17 - falls Sinemet Dr Tish Frederickson

## 2015-08-06 LAB — CBC WITH DIFFERENTIAL/PLATELET
BASOS ABS: 0 10*3/uL (ref 0.0–0.1)
Basophils Relative: 1 % (ref 0.0–3.0)
Eosinophils Absolute: 0 10*3/uL (ref 0.0–0.7)
Eosinophils Relative: 1.3 % (ref 0.0–5.0)
HEMATOCRIT: 42.7 % (ref 36.0–46.0)
HEMOGLOBIN: 13.2 g/dL (ref 12.0–15.0)
LYMPHS PCT: 33.2 % (ref 12.0–46.0)
Lymphs Abs: 1 10*3/uL (ref 0.7–4.0)
MCHC: 30.9 g/dL (ref 30.0–36.0)
MCV: 99.5 fl (ref 78.0–100.0)
MONOS PCT: 4.7 % (ref 3.0–12.0)
Monocytes Absolute: 0.1 10*3/uL (ref 0.1–1.0)
NEUTROS ABS: 1.8 10*3/uL (ref 1.4–7.7)
NEUTROS PCT: 59.8 % (ref 43.0–77.0)
Platelets: 198 10*3/uL (ref 150.0–400.0)
RBC: 4.29 Mil/uL (ref 3.87–5.11)
RDW: 14 % (ref 11.5–15.5)
WBC: 3 10*3/uL — ABNORMAL LOW (ref 4.0–10.5)

## 2015-08-07 ENCOUNTER — Other Ambulatory Visit: Payer: Self-pay | Admitting: Internal Medicine

## 2015-08-07 MED ORDER — LEVOTHYROXINE SODIUM 50 MCG PO TABS: 100.0000 ug | ORAL_TABLET | Freq: Every day | ORAL | Status: DC

## 2015-08-08 ENCOUNTER — Telehealth: Payer: Self-pay | Admitting: Internal Medicine

## 2015-08-08 NOTE — Telephone Encounter (Signed)
[name] called back, please give her a call and it is okey to leave detail massage on this # if not answer.

## 2015-08-08 NOTE — Progress Notes (Signed)
Quick Note:  Left message with pt dtr ______

## 2015-08-08 NOTE — Telephone Encounter (Signed)
Please call daughter back in regards to patient labs.

## 2015-08-09 NOTE — Telephone Encounter (Signed)
Pt dtr informed of lab results and scan results.

## 2015-08-11 ENCOUNTER — Telehealth: Payer: Self-pay | Admitting: Internal Medicine

## 2015-08-11 NOTE — Telephone Encounter (Signed)
Sent to [facility], they will contact pt

## 2015-08-11 NOTE — Telephone Encounter (Signed)
Patients daughter called to follow up on [facility] referral. Patient has fallen again this morning. Please expedite this referral.

## 2015-08-12 DIAGNOSIS — G20 Parkinson's disease: Secondary | ICD-10-CM | POA: Diagnosis not present

## 2015-08-12 DIAGNOSIS — R296 Repeated falls: Secondary | ICD-10-CM | POA: Diagnosis not present

## 2015-08-15 DIAGNOSIS — G20 Parkinson's disease: Secondary | ICD-10-CM | POA: Diagnosis not present

## 2015-08-15 DIAGNOSIS — R296 Repeated falls: Secondary | ICD-10-CM | POA: Diagnosis not present

## 2015-08-16 ENCOUNTER — Other Ambulatory Visit: Payer: Self-pay | Admitting: Internal Medicine

## 2015-08-16 DIAGNOSIS — G20 Parkinson's disease: Secondary | ICD-10-CM | POA: Diagnosis not present

## 2015-08-16 DIAGNOSIS — R296 Repeated falls: Secondary | ICD-10-CM | POA: Diagnosis not present

## 2015-08-18 ENCOUNTER — Telehealth: Payer: Self-pay | Admitting: Internal Medicine

## 2015-08-18 DIAGNOSIS — R296 Repeated falls: Secondary | ICD-10-CM | POA: Diagnosis not present

## 2015-08-18 DIAGNOSIS — G20 Parkinson's disease: Secondary | ICD-10-CM | POA: Diagnosis not present

## 2015-08-18 NOTE — Telephone Encounter (Signed)
Seeing patient for upper body strength and balance for 2 week 1 and 1 week 2.

## 2015-08-19 ENCOUNTER — Other Ambulatory Visit: Payer: Self-pay | Admitting: General Practice

## 2015-08-19 DIAGNOSIS — G20 Parkinson's disease: Secondary | ICD-10-CM | POA: Diagnosis not present

## 2015-08-19 DIAGNOSIS — R296 Repeated falls: Secondary | ICD-10-CM | POA: Diagnosis not present

## 2015-08-19 MED ORDER — LEVOTHYROXINE SODIUM 50 MCG PO TABS: 100.0000 ug | ORAL_TABLET | Freq: Every day | ORAL | Status: DC

## 2015-08-19 NOTE — Telephone Encounter (Signed)
Verbals given  

## 2015-08-19 NOTE — Telephone Encounter (Signed)
yes

## 2015-08-19 NOTE — Telephone Encounter (Signed)
Are they needing a verbal?

## 2015-08-21 ENCOUNTER — Other Ambulatory Visit: Payer: Self-pay | Admitting: Internal Medicine

## 2015-08-23 DIAGNOSIS — G20 Parkinson's disease: Secondary | ICD-10-CM | POA: Diagnosis not present

## 2015-08-23 DIAGNOSIS — R296 Repeated falls: Secondary | ICD-10-CM | POA: Diagnosis not present

## 2015-08-24 DIAGNOSIS — G20 Parkinson's disease: Secondary | ICD-10-CM | POA: Diagnosis not present

## 2015-08-24 DIAGNOSIS — R296 Repeated falls: Secondary | ICD-10-CM | POA: Diagnosis not present

## 2015-08-25 DIAGNOSIS — R296 Repeated falls: Secondary | ICD-10-CM | POA: Diagnosis not present

## 2015-08-25 DIAGNOSIS — G20 Parkinson's disease: Secondary | ICD-10-CM | POA: Diagnosis not present

## 2015-08-26 DIAGNOSIS — G20 Parkinson's disease: Secondary | ICD-10-CM | POA: Diagnosis not present

## 2015-08-26 DIAGNOSIS — R296 Repeated falls: Secondary | ICD-10-CM | POA: Diagnosis not present

## 2015-08-28 ENCOUNTER — Other Ambulatory Visit: Payer: Self-pay | Admitting: Internal Medicine

## 2015-08-30 DIAGNOSIS — G20 Parkinson's disease: Secondary | ICD-10-CM | POA: Diagnosis not present

## 2015-08-30 DIAGNOSIS — R296 Repeated falls: Secondary | ICD-10-CM | POA: Diagnosis not present

## 2015-09-02 ENCOUNTER — Telehealth: Payer: Self-pay

## 2015-09-02 DIAGNOSIS — G20 Parkinson's disease: Secondary | ICD-10-CM | POA: Diagnosis not present

## 2015-09-02 DIAGNOSIS — R296 Repeated falls: Secondary | ICD-10-CM | POA: Diagnosis not present

## 2015-09-02 NOTE — Telephone Encounter (Signed)
Home Health Cert/Plan of Care received (08/09/2015 - 10/10/2015) and placed on MD's desk for signature

## 2015-09-05 ENCOUNTER — Ambulatory Visit: Payer: Medicare Other | Admitting: Internal Medicine

## 2015-09-05 NOTE — Telephone Encounter (Signed)
Paperwork signed, faxed, copy sent to scan 

## 2015-09-08 DIAGNOSIS — R296 Repeated falls: Secondary | ICD-10-CM | POA: Diagnosis not present

## 2015-09-08 DIAGNOSIS — G20 Parkinson's disease: Secondary | ICD-10-CM | POA: Diagnosis not present

## 2015-09-10 ENCOUNTER — Other Ambulatory Visit: Payer: Self-pay | Admitting: Internal Medicine

## 2015-09-12 ENCOUNTER — Other Ambulatory Visit: Payer: Self-pay | Admitting: Internal Medicine

## 2015-09-12 ENCOUNTER — Encounter: Payer: Self-pay | Admitting: Internal Medicine

## 2015-09-12 ENCOUNTER — Telehealth: Payer: Self-pay | Admitting: Internal Medicine

## 2015-09-12 ENCOUNTER — Ambulatory Visit (INDEPENDENT_AMBULATORY_CARE_PROVIDER_SITE_OTHER): Payer: Medicare Other | Admitting: Internal Medicine

## 2015-09-12 ENCOUNTER — Ambulatory Visit (INDEPENDENT_AMBULATORY_CARE_PROVIDER_SITE_OTHER)
Admission: RE | Admit: 2015-09-12 | Discharge: 2015-09-12 | Disposition: A | Discharge status: Home / Self Care | Payer: Medicare Other | Source: Ambulatory Visit | Attending: Internal Medicine | Admitting: Internal Medicine

## 2015-09-12 ENCOUNTER — Other Ambulatory Visit (INDEPENDENT_AMBULATORY_CARE_PROVIDER_SITE_OTHER): Payer: Medicare Other

## 2015-09-12 VITALS — BP 100/60 | HR 55 | Temp 98.4°F | Resp 16 | Ht 59.0 in | Wt 95.0 lb

## 2015-09-12 DIAGNOSIS — G20 Parkinson's disease: Secondary | ICD-10-CM | POA: Diagnosis not present

## 2015-09-12 DIAGNOSIS — E038 Other specified hypothyroidism: Secondary | ICD-10-CM | POA: Diagnosis not present

## 2015-09-12 DIAGNOSIS — R300 Dysuria: Secondary | ICD-10-CM

## 2015-09-12 DIAGNOSIS — S79911A Unspecified injury of right hip, initial encounter: Secondary | ICD-10-CM | POA: Diagnosis not present

## 2015-09-12 DIAGNOSIS — M25551 Pain in right hip: Secondary | ICD-10-CM

## 2015-09-12 DIAGNOSIS — I10 Essential (primary) hypertension: Secondary | ICD-10-CM | POA: Diagnosis not present

## 2015-09-12 DIAGNOSIS — F0390 Unspecified dementia without behavioral disturbance: Secondary | ICD-10-CM

## 2015-09-12 DIAGNOSIS — R296 Repeated falls: Secondary | ICD-10-CM | POA: Diagnosis not present

## 2015-09-12 DIAGNOSIS — E034 Atrophy of thyroid (acquired): Secondary | ICD-10-CM | POA: Diagnosis not present

## 2015-09-12 DIAGNOSIS — F03A0 Unspecified dementia, mild, without behavioral disturbance, psychotic disturbance, mood disturbance, and anxiety: Secondary | ICD-10-CM

## 2015-09-12 DIAGNOSIS — S0990XA Unspecified injury of head, initial encounter: Secondary | ICD-10-CM

## 2015-09-12 LAB — POCT URINALYSIS DIPSTICK
Blood, UA: NEGATIVE
GLUCOSE UA: NEGATIVE
KETONES UA: 5
Leukocytes, UA: NEGATIVE
Nitrite, UA: NEGATIVE
Protein, UA: NEGATIVE
Urobilinogen, UA: 0.2
pH, UA: 6

## 2015-09-12 LAB — URINALYSIS, ROUTINE W REFLEX MICROSCOPIC
BILIRUBIN URINE: NEGATIVE
Hgb urine dipstick: NEGATIVE
KETONES UR: NEGATIVE
LEUKOCYTES UA: NEGATIVE
NITRITE: NEGATIVE
PH: 5.5 (ref 5.0–8.0)
RBC / HPF: NONE SEEN (ref 0–?)
Specific Gravity, Urine: >=1.03 — AB (ref 1.000–1.030)
Total Protein, Urine: NEGATIVE
Urine Glucose: NEGATIVE
Urobilinogen, UA: 0.2 (ref 0.0–1.0)
WBC UA: NONE SEEN (ref 0–?)

## 2015-09-12 LAB — TSH: TSH: 32.47 u[IU]/mL — ABNORMAL HIGH (ref 0.35–4.50)

## 2015-09-12 NOTE — Progress Notes (Signed)
Subjective:  Patient ID: [name], female    DOB: [birth year]  Age: 75 y.o. MRN: [id number]  CC: Head Injury and Hip Pain   HPI [name] presents for follow-up, she is with her daughter today who does not live with her. She has had multiple falls over the last few weeks, she has injured her head twice. She is not taking her medications consistently. She has received OT/PT/HHC over the last few months with no improvement. She has old blood on her right scalp and a bruise over the top/center of her forehead. She complains of soreness in her right hip. She denies neck and back pain. She does not want anything for the pain.  Outpatient Prescriptions Prior to Visit  Medication Sig Dispense Refill  . aspirin 81 MG tablet Take 81 mg by mouth daily.    . carbidopa-levodopa-entacapone (STALEVO) 25-100-200 MG per tablet TAKE 1 TABLET BY MOUTH 4 (FOUR) TIMES DAILY. (Patient taking differently: 2 (two) times daily. TAKE 1 TABLET BY MOUTH 4 (FOUR) TIMES DAILY.) 120 tablet 12  . cholecalciferol (VITAMIN D) 1000 units tablet Take 1 tablet (1,000 Units total) by mouth daily. 100 tablet 3  . levothyroxine (SYNTHROID, LEVOTHROID) 88 MCG tablet TAKE 1 TABLET BY MOUTH EVERY DAY 90 tablet 1  . levothyroxine (SYNTHROID, LEVOTHROID) 50 MCG tablet Take 2 tablets (100 mcg total) by mouth daily before breakfast. 60 tablet 5   No facility-administered medications prior to visit.    ROS Review of Systems  Constitutional: Positive for activity change. Negative for fever, chills, appetite change and unexpected weight change.  HENT: Negative.  Negative for facial swelling, nosebleeds and trouble swallowing.   Eyes: Negative.  Negative for visual disturbance.  Respiratory: Negative.  Negative for cough, choking, chest tightness, shortness of breath and stridor.   Cardiovascular: Negative.  Negative for chest pain, palpitations and leg swelling.  Gastrointestinal: Negative.  Negative for abdominal pain.    Endocrine: Negative.   Genitourinary: Negative.  Negative for difficulty urinating.  Musculoskeletal: Positive for back pain, arthralgias and gait problem. Negative for myalgias, joint swelling and neck pain.  Skin: Positive for wound.  Allergic/Immunologic: Negative.   Neurological: Positive for dizziness, speech difficulty, weakness and light-headedness. Negative for tremors, syncope, facial asymmetry and headaches.  Hematological: Negative.  Negative for adenopathy. Does not bruise/bleed easily.  Psychiatric/Behavioral: Positive for behavioral problems, confusion and decreased concentration. Negative for suicidal ideas, sleep disturbance and dysphoric mood. The patient is not nervous/anxious.     Objective:  BP 100/60 mmHg  Pulse 55  Temp(Src) 98.4 F (36.9 C) (Oral)  Ht 4\' 11"  (1.499 m)  Wt 95 lb (43.092 kg)  BMI 19.18 kg/m2  SpO2 98%  BP Readings from Last 3 Encounters:  09/12/15 100/60  08/05/15 80/60  06/13/15 167/79    Wt Readings from Last 3 Encounters:  09/12/15 95 lb (43.092 kg)  08/05/15 96 lb 12 oz (43.886 kg)  04/18/15 92 lb 8 oz (41.958 kg)    Physical Exam  Constitutional: She is oriented to person, place, and time. She appears well-developed and well-nourished. No distress.  HENT:  Head: Normocephalic. Head is with abrasion and with contusion. Head is without raccoon's eyes, without Battle's sign, without laceration, without right periorbital erythema and without left periorbital erythema.    Mouth/Throat: Oropharynx is clear and moist. No oropharyngeal exudate.  Eyes: Conjunctivae and EOM are normal. Pupils are equal, round, and reactive to light. Right eye exhibits no discharge. Left eye exhibits no discharge. No scleral  icterus.  Neck: Normal range of motion. Neck supple. No JVD present. No tracheal deviation present. No thyromegaly present.  Cardiovascular: Normal rate, regular rhythm and intact distal pulses.  Exam reveals no gallop and no friction  rub.   Murmur heard. Pulmonary/Chest: Effort normal and breath sounds normal. No stridor. No respiratory distress. She has no wheezes. She has no rales. She exhibits no tenderness.  Abdominal: Soft. Bowel sounds are normal. She exhibits no distension and no mass. There is no tenderness. There is no rebound and no guarding.  Musculoskeletal: Normal range of motion. She exhibits no edema or tenderness.       Right hip: Normal. She exhibits normal range of motion, normal strength, no tenderness, no bony tenderness, no swelling and no deformity.       Left hip: Normal. She exhibits normal range of motion, normal strength, no tenderness, no bony tenderness, no swelling and no deformity.       Lumbar back: Normal. She exhibits normal range of motion, no tenderness, no swelling and no deformity.  Lymphadenopathy:    She has no cervical adenopathy.  Neurological: She is oriented to person, place, and time.  Skin: Skin is warm and dry. No rash noted. She is not diaphoretic. No erythema. No pallor.  Psychiatric: Judgment and thought content normal. Her mood appears not anxious. Her affect is not angry. Her speech is delayed and tangential. She is slowed and withdrawn. She is not agitated. Cognition and memory are impaired. She exhibits abnormal recent memory and abnormal remote memory. She is inattentive.    Lab Results  Component Value Date   WBC 3.0* 08/05/2015   HGB 13.2 08/05/2015   HCT 42.7 08/05/2015   PLT 198.0 08/05/2015   GLUCOSE 93 08/05/2015   CHOL 176 09/13/2014   TRIG 111.0 09/13/2014   HDL 61.70 09/13/2014   LDLDIRECT 142.2 10/28/2008   LDLCALC 92 09/13/2014   ALT 6 09/13/2014   AST 20 09/13/2014   NA 142 08/05/2015   K 4.4 08/05/2015   CL 107 08/05/2015   CREATININE 0.99 08/05/2015   BUN 17 08/05/2015   CO2 30 08/05/2015   TSH 32.47* 09/12/2015   INR 1.10 05/19/2012   HGBA1C 5.4 09/13/2014   MICROALBUR <0.7 09/13/2014    Dg Hip Unilat With Pelvis 2-3 Views  Right  09/12/2015  CLINICAL DATA:  Fall.  Injury. EXAM: DG HIP (WITH OR WITHOUT PELVIS) 2-3V RIGHT COMPARISON:  CT 12/23/2012. FINDINGS: Degenerative changes lumbar spine and both hips. No acute bony or joint abnormality identified. Calcific densities in the pelvis are stable consistent with calcifications within previously identified uterine fibroid. Similar findings noted on prior CT of 12/23/2012. Pelvic calcifications secondary to phleboliths also present. IMPRESSION: 1. No acute bony or joint abnormality. Degenerative changes lumbar spine and both hips. 2.  Calcified uterine fibroid. Electronically Signed   By: Marcello Moores  Register   On: 09/12/2015 11:06    Assessment & Plan:   Mckayle was seen today for head injury and hip pain.  Diagnoses and all orders for this visit:  Falls frequently -     Ambulatory referral to Hospice  Parkinson's disease [facility])- I think she and her family would benefit from palliative care so absent a referral to hospice. -     Ambulatory referral to Hospice  Mild dementia -     Ambulatory referral to Hospice  Hypothyroidism due to acquired atrophy of thyroid- her TSH is still elevated due to noncompliance, I recommended that the family allow  hospice to enter her home so that she can get her thyroid medication consistently. -     TSH; Future  Head injury, initial encounter- She is not able to offer a history, at this time I don't see any significant head injury but will order a CT scan to be certain that there is and not an occult skull fracture, subdural hematoma, intracranial hemorrhage. -     CT Head Wo Contrast; Future  Acute right hip pain- exam is unremarkable, plain films are negative for fracture but positive for degenerative joint disease -     Cancel: DG HIP UNILAT WITH PELVIS MIN 4 VIEWS RIGHT; Future  Essential hypertension- her blood pressure is well-controlled, urinalysis is normal. -     Urinalysis, Routine w reflex microscopic (not at [facility]);  Future  Dysuria -     POCT urinalysis dipstick   I am having Ms. Fede maintain her aspirin, carbidopa-levodopa-entacapone, cholecalciferol, and levothyroxine.  No orders of the defined types were placed in this encounter.     Follow-up: Return if symptoms worsen or fail to improve.  Scarlette Calico, MD

## 2015-09-12 NOTE — Telephone Encounter (Signed)
i'm not showing this on patient's med list---please advise, thanks 

## 2015-09-12 NOTE — Telephone Encounter (Signed)
Pt daughter informed of lab results

## 2015-09-12 NOTE — Telephone Encounter (Signed)
The hip x-ray was okay, I don't see a CT scan report yet

## 2015-09-12 NOTE — Progress Notes (Signed)
Pre visit review using our clinic review tool, if applicable. No additional management support is needed unless otherwise documented below in the visit note. 

## 2015-09-12 NOTE — Patient Instructions (Signed)

## 2015-09-12 NOTE — Telephone Encounter (Signed)
Advisement on xray please

## 2015-09-12 NOTE — Telephone Encounter (Signed)
Patients daughter called to follow up on imaging. Advised that no interpretation to the imaging on file at this point. She also asked for a status on the ct referral. Advised outstanding, but i would let him know that you are calling for an update. She states that leaving a detailed vm is ok.

## 2015-09-13 ENCOUNTER — Encounter: Payer: Self-pay | Admitting: Internal Medicine

## 2015-09-13 ENCOUNTER — Ambulatory Visit (INDEPENDENT_AMBULATORY_CARE_PROVIDER_SITE_OTHER)
Admission: RE | Admit: 2015-09-13 | Discharge: 2015-09-13 | Disposition: A | Discharge status: Home / Self Care | Payer: Medicare Other | Source: Ambulatory Visit | Attending: Internal Medicine | Admitting: Internal Medicine

## 2015-09-13 DIAGNOSIS — R296 Repeated falls: Secondary | ICD-10-CM | POA: Diagnosis not present

## 2015-09-13 DIAGNOSIS — S0990XA Unspecified injury of head, initial encounter: Secondary | ICD-10-CM | POA: Diagnosis not present

## 2015-09-13 NOTE — Telephone Encounter (Signed)
Daughter informed. Ct scheduled today at 2

## 2015-09-16 ENCOUNTER — Telehealth: Payer: Self-pay | Admitting: Internal Medicine

## 2015-09-16 NOTE — Telephone Encounter (Signed)
Consulted with another provider in the office due the [place] notes pt had with PCP.  Usual for PCP to take the attending MD role.   Hospice contacted and confirmed that PCP with be the attending MD and Hospice will take over for symptom management.

## 2015-09-16 NOTE — Telephone Encounter (Signed)
Please advise, thanks.

## 2015-09-16 NOTE — Telephone Encounter (Signed)
Evat called from Hospice stated that Dr. Ronnald Ramp refer [name] out to their care. She was wondering if Dr. Ronnald Ramp will at the attending physician. Please call them back

## 2015-09-18 ENCOUNTER — Emergency Department (HOSPITAL_COMMUNITY)
Admission: EM | Admit: 2015-09-18 | Discharge: 2015-09-19 | Disposition: A | Discharge status: Home / Self Care | Payer: Medicare Other | Attending: Emergency Medicine | Admitting: Emergency Medicine

## 2015-09-18 ENCOUNTER — Emergency Department (HOSPITAL_COMMUNITY): Payer: Medicare Other

## 2015-09-18 DIAGNOSIS — Z7982 Long term (current) use of aspirin: Secondary | ICD-10-CM | POA: Diagnosis not present

## 2015-09-18 DIAGNOSIS — E559 Vitamin D deficiency, unspecified: Secondary | ICD-10-CM | POA: Insufficient documentation

## 2015-09-18 DIAGNOSIS — Z79899 Other long term (current) drug therapy: Secondary | ICD-10-CM | POA: Diagnosis not present

## 2015-09-18 DIAGNOSIS — M7989 Other specified soft tissue disorders: Secondary | ICD-10-CM | POA: Diagnosis not present

## 2015-09-18 DIAGNOSIS — R531 Weakness: Secondary | ICD-10-CM | POA: Diagnosis not present

## 2015-09-18 DIAGNOSIS — G20 Parkinson's disease: Secondary | ICD-10-CM | POA: Insufficient documentation

## 2015-09-18 DIAGNOSIS — Y9289 Other specified places as the place of occurrence of the external cause: Secondary | ICD-10-CM | POA: Diagnosis not present

## 2015-09-18 DIAGNOSIS — M1990 Unspecified osteoarthritis, unspecified site: Secondary | ICD-10-CM | POA: Insufficient documentation

## 2015-09-18 DIAGNOSIS — Z8659 Personal history of other mental and behavioral disorders: Secondary | ICD-10-CM | POA: Diagnosis not present

## 2015-09-18 DIAGNOSIS — Y998 Other external cause status: Secondary | ICD-10-CM | POA: Diagnosis not present

## 2015-09-18 DIAGNOSIS — Z853 Personal history of malignant neoplasm of breast: Secondary | ICD-10-CM | POA: Diagnosis not present

## 2015-09-18 DIAGNOSIS — I10 Essential (primary) hypertension: Secondary | ICD-10-CM | POA: Insufficient documentation

## 2015-09-18 DIAGNOSIS — K644 Residual hemorrhoidal skin tags: Secondary | ICD-10-CM | POA: Diagnosis not present

## 2015-09-18 DIAGNOSIS — S8991XA Unspecified injury of right lower leg, initial encounter: Secondary | ICD-10-CM | POA: Insufficient documentation

## 2015-09-18 DIAGNOSIS — R296 Repeated falls: Secondary | ICD-10-CM | POA: Diagnosis not present

## 2015-09-18 DIAGNOSIS — W1839XA Other fall on same level, initial encounter: Secondary | ICD-10-CM | POA: Diagnosis not present

## 2015-09-18 DIAGNOSIS — M25561 Pain in right knee: Secondary | ICD-10-CM | POA: Diagnosis not present

## 2015-09-18 DIAGNOSIS — D649 Anemia, unspecified: Secondary | ICD-10-CM | POA: Insufficient documentation

## 2015-09-18 DIAGNOSIS — M25551 Pain in right hip: Secondary | ICD-10-CM | POA: Diagnosis not present

## 2015-09-18 DIAGNOSIS — T148 Other injury of unspecified body region: Secondary | ICD-10-CM | POA: Insufficient documentation

## 2015-09-18 DIAGNOSIS — T148XXA Other injury of unspecified body region, initial encounter: Secondary | ICD-10-CM

## 2015-09-18 DIAGNOSIS — S79911A Unspecified injury of right hip, initial encounter: Secondary | ICD-10-CM | POA: Diagnosis present

## 2015-09-18 DIAGNOSIS — M8580 Other specified disorders of bone density and structure, unspecified site: Secondary | ICD-10-CM | POA: Insufficient documentation

## 2015-09-18 DIAGNOSIS — S8011XA Contusion of right lower leg, initial encounter: Secondary | ICD-10-CM | POA: Diagnosis not present

## 2015-09-18 DIAGNOSIS — W19XXXA Unspecified fall, initial encounter: Secondary | ICD-10-CM

## 2015-09-18 DIAGNOSIS — Y9389 Activity, other specified: Secondary | ICD-10-CM | POA: Diagnosis not present

## 2015-09-18 DIAGNOSIS — S7001XA Contusion of right hip, initial encounter: Secondary | ICD-10-CM | POA: Diagnosis not present

## 2015-09-18 NOTE — ED Notes (Signed)
Daughter states pt has had multiple falls, states that she was evaluated for the first one but has fallen since and now has R sided hip pain, R leg swelling and bruises on her head. Alert and oriented.

## 2015-09-18 NOTE — ED Provider Notes (Addendum)
CSN: [id number]     Arrival date & time 09/18/15  2015 History  By signing my name below, I, [name], attest that this documentation has been prepared under the direction and in the presence of Kristin Greek, MD. Electronically Signed: Doran [name], ED Scribe. 09/19/2015. 11:12 PM.   Chief Complaint  Patient presents with  . Fall   The history is provided by a relative and the patient. The history is limited by the condition of the patient. No language interpreter was used.   HPI Comments: [name] is a 75 y.o. female who presents to the Emergency Department with a PMHx of HTN, hypothyroid, and parkinson disease. for an evaluation s/p fall 1 day ago. Pts daughter reports multiple falls over the span of one week. Pts most recent fall was last night. She fell on her right side in the kitchen and since then has been complaining of right hip pain and right leg swelling. Daughter reports pain with ambulation and no pain at rest. Pt denies any vomiting, diarrhea, cough, SOB, or any other symptoms at this time.   Daughter reports that pt began falling when she was noncompliant with her medications. She states they are working to get her back on track.  Past Medical History  Diagnosis Date  . Glaucoma   . Abnormal chest x-ray   . Hypertension   . Peripheral vascular disease ([place])   . Venous insufficiency   . Hypercholesteremia   . Hypothyroid   . Diverticulosis of colon   . DJD (degenerative joint disease)   . Hip pain   . Lumbar back pain   . Osteopenia   . Vitamin D deficiency   . Parkinson disease ([place])   . Anxiety   . Breast cancer ([place])     stage 0 left   Past Surgical History  Procedure Laterality Date  . Ovarian cyst removal    . Lapraoscopic cholecystectomy  09/2003    Dr. Dalbert Batman  . Lumbar laminectomy and foraminotomies for spinal stenosis  11/2005    Dr. Gladstone Lighter  . Cataract surgery and argon laser trabeculoplasty  04/2010    Dr. Herbert Deaner  . Colonoscopy     . Polypectomy    . Cholecystectomy    . Breast lumpectomy  8/11 by DrWakefield  . Incise and drain abcess  10/04/11    sebaceous cyst on back  . Lumbar laminectomy/decompression microdiscectomy  05/20/2012    Procedure: LUMBAR LAMINECTOMY/DECOMPRESSION MICRODISCECTOMY 2 LEVELS;  Surgeon: Tobi Bastos, MD;  Location: [facility]S;  Service: Orthopedics;  Laterality: N/A;   Family History  Problem Relation Age of Onset  . Colon cancer Father   . Osteoporosis Neg Hx    Social History  Substance Use Topics  . Smoking status: Never Smoker   . Smokeless tobacco: Never Used  . Alcohol Use: No   OB History    No data available     Review of Systems  Respiratory: Negative for cough and shortness of breath.   Gastrointestinal: Negative for nausea and vomiting.  Musculoskeletal: Positive for arthralgias.  All other systems reviewed and are negative.  Allergies  Review of patient's allergies indicates no known allergies.  Home Medications   Prior to Admission medications   Medication Sig Start Date End Date Taking? Authorizing Provider  aspirin EC 81 MG tablet Take 81 mg by mouth daily.   Yes Historical Provider, MD  carbidopa-levodopa-entacapone (STALEVO) 25-100-200 MG per tablet TAKE 1 TABLET BY MOUTH 4 (FOUR) TIMES DAILY.  Patient taking differently: Take 1 tablet by mouth 2 (two) times daily.  11/16/14  Yes Penni Bombard, MD  cholecalciferol (VITAMIN D) 1000 units tablet Take 1 tablet (1,000 Units total) by mouth daily. Patient taking differently: Take 2,000 Units by mouth daily.  08/05/15 08/04/16 Yes Aleksei Plotnikov V, MD  levothyroxine (SYNTHROID, LEVOTHROID) 88 MCG tablet TAKE 1 TABLET BY MOUTH EVERY DAY 08/29/15  Yes Janith Lima, MD  KLOR-CON M20 20 MEQ tablet TAKE 1 TABLET (20 MEQ TOTAL) BY MOUTH 3 (THREE) TIMES DAILY. Patient not taking: Reported on 09/18/2015 09/12/15   Janith Lima, MD   BP 110/57 mmHg  Pulse 60  Temp(Src) 98 F (36.7 C) (Oral)  Resp 19  SpO2  97% Physical Exam  Constitutional: She appears well-developed and well-nourished. No distress.  HENT:  Head: Normocephalic and atraumatic.  Right Ear: Hearing normal.  Left Ear: Hearing normal.  Nose: Nose normal.  Mouth/Throat: Oropharynx is clear and moist and mucous membranes are normal.  Eyes: Conjunctivae and EOM are normal. Pupils are equal, round, and reactive to light.  Neck: Normal range of motion. Neck supple.  Cardiovascular: Regular rhythm, S1 normal and S2 normal.  Exam reveals no gallop and no friction rub.   No murmur heard. Pulmonary/Chest: Effort normal and breath sounds normal. No respiratory distress. She exhibits no tenderness.  Abdominal: Soft. Normal appearance and bowel sounds are normal. There is no hepatosplenomegaly. There is no tenderness. There is no rebound, no guarding, no tenderness at McBurney's point and negative Murphy's sign. No hernia.  Genitourinary: Rectal exam shows external hemorrhoid. Rectal exam shows no mass, no tenderness and anal tone normal.  Musculoskeletal: Normal range of motion.  Diffuse swelling of the right leg from the thigh to the foot; no redness or warmth of the skin; normal ROM of the hip knee and ankle  Neurological: She is alert. She has normal strength. No cranial nerve deficit or sensory deficit. Coordination normal. GCS eye subscore is 4. GCS verbal subscore is 5. GCS motor subscore is 6.  Skin: Skin is warm, dry and intact. No rash noted. No cyanosis.  Psychiatric: She has a normal mood and affect. Her speech is normal and behavior is normal. Thought content normal.  Nursing note and vitals reviewed.   ED Course  Procedures   DIAGNOSTIC STUDIES: Oxygen Saturation is 98% on room air, normal by my interpretation.    COORDINATION OF CARE: 11:08 PM Will order blood work, urinalysis, and xray of the right hip, femur, tibia and fibula Discussed treatment plan with pt and pts family at bedside and they agreed to plan.  Labs  Review Labs Reviewed  CBC WITH DIFFERENTIAL/PLATELET - Abnormal; Notable for the following:    RBC 2.86 (*)    Hemoglobin 8.9 (*)    HCT 27.9 (*)    All other components within normal limits  BASIC METABOLIC PANEL - Abnormal; Notable for the following:    Sodium 146 (*)    Glucose, Bld 114 (*)    All other components within normal limits  URINALYSIS, ROUTINE W REFLEX MICROSCOPIC (NOT AT [facility]) - Abnormal; Notable for the following:    Color, Urine ORANGE (*)    Leukocytes, UA TRACE (*)    All other components within normal limits  RETICULOCYTES - Abnormal; Notable for the following:    Retic Ct Pct 3.7 (*)    RBC. 2.81 (*)    All other components within normal limits  URINE MICROSCOPIC-ADD ON - Abnormal; Notable  for the following:    Squamous Epithelial / LPF 0-5 (*)    Bacteria, UA FEW (*)    Crystals CA OXALATE CRYSTALS (*)    All other components within normal limits  VITAMIN B12  FOLATE  IRON AND TIBC  FERRITIN  POC OCCULT BLOOD, ED    Imaging Review Dg Tibia/fibula Right  09/19/2015  CLINICAL DATA:  75 year old female with fall and right knee pain. EXAM: RIGHT TIBIA AND FIBULA - 2 VIEW COMPARISON:  None. FINDINGS: There is no definite acute fracture or dislocation. Small linear calcific density adjacent to the medial femoral epicondyle may be chronic. A minimally displaced acute cortical fracture is less likely. The bones are osteopenic. There is osteoarthritic changes of the knee joint with narrowing of the medial and lateral compartment. The soft tissues appear unremarkable. IMPRESSION: No definite acute fracture or dislocation. Electronically Signed   By: Anner Crete M.D.   On: 09/19/2015 00:34   Dg Hip Unilat With Pelvis 2-3 Views Right  09/19/2015  CLINICAL DATA:  75 year old female with fall and right hip pain. EXAM: DG HIP (WITH OR WITHOUT PELVIS) 2-3V RIGHT COMPARISON:  Radiograph dated 09/12/2015 FINDINGS: There is no acute fracture or dislocation. The bones are  mildly osteopenic. There is mild osteoarthritic changes of the hips bilaterally. Soft tissues appear unremarkable. Amorphous calcification or the pelvis most compatible with calcified fibroid. IMPRESSION: No acute fracture or dislocation. Electronically Signed   By: Anner Crete M.D.   On: 09/19/2015 00:32   Dg Femur, Min 2 Views Right  09/19/2015  CLINICAL DATA:  Multiple recent falls, with right leg swelling and right hip pain. Initial encounter. EXAM: RIGHT FEMUR 2 VIEWS COMPARISON:  None. FINDINGS: There is no evidence of fracture or dislocation. The right femur appears intact. The right knee joint demonstrates mild medial compartment narrowing and marginal osteophytes at the medial compartment. The right femoral head remains seated at the acetabulum. No soft tissue abnormalities are characterized on radiograph. No knee joint effusion is seen. IMPRESSION: No evidence of fracture or dislocation. Electronically Signed   By: Garald Balding M.D.   On: 09/19/2015 00:32   I have personally reviewed and evaluated these images and lab results as part of my medical decision-making.  MDM   Final diagnoses:  None  Weakness with frequent falls Anemia  Patient presents to the emergency department for evaluation after a fall. Patient has had multiple falls this past week. Patient fell while walking in the kitchen today and has been complaining of pain in her right leg and hip area since the fall. Patient has had swelling of her right leg since a fall earlier in the week. Examination reveals diffuse and significant swelling of the entire leg. This is concerning for possible DVT. X-rays have been unremarkable. Lab work performed reveals acute anemia. Patient's hemoglobin on November 10 was 13.2. Today it is 8.9. She has not noticed any bleeding. She does not have any significant hematuria. Rectal exam today was heme-negative. Cause of the anemia is unclear at this time.  04:50 discussed with Dr. Hal Hope.  Does not advise admission at this time. Recommend repeating hemoglobin year in the ER and obtain venous duplex while in the ER this morning.  I personally performed the services described in this documentation, which was scribed in my presence. The recorded information has been reviewed and is accurate.    Kristin Greek, MD 09/19/15 [place], MD 09/19/15 [id number]

## 2015-09-19 ENCOUNTER — Emergency Department (HOSPITAL_COMMUNITY): Payer: Medicare Other

## 2015-09-19 ENCOUNTER — Telehealth: Payer: Self-pay | Admitting: Internal Medicine

## 2015-09-19 ENCOUNTER — Emergency Department (HOSPITAL_BASED_OUTPATIENT_CLINIC_OR_DEPARTMENT_OTHER)
Admit: 2015-09-19 | Discharge: 2015-09-19 | Disposition: A | Discharge status: Home / Self Care | Payer: Medicare Other | Attending: Emergency Medicine | Admitting: Emergency Medicine

## 2015-09-19 DIAGNOSIS — M25561 Pain in right knee: Secondary | ICD-10-CM | POA: Diagnosis not present

## 2015-09-19 DIAGNOSIS — S8991XA Unspecified injury of right lower leg, initial encounter: Secondary | ICD-10-CM | POA: Diagnosis not present

## 2015-09-19 DIAGNOSIS — S79911A Unspecified injury of right hip, initial encounter: Secondary | ICD-10-CM | POA: Diagnosis not present

## 2015-09-19 DIAGNOSIS — S7001XA Contusion of right hip, initial encounter: Secondary | ICD-10-CM | POA: Diagnosis not present

## 2015-09-19 DIAGNOSIS — M25551 Pain in right hip: Secondary | ICD-10-CM | POA: Diagnosis not present

## 2015-09-19 DIAGNOSIS — T148 Other injury of unspecified body region: Secondary | ICD-10-CM | POA: Diagnosis not present

## 2015-09-19 DIAGNOSIS — M7989 Other specified soft tissue disorders: Secondary | ICD-10-CM | POA: Diagnosis not present

## 2015-09-19 LAB — CBC WITH DIFFERENTIAL/PLATELET
Basophils Absolute: 0 10*3/uL (ref 0.0–0.1)
Basophils Relative: 0 %
Eosinophils Absolute: 0.1 10*3/uL (ref 0.0–0.7)
Eosinophils Relative: 2 %
HCT: 27.9 % — ABNORMAL LOW (ref 36.0–46.0)
Hemoglobin: 8.9 g/dL — ABNORMAL LOW (ref 12.0–15.0)
Lymphocytes Relative: 26 %
Lymphs Abs: 1.3 10*3/uL (ref 0.7–4.0)
MCH: 31.1 pg (ref 26.0–34.0)
MCHC: 31.9 g/dL (ref 30.0–36.0)
MCV: 97.6 fL (ref 78.0–100.0)
Monocytes Absolute: 0.3 10*3/uL (ref 0.1–1.0)
Monocytes Relative: 7 %
Neutro Abs: 3.2 10*3/uL (ref 1.7–7.7)
Neutrophils Relative %: 65 %
Platelets: 244 10*3/uL (ref 150–400)
RBC: 2.86 MIL/uL — ABNORMAL LOW (ref 3.87–5.11)
RDW: 14.3 % (ref 11.5–15.5)
WBC: 4.9 10*3/uL (ref 4.0–10.5)

## 2015-09-19 LAB — IRON AND TIBC
IRON: 62 ug/dL (ref 28–170)
Saturation Ratios: 25 % (ref 10.4–31.8)
TIBC: 249 ug/dL — ABNORMAL LOW (ref 250–450)
UIBC: 187 ug/dL

## 2015-09-19 LAB — URINE MICROSCOPIC-ADD ON

## 2015-09-19 LAB — CBC
HCT: 28.7 % — ABNORMAL LOW (ref 36.0–46.0)
HEMOGLOBIN: 9.2 g/dL — AB (ref 12.0–15.0)
MCH: 31.2 pg (ref 26.0–34.0)
MCHC: 32.1 g/dL (ref 30.0–36.0)
MCV: 97.3 fL (ref 78.0–100.0)
Platelets: 233 10*3/uL (ref 150–400)
RBC: 2.95 MIL/uL — AB (ref 3.87–5.11)
RDW: 14.4 % (ref 11.5–15.5)
WBC: 4.5 10*3/uL (ref 4.0–10.5)

## 2015-09-19 LAB — BASIC METABOLIC PANEL
Anion gap: 6 (ref 5–15)
BUN: 20 mg/dL (ref 6–20)
CO2: 30 mmol/L (ref 22–32)
Calcium: 9 mg/dL (ref 8.9–10.3)
Chloride: 110 mmol/L (ref 101–111)
Creatinine, Ser: 0.76 mg/dL (ref 0.44–1.00)
GFR calc Af Amer: >60 mL/min (ref >60)
GFR calc non Af Amer: >60 mL/min (ref >60)
Glucose, Bld: 114 mg/dL — ABNORMAL HIGH (ref 65–99)
Potassium: 3.6 mmol/L (ref 3.5–5.1)
Sodium: 146 mmol/L — ABNORMAL HIGH (ref 135–145)

## 2015-09-19 LAB — APTT: APTT: 32 s (ref 24–37)

## 2015-09-19 LAB — URINALYSIS, ROUTINE W REFLEX MICROSCOPIC
Bilirubin Urine: NEGATIVE
Glucose, UA: NEGATIVE mg/dL
Hgb urine dipstick: NEGATIVE
Ketones, ur: NEGATIVE mg/dL
Nitrite: NEGATIVE
Protein, ur: NEGATIVE mg/dL
Specific Gravity, Urine: 1.027 (ref 1.005–1.030)
pH: 6 (ref 5.0–8.0)

## 2015-09-19 LAB — RETICULOCYTES
RBC.: 2.81 MIL/uL — ABNORMAL LOW (ref 3.87–5.11)
RETIC COUNT ABSOLUTE: 104 10*3/uL (ref 19.0–186.0)
Retic Ct Pct: 3.7 % — ABNORMAL HIGH (ref 0.4–3.1)

## 2015-09-19 LAB — POC OCCULT BLOOD, ED: Fecal Occult Bld: NEGATIVE

## 2015-09-19 LAB — PROTIME-INR
INR: 1.18 (ref 0.00–1.49)
PROTHROMBIN TIME: 14.8 s (ref 11.6–15.2)

## 2015-09-19 LAB — FERRITIN: FERRITIN: 103 ng/mL (ref 11–307)

## 2015-09-19 LAB — FOLATE: Folate: 12 ng/mL (ref >5.9)

## 2015-09-19 LAB — VITAMIN B12: Vitamin B-12: 335 pg/mL (ref 180–914)

## 2015-09-19 NOTE — Telephone Encounter (Signed)
Amy from [facility] called regarding the referral that was put in. She tried to call pt a couple times on Friday and then again this morning. So she called the daughter and she doesn't think pt needs care from them and she did offer pallative care but she still refused. She will let us know when she's ready.

## 2015-09-19 NOTE — ED Notes (Signed)
Two unsuccessful IV attempts by this nurse

## 2015-09-19 NOTE — Progress Notes (Signed)
VASCULAR LAB PRELIMINARY  PRELIMINARY  PRELIMINARY  PRELIMINARY  Right lower extremity venous duplex completed.      Right:  No evidence of DVT, superficial thrombosis, or Baker's cyst.   Janifer Adie, RVT, RDMS 09/19/2015, 9:24 AM

## 2015-09-19 NOTE — Progress Notes (Signed)
CSW staffed with nurse. CSW spoke with patient at bedside. Patient spoke very lowly and at times CSW could not understand what patient was saying. Patient reports she had a slight fall and she lives at home with her husband. Patient did not want to provide husbands name. Patient reports she falls often. Patient reports she has family in [place], however, patient states she does not want to list any family. Patient reports she has a family member and someone from an agency to come into the home and help with bathing, dressing, and cleaning. No questions noted for CSW at this time.  Genice Rouge [id number] ED CSW 09/19/2015 8:43 AM

## 2015-09-19 NOTE — ED Notes (Signed)
Delay in blood draw, pt in radiology

## 2015-11-15 ENCOUNTER — Telehealth: Payer: Self-pay | Admitting: *Deleted

## 2015-11-15 ENCOUNTER — Ambulatory Visit: Payer: Medicare Other | Admitting: Diagnostic Neuroimaging

## 2015-11-15 NOTE — Telephone Encounter (Signed)
Message For: [facility]                  Taken 23-MAY-17 at  8:12AM by JPO ------------------------------------------------------------ Charmian Muff Statz              CID [id number]  Patient [name]          Pt's Dr [facility]    Area Code 336 Phone# [phone] [place] [phone]     RE PCB TO LET KNOW APPT TIME/                                                                             Disp:Y/N N If Y = C/B If No Response In 96minutes ============================================================ Cxl appt. ds

## 2015-11-16 ENCOUNTER — Encounter: Payer: Self-pay | Admitting: Diagnostic Neuroimaging

## 2015-11-19 ENCOUNTER — Telehealth: Payer: Self-pay | Admitting: Nurse Practitioner

## 2015-11-19 NOTE — Telephone Encounter (Signed)
Spoke with both patient dtr and husband re 6/6 LTS visit.

## 2015-11-24 ENCOUNTER — Telehealth: Payer: Self-pay | Admitting: Nurse Practitioner

## 2015-11-24 NOTE — Telephone Encounter (Signed)
Per 11/23/15 staff message from [facility] 11/29/15 LTS visit will need to be rescheduled due to a schedule change and late arrival on 11/29/15. Moved LTS visit from 11/29/15 to 12/06/15 @ 8:30 am. Spoke with patient husband today re cancellation for 11/29/15 and new date/time for 12/06/15 @ 8:30 am.

## 2015-11-25 ENCOUNTER — Other Ambulatory Visit: Payer: Self-pay | Admitting: Internal Medicine

## 2015-11-25 ENCOUNTER — Other Ambulatory Visit: Payer: Self-pay | Admitting: Diagnostic Neuroimaging

## 2015-11-29 ENCOUNTER — Encounter: Payer: Medicare Other | Admitting: Nurse Practitioner

## 2015-12-02 ENCOUNTER — Other Ambulatory Visit: Payer: Self-pay | Admitting: Internal Medicine

## 2015-12-06 ENCOUNTER — Telehealth: Payer: Self-pay | Admitting: Nurse Practitioner

## 2015-12-06 ENCOUNTER — Ambulatory Visit (HOSPITAL_BASED_OUTPATIENT_CLINIC_OR_DEPARTMENT_OTHER): Payer: Medicare Other | Admitting: Nurse Practitioner

## 2015-12-06 ENCOUNTER — Encounter: Payer: Self-pay | Admitting: Nurse Practitioner

## 2015-12-06 VITALS — BP 144/68 | HR 59 | Temp 97.7°F | Resp 18 | Ht 59.0 in | Wt 87.2 lb

## 2015-12-06 DIAGNOSIS — F0390 Unspecified dementia without behavioral disturbance: Secondary | ICD-10-CM

## 2015-12-06 DIAGNOSIS — M1990 Unspecified osteoarthritis, unspecified site: Secondary | ICD-10-CM | POA: Diagnosis not present

## 2015-12-06 DIAGNOSIS — M8580 Other specified disorders of bone density and structure, unspecified site: Secondary | ICD-10-CM | POA: Diagnosis not present

## 2015-12-06 DIAGNOSIS — R634 Abnormal weight loss: Secondary | ICD-10-CM

## 2015-12-06 DIAGNOSIS — Z853 Personal history of malignant neoplasm of breast: Secondary | ICD-10-CM

## 2015-12-06 DIAGNOSIS — M25551 Pain in right hip: Secondary | ICD-10-CM | POA: Diagnosis not present

## 2015-12-06 DIAGNOSIS — C50912 Malignant neoplasm of unspecified site of left female breast: Secondary | ICD-10-CM

## 2015-12-06 NOTE — Patient Instructions (Signed)
Thank you for coming in today!  As we discussed, please continue to perform your self breast exam and report any changes. If you note any new symptoms (please see below), be sure to notify us ASAP.  Your mammogram will be due in September 2017 and we will enter orders for it today.  We'll have you return in one year's time for your next appointment or sooner if you have any problems. Please be sure to stop by scheduling on your way out to make those appointment(s).  Looking forward to working with you in the future!  Let us know if you have any questions!  Symptoms to Watch for and Report to Your Provider  . Return of the cancer symptoms you had before- such as a lump or new growth where your cancer first started . New or unusual pain that seems unrelated to an injury and does not go away, including back pain or bone pain . Weight loss without trying/intending . Unexplained bleeding . A rash or allergic reaction, such as swelling, severe itching or wheezing . Chills or fevers . Persistent headaches . Shortness of breath or difficulty breathing . Bloody stools or blood in your urine . Lumps, bumps, swelling and/or nipple discharge . Nausea, vomiting, diarrhea, loss of appetite, or trouble swallowing . A cough that doesn't go away . Abdominal pain . Swelling in your arms or legs . Fractures . Hot flashes or other menopausal symptoms . Any other signs mentioned by your doctor or nurse or any unusual symptoms                 that you just can't explain   NOTE: Just because you have certain symptoms, it doesn't mean the cancer has come back or you have a new cancer. Symptoms can be due to other problems that need to be addressed.  It is important to watch for these symptoms and report them to your provider so you can be medically evaluated for any of these concerns!     Living a Life of Wellness After Cancer:  *Note: Please consult your health care provider before using any medications,  supplements, over-the-counter products, or other interventions.  Also, please consult your primary care provider before you begin any lifestyle program (diet, exercise, etc.).  Your safety is our top priority and we want to make sure you continue to live a long and healthy life!    Healthy Lifestyle Recommendations  As a cancer survivor, it is important develop a lifelong commitment to a healthy lifestyle. A healthy lifestyle can prevent cancer from returning as well as prevent other diseases like heart disease, diabetes and high blood pressure.  These are some things that you can do to have a healthy lifestyle:  Marland Kitchen Maintain a healthy weight.  . Exercise daily per your doctor's orders. . Eat a balanced diet high in fruits, vegetables, bran, and fiber. Limit intake of red meat      and processed foods.  . Limit how much alcohol you consume, if at all. Ali Lowe regular bone mineral density testing for osteoporosis.  . Talk to your doctor about cardiovascular disease or "heart disease" screening. . Stop smoking (if you smoke). . Know your family history. . Be mindful of your emotional, social, and spiritual needs. . Meet regularly with a Primary Care Provider (PCP). Find a PCP if you do not             already have one. . Talk to your doctor  about regular cancer screening including screening for colon           cancer, GYN cancers, and skin cancer.

## 2015-12-06 NOTE — Progress Notes (Signed)
CLINIC:  Cancer Survivorship   REASON FOR VISIT:  Routine follow-up post-treatment for history of breast cancer.  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 Erler presents to the [facility] today for ongoing follow up regarding her history of breast cancer. Overall, Ms. Tant reports feeling fairly well since her last visit with Dr. Lindi Adie in April 2016.  She has unfortunately experienced several falls necessitating visits to the ED, with the most recent one being in March 2017.  At that time, she fell and struck her head and right hip.  Imaging has been negative.  She has continued with hip pain which is slowly improving. She also has some back pain, which is ongoing and no worse.   She has not noticed any change within her breast and her last mammogram was in September 2016 and unremarkable.  She continues to see her PCP and neurologist regularly and sees Dr. Leta Baptist later this month regarding her parkinson's and dementia.  She denies any headache, cough, or shortness of breath. She reports a good appetite and denies any weight loss.  However, her weight has fluctuated over the last few months and she is down again from earlier this year. She has a nodule under her right arm that her duaghter noticed about a month ago.  Upon questioning, it is non-tender.    REVIEW OF SYSTEMS:  General: Generalized weakness and unsteadiness related to Parkinson's.  Denies fever, chills, unintentional weight loss, or generalized fatigue.  HEENT: Runny nose. Denies visual changes, hearing loss, mouth sores, or difficulty swallowing. Cardiac: Denies palpitations and lower extremity edema.  Respiratory: Denies wheeze or dyspnea on  exertion.  Breast: As above. GI: Denies abdominal pain, constipation, diarrhea, nausea, or vomiting.  GU: Incontinence.  Denies dysuria, hematuria, vaginal bleeding, vaginal discharge, or vaginal dryness.  Musculoskeletal: As above. Neuro: As above. Skin: Denies rash, pruritis, or open wounds.  Psych: Dementia.  Denies depression, anxiety, or insomnia.  A 14-point review of systems was completed and was negative, except as noted above.   ONCOLOGY TREATMENT TEAM:  1. Surgeon:  Dr. Donne Hazel at [facility]  2. Medical Oncologist: Dr. Lindi Adie / [facility] Rolls 3. Radiation Oncologist: Dr. Lisbeth Renshaw    PAST MEDICAL/SURGICAL HISTORY:  Past Medical History  Diagnosis Date  . Glaucoma   . Abnormal chest x-ray   . Hypertension   . Peripheral vascular disease ([place])   . Venous insufficiency   . Hypercholesteremia   . Hypothyroid   . Diverticulosis of colon   . DJD (degenerative joint disease)   . Hip pain   . Lumbar back pain   . Osteopenia   . Vitamin D deficiency   . Parkinson disease ([place])   . Anxiety   . Breast cancer ([place])     stage 0 left   Past Surgical History  Procedure Laterality Date  . Ovarian cyst removal    . Lapraoscopic cholecystectomy  09/2003    Dr. Dalbert Batman  . Lumbar laminectomy and foraminotomies for spinal stenosis  11/2005    Dr. Gladstone Lighter  . Cataract surgery and argon laser trabeculoplasty  04/2010    Dr. Herbert Deaner  . Colonoscopy    . Polypectomy    . Cholecystectomy    .  Breast lumpectomy  8/11 by Dr[place]akefield  . Incise and drain abcess  10/04/11    sebaceous cyst on back  . Lumbar laminectomy/decompression microdiscectomy  05/20/2012    Procedure: LUMBAR LAMINECTOMY/DECOMPRESSION MICRODISCECTOMY 2 LEVELS;  Surgeon: Tobi Bastos, MD;  Location: [facility];  Service: Orthopedics;  Laterality: N/A;     ALLERGIES:  No Known Allergies   CURRENT MEDICATIONS:  Current Outpatient Prescriptions on File Prior to Visit  Medication Sig Dispense Refill  .  carbidopa-levodopa-entacapone (STALEVO) 25-100-200 MG tablet TAKE 1 TABLET BY MOUTH 4 (FOUR) TIMES DAILY. 120 tablet 0  . cholecalciferol (VITAMIN D) 1000 units tablet Take 1 tablet (1,000 Units total) by mouth daily. (Patient taking differently: Take 2,000 Units by mouth daily. ) 100 tablet 3  . KLOR-CON M20 20 MEQ tablet TAKE 1 TABLET (20 MEQ TOTAL) BY MOUTH 3 (THREE) TIMES DAILY. 90 tablet 1  . levothyroxine (SYNTHROID, LEVOTHROID) 88 MCG tablet TAKE 1 TABLET BY MOUTH EVERY DAY 90 tablet 1  . aspirin EC 81 MG tablet Take 81 mg by mouth daily. Reported on 12/06/2015     No current facility-administered medications on file prior to visit.     ONCOLOGIC FAMILY HISTORY:  Family History  Problem Relation Age of Onset  . Colon cancer Father   . Osteoporosis Neg Hx      SOCIAL HISTORY:  Jolan Mealor is married and lives with her family in [place], [place].  She has 1 child, her daughter who accompanies her today. Ms. Riendeau is currently retired.  She denies any current or history of tobacco, alcohol, or illicit drug use.     PHYSICAL EXAMINATION:  Vital Signs: Filed Vitals:   12/06/15 0848  BP: 144/68  Pulse: 59  Temp: 97.7 F (36.5 C)  Resp: 18   Weigh: 87.2 (down 0.8 pounds since 09/2014) ECOG performance status: 2 General: Well-nourished, well-appearing female in no acute distress.  She is  accompanied in clinic by her husband and daughter today.   HEENT: Head is atraumatic and normocephalic.  Pupils equal and reactive to light and accomodation. Conjunctivae clear without exudate.  Sclerae anicteric. Oral mucosa is pink, moist, and intact without lesions.  Oropharynx is pink without lesions or erythema.  Lymph: No cervical, supraclavicular, infraclavicular, or axillary lymphadenopathy noted on palpation. She has a small underarm cyst along her right axilla measuring approximately 0.5 cm in diameter that is non erythematous and dry.  No drainage expressed. Cardiovascular:  Regular rate and rhythm without murmurs, rubs, or gallops. Respiratory: Clear to auscultation bilaterally. Chest expansion symmetric without accessory muscle use on inspiration or expiration.  Breast: Bilateral breast exam performed. Bilateral breasts fatty replaced with no mass or lesion. GI: Abdomen soft and round. No tenderness to palpation. Bowel sounds normoactive in 4 quadrants. GU: Deferred.  Musculoskeletal: Muscle strength 5/5 in all extremities.   Neuro: No focal deficits. Unsteady gait.  Walks with support closeby from daughter. Psych: Mood and affect normal and appropriate for situation.  Extremities: No edema, cyanosis, or clubbing.  Skin: Warm and dry. No open lesions noted.   LABORATORY DATA:  Recent Results (from the past 2160 hour(s))  POCT urinalysis dipstick     Status: None   Collection Time: 09/12/15  9:59 AM  Result Value Ref Range   Color, UA red    Clarity, UA clear    Glucose, UA neg    Bilirubin, UA 1+    Ketones, UA 5    Spec Grav, UA >=1.030  Blood, UA neg    pH, UA 6.0    Protein, UA neg    Urobilinogen, UA 0.2    Nitrite, UA neg    Leukocytes, UA Negative Negative  TSH     Status: Abnormal   Collection Time: 09/12/15 10:04 AM  Result Value Ref Range   TSH 32.47 (H) 0.35 - 4.50 uIU/mL  Urinalysis, Routine w reflex microscopic (not at [facility])     Status: Abnormal   Collection Time: 09/12/15 10:04 AM  Result Value Ref Range   Color, Urine YELLOW Yellow;Lt. Yellow   APPearance CLEAR Clear   Specific Gravity, Urine >=1.030 (A) 1.000 - 1.030   pH 5.5 5.0 - 8.0   Total Protein, Urine NEGATIVE Negative   Urine Glucose NEGATIVE Negative   Ketones, ur NEGATIVE Negative   Bilirubin Urine NEGATIVE Negative   Hgb urine dipstick NEGATIVE Negative   Urobilinogen, UA 0.2 0.0 - 1.0   Leukocytes, UA NEGATIVE Negative   Nitrite NEGATIVE Negative   WBC, UA none seen 0-2/hpf   RBC / HPF none seen 0-2/hpf   Squamous Epithelial / LPF Rare(0-4/hpf) Rare(0-4/hpf)    Ca Oxalate Crys, UA Presence of (A) None  CBC with Differential/Platelet     Status: Abnormal   Collection Time: 09/19/15 12:20 AM  Result Value Ref Range   WBC 4.9 4.0 - 10.5 K/uL   RBC 2.86 (L) 3.87 - 5.11 MIL/uL   Hemoglobin 8.9 (L) 12.0 - 15.0 g/dL   HCT 27.9 (L) 36.0 - 46.0 %   MCV 97.6 78.0 - 100.0 fL   MCH 31.1 26.0 - 34.0 pg   MCHC 31.9 30.0 - 36.0 g/dL   RDW 14.3 11.5 - 15.5 %   Platelets 244 150 - 400 K/uL   Neutrophils Relative % 65 %   Neutro Abs 3.2 1.7 - 7.7 K/uL   Lymphocytes Relative 26 %   Lymphs Abs 1.3 0.7 - 4.0 K/uL   Monocytes Relative 7 %   Monocytes Absolute 0.3 0.1 - 1.0 K/uL   Eosinophils Relative 2 %   Eosinophils Absolute 0.1 0.0 - 0.7 K/uL   Basophils Relative 0 %   Basophils Absolute 0.0 0.0 - 0.1 K/uL  Basic metabolic panel     Status: Abnormal   Collection Time: 09/19/15 12:20 AM  Result Value Ref Range   Sodium 146 (H) 135 - 145 mmol/L   Potassium 3.6 3.5 - 5.1 mmol/L   Chloride 110 101 - 111 mmol/L   CO2 30 22 - 32 mmol/L   Glucose, Bld 114 (H) 65 - 99 mg/dL   BUN 20 6 - 20 mg/dL   Creatinine, Ser 0.76 0.44 - 1.00 mg/dL   Calcium 9.0 8.9 - 10.3 mg/dL   GFR calc non Af Amer >60 >60 mL/min   GFR calc Af Amer >60 >60 mL/min    Comment: (NOTE) The eGFR has been calculated using the CKD EPI equation. This calculation has not been validated in all clinical situations. eGFR's persistently <60 mL/min signify possible Chronic Kidney Disease.    Anion gap 6 5 - 15  Urinalysis, Routine w reflex microscopic (not at [facility])     Status: Abnormal   Collection Time: 09/19/15  2:26 AM  Result Value Ref Range   Color, Urine ORANGE (A) YELLOW    Comment: BIOCHEMICALS MAY BE AFFECTED BY COLOR   APPearance CLEAR CLEAR   Specific Gravity, Urine 1.027 1.005 - 1.030   pH 6.0 5.0 - 8.0   Glucose, UA NEGATIVE  NEGATIVE mg/dL   Hgb urine dipstick NEGATIVE NEGATIVE   Bilirubin Urine NEGATIVE NEGATIVE   Ketones, ur NEGATIVE NEGATIVE mg/dL   Protein, ur  NEGATIVE NEGATIVE mg/dL   Nitrite NEGATIVE NEGATIVE   Leukocytes, UA TRACE (A) NEGATIVE  Urine microscopic-add on     Status: Abnormal   Collection Time: 09/19/15  2:26 AM  Result Value Ref Range   Squamous Epithelial / LPF 0-5 (A) NONE SEEN   WBC, UA 0-5 0 - 5 WBC/hpf   RBC / HPF 0-5 0 - 5 RBC/hpf   Bacteria, UA FEW (A) NONE SEEN   Crystals CA OXALATE CRYSTALS (A) NEGATIVE  POC occult blood, ED Provider will collect     Status: None   Collection Time: 09/19/15  2:45 AM  Result Value Ref Range   Fecal Occult Bld NEGATIVE NEGATIVE  Vitamin B12     Status: None   Collection Time: 09/19/15  3:41 AM  Result Value Ref Range   Vitamin B-12 335 180 - 914 pg/mL    Comment: (NOTE) This assay is not validated for testing neonatal or myeloproliferative syndrome specimens for Vitamin B12 levels. Performed at [facility]   Folate     Status: None   Collection Time: 09/19/15  3:41 AM  Result Value Ref Range   Folate 12.0 >5.9 ng/mL    Comment: Performed at [facility]  Iron and TIBC     Status: Abnormal   Collection Time: 09/19/15  3:41 AM  Result Value Ref Range   Iron 62 28 - 170 ug/dL   TIBC 249 (L) 250 - 450 ug/dL   Saturation Ratios 25 10.4 - 31.8 %   UIBC 187 ug/dL    Comment: Performed at [facility]  Ferritin     Status: None   Collection Time: 09/19/15  3:41 AM  Result Value Ref Range   Ferritin 103 11 - 307 ng/mL    Comment: Performed at [facility]  Reticulocytes     Status: Abnormal   Collection Time: 09/19/15  3:41 AM  Result Value Ref Range   Retic Ct Pct 3.7 (H) 0.4 - 3.1 %   RBC. 2.81 (L) 3.87 - 5.11 MIL/uL   Retic Count, Manual 104.0 19.0 - 186.0 K/uL  CBC     Status: Abnormal   Collection Time: 09/19/15  6:53 AM  Result Value Ref Range   WBC 4.5 4.0 - 10.5 K/uL   RBC 2.95 (L) 3.87 - 5.11 MIL/uL   Hemoglobin 9.2 (L) 12.0 - 15.0 g/dL   HCT 28.7 (L) 36.0 - 46.0 %   MCV 97.3 78.0 - 100.0 fL   MCH 31.2 26.0 - 34.0 pg   MCHC 32.1  30.0 - 36.0 g/dL   RDW 14.4 11.5 - 15.5 %   Platelets 233 150 - 400 K/uL  Protime-INR     Status: None   Collection Time: 09/19/15  6:53 AM  Result Value Ref Range   Prothrombin Time 14.8 11.6 - 15.2 seconds   INR 1.18 0.00 - 1.49  APTT     Status: None   Collection Time: 09/19/15  6:53 AM  Result Value Ref Range   aPTT 32 24 - 37 seconds        ASSESSMENT AND PLAN:   1. 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  Ms. Villagomez is doing well with no clinical symptoms worrisome for cancer recurrence at this time. I have reviewed the recommendations for ongoing surveillance with her and she will follow-up with [place] in one year's time.  She will be due mammogram in September 2017 and I have entered orders for this to be scheduled following today's appointment.  She was instructed to make [place] aware if she notes any change within her breast, any new symptoms such as pain, shortness of breath, weight loss, or fatigue.  I believe that the area under her right axilla is a cyst, not a lymph node, and have encouraged them to monitor it.  If it changes in any way, I have asked that they let us know and instructed her to use warm compresses on it.    2. Weight loss: I believe that Ms. Laramie; continued weight loss is most likely caused by her dementia. They state that her appetite is good and that she eats frequent meals.  I have encouraged Ms. Shidler and her daughter to continue the ensure supplements to help increase her protein and fats.    3. Hip pain: I believe that Ms. Wile' pain is likely related to her fall in late March.  Imaging shows no evidence of fracture or metastatic disease at the site, but she does have osteoarthritis and osteopenic changes.   I have encouraged them to continue to monitor this and report if it worsens, rather than improves.    4. Support services/counseling:  Ms. Burgueno was offered support today through active listening and expressive supportive counseling.  She is now 10 years post her cancer diagnosis and doing well from that standpoint.  As above, her daughter and husband have reiterated that they are not interested in palliative care referral at this time.  As her dementia worsens, I believe that this would be of great benefit to them.     A total of 30 minutes of face-to-face time was spent with this patient with greater than 50% of that time in counseling and care-coordination.   Sylvan Cheese, NP  [facility] [place] [phone]   Note: PRIMARY CARE PROVIDER Scarlette Calico, MD [phone]

## 2015-12-06 NOTE — Telephone Encounter (Signed)
Gave and printed appt sched and avs for pt for Sept 22 @ 7:15am at [facility]...and June 2018

## 2015-12-16 ENCOUNTER — Encounter (HOSPITAL_COMMUNITY): Payer: Self-pay | Admitting: Emergency Medicine

## 2015-12-16 ENCOUNTER — Emergency Department (HOSPITAL_COMMUNITY)
Admission: EM | Admit: 2015-12-16 | Discharge: 2015-12-16 | Disposition: A | Discharge status: Home / Self Care | Payer: Medicare Other | Attending: Emergency Medicine | Admitting: Emergency Medicine

## 2015-12-16 ENCOUNTER — Emergency Department (HOSPITAL_COMMUNITY): Payer: Medicare Other

## 2015-12-16 DIAGNOSIS — I10 Essential (primary) hypertension: Secondary | ICD-10-CM | POA: Insufficient documentation

## 2015-12-16 DIAGNOSIS — S199XXA Unspecified injury of neck, initial encounter: Secondary | ICD-10-CM | POA: Diagnosis not present

## 2015-12-16 DIAGNOSIS — W19XXXA Unspecified fall, initial encounter: Secondary | ICD-10-CM

## 2015-12-16 DIAGNOSIS — E039 Hypothyroidism, unspecified: Secondary | ICD-10-CM | POA: Insufficient documentation

## 2015-12-16 DIAGNOSIS — S61511A Laceration without foreign body of right wrist, initial encounter: Secondary | ICD-10-CM

## 2015-12-16 DIAGNOSIS — Y92002 Bathroom of unspecified non-institutional (private) residence single-family (private) house as the place of occurrence of the external cause: Secondary | ICD-10-CM | POA: Insufficient documentation

## 2015-12-16 DIAGNOSIS — G20 Parkinson's disease: Secondary | ICD-10-CM | POA: Insufficient documentation

## 2015-12-16 DIAGNOSIS — Y939 Activity, unspecified: Secondary | ICD-10-CM | POA: Insufficient documentation

## 2015-12-16 DIAGNOSIS — Y999 Unspecified external cause status: Secondary | ICD-10-CM | POA: Diagnosis not present

## 2015-12-16 DIAGNOSIS — Z853 Personal history of malignant neoplasm of breast: Secondary | ICD-10-CM | POA: Diagnosis not present

## 2015-12-16 DIAGNOSIS — S0990XA Unspecified injury of head, initial encounter: Secondary | ICD-10-CM | POA: Diagnosis not present

## 2015-12-16 MED ORDER — LIDOCAINE-EPINEPHRINE (PF) 2 %-1:200000 IJ SOLN: 10.0000 mL | Freq: Once | INTRAMUSCULAR | Status: DC

## 2015-12-16 MED ORDER — LIDOCAINE-EPINEPHRINE (PF) 1 %-1:200000 IJ SOLN
10.0000 mL | Freq: Once | INTRAMUSCULAR | Status: AC
  Administered 2015-12-16: 6 mL
  Filled 2015-12-16: qty 30

## 2015-12-16 NOTE — ED Notes (Addendum)
Patient has @ 5 cm laceration to anterior right wrist.  Patient apparently had unwitnessed fall at home and was found to be bleeding.  Unknown what she fell against.  Patient has bilateral bruising to both cheeks.

## 2015-12-16 NOTE — ED Notes (Signed)
Pt BIB daughter, reports laceration to right wrist noticed at 1030. Tendon visible. Bleeding controlled.

## 2015-12-16 NOTE — Discharge Instructions (Signed)

## 2015-12-16 NOTE — ED Provider Notes (Signed)
CSN: [id number]     Arrival date & time 12/16/15  1143 History   First MD Initiated Contact with Patient 12/16/15 1341     Chief Complaint  Patient presents with  . Extremity Laceration     (Consider location/radiation/quality/duration/timing/severity/associated sxs/prior Treatment) HPI   [name] is a 75 year old female with past medical history of Parkinson's disease, PVD, HTN who presents to the ED today complaining of a laceration to her right wrist. Level V caveat, dementia. Per patient's husband, patient has frequent falls due to her Parkinson's disease. This morning he hurt patient fall in the bathroom. He went to check on her and she appeared to be okay and was ambulatory. He has been left to go to a funeral so a caregiver arrived to take care of the patient and his placed. The caregiver noticed a laceration to patient's right wrist. Bleeding is controlled. Patient was taken to urgent care prior to arrival and they did not feel they could repair this study center to the ED for further evaluation. Per patient's husband patient appears slowed to respond more than normal. He is unsure if patient hit her head. She is not on blood thinners.  Past Medical History  Diagnosis Date  . Glaucoma   . Abnormal chest x-ray   . Hypertension   . Peripheral vascular disease ([place])   . Venous insufficiency   . Hypercholesteremia   . Hypothyroid   . Diverticulosis of colon   . DJD (degenerative joint disease)   . Hip pain   . Lumbar back pain   . Osteopenia   . Vitamin D deficiency   . Parkinson disease ([place])   . Anxiety   . Breast cancer ([place])     stage 0 left   Past Surgical History  Procedure Laterality Date  . Ovarian cyst removal    . Lapraoscopic cholecystectomy  09/2003    Dr. Dalbert Batman  . Lumbar laminectomy and foraminotomies for spinal stenosis  11/2005    Dr. Gladstone Lighter  . Cataract surgery and argon laser trabeculoplasty  04/2010    Dr. Herbert Deaner  . Colonoscopy    . Polypectomy     . Cholecystectomy    . Breast lumpectomy  8/11 by [facility]  . Incise and drain abcess  10/04/11    sebaceous cyst on back  . Lumbar laminectomy/decompression microdiscectomy  05/20/2012    Procedure: LUMBAR LAMINECTOMY/DECOMPRESSION MICRODISCECTOMY 2 LEVELS;  Surgeon: Tobi Bastos, MD;  Location: [facility];  Service: Orthopedics;  Laterality: N/A;   Family History  Problem Relation Age of Onset  . Colon cancer Father   . Osteoporosis Neg Hx    Social History  Substance Use Topics  . Smoking status: Never Smoker   . Smokeless tobacco: Never Used  . Alcohol Use: No   OB History    No data available     Review of Systems  Unable to perform ROS: Dementia      Allergies  Review of patient's allergies indicates no known allergies.  Home Medications   Prior to Admission medications   Medication Sig Start Date End Date Taking? Authorizing Provider  carbidopa-levodopa-entacapone (STALEVO) 25-100-200 MG tablet TAKE 1 TABLET BY MOUTH 4 (FOUR) TIMES DAILY. 11/28/15  Yes Penni Bombard, MD  cholecalciferol (VITAMIN D) 1000 units tablet Take 1 tablet (1,000 Units total) by mouth daily. Patient taking differently: Take 2,000 Units by mouth daily.  08/05/15 08/04/16 Yes Evie Lacks Plotnikov, MD  KLOR-CON M20 20 MEQ tablet TAKE 1 TABLET (20  MEQ TOTAL) BY MOUTH 3 (THREE) TIMES DAILY. 09/12/15  Yes Janith Lima, MD  levothyroxine (SYNTHROID, LEVOTHROID) 88 MCG tablet TAKE 1 TABLET BY MOUTH EVERY DAY 08/29/15  Yes Janith Lima, MD   BP 148/66 mmHg  Pulse 51  Temp(Src) 97.9 F (36.6 C) (Oral)  Resp 16  SpO2 96% Physical Exam  Constitutional: No distress.  Frail, elderly female  HENT:  Head: Normocephalic and atraumatic.  Mouth/Throat: No oropharyngeal exudate.  Eyes: Conjunctivae and EOM are normal. Pupils are equal, round, and reactive to light. Right eye exhibits no discharge. Left eye exhibits no discharge. No scleral icterus.  Cardiovascular: Normal rate, regular rhythm,  normal heart sounds and intact distal pulses.  Exam reveals no gallop and no friction rub.   No murmur heard. Pulmonary/Chest: Effort normal and breath sounds normal. No respiratory distress. She has no wheezes. She has no rales. She exhibits no tenderness.  Abdominal: Soft. She exhibits no distension. There is no tenderness. There is no guarding.  Musculoskeletal: Normal range of motion. She exhibits no edema.  3cm horizontal laceration to volar aspect of right wrist with tendon exposure. No active bleeding. No foreign bodies seen or palpated. Intact distal pulses. No evidence of tendon injury, pt able to flex and extend wrist without difficulty.  Neurological: She is alert.  Disoriented to place and time. Tremors in bilateral UE. No sensory deficits  Skin: Skin is warm and dry. No rash noted. She is not diaphoretic. No erythema. No pallor.  Psychiatric: She has a normal mood and affect. Her behavior is normal.  Nursing note and vitals reviewed.   ED Course  Procedures (including critical care time)  LACERATION REPAIR Performed by: PA student, supervised by Donnald Garre PA-C  Consent: Verbal consent obtained. Risks and benefits: risks, benefits and alternatives were discussed Consent given by: patient Patient identity confirmed: provided demographic data Prepped and Draped in normal sterile fashion Wound explored  Laceration Location: right wrist  Laceration Length: 3cm  No Foreign Bodies seen or palpated  Anesthesia: local infiltration  Local anesthetic: lidocaine 1% with epinephrine  Anesthetic total: 3 ml  Irrigation method: syringe Amount of cleaning: standard   Skin closure: approximated Number of sutures: 7  Technique: simple interrupted  Patient tolerance: Patient tolerated the procedure well with no immediate complications.  Labs Review Labs Reviewed - No data to display  Imaging Review Ct Head Wo Contrast  12/16/2015  CLINICAL DATA:  Unwitnessed fall  at home. Bilateral facial bruising. EXAM: CT HEAD WITHOUT CONTRAST CT CERVICAL SPINE WITHOUT CONTRAST TECHNIQUE: Multidetector CT imaging of the head and cervical spine was performed following the standard protocol without intravenous contrast. Multiplanar CT image reconstructions of the cervical spine were also generated. COMPARISON:  Head CT 09/13/2015 FINDINGS: CT HEAD FINDINGS There is no evidence for acute hemorrhage, hydrocephalus, mass lesion, or abnormal extra-axial fluid collection. No definite CT evidence for acute infarction. Diffuse loss of parenchymal volume is consistent with atrophy. Patchy low attenuation in the deep hemispheric and periventricular white matter is nonspecific, but likely reflects chronic microvascular ischemic demyelination. High right posterior parietal hypodensity is compatible with stable appearing chronic infarct, potentially watershed amount. Stable appearance of small extra-axial left frontal calcification, potentially meningioma. Old right inferior orbital wall blowout fracture again noted. 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 CT CERVICAL SPINE FINDINGS Imaging was obtained from the skullbase through the T2 vertebral body. No evidence of fracture. No subluxation. Loss of disc height  with endplate spurring is seen at C5-6 and C6-7. Relatively diffuse facet osteoarthritis is noted bilaterally. Straightening of the normal cervical lordosis is evident. No prevertebral soft tissue swelling. IMPRESSION: 1. Stable CT evaluation of the head. No new or acute intracranial abnormality. 2. Atrophy with chronic small vessel white matter ischemic disease. 3. Extra-axial left frontal calcification is unchanged, likely related to meningioma. 4. 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 5. Old right inferior orbital wall blowout fracture. 6. Degenerative changes in the cervical spine without acute  fracture. Electronically Signed   By: Misty Stanley M.D.   On: 12/16/2015 15:31   Ct Cervical Spine Wo Contrast  12/16/2015  CLINICAL DATA:  Unwitnessed fall at home. Bilateral facial bruising. EXAM: CT HEAD WITHOUT CONTRAST CT CERVICAL SPINE WITHOUT CONTRAST TECHNIQUE: Multidetector CT imaging of the head and cervical spine was performed following the standard protocol without intravenous contrast. Multiplanar CT image reconstructions of the cervical spine were also generated. COMPARISON:  Head CT 09/13/2015 FINDINGS: CT HEAD FINDINGS There is no evidence for acute hemorrhage, hydrocephalus, mass lesion, or abnormal extra-axial fluid collection. No definite CT evidence for acute infarction. Diffuse loss of parenchymal volume is consistent with atrophy. Patchy low attenuation in the deep hemispheric and periventricular white matter is nonspecific, but likely reflects chronic microvascular ischemic demyelination. High right posterior parietal hypodensity is compatible with stable appearing chronic infarct, potentially watershed amount. Stable appearance of small extra-axial left frontal calcification, potentially meningioma. Old right inferior orbital wall blowout fracture again noted. 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 CT CERVICAL SPINE FINDINGS Imaging was obtained from the skullbase through the T2 vertebral body. No evidence of fracture. No subluxation. Loss of disc height with endplate spurring is seen at C5-6 and C6-7. Relatively diffuse facet osteoarthritis is noted bilaterally. Straightening of the normal cervical lordosis is evident. No prevertebral soft tissue swelling. IMPRESSION: 1. Stable CT evaluation of the head. No new or acute intracranial abnormality. 2. Atrophy with chronic small vessel white matter ischemic disease. 3. Extra-axial left frontal calcification is unchanged, likely related to meningioma. 4. Tornwaldt cyst again  noted in the posterior midline nasopharynx. 5. Old right inferior orbital wall blowout fracture. 6. Degenerative changes in the cervical spine without acute fracture. Electronically Signed   By: Misty Stanley M.D.   On: 12/16/2015 15:31   I have personally reviewed and evaluated these images and lab results as part of my medical decision-making.   EKG Interpretation None      MDM   Final diagnoses:  Fall  Wrist laceration, right, initial encounter    75 year old female with history of Parkinson's, dementia who presents to the ED today to be evaluated after a fall. Fall was unwitnessed and patient suffered a laceration to the volar aspect of her right wrist. There is a 3 cm laceration on exam with tendon exposure. No evidence of tendon injury as patient is able to flex and extend wrist without difficulty. Laceration was repaired by the PA student and supervised by myself. Wound was cleaned with pressure irrigation. No foreign body seen or palpated. We were able to visualize the bottom of the wound a bloodless field. CT head also obtained as follows unwitnessed which did not reveal any acute intracranial abnormality. Patient was image 40 in the ED without difficulty. Discussed wound care with patient and family. She will need to have these removed in 7 days. Strict return precautions given and discussed.  Case discussed  with Dr. Audie Pinto who agrees with treatment plan.    Dondra Spry [place], PA-C 12/16/15 1952  Leonard Schwartz, MD 12/17/15 0900

## 2015-12-20 ENCOUNTER — Encounter: Payer: Self-pay | Admitting: Diagnostic Neuroimaging

## 2015-12-20 ENCOUNTER — Ambulatory Visit (INDEPENDENT_AMBULATORY_CARE_PROVIDER_SITE_OTHER): Payer: Medicare Other | Admitting: Diagnostic Neuroimaging

## 2015-12-20 VITALS — BP 95/59 | HR 65 | Ht 59.0 in | Wt 84.6 lb

## 2015-12-20 DIAGNOSIS — F0281 Dementia in other diseases classified elsewhere with behavioral disturbance: Secondary | ICD-10-CM | POA: Diagnosis not present

## 2015-12-20 DIAGNOSIS — R269 Unspecified abnormalities of gait and mobility: Secondary | ICD-10-CM

## 2015-12-20 DIAGNOSIS — G20 Parkinson's disease: Secondary | ICD-10-CM | POA: Diagnosis not present

## 2015-12-20 DIAGNOSIS — R627 Adult failure to thrive: Secondary | ICD-10-CM

## 2015-12-20 MED ORDER — CARBIDOPA-LEVODOPA-ENTACAPONE 25-100-200 MG PO TABS: 1.0000 | ORAL_TABLET | Freq: Three times a day (TID) | ORAL | Status: DC

## 2015-12-20 NOTE — Progress Notes (Signed)
[facility]  PATIENT: [name] DOB: 1940-11-28  REFERRING CLINICIAN:  HISTORY FROM: patient and husband REASON FOR VISIT: follow up   HISTORICAL  CHIEF COMPLAINT:  Chief Complaint  Patient presents with  . Parkinson's Disease    rm 6, dagtr- [name], husbandDenyse [name], "symptoms worsening: loss of memory increased, diff walking, sleeping more so not always taking all doses carba-levo medication, freq falls, cont weight loss even w/good appetite "  . Follow-up    1 year    HISTORY OF PRESENT ILLNESS:   UPDATE 12/20/15: Since last visit, more falls (including last Friday). Has aid 4 hours x 3 days per week. Tolerating meds, but difficult to take 4 x per day (stalevo) due to sleep and eating schedule. No wearing off per family.   UPDATE 11/16/14: Since last visit, still falling, more memory problems. Tolerating meds. Sitter comes to home 3 hrs per week. Pt hs 24 hr supervision (mainly by husband). He has looked into adult daycare center nearby their home.  UPDATE 05/18/14: Since last visit, many more falls, usually when patient is alone and tries to walk down steps or take a shower unsupervised. Husband has sitter now when he needs to go out. Not much benefit with azilect. Tremor under control. Some head nodding behavior.   UPDATE 02/22/14: Since last visit, more falls. No warning. Has occurred with getting out of bed, bending over, making the bed. Husband hears a "thud" in the other room and finds her on the floor, no confusion or LOC. Hallucinations stable. No wearing off except in early AM before 1st dose stalevo.   UPDATE 12/11/13: Since last visit, was stable until 3 weeks ago. Now with more confusion, intermittent, sometimes not recognizing her family. Sometimes she [place] is [place] the [place], even though she is standing in her home. No fevers, chills, cough, diarrhea, pain, SOB, CP, N, V. Non threatening visual hallucinations are stable. Memory  worsening. Could not tolerate exelon patch (slightly paranoid about its effects).   UPDATE 10/30/12: Since last visit patient is stable. Reduced dosing of Stalevo seems to have improved her hallucinations, but her handwriting has worsened. She still sees children and people in her home, but according to the husband this is much less frequent. He has had to adjust some of the objects in his home which were bothering her. For example plants or water heaters high up in the room, or radiators on the floor, seem to bother her more. Once he removed these from her view she felt better. Tremor, gait and balance are stable. No falls. She does have more stooped posture than before but this seems to be stable since her back surgery. She is having some or drooling. She's having more runny nose. She keeps a napkin with her to wipe her nose. Patient's husband reports more short-term memory loss than before.  UPDATE 09/05/12: Since last visit, has reduced stalevo to QID. no change in hallucinations or parkinson's symptoms. until 1 month ago, some progression of confusion, memory, hallucinations and paranoia. this fluctuates throughout the day.  UPDATE 07/07/12: Since last visit, tried seroquel x 2 weeks, but hallucinations worsened. Now off seroquel. Also had low back surgery (nov 2013), and went to rehab. Now back home. Stalevo dosing stable.  UPDATE 04/16/12: Never picked up seroquel after last visit. she didn't know that i rx'd this, even though we discussed it. will try rx again. hallucinations are stable. some more stiffness in hips lately. on 5  tabs stalevo per day.  UPDATE 08/14/11:  Continues to have hallucinations, mostly at night, denies being afraid but feels they are bothersome.  Has not thought about calling 911.  Realizes they are hallucinations.  Took Stalevo 4 times per day for one week without improvement of hallucinations, no change in parkinson's symptoms nowback up to 5 times a day.  She is interested in  trying other medications for relief.  Also verbalizes concerns of choking episodes in her sleep.  Occurs approximately 3-4 times per month.  Denies choking episodes with eating or drinking.  PRIOR HPI: 75 year old female with history of hypothyroidism, hypertension, peripheral vascular disease presenting for evaluation and management of Parkinson's disease.  She is accompanied by her husband. In 2004 patient began to develop tremor in her bilateral upper extremities. She was diagnosed with Parkinson's disease. She was initially treated with Mirapex and Sinemet by Dr. Jacolyn Reedy ([place]).  She began to develop hallucinations and was ultimately changed to Stalevo by Dr. Marzetta Board [facility]).  She has done fairly well on this medication. She takes Stalevo 37.5/150/200, 1 tab five times a day (7am, 11am, 3pm, 7pm, bedtime).  She notices wearing off around 4 hours after her dose. She has not had any falls. She denies hallucinations or constipation.  She would like to re-establish care with a local neurologist.   REVIEW OF SYSTEMS: Full 14 system review of systems performed and negative except for: drooling decr activity unexpected wt change incontinent bowels incontinent bladder walkign diff speech diff memory loss easy bruising.    ALLERGIES: No Known Allergies  HOME MEDICATIONS: Outpatient Prescriptions Prior to Visit  Medication Sig Dispense Refill  . carbidopa-levodopa-entacapone (STALEVO) 25-100-200 MG tablet TAKE 1 TABLET BY MOUTH 4 (FOUR) TIMES DAILY. 120 tablet 0  . cholecalciferol (VITAMIN D) 1000 units tablet Take 1 tablet (1,000 Units total) by mouth daily. (Patient taking differently: Take 2,000 Units by mouth daily. ) 100 tablet 3  . KLOR-CON M20 20 MEQ tablet TAKE 1 TABLET (20 MEQ TOTAL) BY MOUTH 3 (THREE) TIMES DAILY. 90 tablet 1  . levothyroxine (SYNTHROID, LEVOTHROID) 88 MCG tablet TAKE 1 TABLET BY MOUTH EVERY DAY 90 tablet 1   No facility-administered medications prior to visit.     PAST MEDICAL HISTORY: Past Medical History  Diagnosis Date  . Glaucoma   . Abnormal chest x-ray   . Hypertension   . Peripheral vascular disease ([place])   . Venous insufficiency   . Hypercholesteremia   . Hypothyroid   . Diverticulosis of colon   . DJD (degenerative joint disease)   . Hip pain   . Lumbar back pain   . Osteopenia   . Vitamin D deficiency   . Parkinson disease ([place])   . Anxiety   . Breast cancer ([place])     stage 0 left    PAST SURGICAL HISTORY: Past Surgical History  Procedure Laterality Date  . Ovarian cyst removal    . Lapraoscopic cholecystectomy  09/2003    Dr. Dalbert Batman  . Lumbar laminectomy and foraminotomies for spinal stenosis  11/2005    Dr. Gladstone Lighter  . Cataract surgery and argon laser trabeculoplasty  04/2010    Dr. Herbert Deaner  . Colonoscopy    . Polypectomy    . Cholecystectomy    . Breast lumpectomy  8/11 by DrWakefield  . Incise and drain abcess  10/04/11    sebaceous cyst on back  . Lumbar laminectomy/decompression microdiscectomy  05/20/2012    Procedure: LUMBAR LAMINECTOMY/DECOMPRESSION MICRODISCECTOMY 2 LEVELS;  Surgeon: Tobi Bastos, MD;  Location: [facility];  Service: Orthopedics;  Laterality: N/A;    FAMILY HISTORY: Family History  Problem Relation Age of Onset  . Colon cancer Father   . Osteoporosis Neg Hx     SOCIAL HISTORY:  Social History   Social History  . Marital Status: Married    Spouse Name: [name]  . Number of Children: 2  . Years of Education: Assoc   Occupational History  . retired   .     Social History Main Topics  . Smoking status: Never Smoker   . Smokeless tobacco: Never Used  . Alcohol Use: No  . Drug Use: No  . Sexual Activity: Not Currently   Other Topics Concern  . Not on file   Social History Narrative   Pt lives at home with spouse.   Caffeine Use: Very little; once a week     PHYSICAL EXAM  Filed Vitals:   12/20/15 1002  BP: 95/59  Pulse: 65  Height: 4\' 11"  (1.499 m)  Weight: 84  lb 9.6 oz (38.374 kg)   Body mass index is 17.08 kg/(m^2).  Wt Readings from Last 3 Encounters:  12/20/15 84 lb 9.6 oz (38.374 kg)  12/06/15 87 lb 3.2 oz (39.554 kg)  09/12/15 95 lb (43.092 kg)   EXAM: General: Patient is awake, alert and in no acute distress.  Well developed and groomed. Neck is supple. Cardiovascular: No carotid artery bruits.  Heart is regular rate and rhythm with no murmurs.  Neurologic Exam  Mental Status: Awake, alert.  Language has decr fluency. Comprehension intact. POSITIVE SNOUT AND MYERSONS REFLEXES Cranial Nerves: Pupils are POST-SURGICAL. Visual fields are full to confrontation.  Conjugate eye movements are full and symmetric.  Facial sensation and strength are symmetric.  Hearing is intact.  Palate elevated symmetrically and uvula is midline.  Shoulder shug is symmetric.  Tongue is midline.  MASKED FACIES.  POSITIVE MYERSON'S SIGN. SOFT HOARSE VOICE. DROOLING.  Motor: Normal bulk and MOD COGWHEELING IN BUE. NO TREMOR. BRADYKINESIA IN BUE AND BLE (LEFT WORSE THAN RIGHT). Full strength in the upper and lower extremities.  No pronator drift. SOME HEAD NODDING MOVEMENTS WITH RAM OF BUE. Sensory: Intact and symmetric to light touch. Coordination: No ataxia or dysmetria on finger-nose or rapid alternating movement testing.  SLOW RAM. Reflexes: Deep tendon reflexes in the upper and lower extremity are TRACE and symmetric. Gait and Station: LEANING TO LEFT SIDE. STOOPED POSTURE. SLOW SHORT STEPS. USES SINGLE POINT CANE.      DIAGNOSTIC DATA (LABS, IMAGING, TESTING) - I reviewed patient records, labs, notes, testing and imaging myself where available.  Lab Results  Component Value Date   WBC 4.5 09/19/2015   HGB 9.2* 09/19/2015   HCT 28.7* 09/19/2015   MCV 97.3 09/19/2015   PLT 233 09/19/2015      Component Value Date/Time   NA 146* 09/19/2015 0020   NA 145 02/25/2014 0815   NA 145* 12/11/2013 0942   K 3.6 09/19/2015 0020   K 4.0 02/25/2014 0815   CL  110 09/19/2015 0020   CL 107 12/08/2012 0926   CO2 30 09/19/2015 0020   CO2 27 02/25/2014 0815   GLUCOSE 114* 09/19/2015 0020   GLUCOSE 110 02/25/2014 0815   GLUCOSE 104* 12/11/2013 0942   GLUCOSE 123* 12/08/2012 0926   BUN 20 09/19/2015 0020   BUN 15.1 02/25/2014 0815   BUN 14 12/11/2013 0942   CREATININE 0.76 09/19/2015 0020   CREATININE  0.8 02/25/2014 0815   CALCIUM 9.0 09/19/2015 0020   CALCIUM 8.8 02/25/2014 0815   PROT 6.9 09/13/2014 1413   PROT 6.1* 02/25/2014 0815   PROT 6.7 12/11/2013 0942   ALBUMIN 4.3 09/13/2014 1413   ALBUMIN 3.6 02/25/2014 0815   ALBUMIN 4.7 12/11/2013 0942   AST 20 09/13/2014 1413   AST 16 02/25/2014 0815   ALT 6 09/13/2014 1413   ALT 6 02/25/2014 0815   ALKPHOS 71 09/13/2014 1413   ALKPHOS 64 02/25/2014 0815   BILITOT 0.6 09/13/2014 1413   BILITOT 0.97 02/25/2014 0815   GFRNONAA >60 09/19/2015 0020   GFRAA >60 09/19/2015 0020   Lab Results  Component Value Date   CHOL 176 09/13/2014   HDL 61.70 09/13/2014   LDLCALC 92 09/13/2014   LDLDIRECT 142.2 10/28/2008   TRIG 111.0 09/13/2014   CHOLHDL 3 09/13/2014   Lab Results  Component Value Date   HGBA1C 5.4 09/13/2014   Lab Results  Component Value Date   VITAMINB12 335 09/19/2015   Lab Results  Component Value Date   TSH 32.47* 09/12/2015    09/09/12 MRI brain  - Mild perisylvian and moderate temporal atrophy. Moderate periventricular and subcortical chronic small vessel ischemic disease. No significant change from prior MRI on 05/08/11.  12/16/15 CT head / cervical [I reviewed images myself and agree with interpretation. -VRP]  1. Stable CT evaluation of the head. No new or acute intracranial abnormality. 2. Atrophy with chronic small vessel white matter ischemic disease. 3. Extra-axial left frontal calcification is unchanged, likely related to meningioma. 4. 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 5. Old right inferior orbital wall blowout fracture. 6.  Degenerative changes in the cervical spine without acute fracture.    ASSESSMENT AND PLAN  75 y.o. female with history of hypothyroidism, hypertension, peripheral vascular disease her for evaluation and management of advanced Parkinson's disease. She takes Stalevo 25/100/200, 1 tab 4 times a day. Non-threatening visual hallucinations are stable. More falls and memory problems lately. Unfortunately she did not benefit from PT. She needs help with bathing, walking, dressing. She can feed herself to some degree, but losing weight.    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 Continue stalevo 25/100/200; may reduce to TID dosing for convenience 2. Would favor palliative approach going forward; already has home health aid and family support  Meds ordered this encounter  Medications  . carbidopa-levodopa-entacapone (STALEVO) 25-100-200 MG tablet    Sig: Take 1 tablet by mouth 3 (three) times daily.    Dispense:  90 tablet    Refill:  12   Return in about 1 year (around 12/19/2016).     Penni Bombard, MD [id number], [id number] AM Certified in Neurology, Neurophysiology and Neuroimaging  [facility] [address] [place], [place] [address] [phone]

## 2015-12-22 ENCOUNTER — Other Ambulatory Visit: Payer: Self-pay | Admitting: Internal Medicine

## 2015-12-27 DIAGNOSIS — L02413 Cutaneous abscess of right upper limb: Secondary | ICD-10-CM | POA: Diagnosis not present

## 2015-12-27 DIAGNOSIS — L03113 Cellulitis of right upper limb: Secondary | ICD-10-CM | POA: Diagnosis not present

## 2015-12-29 ENCOUNTER — Encounter (HOSPITAL_BASED_OUTPATIENT_CLINIC_OR_DEPARTMENT_OTHER): Payer: Medicare Other | Attending: Internal Medicine

## 2015-12-29 DIAGNOSIS — I10 Essential (primary) hypertension: Secondary | ICD-10-CM | POA: Diagnosis not present

## 2015-12-29 DIAGNOSIS — F0280 Dementia in other diseases classified elsewhere without behavioral disturbance: Secondary | ICD-10-CM | POA: Diagnosis not present

## 2015-12-29 DIAGNOSIS — Z9181 History of falling: Secondary | ICD-10-CM | POA: Insufficient documentation

## 2015-12-29 DIAGNOSIS — L03113 Cellulitis of right upper limb: Secondary | ICD-10-CM | POA: Diagnosis not present

## 2015-12-29 DIAGNOSIS — T8133XA Disruption of traumatic injury wound repair, initial encounter: Secondary | ICD-10-CM | POA: Insufficient documentation

## 2015-12-29 DIAGNOSIS — G20 Parkinson's disease: Secondary | ICD-10-CM | POA: Insufficient documentation

## 2015-12-29 DIAGNOSIS — Y838 Other surgical procedures as the cause of abnormal reaction of the patient, or of later complication, without mention of misadventure at the time of the procedure: Secondary | ICD-10-CM | POA: Diagnosis not present

## 2015-12-29 DIAGNOSIS — S61511D Laceration without foreign body of right wrist, subsequent encounter: Secondary | ICD-10-CM | POA: Diagnosis not present

## 2015-12-29 DIAGNOSIS — S61501A Unspecified open wound of right wrist, initial encounter: Secondary | ICD-10-CM | POA: Diagnosis not present

## 2015-12-31 DIAGNOSIS — R627 Adult failure to thrive: Secondary | ICD-10-CM | POA: Diagnosis not present

## 2015-12-31 DIAGNOSIS — F419 Anxiety disorder, unspecified: Secondary | ICD-10-CM | POA: Diagnosis not present

## 2015-12-31 DIAGNOSIS — E039 Hypothyroidism, unspecified: Secondary | ICD-10-CM | POA: Diagnosis not present

## 2015-12-31 DIAGNOSIS — M150 Primary generalized (osteo)arthritis: Secondary | ICD-10-CM | POA: Diagnosis not present

## 2015-12-31 DIAGNOSIS — G20 Parkinson's disease: Secondary | ICD-10-CM | POA: Diagnosis not present

## 2015-12-31 DIAGNOSIS — K5790 Diverticulosis of intestine, part unspecified, without perforation or abscess without bleeding: Secondary | ICD-10-CM | POA: Diagnosis not present

## 2015-12-31 DIAGNOSIS — S61501D Unspecified open wound of right wrist, subsequent encounter: Secondary | ICD-10-CM | POA: Diagnosis not present

## 2015-12-31 DIAGNOSIS — Z853 Personal history of malignant neoplasm of breast: Secondary | ICD-10-CM | POA: Diagnosis not present

## 2015-12-31 DIAGNOSIS — I872 Venous insufficiency (chronic) (peripheral): Secondary | ICD-10-CM | POA: Diagnosis not present

## 2016-01-03 DIAGNOSIS — R627 Adult failure to thrive: Secondary | ICD-10-CM | POA: Diagnosis not present

## 2016-01-03 DIAGNOSIS — G20 Parkinson's disease: Secondary | ICD-10-CM | POA: Diagnosis not present

## 2016-01-03 DIAGNOSIS — I872 Venous insufficiency (chronic) (peripheral): Secondary | ICD-10-CM | POA: Diagnosis not present

## 2016-01-03 DIAGNOSIS — F419 Anxiety disorder, unspecified: Secondary | ICD-10-CM | POA: Diagnosis not present

## 2016-01-03 DIAGNOSIS — S61501D Unspecified open wound of right wrist, subsequent encounter: Secondary | ICD-10-CM | POA: Diagnosis not present

## 2016-01-03 DIAGNOSIS — K5790 Diverticulosis of intestine, part unspecified, without perforation or abscess without bleeding: Secondary | ICD-10-CM | POA: Diagnosis not present

## 2016-01-05 DIAGNOSIS — F0280 Dementia in other diseases classified elsewhere without behavioral disturbance: Secondary | ICD-10-CM | POA: Diagnosis not present

## 2016-01-05 DIAGNOSIS — T8133XA Disruption of traumatic injury wound repair, initial encounter: Secondary | ICD-10-CM | POA: Diagnosis not present

## 2016-01-05 DIAGNOSIS — S61501A Unspecified open wound of right wrist, initial encounter: Secondary | ICD-10-CM | POA: Diagnosis not present

## 2016-01-05 DIAGNOSIS — I10 Essential (primary) hypertension: Secondary | ICD-10-CM | POA: Diagnosis not present

## 2016-01-05 DIAGNOSIS — Z9181 History of falling: Secondary | ICD-10-CM | POA: Diagnosis not present

## 2016-01-05 DIAGNOSIS — G20 Parkinson's disease: Secondary | ICD-10-CM | POA: Diagnosis not present

## 2016-01-06 DIAGNOSIS — R627 Adult failure to thrive: Secondary | ICD-10-CM | POA: Diagnosis not present

## 2016-01-06 DIAGNOSIS — G20 Parkinson's disease: Secondary | ICD-10-CM | POA: Diagnosis not present

## 2016-01-06 DIAGNOSIS — I872 Venous insufficiency (chronic) (peripheral): Secondary | ICD-10-CM | POA: Diagnosis not present

## 2016-01-06 DIAGNOSIS — K5790 Diverticulosis of intestine, part unspecified, without perforation or abscess without bleeding: Secondary | ICD-10-CM | POA: Diagnosis not present

## 2016-01-06 DIAGNOSIS — F419 Anxiety disorder, unspecified: Secondary | ICD-10-CM | POA: Diagnosis not present

## 2016-01-06 DIAGNOSIS — S61501D Unspecified open wound of right wrist, subsequent encounter: Secondary | ICD-10-CM | POA: Diagnosis not present

## 2016-01-07 DIAGNOSIS — R627 Adult failure to thrive: Secondary | ICD-10-CM | POA: Diagnosis not present

## 2016-01-07 DIAGNOSIS — I872 Venous insufficiency (chronic) (peripheral): Secondary | ICD-10-CM | POA: Diagnosis not present

## 2016-01-07 DIAGNOSIS — K5790 Diverticulosis of intestine, part unspecified, without perforation or abscess without bleeding: Secondary | ICD-10-CM | POA: Diagnosis not present

## 2016-01-07 DIAGNOSIS — F419 Anxiety disorder, unspecified: Secondary | ICD-10-CM | POA: Diagnosis not present

## 2016-01-07 DIAGNOSIS — G20 Parkinson's disease: Secondary | ICD-10-CM | POA: Diagnosis not present

## 2016-01-07 DIAGNOSIS — S61501D Unspecified open wound of right wrist, subsequent encounter: Secondary | ICD-10-CM | POA: Diagnosis not present

## 2016-01-09 ENCOUNTER — Other Ambulatory Visit: Payer: Self-pay | Admitting: Diagnostic Neuroimaging

## 2016-01-10 DIAGNOSIS — K5790 Diverticulosis of intestine, part unspecified, without perforation or abscess without bleeding: Secondary | ICD-10-CM | POA: Diagnosis not present

## 2016-01-10 DIAGNOSIS — S61501D Unspecified open wound of right wrist, subsequent encounter: Secondary | ICD-10-CM | POA: Diagnosis not present

## 2016-01-10 DIAGNOSIS — G20 Parkinson's disease: Secondary | ICD-10-CM | POA: Diagnosis not present

## 2016-01-10 DIAGNOSIS — R627 Adult failure to thrive: Secondary | ICD-10-CM | POA: Diagnosis not present

## 2016-01-10 DIAGNOSIS — F419 Anxiety disorder, unspecified: Secondary | ICD-10-CM | POA: Diagnosis not present

## 2016-01-10 DIAGNOSIS — I872 Venous insufficiency (chronic) (peripheral): Secondary | ICD-10-CM | POA: Diagnosis not present

## 2016-01-11 DIAGNOSIS — K5790 Diverticulosis of intestine, part unspecified, without perforation or abscess without bleeding: Secondary | ICD-10-CM | POA: Diagnosis not present

## 2016-01-11 DIAGNOSIS — R627 Adult failure to thrive: Secondary | ICD-10-CM | POA: Diagnosis not present

## 2016-01-11 DIAGNOSIS — I872 Venous insufficiency (chronic) (peripheral): Secondary | ICD-10-CM | POA: Diagnosis not present

## 2016-01-11 DIAGNOSIS — F419 Anxiety disorder, unspecified: Secondary | ICD-10-CM | POA: Diagnosis not present

## 2016-01-11 DIAGNOSIS — G20 Parkinson's disease: Secondary | ICD-10-CM | POA: Diagnosis not present

## 2016-01-11 DIAGNOSIS — S61501D Unspecified open wound of right wrist, subsequent encounter: Secondary | ICD-10-CM | POA: Diagnosis not present

## 2016-01-12 DIAGNOSIS — I10 Essential (primary) hypertension: Secondary | ICD-10-CM | POA: Diagnosis not present

## 2016-01-12 DIAGNOSIS — T8133XA Disruption of traumatic injury wound repair, initial encounter: Secondary | ICD-10-CM | POA: Diagnosis not present

## 2016-01-12 DIAGNOSIS — G20 Parkinson's disease: Secondary | ICD-10-CM | POA: Diagnosis not present

## 2016-01-12 DIAGNOSIS — S61501A Unspecified open wound of right wrist, initial encounter: Secondary | ICD-10-CM | POA: Diagnosis not present

## 2016-01-12 DIAGNOSIS — F0280 Dementia in other diseases classified elsewhere without behavioral disturbance: Secondary | ICD-10-CM | POA: Diagnosis not present

## 2016-01-12 DIAGNOSIS — Z9181 History of falling: Secondary | ICD-10-CM | POA: Diagnosis not present

## 2016-01-14 DIAGNOSIS — R627 Adult failure to thrive: Secondary | ICD-10-CM | POA: Diagnosis not present

## 2016-01-14 DIAGNOSIS — F419 Anxiety disorder, unspecified: Secondary | ICD-10-CM | POA: Diagnosis not present

## 2016-01-14 DIAGNOSIS — K5790 Diverticulosis of intestine, part unspecified, without perforation or abscess without bleeding: Secondary | ICD-10-CM | POA: Diagnosis not present

## 2016-01-14 DIAGNOSIS — I872 Venous insufficiency (chronic) (peripheral): Secondary | ICD-10-CM | POA: Diagnosis not present

## 2016-01-14 DIAGNOSIS — S61501D Unspecified open wound of right wrist, subsequent encounter: Secondary | ICD-10-CM | POA: Diagnosis not present

## 2016-01-14 DIAGNOSIS — G20 Parkinson's disease: Secondary | ICD-10-CM | POA: Diagnosis not present

## 2016-01-16 DIAGNOSIS — F419 Anxiety disorder, unspecified: Secondary | ICD-10-CM | POA: Diagnosis not present

## 2016-01-16 DIAGNOSIS — R627 Adult failure to thrive: Secondary | ICD-10-CM | POA: Diagnosis not present

## 2016-01-16 DIAGNOSIS — G20 Parkinson's disease: Secondary | ICD-10-CM | POA: Diagnosis not present

## 2016-01-16 DIAGNOSIS — I872 Venous insufficiency (chronic) (peripheral): Secondary | ICD-10-CM | POA: Diagnosis not present

## 2016-01-16 DIAGNOSIS — K5790 Diverticulosis of intestine, part unspecified, without perforation or abscess without bleeding: Secondary | ICD-10-CM | POA: Diagnosis not present

## 2016-01-16 DIAGNOSIS — S61501D Unspecified open wound of right wrist, subsequent encounter: Secondary | ICD-10-CM | POA: Diagnosis not present

## 2016-01-17 DIAGNOSIS — D259 Leiomyoma of uterus, unspecified: Secondary | ICD-10-CM | POA: Diagnosis not present

## 2016-01-17 DIAGNOSIS — Z1231 Encounter for screening mammogram for malignant neoplasm of breast: Secondary | ICD-10-CM | POA: Diagnosis not present

## 2016-01-17 DIAGNOSIS — Z681 Body mass index (BMI) 19 or less, adult: Secondary | ICD-10-CM | POA: Diagnosis not present

## 2016-01-17 DIAGNOSIS — Z01411 Encounter for gynecological examination (general) (routine) with abnormal findings: Secondary | ICD-10-CM | POA: Diagnosis not present

## 2016-01-17 DIAGNOSIS — E039 Hypothyroidism, unspecified: Secondary | ICD-10-CM | POA: Diagnosis not present

## 2016-01-17 DIAGNOSIS — Z853 Personal history of malignant neoplasm of breast: Secondary | ICD-10-CM | POA: Diagnosis not present

## 2016-01-21 DIAGNOSIS — I872 Venous insufficiency (chronic) (peripheral): Secondary | ICD-10-CM | POA: Diagnosis not present

## 2016-01-21 DIAGNOSIS — R627 Adult failure to thrive: Secondary | ICD-10-CM | POA: Diagnosis not present

## 2016-01-21 DIAGNOSIS — F419 Anxiety disorder, unspecified: Secondary | ICD-10-CM | POA: Diagnosis not present

## 2016-01-21 DIAGNOSIS — K5790 Diverticulosis of intestine, part unspecified, without perforation or abscess without bleeding: Secondary | ICD-10-CM | POA: Diagnosis not present

## 2016-01-21 DIAGNOSIS — S61501D Unspecified open wound of right wrist, subsequent encounter: Secondary | ICD-10-CM | POA: Diagnosis not present

## 2016-01-21 DIAGNOSIS — G20 Parkinson's disease: Secondary | ICD-10-CM | POA: Diagnosis not present

## 2016-01-24 DIAGNOSIS — K5790 Diverticulosis of intestine, part unspecified, without perforation or abscess without bleeding: Secondary | ICD-10-CM | POA: Diagnosis not present

## 2016-01-24 DIAGNOSIS — S61501D Unspecified open wound of right wrist, subsequent encounter: Secondary | ICD-10-CM | POA: Diagnosis not present

## 2016-01-24 DIAGNOSIS — G20 Parkinson's disease: Secondary | ICD-10-CM | POA: Diagnosis not present

## 2016-01-24 DIAGNOSIS — R627 Adult failure to thrive: Secondary | ICD-10-CM | POA: Diagnosis not present

## 2016-01-24 DIAGNOSIS — F419 Anxiety disorder, unspecified: Secondary | ICD-10-CM | POA: Diagnosis not present

## 2016-01-24 DIAGNOSIS — I872 Venous insufficiency (chronic) (peripheral): Secondary | ICD-10-CM | POA: Diagnosis not present

## 2016-01-26 DIAGNOSIS — R627 Adult failure to thrive: Secondary | ICD-10-CM | POA: Diagnosis not present

## 2016-01-26 DIAGNOSIS — F419 Anxiety disorder, unspecified: Secondary | ICD-10-CM | POA: Diagnosis not present

## 2016-01-26 DIAGNOSIS — I872 Venous insufficiency (chronic) (peripheral): Secondary | ICD-10-CM | POA: Diagnosis not present

## 2016-01-26 DIAGNOSIS — S61501D Unspecified open wound of right wrist, subsequent encounter: Secondary | ICD-10-CM | POA: Diagnosis not present

## 2016-01-26 DIAGNOSIS — K5790 Diverticulosis of intestine, part unspecified, without perforation or abscess without bleeding: Secondary | ICD-10-CM | POA: Diagnosis not present

## 2016-01-26 DIAGNOSIS — G20 Parkinson's disease: Secondary | ICD-10-CM | POA: Diagnosis not present

## 2016-02-02 DIAGNOSIS — S61501D Unspecified open wound of right wrist, subsequent encounter: Secondary | ICD-10-CM | POA: Diagnosis not present

## 2016-02-02 DIAGNOSIS — R627 Adult failure to thrive: Secondary | ICD-10-CM | POA: Diagnosis not present

## 2016-02-02 DIAGNOSIS — I872 Venous insufficiency (chronic) (peripheral): Secondary | ICD-10-CM | POA: Diagnosis not present

## 2016-02-02 DIAGNOSIS — F419 Anxiety disorder, unspecified: Secondary | ICD-10-CM | POA: Diagnosis not present

## 2016-02-02 DIAGNOSIS — G20 Parkinson's disease: Secondary | ICD-10-CM | POA: Diagnosis not present

## 2016-02-02 DIAGNOSIS — K5790 Diverticulosis of intestine, part unspecified, without perforation or abscess without bleeding: Secondary | ICD-10-CM | POA: Diagnosis not present

## 2016-02-17 ENCOUNTER — Other Ambulatory Visit: Payer: Self-pay | Admitting: Internal Medicine

## 2016-02-18 ENCOUNTER — Other Ambulatory Visit: Payer: Self-pay | Admitting: Internal Medicine

## 2016-02-26 ENCOUNTER — Other Ambulatory Visit: Payer: Self-pay | Admitting: Diagnostic Neuroimaging

## 2016-03-16 DIAGNOSIS — Z853 Personal history of malignant neoplasm of breast: Secondary | ICD-10-CM | POA: Diagnosis not present

## 2016-03-16 DIAGNOSIS — Z1231 Encounter for screening mammogram for malignant neoplasm of breast: Secondary | ICD-10-CM | POA: Diagnosis not present

## 2016-03-16 LAB — HM MAMMOGRAPHY

## 2016-04-16 DIAGNOSIS — H401131 Primary open-angle glaucoma, bilateral, mild stage: Secondary | ICD-10-CM | POA: Diagnosis not present

## 2016-04-16 DIAGNOSIS — H26491 Other secondary cataract, right eye: Secondary | ICD-10-CM | POA: Diagnosis not present

## 2016-04-16 DIAGNOSIS — H26493 Other secondary cataract, bilateral: Secondary | ICD-10-CM | POA: Diagnosis not present

## 2016-04-20 ENCOUNTER — Other Ambulatory Visit: Payer: Self-pay | Admitting: Internal Medicine

## 2016-04-23 ENCOUNTER — Encounter: Payer: Self-pay | Admitting: Pulmonary Disease

## 2016-04-25 ENCOUNTER — Encounter: Payer: Self-pay | Admitting: Internal Medicine

## 2016-05-12 ENCOUNTER — Other Ambulatory Visit: Payer: Self-pay | Admitting: Internal Medicine

## 2016-05-21 ENCOUNTER — Telehealth: Payer: Self-pay

## 2016-05-21 ENCOUNTER — Other Ambulatory Visit: Payer: Self-pay | Admitting: Internal Medicine

## 2016-05-21 MED ORDER — POTASSIUM CHLORIDE CRYS ER 20 MEQ PO TBCR: EXTENDED_RELEASE_TABLET | ORAL | 1 refills | Status: DC

## 2016-05-21 NOTE — Telephone Encounter (Signed)
rq for 90 day supply of klor con. Recent refill for 30 day supply. Please advise.

## 2016-05-30 DIAGNOSIS — H26492 Other secondary cataract, left eye: Secondary | ICD-10-CM | POA: Diagnosis not present

## 2016-06-05 ENCOUNTER — Other Ambulatory Visit: Payer: Self-pay | Admitting: Internal Medicine

## 2016-06-21 NOTE — Progress Notes (Deleted)
Pre visit review using our clinic review tool, if applicable. No additional management support is needed unless otherwise documented below in the visit note. 

## 2016-06-21 NOTE — Progress Notes (Deleted)
Subjective:   [name] is a 75 y.o. female who presents for Medicare Annual (Subsequent) preventive examination.  The Patient was informed that the wellness visit is to identify future health risk and educate and initiate measures that can reduce risk for increased disease through the lifespan.   Describes health as fair, good or great?   Review of Systems:  No ROS.  Medicare Wellness Visit.    Sleep patterns:  Home Safety/Smoke Alarms:   Living environment; residence and Firearm Safety:  Seat Belt Safety/Bike Helmet:   Counseling:   Eye Exam-Last exam 04/16/16, Dr. Herbert Deaner  Dental-  Female:   Pap-N/A       Mammo-05/08/2016, negative.        Dexa scan-03/16/2015, Osteoporosis.         CCS-colonoscopy 12/13/10, normal.      Objective:     Vitals: There were no vitals taken for this visit.  There is no height or weight on file to calculate BMI.   Tobacco History  Smoking Status  . Never Smoker  Smokeless Tobacco  . Never Used     Counseling given: Not Answered   Past Medical History:  Diagnosis Date  . Abnormal chest x-ray   . Anxiety   . Breast cancer ([place])    stage 0 left  . Diverticulosis of colon   . DJD (degenerative joint disease)   . Glaucoma   . Hip pain   . Hypercholesteremia   . Hypertension   . Hypothyroid   . Lumbar back pain   . Osteopenia   . Parkinson disease ([place])   . Peripheral vascular disease ([place])   . Venous insufficiency   . Vitamin D deficiency    Past Surgical History:  Procedure Laterality Date  . BREAST LUMPECTOMY  8/11 by DrWakefield  . cataract surgery and argon laser trabeculoplasty  04/2010   Dr. Herbert Deaner  . CHOLECYSTECTOMY    . COLONOSCOPY    . INCISE AND DRAIN ABCESS  10/04/11   sebaceous cyst on back  . lapraoscopic cholecystectomy  09/2003   Dr. Dalbert Batman  . lumbar laminectomy and foraminotomies for spinal stenosis  11/2005   Dr. Gladstone Lighter  . LUMBAR LAMINECTOMY/DECOMPRESSION MICRODISCECTOMY  05/20/2012   Procedure: LUMBAR LAMINECTOMY/DECOMPRESSION MICRODISCECTOMY 2 LEVELS;  Surgeon: Tobi Bastos, MD;  Location: [facility];  Service: Orthopedics;  Laterality: N/A;  . OVARIAN CYST REMOVAL    . POLYPECTOMY     Family History  Problem Relation Age of Onset  . Colon cancer Father   . Osteoporosis Neg Hx    History  Sexual Activity  . Sexual activity: Not Currently    Outpatient Encounter Prescriptions as of 06/26/2016  Medication Sig  . carbidopa-levodopa-entacapone (STALEVO) 25-100-200 MG tablet TAKE 1 TABLET BY MOUTH 4 (FOUR) TIMES DAILY.  . cholecalciferol (VITAMIN D) 1000 units tablet Take 1 tablet (1,000 Units total) by mouth daily. (Patient taking differently: Take 2,000 Units by mouth daily. )  . KLOR-CON M20 20 MEQ tablet TAKE 1 TABLET (20 MEQ TOTAL) BY MOUTH 3 (THREE) TIMES DAILY.  Marland Kitchen levothyroxine (SYNTHROID, LEVOTHROID) 88 MCG tablet TAKE 1 TABLET BY MOUTH EVERY DAY  . potassium chloride SA (KLOR-CON M20) 20 MEQ tablet TAKE 1 TABLET (20 MEQ TOTAL) BY MOUTH 3 (THREE) TIMES DAILY.   No facility-administered encounter medications on file as of 06/26/2016.     Activities of Daily Living No flowsheet data found.  Patient Care Team: Janith Lima, MD as PCP - General (Internal Medicine) Latanya Maudlin,  MD as Attending Physician (Orthopedic Surgery)    Assessment:    Physical assessment deferred to PCP.  Exercise Activities and Dietary recommendations   Diet (meal preparation, eat out, water intake, caffeinated beverages, dairy products, fruits and vegetables):   Breakfast: Lunch:  Dinner:      Goals    None     Fall Risk Fall Risk  12/20/2015 08/05/2015 09/13/2014 03/18/2014 01/06/2013  Falls in the past year? Yes Yes Yes Yes No  Number falls in past yr: 2 or more 2 or more 2 or more 2 or more -  Injury with Fall? Yes Yes Yes - -  Risk Factor Category  - High Fall Risk High Fall Risk High Fall Risk -  Risk for fall due to : Impaired balance/gait;Impaired mobility -  Impaired balance/gait;Impaired mobility;Mental status change Impaired balance/gait;Impaired mobility;Mental status change -  Follow up - - Falls prevention discussed - -   Depression Screen PHQ 2/9 Scores 09/13/2014 03/18/2014 01/06/2013  PHQ - 2 Score 0 0 1     Cognitive Function MMSE - Mini Mental State Exam 02/22/2014  Orientation to time 4  Orientation to Place 4  Registration 3  Attention/ Calculation 2  Recall 0  Language- name 2 objects 2  Language- repeat 1  Language- follow 3 step command 3  Language- read & follow direction 1  Write a sentence 1  Copy design 0  Total score 21        Immunization History  Administered Date(s) Administered  . Influenza Split 05/11/2011, 05/02/2012  . Influenza Whole 03/31/2008, 07/06/2009, 04/21/2010  . Influenza, High Dose Seasonal PF 02/29/2016  . Influenza,inj,Quad PF,36+ Mos 03/16/2013  . Influenza-Unspecified 03/26/2015  . Pneumococcal Conjugate-13 09/13/2014  . Tdap 09/01/2013, 06/13/2015   Screening Tests Health Maintenance  Topic Date Due  . ZOSTAVAX  10/17/1998  . OPHTHALMOLOGY EXAM  03/13/2013  . HEMOGLOBIN A1C  03/16/2015  . FOOT EXAM  09/13/2015  . URINE MICROALBUMIN  09/13/2015  . PNA vac Low Risk Adult (2 of 2 - PPSV23) 09/13/2015  . TETANUS/TDAP  06/12/2025  . INFLUENZA VACCINE  Addressed  . DEXA SCAN  Completed      Plan:     Continue to eat heart healthy diet (full of fruits, vegetables, whole grains, lean protein, water--limit salt, fat, and sugar intake) and increase physical activity as tolerated.  Continue doing brain stimulating activities (puzzles, reading, adult coloring books, staying active) to keep memory sharp.   During the course of the visit the patient was educated and counseled about the following appropriate screening and preventive services:   Vaccines to include Pneumoccal, Influenza, Hepatitis B, Td, Zostavax, HCV  Cardiovascular Disease  Colorectal cancer screening  Bone density  screening  Diabetes screening  Glaucoma screening  Mammography/PAP  Nutrition counseling   Patient Instructions (the written plan) was given to the patient.   Gerilyn Nestle, RN  06/21/2016

## 2016-06-26 ENCOUNTER — Ambulatory Visit: Payer: Medicare Other

## 2016-07-04 ENCOUNTER — Ambulatory Visit (INDEPENDENT_AMBULATORY_CARE_PROVIDER_SITE_OTHER): Payer: Medicare Other | Admitting: Nurse Practitioner

## 2016-07-04 ENCOUNTER — Ambulatory Visit: Payer: Medicare Other | Admitting: Internal Medicine

## 2016-07-04 ENCOUNTER — Encounter: Payer: Self-pay | Admitting: Nurse Practitioner

## 2016-07-04 VITALS — BP 88/50 | HR 62 | Temp 98.0°F | Ht 59.0 in | Wt 79.8 lb

## 2016-07-04 DIAGNOSIS — R296 Repeated falls: Secondary | ICD-10-CM

## 2016-07-04 DIAGNOSIS — E559 Vitamin D deficiency, unspecified: Secondary | ICD-10-CM | POA: Diagnosis not present

## 2016-07-04 DIAGNOSIS — S098XXA Other specified injuries of head, initial encounter: Secondary | ICD-10-CM

## 2016-07-04 DIAGNOSIS — E034 Atrophy of thyroid (acquired): Secondary | ICD-10-CM | POA: Diagnosis not present

## 2016-07-04 DIAGNOSIS — R634 Abnormal weight loss: Secondary | ICD-10-CM

## 2016-07-04 DIAGNOSIS — M810 Age-related osteoporosis without current pathological fracture: Secondary | ICD-10-CM

## 2016-07-04 NOTE — Progress Notes (Signed)
Subjective:  Patient ID: [name], female    DOB: Oct 22, 1940  Age: 76 y.o. MRN: [id number]  CC: Tremors (concerened for (brain bleed). Check potassium, thyroid and vitamin d. Patient has been falling more often and worsening confusion. )  In office with son and husband.  HPI  Frequent falls: Family is concerned increased falls may be as results of head trauma. [place] 2.5weeks ago, hit head of right side, had right ear bleed. Fall was not witnessed. Family concerned increased falling may be due to head trauma or advancing parkinsons. Incontinent of bowel and bladder. Husband states she was able to control bowels in past but that has chnaged in last 32month. No change in appetite.No signs of dysphagia, has persistent drooling, no coughing, no SOB, no fever. No complaint of pain.  Outpatient Medications Prior to Visit  Medication Sig Dispense Refill  . carbidopa-levodopa-entacapone (STALEVO) 25-100-200 MG tablet TAKE 1 TABLET BY MOUTH 4 (FOUR) TIMES DAILY. 120 tablet 11  . KLOR-CON M20 20 MEQ tablet TAKE 1 TABLET (20 MEQ TOTAL) BY MOUTH 3 (THREE) TIMES DAILY. 90 tablet 1  . levothyroxine (SYNTHROID, LEVOTHROID) 88 MCG tablet TAKE 1 TABLET BY MOUTH EVERY DAY 90 tablet 1  . potassium chloride SA (KLOR-CON M20) 20 MEQ tablet TAKE 1 TABLET (20 MEQ TOTAL) BY MOUTH 3 (THREE) TIMES DAILY. 270 tablet 1  . cholecalciferol (VITAMIN D) 1000 units tablet Take 1 tablet (1,000 Units total) by mouth daily. (Patient taking differently: Take 2,000 Units by mouth daily. ) 100 tablet 3   No facility-administered medications prior to visit.     ROS See HPI  Objective:  BP (!) 88/50 (BP Location: Left Arm, Patient Position: Sitting, Cuff Size: Normal)   Pulse 62   Temp 98 F (36.7 C) (Oral)   Ht 4\' 11"  (1.499 m)   Wt 79 lb 12 oz (36.2 kg)   SpO2 97%   BMI 16.11 kg/m   BP Readings from Last 3 Encounters:  07/04/16 (!) 88/50  12/20/15 (!) 95/59  12/16/15 129/58    Wt Readings from Last 3  Encounters:  07/04/16 79 lb 12 oz (36.2 kg)  12/20/15 84 lb 9.6 oz (38.4 kg)  12/06/15 87 lb 3.2 oz (39.6 kg)    Physical Exam  Constitutional: No distress.  Cardiovascular: Normal rate and normal heart sounds.   Pulmonary/Chest: Effort normal and breath sounds normal.  Abdominal: Soft. Bowel sounds are normal. She exhibits no distension. There is no tenderness.  Musculoskeletal: She exhibits no edema or tenderness.  Neurological: She is alert.  Not oriented to family or place or situation  Skin: Skin is warm and dry. No rash noted.  Vitals reviewed.   Lab Results  Component Value Date   WBC 4.5 09/19/2015   HGB 9.2 (L) 09/19/2015   HCT 28.7 (L) 09/19/2015   PLT 233 09/19/2015   GLUCOSE 114 (H) 09/19/2015   CHOL 176 09/13/2014   TRIG 111.0 09/13/2014   HDL 61.70 09/13/2014   LDLDIRECT 142.2 10/28/2008   LDLCALC 92 09/13/2014   ALT 6 09/13/2014   AST 20 09/13/2014   NA 146 (H) 09/19/2015   K 3.6 09/19/2015   CL 110 09/19/2015   CREATININE 0.76 09/19/2015   BUN 20 09/19/2015   CO2 30 09/19/2015   TSH 32.47 (H) 09/12/2015   INR 1.18 09/19/2015   HGBA1C 5.4 09/13/2014   MICROALBUR <0.7 09/13/2014    Ct Head Wo Contrast  Result Date: 12/16/2015 CLINICAL DATA:  Unwitnessed fall at home.  Bilateral facial bruising. EXAM: CT HEAD WITHOUT CONTRAST CT CERVICAL SPINE WITHOUT CONTRAST TECHNIQUE: Multidetector CT imaging of the head and cervical spine was performed following the standard protocol without intravenous contrast. Multiplanar CT image reconstructions of the cervical spine were also generated. COMPARISON:  Head CT 09/13/2015 FINDINGS: CT HEAD FINDINGS There is no evidence for acute hemorrhage, hydrocephalus, mass lesion, or abnormal extra-axial fluid collection. No definite CT evidence for acute infarction. Diffuse loss of parenchymal volume is consistent with atrophy. Patchy low attenuation in the deep hemispheric and periventricular white matter is nonspecific, but likely  reflects chronic microvascular ischemic demyelination. High right posterior parietal hypodensity is compatible with stable appearing chronic infarct, potentially watershed amount. Stable appearance of small extra-axial left frontal calcification, potentially meningioma. Old right inferior orbital wall blowout fracture again noted. 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 CT CERVICAL SPINE FINDINGS Imaging was obtained from the skullbase through the T2 vertebral body. No evidence of fracture. No subluxation. Loss of disc height with endplate spurring is seen at C5-6 and C6-7. Relatively diffuse facet osteoarthritis is noted bilaterally. Straightening of the normal cervical lordosis is evident. No prevertebral soft tissue swelling. IMPRESSION: 1. Stable CT evaluation of the head. No new or acute intracranial abnormality. 2. Atrophy with chronic small vessel white matter ischemic disease. 3. Extra-axial left frontal calcification is unchanged, likely related to meningioma. 4. 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 5. Old right inferior orbital wall blowout fracture. 6. Degenerative changes in the cervical spine without acute fracture. Electronically Signed   By: Misty Stanley M.D.   On: 12/16/2015 15:31   Ct Cervical Spine Wo Contrast  Result Date: 12/16/2015 CLINICAL DATA:  Unwitnessed fall at home. Bilateral facial bruising. EXAM: CT HEAD WITHOUT CONTRAST CT CERVICAL SPINE WITHOUT CONTRAST TECHNIQUE: Multidetector CT imaging of the head and cervical spine was performed following the standard protocol without intravenous contrast. Multiplanar CT image reconstructions of the cervical spine were also generated. COMPARISON:  Head CT 09/13/2015 FINDINGS: CT HEAD FINDINGS There is no evidence for acute hemorrhage, hydrocephalus, mass lesion, or abnormal extra-axial fluid collection. No definite CT evidence for acute infarction.  Diffuse loss of parenchymal volume is consistent with atrophy. Patchy low attenuation in the deep hemispheric and periventricular white matter is nonspecific, but likely reflects chronic microvascular ischemic demyelination. High right posterior parietal hypodensity is compatible with stable appearing chronic infarct, potentially watershed amount. Stable appearance of small extra-axial left frontal calcification, potentially meningioma. Old right inferior orbital wall blowout fracture again noted. 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 CT CERVICAL SPINE FINDINGS Imaging was obtained from the skullbase through the T2 vertebral body. No evidence of fracture. No subluxation. Loss of disc height with endplate spurring is seen at C5-6 and C6-7. Relatively diffuse facet osteoarthritis is noted bilaterally. Straightening of the normal cervical lordosis is evident. No prevertebral soft tissue swelling. IMPRESSION: 1. Stable CT evaluation of the head. No new or acute intracranial abnormality. 2. Atrophy with chronic small vessel white matter ischemic disease. 3. Extra-axial left frontal calcification is unchanged, likely related to meningioma. 4. 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 5. Old right inferior orbital wall blowout fracture. 6. Degenerative changes in the cervical spine without acute fracture. Electronically Signed   By: Misty Stanley M.D.   On: 12/16/2015 15:31    Assessment & Plan:   Betta was seen today for tremors.  Diagnoses  and all orders for this visit:  Falls frequently -     Basic metabolic panel; Future -     CT Head Wo Contrast; Future -     Urinalysis, Routine w reflex microscopic; Future -     CBC w/Diff; Future  Hypothyroidism due to acquired atrophy of thyroid -     TSH; Future -     T4, free; Future  Osteoporosis, unspecified osteoporosis type, unspecified pathological fracture  presence  Vitamin D deficiency -     Vitamin D 1,25 dihydroxy; Future  Blunt head trauma, initial encounter -     CT Head Wo Contrast; Future  Loss of weight   I have discontinued Ms. Dutkiewicz cholecalciferol. I am also having her maintain her carbidopa-levodopa-entacapone, levothyroxine, potassium chloride SA, KLOR-CON M20, and CVS VITAMIN D.  Meds ordered this encounter  Medications  . CVS VITAMIN D 2000 units CAPS    Sig: TAKE 1 CAPSULE (2,000 UNITS TOTAL) BY MOUTH DAILY.    Refill:  3    Follow-up: Return if symptoms worsen or fail to improve.  Wilfred Lacy, NP

## 2016-07-04 NOTE — Patient Instructions (Addendum)
Go to basement for lab draw.  You will be called to schedule head CT

## 2016-07-04 NOTE — Progress Notes (Signed)
Pre visit review using our clinic review tool, if applicable. No additional management support is needed unless otherwise documented below in the visit note. 

## 2016-07-09 ENCOUNTER — Other Ambulatory Visit (INDEPENDENT_AMBULATORY_CARE_PROVIDER_SITE_OTHER): Payer: Medicare Other

## 2016-07-09 DIAGNOSIS — R296 Repeated falls: Secondary | ICD-10-CM

## 2016-07-09 DIAGNOSIS — E559 Vitamin D deficiency, unspecified: Secondary | ICD-10-CM

## 2016-07-09 DIAGNOSIS — E034 Atrophy of thyroid (acquired): Secondary | ICD-10-CM | POA: Diagnosis not present

## 2016-07-09 LAB — CBC WITH DIFFERENTIAL/PLATELET
BASOS ABS: 0 10*3/uL (ref 0.0–0.1)
Basophils Relative: 0.8 % (ref 0.0–3.0)
Eosinophils Absolute: 0 10*3/uL (ref 0.0–0.7)
Eosinophils Relative: 0.8 % (ref 0.0–5.0)
HEMATOCRIT: 39.8 % (ref 36.0–46.0)
Hemoglobin: 13.4 g/dL (ref 12.0–15.0)
LYMPHS ABS: 1.1 10*3/uL (ref 0.7–4.0)
LYMPHS PCT: 30.7 % (ref 12.0–46.0)
MCHC: 33.8 g/dL (ref 30.0–36.0)
MCV: 90.5 fl (ref 78.0–100.0)
MONO ABS: 0.2 10*3/uL (ref 0.1–1.0)
Monocytes Relative: 5.8 % (ref 3.0–12.0)
NEUTROS ABS: 2.2 10*3/uL (ref 1.4–7.7)
NEUTROS PCT: 61.9 % (ref 43.0–77.0)
PLATELETS: 181 10*3/uL (ref 150.0–400.0)
RBC: 4.4 Mil/uL (ref 3.87–5.11)
RDW: 13 % (ref 11.5–15.5)
WBC: 3.6 10*3/uL — ABNORMAL LOW (ref 4.0–10.5)

## 2016-07-09 LAB — URINALYSIS, ROUTINE W REFLEX MICROSCOPIC
BILIRUBIN URINE: NEGATIVE
Hgb urine dipstick: NEGATIVE
LEUKOCYTES UA: NEGATIVE
NITRITE: NEGATIVE
PH: 5 (ref 5.0–8.0)
RBC / HPF: NONE SEEN (ref 0–?)
Specific Gravity, Urine: >=1.03 — AB (ref 1.000–1.030)
Total Protein, Urine: NEGATIVE
Urine Glucose: NEGATIVE
Urobilinogen, UA: 0.2 (ref 0.0–1.0)
WBC, UA: NONE SEEN (ref 0–?)

## 2016-07-09 LAB — BASIC METABOLIC PANEL
BUN: 18 mg/dL (ref 6–23)
CALCIUM: 9.3 mg/dL (ref 8.4–10.5)
CO2: 30 mEq/L (ref 19–32)
Chloride: 108 mEq/L (ref 96–112)
Creatinine, Ser: 0.75 mg/dL (ref 0.40–1.20)
GFR: 96.18 mL/min (ref >60.00)
Glucose, Bld: 91 mg/dL (ref 70–99)
Potassium: 4.7 mEq/L (ref 3.5–5.1)
SODIUM: 142 meq/L (ref 135–145)

## 2016-07-09 LAB — T4, FREE: FREE T4: 1.37 ng/dL (ref 0.60–1.60)

## 2016-07-09 LAB — TSH: TSH: 0.05 u[IU]/mL — AB (ref 0.35–4.50)

## 2016-07-11 ENCOUNTER — Other Ambulatory Visit: Payer: Medicare Other

## 2016-07-11 LAB — VITAMIN D 1,25 DIHYDROXY
VITAMIN D3 1, 25 (OH): 36 pg/mL
Vitamin D 1, 25 (OH)2 Total: 36 pg/mL (ref 18–72)

## 2016-07-12 NOTE — Progress Notes (Signed)
Normal results, see office note

## 2016-07-23 ENCOUNTER — Ambulatory Visit
Admission: RE | Admit: 2016-07-23 | Discharge: 2016-07-23 | Disposition: A | Discharge status: Home / Self Care | Payer: Medicare Other | Source: Ambulatory Visit | Attending: Nurse Practitioner | Admitting: Nurse Practitioner

## 2016-07-23 DIAGNOSIS — S098XXA Other specified injuries of head, initial encounter: Secondary | ICD-10-CM

## 2016-07-23 DIAGNOSIS — S0990XA Unspecified injury of head, initial encounter: Secondary | ICD-10-CM | POA: Diagnosis not present

## 2016-07-23 DIAGNOSIS — R296 Repeated falls: Secondary | ICD-10-CM

## 2016-08-02 ENCOUNTER — Other Ambulatory Visit: Payer: Self-pay | Admitting: Internal Medicine

## 2016-09-14 ENCOUNTER — Telehealth: Payer: Self-pay | Admitting: Internal Medicine

## 2016-09-14 NOTE — Telephone Encounter (Signed)
Tried to call back and inform that a PA would probably be needed. Forwarding to PCP to advise.   Changing potassium to an easier form that patient might be able to swallow.

## 2016-09-14 NOTE — Telephone Encounter (Signed)
Daughter called in and said that pt is not able to take those huge potassium pill.  Is there anyway it can be change to something smaller easier to swollow?

## 2016-09-15 ENCOUNTER — Other Ambulatory Visit: Payer: Self-pay | Admitting: Internal Medicine

## 2016-09-18 ENCOUNTER — Other Ambulatory Visit: Payer: Self-pay | Admitting: Internal Medicine

## 2016-09-18 ENCOUNTER — Telehealth: Payer: Self-pay | Admitting: Internal Medicine

## 2016-09-18 NOTE — Telephone Encounter (Signed)
Her recent K+ level was a little high so she can stop taking the K+ supplement

## 2016-09-18 NOTE — Telephone Encounter (Signed)
Called daughter to inform her that it is ok if mother stops taking the K+.

## 2016-09-18 NOTE — Telephone Encounter (Signed)
Pts daughter called stating her mother is having trouble swallowing her Potasium pill, still has trouble when she cuts it in half and she wont take it dissolved. Wants to know if there is anything else they an do.

## 2016-11-08 ENCOUNTER — Encounter (HOSPITAL_COMMUNITY): Payer: Self-pay | Admitting: Emergency Medicine

## 2016-11-08 ENCOUNTER — Emergency Department (HOSPITAL_COMMUNITY): Payer: Medicare Other

## 2016-11-08 ENCOUNTER — Emergency Department (HOSPITAL_COMMUNITY)
Admission: EM | Admit: 2016-11-08 | Discharge: 2016-11-08 | Disposition: A | Discharge status: Home / Self Care | Payer: Medicare Other | Attending: Emergency Medicine | Admitting: Emergency Medicine

## 2016-11-08 DIAGNOSIS — Z79899 Other long term (current) drug therapy: Secondary | ICD-10-CM | POA: Insufficient documentation

## 2016-11-08 DIAGNOSIS — E039 Hypothyroidism, unspecified: Secondary | ICD-10-CM | POA: Diagnosis not present

## 2016-11-08 DIAGNOSIS — S0191XA Laceration without foreign body of unspecified part of head, initial encounter: Secondary | ICD-10-CM | POA: Diagnosis not present

## 2016-11-08 DIAGNOSIS — S199XXA Unspecified injury of neck, initial encounter: Secondary | ICD-10-CM | POA: Diagnosis not present

## 2016-11-08 DIAGNOSIS — M542 Cervicalgia: Secondary | ICD-10-CM | POA: Diagnosis not present

## 2016-11-08 DIAGNOSIS — I10 Essential (primary) hypertension: Secondary | ICD-10-CM | POA: Insufficient documentation

## 2016-11-08 DIAGNOSIS — S0990XA Unspecified injury of head, initial encounter: Secondary | ICD-10-CM | POA: Diagnosis not present

## 2016-11-08 DIAGNOSIS — G20 Parkinson's disease: Secondary | ICD-10-CM | POA: Diagnosis not present

## 2016-11-08 DIAGNOSIS — Y999 Unspecified external cause status: Secondary | ICD-10-CM | POA: Diagnosis not present

## 2016-11-08 DIAGNOSIS — W228XXA Striking against or struck by other objects, initial encounter: Secondary | ICD-10-CM | POA: Diagnosis not present

## 2016-11-08 DIAGNOSIS — R51 Headache: Secondary | ICD-10-CM | POA: Diagnosis not present

## 2016-11-08 DIAGNOSIS — Y929 Unspecified place or not applicable: Secondary | ICD-10-CM | POA: Diagnosis not present

## 2016-11-08 DIAGNOSIS — Z853 Personal history of malignant neoplasm of breast: Secondary | ICD-10-CM | POA: Insufficient documentation

## 2016-11-08 DIAGNOSIS — Y939 Activity, unspecified: Secondary | ICD-10-CM | POA: Diagnosis not present

## 2016-11-08 LAB — COMPREHENSIVE METABOLIC PANEL
ALK PHOS: 61 U/L (ref 38–126)
AST: 45 U/L — AB (ref 15–41)
Albumin: 4.1 g/dL (ref 3.5–5.0)
Anion gap: 8 (ref 5–15)
BILIRUBIN TOTAL: 1.7 mg/dL — AB (ref 0.3–1.2)
BUN: 17 mg/dL (ref 6–20)
CALCIUM: 9.1 mg/dL (ref 8.9–10.3)
CHLORIDE: 108 mmol/L (ref 101–111)
CO2: 29 mmol/L (ref 22–32)
CREATININE: 0.99 mg/dL (ref 0.44–1.00)
GFR, EST NON AFRICAN AMERICAN: 53 mL/min — AB (ref >60)
Glucose, Bld: 123 mg/dL — ABNORMAL HIGH (ref 65–99)
Potassium: 4.6 mmol/L (ref 3.5–5.1)
Sodium: 145 mmol/L (ref 135–145)
TOTAL PROTEIN: 6.8 g/dL (ref 6.5–8.1)

## 2016-11-08 LAB — CBC
HCT: 39.6 % (ref 36.0–46.0)
Hemoglobin: 12.9 g/dL (ref 12.0–15.0)
MCH: 30.7 pg (ref 26.0–34.0)
MCHC: 32.6 g/dL (ref 30.0–36.0)
MCV: 94.3 fL (ref 78.0–100.0)
PLATELETS: 206 10*3/uL (ref 150–400)
RBC: 4.2 MIL/uL (ref 3.87–5.11)
RDW: 12.7 % (ref 11.5–15.5)
WBC: 4.6 10*3/uL (ref 4.0–10.5)

## 2016-11-08 LAB — URINALYSIS, ROUTINE W REFLEX MICROSCOPIC
BILIRUBIN URINE: NEGATIVE
Glucose, UA: NEGATIVE mg/dL
Hgb urine dipstick: NEGATIVE
KETONES UR: 5 mg/dL — AB
LEUKOCYTES UA: NEGATIVE
NITRITE: NEGATIVE
PH: 6 (ref 5.0–8.0)
Protein, ur: NEGATIVE mg/dL
Specific Gravity, Urine: 1.017 (ref 1.005–1.030)

## 2016-11-08 LAB — CBG MONITORING, ED: GLUCOSE-CAPILLARY: 117 mg/dL — AB (ref 65–99)

## 2016-11-08 NOTE — ED Provider Notes (Signed)
[facility] DEPT Provider Note   CSN: [id number] Arrival date & time: 11/08/16  1412     History   Chief Complaint Chief Complaint  Patient presents with  . Head Injury  . Altered Mental Status    HPI [name] is a 76 y.o. female.  76 year old female who presents with a laceration to occiput after she had mechanical problems prior to arrival. Patient does have a history of Parkinson's disease as well as some baseline confusion and a daughter states that she was naked and today when she lost her balance and struck her head. She does normally use a cane to ambulate. There is no reported loss of consciousness. Since that time daughter states that she appears be more confused. Denies any vomiting. No medications were given after she fell. Patient's mental status has gradually improved. Denies any recent fever, vomiting, diarrhea. Does states that she has been somewhat more somnolent over the last few days. No recent changes to her medication regimen.      Past Medical History:  Diagnosis Date  . Abnormal chest x-ray   . Anxiety   . Breast cancer ([place])    stage 0 left  . Diverticulosis of colon   . DJD (degenerative joint disease)   . Glaucoma   . Hip pain   . Hypercholesteremia   . Hypertension   . Hypothyroid   . Lumbar back pain   . Osteopenia   . Parkinson disease ([place])   . Peripheral vascular disease ([place])   . Venous insufficiency   . Vitamin D deficiency     Patient Active Problem List   Diagnosis Date Noted  . Falls frequently 08/05/2015  . Orthostatic hypotension 08/05/2015  . Epistaxis 08/05/2015  . Urolithiasis 04/21/2015  . Osteoporosis 04/18/2015  . Osteoporosis of lower leg 04/04/2015  . Hypokalemia 01/13/2015  . Routine general medical examination at a health care facility 09/14/2014  . Parkinson's disease ([place]) 10/30/2012  . Mild dementia 10/30/2012  . Stenosis, spinal, lumbar 05/19/2012  . Breast cancer, left breast ([place]) 05/22/2010  .  Vitamin D deficiency 04/26/2008  . GLAUCOMA 11/09/2007  . HYPERCHOLESTEROLEMIA 10/27/2007  . Hypothyroidism 05/01/2007  . Essential hypertension 05/01/2007  . DEGENERATIVE JOINT DISEASE 05/01/2007    Past Surgical History:  Procedure Laterality Date  . BREAST LUMPECTOMY  8/11 by [facility]  . cataract surgery and argon laser trabeculoplasty  04/2010   Dr. Herbert Deaner  . CHOLECYSTECTOMY    . COLONOSCOPY    . INCISE AND DRAIN ABCESS  10/04/11   sebaceous cyst on back  . lapraoscopic cholecystectomy  09/2003   Dr. Dalbert Batman  . lumbar laminectomy and foraminotomies for spinal stenosis  11/2005   Dr. Gladstone Lighter  . LUMBAR LAMINECTOMY/DECOMPRESSION MICRODISCECTOMY  05/20/2012   Procedure: LUMBAR LAMINECTOMY/DECOMPRESSION MICRODISCECTOMY 2 LEVELS;  Surgeon: Tobi Bastos, MD;  Location: [facility] ORS;  Service: Orthopedics;  Laterality: N/A;  . OVARIAN CYST REMOVAL    . POLYPECTOMY      OB History    No data available       Home Medications    Prior to Admission medications   Medication Sig Start Date End Date Taking? Authorizing Provider  carbidopa-levodopa-entacapone (STALEVO) 25-100-200 MG tablet TAKE 1 TABLET BY MOUTH 4 (FOUR) TIMES DAILY. 02/28/16   Penumalli, Earlean Polka, MD  CVS VITAMIN D 2000 units CAPS TAKE 1 CAPSULE (2,000 UNITS TOTAL) BY MOUTH DAILY. 05/24/16   [provider]  CVS VITAMIN D 2000 units CAPS TAKE 1 CAPSULE (2,000 UNITS TOTAL)  BY MOUTH DAILY. 09/17/16   Biagio Borg, MD  levothyroxine (SYNTHROID, LEVOTHROID) 88 MCG tablet TAKE 1 TABLET BY MOUTH EVERY DAY 09/16/16   Janith Lima, MD    Family History Family History  Problem Relation Age of Onset  . Colon cancer Father   . Osteoporosis Neg Hx     Social History Social History  Substance Use Topics  . Smoking status: Never Smoker  . Smokeless tobacco: Never Used  . Alcohol use No     Allergies   Patient has no known allergies.   Review of Systems Review of Systems  All other systems reviewed and  are negative.    Physical Exam Updated Vital Signs BP (!) 116/97 (BP Location: Left Arm)   Pulse 65   Temp 98.5 F (36.9 C) (Oral)   Resp 18   SpO2 99%   Physical Exam  Constitutional: She is oriented to person, place, and time. She appears well-developed and well-nourished.  Non-toxic appearance. No distress.  HENT:  Head: Normocephalic. Head is with laceration.    Eyes: Conjunctivae, EOM and lids are normal. Pupils are equal, round, and reactive to light.  Neck: Normal range of motion. Neck supple. No tracheal deviation present. No thyroid mass present.  Cardiovascular: Normal rate, regular rhythm and normal heart sounds.  Exam reveals no gallop.   No murmur heard. Pulmonary/Chest: Effort normal and breath sounds normal. No stridor. No respiratory distress. She has no decreased breath sounds. She has no wheezes. She has no rhonchi. She has no rales.  Abdominal: Soft. Normal appearance and bowel sounds are normal. She exhibits no distension. There is no tenderness. There is no rebound and no CVA tenderness.  Musculoskeletal: Normal range of motion. She exhibits no edema or tenderness.  Neurological: She is alert and oriented to person, place, and time. She has normal strength. No cranial nerve deficit or sensory deficit. GCS eye subscore is 4. GCS verbal subscore is 5. GCS motor subscore is 6.  Skin: Skin is warm and dry. No abrasion and no rash noted.  Psychiatric: Her affect is blunt. Her speech is delayed. She is withdrawn.  Nursing note and vitals reviewed.    ED Treatments / Results  Labs (all labs ordered are listed, but only abnormal results are displayed) Labs Reviewed  CBG MONITORING, ED - Abnormal; Notable for the following:       Result Value   Glucose-Capillary 117 (*)    All other components within normal limits  URINE CULTURE  CBC  COMPREHENSIVE METABOLIC PANEL  URINALYSIS, ROUTINE W REFLEX MICROSCOPIC    EKG  EKG Interpretation  Date/Time:  Thursday  Nov 08 2016 14:59:43 EDT Ventricular Rate:  72 PR Interval:    QRS Duration: 86 QT Interval:  385 QTC Calculation: 422 R Axis:   -43 Text Interpretation:  Sinus rhythm Inferior infarct, old Consider anterior infarct No STEMI.  Confirmed by Nanda Quinton [phone]) on 11/08/2016 3:07:57 PM       Radiology No results found.  Procedures Procedures (including critical care time)  Medications Ordered in ED Medications - No data to display   Initial Impression / Assessment and Plan / ED Course  I have reviewed the triage vital signs and the nursing notes.  Pertinent labs & imaging results that were available during my care of the patient were reviewed by me and considered in my medical decision making (see chart for details).     Patient not suturable wound. Labs and x-rays were reassuring.  She is much more alert at this time. Family feels comfortable take her home. Final Clinical Impressions(s) / ED Diagnoses   Final diagnoses:  None    New Prescriptions New Prescriptions   No medications on file     Lacretia Leigh, MD 11/08/16 1920

## 2016-11-08 NOTE — ED Triage Notes (Signed)
Patient here from home with complaints of head injury after fall at 1 pm today. Laceration noted to back of head with swelling. Hx of Parkinsons. Daughter reports that patient is altered and confused more than normal.

## 2016-11-08 NOTE — ED Notes (Signed)
MD made aware patient became combative during in and out cath attempt. Patient given ginger ale to attempt bedpan use.

## 2016-11-08 NOTE — ED Notes (Signed)
Patient bladder scanned, 112 ml noted in bladder

## 2016-11-08 NOTE — ED Notes (Signed)
Patient transported to CT 

## 2016-11-08 NOTE — ED Notes (Signed)
Attempted to In and Out cath patient to retrieve sample. Pt was combative, when catheter was inserted, there was no urine return

## 2016-11-08 NOTE — ED Notes (Signed)
ED Provider at bedside. 

## 2016-11-10 LAB — URINE CULTURE

## 2016-12-03 NOTE — Progress Notes (Signed)
CLINIC:  Survivorship   REASON FOR VISIT:  Routine follow-up for history of breast cancer.   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 Horsford presents to the [facility] today for routine follow-up for her history of breast cancer.  She is here with her daughter Malachy Mood today.  Omnia has been steadily losing weight over the past year.  In march, 2017 she was 98 pounds and today she is 81 pounds.  Her appetite has just recently started changing.  Her most recent fall her daughter attributes to the decreased appetite.  She is currently eating about 50% of breakfast lunch and dinner, she drinks 1-2 ensures per day as well.  She has longstanding h/o Parkinsons and she has fallen more recently.  She lives with her husband and she also has an aid who comes in for four hours on Monday through Friday.  She has a good support system with her daughter Malachy Mood living 10 miles away and her son Pieter Partridge who lives in [place] who can drive over if needed.  She is not experiencing any pain.  She does occasionally have a large watery stool.  But it has only happened once in the past week or so.  She does have a h/o hypothyroidism, and most recently her TSH became stable in January, 2018.  She was supposed to have it rechecked in April, however has not.      REVIEW OF SYSTEMS:  Review of Systems  Constitutional: Positive for unexpected weight change. Negative for appetite change, chills, diaphoresis, fatigue and fever.  HENT:   Negative for hearing loss and lump/mass.   Eyes: Positive for eye problems (has eye appt at end of next week). Negative for icterus.  Respiratory: Negative for chest tightness, cough and  shortness of breath.   Cardiovascular: Negative for chest pain, leg swelling and palpitations.  Gastrointestinal: Positive for diarrhea (as per hpi). Negative for abdominal distention, abdominal pain, nausea and vomiting.  Endocrine: Negative for hot flashes.  Genitourinary: Negative for difficulty urinating.   Hematological: Negative for adenopathy. Does not bruise/bleed easily.  Breast: Denies any new nodularity, masses, tenderness, nipple changes, or nipple discharge.       PAST MEDICAL/SURGICAL HISTORY:  Past Medical History:  Diagnosis Date  . Abnormal chest x-ray   . Anxiety   . Breast cancer ([place])    stage 0 left  . Diverticulosis of colon   . DJD (degenerative joint disease)   . Glaucoma   . Hip pain   . Hypercholesteremia   . Hypertension   . Hypothyroid   . Lumbar back pain   . Osteopenia   . Parkinson disease ([place])   . Peripheral vascular disease ([place])   . Venous insufficiency   . Vitamin D deficiency    Past Surgical History:  Procedure Laterality Date  . BREAST LUMPECTOMY  8/11 by DrWakefield  . cataract surgery and argon laser trabeculoplasty  04/2010   Dr. Herbert Deaner  . CHOLECYSTECTOMY    . COLONOSCOPY    . INCISE AND DRAIN ABCESS  10/04/11   sebaceous cyst on back  . lapraoscopic cholecystectomy  09/2003   Dr. Dalbert Batman  . lumbar laminectomy and foraminotomies for spinal stenosis  11/2005   Dr. Gladstone Lighter  . LUMBAR LAMINECTOMY/DECOMPRESSION MICRODISCECTOMY  05/20/2012   Procedure: LUMBAR LAMINECTOMY/DECOMPRESSION MICRODISCECTOMY 2 LEVELS;  Surgeon: Tobi Bastos, MD;  Location: [facility];  Service: Orthopedics;  Laterality: N/A;  . OVARIAN CYST REMOVAL    . POLYPECTOMY       ALLERGIES:  No Known Allergies   CURRENT MEDICATIONS:  Outpatient Encounter Prescriptions as of 12/04/2016  Medication Sig  . carbidopa-levodopa-entacapone (STALEVO) 25-100-200 MG tablet TAKE 1 TABLET BY MOUTH 4 (FOUR) TIMES DAILY.  . CVS VITAMIN D 2000 units CAPS TAKE 1 CAPSULE  (2,000 UNITS TOTAL) BY MOUTH DAILY.  Marland Kitchen levothyroxine (SYNTHROID, LEVOTHROID) 88 MCG tablet TAKE 1 TABLET BY MOUTH EVERY DAY   No facility-administered encounter medications on file as of 12/04/2016.      ONCOLOGIC FAMILY HISTORY:  Family History  Problem Relation Age of Onset  . Colon cancer Father   . Osteoporosis Neg Hx     GENETIC COUNSELING/TESTING: Not indicated at this time  SOCIAL HISTORY:  Odeth Bry is married and lives with her husband in [place], [place].  She has 2 children and they live in [place] and [place].  Ms. Demuro is currently retired.  She denies any current or history of tobacco, alcohol, or illicit drug use.     PHYSICAL EXAMINATION:  Vital Signs: Vitals:   12/04/16 0847  BP: (!) 129/52  Pulse: (!) 57  Resp: 17  Temp: 98.1 F (36.7 C)   Filed Weights   12/04/16 0847  Weight: 81 lb 14.4 oz (37.1 kg)   General: Well-nourished, well-appearing female in no acute distress.  Accompanied by her daughter today.   HEENT: Head is normocephalic.  Pupils equal and reactive to light. Conjunctivae clear without exudate.  Sclerae anicteric. Oral mucosa is pink, moist.  Oropharynx is pink without lesions or erythema.  Lymph: No cervical, supraclavicular, or infraclavicular lymphadenopathy noted on palpation.  Cardiovascular: Regular rate and rhythm.Marland Kitchen Respiratory: Clear to auscultation bilaterally. Chest expansion symmetric; breathing non-labored.  Breast Exam:  -Left breast: No appreciable masses on palpation. No skin redness, thickening, or peau d'orange appearance; no nipple retraction or nipple discharge; mild distortion in symmetry at previous lumpectomy site well healed scar without erythema or nodularity.  -Right breast: No appreciable masses on palpation. No skin redness, thickening, or peau d'orange appearance; no nipple retraction or nipple discharge;  -Axilla: No axillary adenopathy bilaterally. + right axillary sebaceous cyst noted  GI:  Abdomen soft and round; non-tender, non-distended. Bowel sounds normoactive. No hepatosplenomegaly.   GU: Deferred.  Neuro: No focal deficits. Steady gait.  Psych: Mood and affect normal and appropriate for situation.  Extremities: No edema. Skin: Warm and dry.  LABORATORY DATA:  None for this visit   DIAGNOSTIC IMAGING:  Most recent mammogram:      ASSESSMENT AND PLAN:  Ms.. Purington is a pleasant 76 y.o. 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  She declined Tamoxifen.  [facility] for [facility] and routine follow-up.   1. History of breast cancer:  Ms. Whitcomb is currently clinically and radiographically without evidence of disease or recurrence of breast cancer. She will be due for mammogram in 02/2017; orders placed today.    I encouraged her to call me with any questions or concerns before her next visit at the cancer center, and  I would be happy to see her sooner, if needed.    2. Weight loss:  This is concerning, however is likely multifactorial considering her other comorbidities.  She has not had her labs tested in a few months, particularly her TSH.  Her last TSH was in 06/2016 and demonstrated a low level of 0.05.  Since she is here today, we will repeat some lab work and send off to her PCP once we get the results.    3. Bone health:  Given Ms. Godley age, history of breast cancer she is at risk for bone demineralization. Her last DEXA scan was on 03/16/2015 and demonstrated osteopenia with a T score of -2.6 in the right femoral neck.   She was given education on specific food and activities to promote bone health.  I will defer to her PCP regarding bone density testing and management.   4. Cancer screening:  Due to Ms. Pae's history and her age, she should receive screening for skin cancers. She was encouraged to follow-up with her PCP for  appropriate cancer screenings.   5. Health maintenance and wellness promotion: Ms. Coull was encouraged to consume 5-7 servings of fruits and vegetables per day. She was also encouraged to engage in moderate to vigorous exercise for 30 minutes per day most days of the week. She was instructed to limit her alcohol consumption and continue to abstain from tobacco use.    Dispo:  -Return to cancer center in one year for LTS follow up -Mammogram in 02/2017 when due -Follow up with PCP as indicated   A total of (30) minutes of face-to-face time was spent with this patient with greater than 50% of that time in counseling and care-coordination.   Gardenia Phlegm, [place] [phone]   Note: PRIMARY CARE PROVIDER Janith Lima, [place] [phone]

## 2016-12-04 ENCOUNTER — Ambulatory Visit (HOSPITAL_BASED_OUTPATIENT_CLINIC_OR_DEPARTMENT_OTHER): Payer: Medicare Other | Admitting: Adult Health

## 2016-12-04 ENCOUNTER — Encounter: Payer: Medicare Other | Admitting: Nurse Practitioner

## 2016-12-04 ENCOUNTER — Other Ambulatory Visit: Payer: Self-pay | Admitting: Internal Medicine

## 2016-12-04 ENCOUNTER — Ambulatory Visit (HOSPITAL_BASED_OUTPATIENT_CLINIC_OR_DEPARTMENT_OTHER): Payer: Medicare Other

## 2016-12-04 ENCOUNTER — Encounter: Payer: Self-pay | Admitting: Adult Health

## 2016-12-04 VITALS — BP 129/52 | HR 57 | Temp 98.1°F | Resp 17 | Ht 59.0 in | Wt 81.9 lb

## 2016-12-04 DIAGNOSIS — E034 Atrophy of thyroid (acquired): Secondary | ICD-10-CM

## 2016-12-04 DIAGNOSIS — Z170 Estrogen receptor positive status [ER+]: Principal | ICD-10-CM

## 2016-12-04 DIAGNOSIS — R634 Abnormal weight loss: Secondary | ICD-10-CM | POA: Diagnosis not present

## 2016-12-04 DIAGNOSIS — Z853 Personal history of malignant neoplasm of breast: Secondary | ICD-10-CM

## 2016-12-04 DIAGNOSIS — Z1239 Encounter for other screening for malignant neoplasm of breast: Secondary | ICD-10-CM

## 2016-12-04 DIAGNOSIS — C50912 Malignant neoplasm of unspecified site of left female breast: Secondary | ICD-10-CM

## 2016-12-04 LAB — COMPREHENSIVE METABOLIC PANEL
ALBUMIN: 3.9 g/dL (ref 3.5–5.0)
ALK PHOS: 65 U/L (ref 40–150)
ALT: <6 U/L (ref 0–55)
AST: 17 U/L (ref 5–34)
Anion Gap: 7 mEq/L (ref 3–11)
BUN: 18.1 mg/dL (ref 7.0–26.0)
CO2: 30 meq/L — AB (ref 22–29)
Calcium: 9.7 mg/dL (ref 8.4–10.4)
Chloride: 107 mEq/L (ref 98–109)
Creatinine: 0.8 mg/dL (ref 0.6–1.1)
EGFR: 78 mL/min/{1.73_m2} — AB (ref >90)
GLUCOSE: 91 mg/dL (ref 70–140)
POTASSIUM: 3.9 meq/L (ref 3.5–5.1)
SODIUM: 145 meq/L (ref 136–145)
Total Bilirubin: 0.89 mg/dL (ref 0.20–1.20)
Total Protein: 6.6 g/dL (ref 6.4–8.3)

## 2016-12-04 LAB — CBC WITH DIFFERENTIAL/PLATELET
BASO%: 1.6 % (ref 0.0–2.0)
BASOS ABS: 0.1 10*3/uL (ref 0.0–0.1)
EOS%: 0.8 % (ref 0.0–7.0)
Eosinophils Absolute: 0 10*3/uL (ref 0.0–0.5)
HCT: 39.2 % (ref 34.8–46.6)
HEMOGLOBIN: 13 g/dL (ref 11.6–15.9)
LYMPH%: 29 % (ref 14.0–49.7)
MCH: 30.8 pg (ref 25.1–34.0)
MCHC: 33.1 g/dL (ref 31.5–36.0)
MCV: 93 fL (ref 79.5–101.0)
MONO#: 0.2 10*3/uL (ref 0.1–0.9)
MONO%: 6.7 % (ref 0.0–14.0)
NEUT%: 61.9 % (ref 38.4–76.8)
NEUTROS ABS: 2.1 10*3/uL (ref 1.5–6.5)
Platelets: 180 10*3/uL (ref 145–400)
RBC: 4.22 10*6/uL (ref 3.70–5.45)
RDW: 12.6 % (ref 11.2–14.5)
WBC: 3.4 10*3/uL — AB (ref 3.9–10.3)
lymph#: 1 10*3/uL (ref 0.9–3.3)

## 2016-12-04 LAB — TSH: TSH: <0.08 m(IU)/L — ABNORMAL LOW (ref 0.308–3.960)

## 2016-12-04 NOTE — Progress Notes (Signed)
Pt had a fall recently and was seen in ED 10/2016. She was treated for a laceration to the head. Pt now walks with a cane.

## 2016-12-05 LAB — T4, FREE: T4,Free(Direct): 2.17 ng/dL — ABNORMAL HIGH (ref 0.82–1.77)

## 2016-12-19 ENCOUNTER — Ambulatory Visit (INDEPENDENT_AMBULATORY_CARE_PROVIDER_SITE_OTHER): Payer: Medicare Other | Admitting: Diagnostic Neuroimaging

## 2016-12-19 ENCOUNTER — Encounter: Payer: Self-pay | Admitting: Diagnostic Neuroimaging

## 2016-12-19 VITALS — BP 136/69 | HR 54 | Wt 81.0 lb

## 2016-12-19 DIAGNOSIS — G20 Parkinson's disease: Secondary | ICD-10-CM

## 2016-12-19 DIAGNOSIS — R441 Visual hallucinations: Secondary | ICD-10-CM

## 2016-12-19 DIAGNOSIS — R269 Unspecified abnormalities of gait and mobility: Secondary | ICD-10-CM

## 2016-12-19 DIAGNOSIS — F0281 Dementia in other diseases classified elsewhere with behavioral disturbance: Secondary | ICD-10-CM

## 2016-12-19 MED ORDER — CARBIDOPA-LEVODOPA-ENTACAPONE 25-100-200 MG PO TABS: 1.0000 | ORAL_TABLET | Freq: Three times a day (TID) | ORAL | 4 refills | Status: DC

## 2016-12-19 NOTE — Progress Notes (Signed)
[facility]  PATIENT: [name] DOB: 1940/06/28  REFERRING CLINICIAN:  HISTORY FROM: patient and daughter REASON FOR VISIT: follow up   HISTORICAL  CHIEF COMPLAINT:  Chief Complaint  Patient presents with  . Parkinson's disease    rm 6, dgtr- Cheryl, "loss of appetite, falls -legs lock up on her"  . Follow-up    one year    HISTORY OF PRESENT ILLNESS:   UPDATE 12/19/16: Since last visit, sxs progressing. Patient able to escape home and wander down street last month. Having more falls. Went to ER for fall and occipital hematoma. Now using cane more ofter. Hallucinations are non-threatening and stable (seeing children). Tolerating meds.   UPDATE 12/20/15: Since last visit, more falls (including last Friday). Has aid 4 hours x 3 days per week. Tolerating meds, but difficult to take 4 x per day (stalevo) due to sleep and eating schedule. No wearing off per family.   UPDATE 11/16/14: Since last visit, still falling, more memory problems. Tolerating meds. Sitter comes to home 3 hrs per week. Pt hs 24 hr supervision (mainly by husband). He has looked into adult daycare center nearby their home.  UPDATE 05/18/14: Since last visit, many more falls, usually when patient is alone and tries to walk down steps or take a shower unsupervised. Husband has sitter now when he needs to go out. Not much benefit with azilect. Tremor under control. Some head nodding behavior.   UPDATE 02/22/14: Since last visit, more falls. No warning. Has occurred with getting out of bed, bending over, making the bed. Husband hears a "thud" in the other room and finds her on the floor, no confusion or LOC. Hallucinations stable. No wearing off except in early AM before 1st dose stalevo.   UPDATE 12/11/13: Since last visit, was stable until 3 weeks ago. Now with more confusion, intermittent, sometimes not recognizing her family. Sometimes she [place] is [place] the bus [place], even though she is  standing in her home. No fevers, chills, cough, diarrhea, pain, SOB, CP, N, V. Non threatening visual hallucinations are stable. Memory worsening. Could not tolerate exelon patch (slightly paranoid about its effects).   UPDATE 10/30/12: Since last visit patient is stable. Reduced dosing of Stalevo seems to have improved her hallucinations, but her handwriting has worsened. She still sees children and people in her home, but according to the husband this is much less frequent. He has had to adjust some of the objects in his home which were bothering her. For example plants or water heaters high up in the room, or radiators on the floor, seem to bother her more. Once he removed these from her view she felt better. Tremor, gait and balance are stable. No falls. She does have more stooped posture than before but this seems to be stable since her back surgery. She is having some or drooling. She's having more runny nose. She keeps a napkin with her to wipe her nose. Patient's husband reports more short-term memory loss than before.  UPDATE 09/05/12: Since last visit, has reduced stalevo to QID. no change in hallucinations or parkinson's symptoms. until 1 month ago, some progression of confusion, memory, hallucinations and paranoia. this fluctuates throughout the day.  UPDATE 07/07/12: Since last visit, tried seroquel x 2 weeks, but hallucinations worsened. Now off seroquel. Also had low back surgery (nov 2013), and went to rehab. Now back home. Stalevo dosing stable.  UPDATE 04/16/12: Never picked up seroquel after last visit. she didn't  know that i rx'd this, even though we discussed it. will try rx again. hallucinations are stable. some more stiffness in hips lately. on 5 tabs stalevo per day.  UPDATE 08/14/11:  Continues to have hallucinations, mostly at night, denies being afraid but feels they are bothersome.  Has not thought about calling 911.  Realizes they are hallucinations.  Took Stalevo 4 times per day  for one week without improvement of hallucinations, no change in parkinson's symptoms nowback up to 5 times a day.  She is interested in trying other medications for relief.  Also verbalizes concerns of choking episodes in her sleep.  Occurs approximately 3-4 times per month.  Denies choking episodes with eating or drinking.  PRIOR HPI: 76 year old female with history of hypothyroidism, hypertension, peripheral vascular disease presenting for evaluation and management of Parkinson's disease.  She is accompanied by her husband. In 2004 patient began to develop tremor in her bilateral upper extremities. She was diagnosed with Parkinson's disease. She was initially treated with Mirapex and Sinemet by Dr. Jacolyn Reedy ([place]).  She began to develop hallucinations and was ultimately changed to Stalevo by Dr. Marzetta Board [facility]).  She has done fairly well on this medication. She takes Stalevo 37.5/150/200, 1 tab five times a day (7am, 11am, 3pm, 7pm, bedtime).  She notices wearing off around 4 hours after her dose. She has not had any falls. She denies hallucinations or constipation.  She would like to re-establish care with a local neurologist.   REVIEW OF SYSTEMS: Full 14 system review of systems performed and negative except for: mild hallucinations falling drooling activity change diarrhea walking diff speech diff memory loss.     ALLERGIES: No Known Allergies  HOME MEDICATIONS: Outpatient Medications Prior to Visit  Medication Sig Dispense Refill  . carbidopa-levodopa-entacapone (STALEVO) 25-100-200 MG tablet TAKE 1 TABLET BY MOUTH 4 (FOUR) TIMES DAILY. 120 tablet 11  . CVS VITAMIN D 2000 units CAPS TAKE 1 CAPSULE (2,000 UNITS TOTAL) BY MOUTH DAILY. 90 capsule 3   No facility-administered medications prior to visit.     PAST MEDICAL HISTORY: Past Medical History:  Diagnosis Date  . Abnormal chest x-ray   . Anxiety   . Breast cancer ([place])    stage 0 left  . Diverticulosis of colon   . DJD  (degenerative joint disease)   . Glaucoma   . Hip pain   . Hypercholesteremia   . Hypertension   . Hypothyroid   . Lumbar back pain   . Osteopenia   . Parkinson disease ([place])   . Peripheral vascular disease ([place])   . Venous insufficiency   . Vitamin D deficiency     PAST SURGICAL HISTORY: Past Surgical History:  Procedure Laterality Date  . BREAST LUMPECTOMY  8/11 by DrWakefield  . cataract surgery and argon laser trabeculoplasty  04/2010   Dr. Herbert Deaner  . CHOLECYSTECTOMY    . COLONOSCOPY    . INCISE AND DRAIN ABCESS  10/04/11   sebaceous cyst on back  . lapraoscopic cholecystectomy  09/2003   Dr. Dalbert Batman  . lumbar laminectomy and foraminotomies for spinal stenosis  11/2005   Dr. Gladstone Lighter  . LUMBAR LAMINECTOMY/DECOMPRESSION MICRODISCECTOMY  05/20/2012   Procedure: LUMBAR LAMINECTOMY/DECOMPRESSION MICRODISCECTOMY 2 LEVELS;  Surgeon: Tobi Bastos, MD;  Location: [facility];  Service: Orthopedics;  Laterality: N/A;  . OVARIAN CYST REMOVAL    . POLYPECTOMY      FAMILY HISTORY: Family History  Problem Relation Age of Onset  . Colon cancer Father   .  Osteoporosis Neg Hx     SOCIAL HISTORY:  Social History   Social History  . Marital status: Married    Spouse name: [name]  . Number of children: 2  . Years of education: Assoc   Occupational History  . retired   .  Retired   Social History Main Topics  . Smoking status: Never Smoker  . Smokeless tobacco: Never Used  . Alcohol use No  . Drug use: No  . Sexual activity: Not Currently   Other Topics Concern  . Not on file   Social History Narrative   Pt lives at home with spouse. 12/19/16 aid every day for 4 hours   Caffeine Use: Very little; once a week     PHYSICAL EXAM  Vitals:   12/19/16 0822  BP: 136/69  Pulse: (!) 54  Weight: 81 lb (36.7 kg)   Body mass index is 16.36 kg/m.  Wt Readings from Last 3 Encounters:  12/19/16 81 lb (36.7 kg)  12/04/16 81 lb 14.4 oz (37.1 kg)  07/04/16 79 lb 12 oz (36.2  kg)    EXAM:  General: Patient is awake, alert and in no acute distress.  Well developed and groomed. Neck is supple. Cardiovascular: No carotid artery bruits.  Heart is regular rate and rhythm with no murmurs.  Neurologic Exam  Mental Status: Awake, alert. QUIET. DECREASED FLUENCY. CALM. Comprehension intact. POSITIVE SNOUT AND MYERSONS REFLEXES Cranial Nerves: Pupils are POST-SURGICAL. Visual fields are full to confrontation.  Conjugate eye movements are full and symmetric.  Facial sensation and strength are symmetric.  Hearing is intact.  Palate elevated symmetrically and uvula is midline.  Shoulder shug is symmetric.  Tongue is midline.  MASKED FACIES.  POSITIVE MYERSON'S SIGN. SOFT HOARSE VOICE.  Motor: Normal bulk and MOD COGWHEELING IN BUE. NO TREMOR. MODERATE BRADYKINESIA IN BUE AND BLE (LEFT WORSE THAN RIGHT). Full strength in the upper and lower extremities.  No pronator drift. Sensory: Intact and symmetric to light touch. Coordination: No ataxia or dysmetria on finger-nose or rapid alternating movement testing.  SLOW RAM. Reflexes: Deep tendon reflexes in the upper and lower extremity are TRACE and symmetric. Gait and Station: LEANING FORWARD. STOOPED POSTURE. SLOW SHORT STEPS. USES SINGLE POINT CANE.      DIAGNOSTIC DATA (LABS, IMAGING, TESTING) - I reviewed patient records, labs, notes, testing and imaging myself where available.  Lab Results  Component Value Date   WBC 3.4 (L) 12/04/2016   HGB 13.0 12/04/2016   HCT 39.2 12/04/2016   MCV 93.0 12/04/2016   PLT 180 12/04/2016      Component Value Date/Time   NA 145 12/04/2016 0947   K 3.9 12/04/2016 0947   CL 108 11/08/2016 1505   CL 107 12/08/2012 0926   CO2 30 (H) 12/04/2016 0947   GLUCOSE 91 12/04/2016 0947   GLUCOSE 123 (H) 12/08/2012 0926   BUN 18.1 12/04/2016 0947   CREATININE 0.8 12/04/2016 0947   CALCIUM 9.7 12/04/2016 0947   PROT 6.6 12/04/2016 0947   ALBUMIN 3.9 12/04/2016 0947   AST 17 12/04/2016  0947   ALT <6 12/04/2016 0947   ALKPHOS 65 12/04/2016 0947   BILITOT 0.89 12/04/2016 0947   GFRNONAA 53 (L) 11/08/2016 1505   GFRAA >60 11/08/2016 1505   Lab Results  Component Value Date   CHOL 176 09/13/2014   HDL 61.70 09/13/2014   LDLCALC 92 09/13/2014   LDLDIRECT 142.2 10/28/2008   TRIG 111.0 09/13/2014   CHOLHDL 3 09/13/2014  Lab Results  Component Value Date   HGBA1C 5.4 09/13/2014   Lab Results  Component Value Date   VITAMINB12 335 09/19/2015   Lab Results  Component Value Date   TSH <0.080 (L) 12/04/2016    09/09/12 MRI brain  - Mild perisylvian and moderate temporal atrophy. Moderate periventricular and subcortical chronic small vessel ischemic disease. No significant change from prior MRI on 05/08/11.  12/16/15 CT head / cervical [I reviewed images myself and agree with interpretation. -VRP]  1. Stable CT evaluation of the head. No new or acute intracranial abnormality. 2. Atrophy with chronic small vessel white matter ischemic disease. 3. Extra-axial left frontal calcification is unchanged, likely related to meningioma. 4. 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 5. Old right inferior orbital wall blowout fracture. 6. Degenerative changes in the cervical spine without acute fracture.    ASSESSMENT AND PLAN  76 y.o. female with history of hypothyroidism, hypertension, peripheral vascular disease her for evaluation and management of advanced Parkinson's disease.   Non-threatening visual hallucinations are stable.   More falls and memory problems lately. Unfortunately she did not benefit from PT.   She needs help with bathing, walking, dressing. She can feed herself to some degree, but losing weight.    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[place])  Gait difficulty  Visual hallucination    PLAN:  ADVANCED PARKINSON'S DISEASE (established problem, worsening) -  continue stalevo 25/100/200; may reduce to BID or TID dosing due to hallucincations and lack of benefit - continue palliative approach going forward; continue support via home health aid and family support - use rollator walker  DEMENTIA IN PARKINSON'S DISEASE (established problem, worsening) - palliative approach with safety and supervision  HALLUCINATIONS (established problem, stable) - did not tolerate seroquel in the past - could consider nuplazid in future if hallucinations become more bothersome  Meds ordered this encounter  Medications  . carbidopa-levodopa-entacapone (STALEVO) 25-100-200 MG tablet    Sig: Take 1 tablet by mouth 3 (three) times daily.    Dispense:  270 tablet    Refill:  4   Orders Placed This Encounter  Procedures  . For home use only DME 4 wheeled rolling walker with seat   Return in about 1 year (around 12/19/2017).     Penni Bombard, MD 03/17/3006, 6:22 AM Certified in Neurology, Neurophysiology and Neuroimaging  [facility] [address], [place], [place] [address] [phone]

## 2016-12-20 DIAGNOSIS — H401131 Primary open-angle glaucoma, bilateral, mild stage: Secondary | ICD-10-CM | POA: Diagnosis not present

## 2016-12-20 DIAGNOSIS — H35033 Hypertensive retinopathy, bilateral: Secondary | ICD-10-CM | POA: Diagnosis not present

## 2016-12-20 DIAGNOSIS — H35372 Puckering of macula, left eye: Secondary | ICD-10-CM | POA: Diagnosis not present

## 2016-12-20 DIAGNOSIS — H35363 Drusen (degenerative) of macula, bilateral: Secondary | ICD-10-CM | POA: Diagnosis not present

## 2017-01-08 ENCOUNTER — Ambulatory Visit (INDEPENDENT_AMBULATORY_CARE_PROVIDER_SITE_OTHER): Payer: Medicare Other | Admitting: Internal Medicine

## 2017-01-08 ENCOUNTER — Telehealth: Payer: Self-pay | Admitting: *Deleted

## 2017-01-08 ENCOUNTER — Encounter: Payer: Self-pay | Admitting: Internal Medicine

## 2017-01-08 ENCOUNTER — Other Ambulatory Visit (INDEPENDENT_AMBULATORY_CARE_PROVIDER_SITE_OTHER): Payer: Medicare Other

## 2017-01-08 VITALS — BP 100/60 | HR 133 | Temp 97.8°F | Resp 16 | Ht 59.0 in | Wt 79.5 lb

## 2017-01-08 DIAGNOSIS — G20 Parkinson's disease: Secondary | ICD-10-CM | POA: Diagnosis not present

## 2017-01-08 DIAGNOSIS — Z23 Encounter for immunization: Secondary | ICD-10-CM

## 2017-01-08 DIAGNOSIS — F0390 Unspecified dementia without behavioral disturbance: Secondary | ICD-10-CM | POA: Diagnosis not present

## 2017-01-08 DIAGNOSIS — I10 Essential (primary) hypertension: Secondary | ICD-10-CM | POA: Diagnosis not present

## 2017-01-08 DIAGNOSIS — E039 Hypothyroidism, unspecified: Secondary | ICD-10-CM | POA: Diagnosis not present

## 2017-01-08 DIAGNOSIS — F03C0 Unspecified dementia, severe, without behavioral disturbance, psychotic disturbance, mood disturbance, and anxiety: Secondary | ICD-10-CM

## 2017-01-08 DIAGNOSIS — E46 Unspecified protein-calorie malnutrition: Secondary | ICD-10-CM | POA: Diagnosis not present

## 2017-01-08 DIAGNOSIS — G20A1 Parkinson's disease without dyskinesia, without mention of fluctuations: Secondary | ICD-10-CM

## 2017-01-08 LAB — TSH

## 2017-01-08 NOTE — Progress Notes (Signed)
Subjective:  Patient ID: [name], female    DOB: May 22, 1941  Age: 76 y.o. MRN: [id number]  CC: Hypertension and Hypothyroidism   HPI [name] presents for fup - Patient is not conversational. Her daughter does most of the talking. I was contacted about a month ago about a low TSH level and sent the message to the family that they should stop giving her levothyroxine. Her daughter tells me that they never stop giving her the levothyroxine. She is concerned about continued weight loss. She said her mother does eat about 3 meals a day but the quantity of food is not very significant. She drinks even show a couple times a day as well. She has become incontinent of urine in stool. She sleeps most of the night and day and sometimes wanders during the day.  Outpatient Medications Prior to Visit  Medication Sig Dispense Refill  . carbidopa-levodopa-entacapone (STALEVO) 25-100-200 MG tablet Take 1 tablet by mouth 3 (three) times daily. 270 tablet 4  . CVS VITAMIN D 2000 units CAPS TAKE 1 CAPSULE (2,000 UNITS TOTAL) BY MOUTH DAILY. 90 capsule 3  . levothyroxine (SYNTHROID, LEVOTHROID) 88 MCG tablet Take 88 mcg by mouth daily.  1   No facility-administered medications prior to visit.     ROS Review of Systems  Constitutional: Positive for activity change, fatigue and unexpected weight change. Negative for appetite change.  HENT: Negative.  Negative for trouble swallowing.   Eyes: Negative.   Respiratory: Negative.  Negative for cough and shortness of breath.   Cardiovascular: Negative for chest pain, palpitations and leg swelling.  Gastrointestinal: Negative for abdominal pain, constipation, diarrhea, nausea and vomiting.  Endocrine: Positive for heat intolerance. Negative for cold intolerance.  Genitourinary: Negative.  Negative for difficulty urinating.  Musculoskeletal: Negative.   Skin: Negative.   Allergic/Immunologic: Negative for environmental allergies.  Neurological:  Negative.   Hematological: Negative for adenopathy. Does not bruise/bleed easily.  Psychiatric/Behavioral: Positive for behavioral problems, confusion and decreased concentration. Negative for sleep disturbance.    Objective:  BP 100/60 (BP Location: Left Arm, Patient Position: Sitting, Cuff Size: Normal)   Pulse (!) 133   Temp 97.8 F (36.6 C) (Oral)   Resp 16   Ht 4\' 11"  (1.499 m)   Wt 79 lb 8 oz (36.1 kg)   SpO2 98%   BMI 16.06 kg/m   BP Readings from Last 3 Encounters:  01/08/17 100/60  12/19/16 136/69  12/04/16 (!) 129/52    Wt Readings from Last 3 Encounters:  01/08/17 79 lb 8 oz (36.1 kg)  12/19/16 81 lb (36.7 kg)  12/04/16 81 lb 14.4 oz (37.1 kg)    Physical Exam  Constitutional: She is oriented to person, place, and time.  Non-toxic appearance. She has a sickly appearance. She appears ill. No distress.  Thin, frail, drooling  HENT:  Mouth/Throat: Oropharynx is clear and moist. No oropharyngeal exudate.  Eyes: Conjunctivae are normal. Right eye exhibits no discharge. Left eye exhibits no discharge. No scleral icterus.  Neck: Normal range of motion. Neck supple. No JVD present. No thyromegaly present.  Cardiovascular: Normal rate, regular rhythm and intact distal pulses.  Exam reveals no gallop and no friction rub.   No murmur heard. Pulmonary/Chest: Effort normal and breath sounds normal. No respiratory distress. She has no wheezes. She has no rales. She exhibits no tenderness.  Abdominal: Soft. Bowel sounds are normal. She exhibits no distension and no mass. There is no tenderness. There is no rebound and no guarding.  Musculoskeletal: Normal range of motion. She exhibits no edema, tenderness or deformity.  Lymphadenopathy:    She has no cervical adenopathy.  Neurological: She is alert and oriented to person, place, and time.  Skin: Skin is warm and dry. No rash noted. She is not diaphoretic. No erythema. No pallor.  Psychiatric: Thought content is not paranoid.  She is noncommunicative.  Vitals reviewed.   Lab Results  Component Value Date   WBC 3.4 (L) 12/04/2016   HGB 13.0 12/04/2016   HCT 39.2 12/04/2016   PLT 180 12/04/2016   GLUCOSE 91 12/04/2016   CHOL 176 09/13/2014   TRIG 111.0 09/13/2014   HDL 61.70 09/13/2014   LDLDIRECT 142.2 10/28/2008   LDLCALC 92 09/13/2014   ALT <6 12/04/2016   AST 17 12/04/2016   NA 145 12/04/2016   K 3.9 12/04/2016   CL 108 11/08/2016   CREATININE 0.8 12/04/2016   BUN 18.1 12/04/2016   CO2 30 (H) 12/04/2016   TSH <0.01 Repeated and verified X2. (L) 01/08/2017   INR 1.18 09/19/2015   HGBA1C 5.4 09/13/2014   MICROALBUR <0.7 09/13/2014    Ct Head Wo Contrast  Result Date: 11/08/2016 CLINICAL DATA:  Pain after fall. EXAM: CT HEAD WITHOUT CONTRAST CT CERVICAL SPINE WITHOUT CONTRAST TECHNIQUE: Multidetector CT imaging of the head and cervical spine was performed following the standard protocol without intravenous contrast. Multiplanar CT image reconstructions of the cervical spine were also generated. COMPARISON:  December 16, 2015 and July 23, 2016 FINDINGS: CT HEAD FINDINGS Brain: No subdural, epidural, or subarachnoid hemorrhage. 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 These findings are unchanged given difference in slice selection. Ventricles and sulci are prominent but stable. Moderate to severe white matter changes remain. No acute cortical ischemia or infarct is identified. The cerebellum, brainstem, and basal cisterns are normal. No mass, mass effect, or midline shift. Vascular: Calcified atherosclerosis is seen in the intracranial carotid arteries. Skull: Normal. Negative for fracture or focal lesion. Sinuses/Orbits: No acute finding. Other: There is a hematoma over the posterior left scalp. CT CERVICAL SPINE FINDINGS Alignment: Minimal anterolisthesis of C3 versus C4 is stable since June of 2017. No other malalignment.  Skull base and vertebrae: No acute fracture. No primary bone lesion or focal pathologic process. Soft tissues and spinal canal: No prevertebral fluid or swelling. No visible canal hematoma. Disc levels: Degenerative disc disease and facet degenerative changes are stable. Upper chest: Negative. Other: No other abnormalities identified. IMPRESSION: 1. No acute intracranial abnormality. 2. No fracture or traumatic malalignment in the cervical spine. Degenerative changes. Electronically Signed   By: Dorise Bullion III M.D   On: 11/08/2016 17:14   Ct Cervical Spine Wo Contrast  Result Date: 11/08/2016 CLINICAL DATA:  Pain after fall. EXAM: CT HEAD WITHOUT CONTRAST CT CERVICAL SPINE WITHOUT CONTRAST TECHNIQUE: Multidetector CT imaging of the head and cervical spine was performed following the standard protocol without intravenous contrast. Multiplanar CT image reconstructions of the cervical spine were also generated. COMPARISON:  December 16, 2015 and July 23, 2016 FINDINGS: CT HEAD FINDINGS Brain: No subdural, epidural, or subarachnoid hemorrhage. 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 These findings are unchanged given difference in slice selection. Ventricles and sulci are prominent but stable. Moderate to severe white matter changes remain. No acute cortical ischemia or infarct is identified.  The cerebellum, brainstem, and basal cisterns are normal. No mass, mass effect, or midline shift. Vascular: Calcified atherosclerosis is seen in the intracranial carotid arteries. Skull: Normal. Negative for fracture or focal lesion. Sinuses/Orbits: No acute finding. Other: There is a hematoma over the posterior left scalp. CT CERVICAL SPINE FINDINGS Alignment: Minimal anterolisthesis of C3 versus C4 is stable since June of 2017. No other malalignment. Skull base and vertebrae: No acute fracture. No primary bone lesion or focal pathologic  process. Soft tissues and spinal canal: No prevertebral fluid or swelling. No visible canal hematoma. Disc levels: Degenerative disc disease and facet degenerative changes are stable. Upper chest: Negative. Other: No other abnormalities identified. IMPRESSION: 1. No acute intracranial abnormality. 2. No fracture or traumatic malalignment in the cervical spine. Degenerative changes. Electronically Signed   By: Dorise Bullion III M.D   On: 11/08/2016 17:14    Assessment & Plan:   Cecia was seen today for hypertension and hypothyroidism.  Diagnoses and all orders for this visit:  Essential hypertension- her blood pressure is adequately well controlled.  Acquired hypothyroidism- she continues to lose weight and her TSH is undetectable. I reinforced with her daughter that they need to stop giving her the thyroid replacement medication. -     TSH; Future  Severe dementia- I've encouraged the family to let hospice into the home and help him take care of the patient. 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 Franta levothyroxine. I am also having her maintain her CVS VITAMIN D and carbidopa-levodopa-entacapone.  No orders of the defined types were placed in this encounter.    Follow-up: Return in about 3 months (around 04/10/2017).  Scarlette Calico, MD

## 2017-01-08 NOTE — Telephone Encounter (Signed)
Received notice that her stalevo last filled 09-08-16, ? On noncompliance.  I called and spoke to pts husband, who is caregiver and she is taking the stalevo.  Last seen 12-19-16 and decreased dosing to TID.

## 2017-01-08 NOTE — Patient Instructions (Signed)
Hypothyroidism Hypothyroidism is a disorder of the thyroid. The thyroid is a large gland that is located in the lower front of the neck. The thyroid releases hormones that control how the body works. With hypothyroidism, the thyroid does not make enough of these hormones. What are the causes? Causes of hypothyroidism may include:  Viral infections.  Pregnancy.  Your own defense system (immune system) attacking your thyroid.  Certain medicines.  Birth defects.  Past radiation treatments to your head or neck.  Past treatment with radioactive iodine.  Past surgical removal of part or all of your thyroid.  Problems with the gland that is located in the center of your brain (pituitary).  What are the signs or symptoms? Signs and symptoms of hypothyroidism may include:  Feeling as though you have no energy (lethargy).  Inability to tolerate cold.  Weight gain that is not explained by a change in diet or exercise habits.  Dry skin.  Coarse hair.  Menstrual irregularity.  Slowing of thought processes.  Constipation.  Sadness or depression.  How is this diagnosed? Your health care provider may diagnose hypothyroidism with blood tests and ultrasound tests. How is this treated? Hypothyroidism is treated with medicine that replaces the hormones that your body does not make. After you begin treatment, it may take several weeks for symptoms to go away. Follow these instructions at home:  Take medicines only as directed by your health care provider.  If you start taking any new medicines, tell your health care provider.  Keep all follow-up visits as directed by your health care provider. This is important. As your condition improves, your dosage needs may change. You will need to have blood tests regularly so that your health care provider can watch your condition. Contact a health care provider if:  Your symptoms do not get better with treatment.  You are taking thyroid  replacement medicine and: ? You sweat excessively. ? You have tremors. ? You feel anxious. ? You lose weight rapidly. ? You cannot tolerate heat. ? You have emotional swings. ? You have diarrhea. ? You feel weak. Get help right away if:  You develop chest pain.  You develop an irregular heartbeat.  You develop a rapid heartbeat. This information is not intended to replace advice given to you by your health care provider. Make sure you discuss any questions you have with your health care provider. Document Released: 06/11/2005 Document Revised: 11/17/2015 Document Reviewed: 10/27/2013 Elsevier Interactive Patient Education  2017 [employer]  

## 2017-01-16 ENCOUNTER — Telehealth: Payer: Self-pay | Admitting: Internal Medicine

## 2017-01-16 NOTE — Telephone Encounter (Signed)
Patient completed DPR on 01/08/17 but was not signed by her.  I have left message with spouse that next time patient is over she can drop into the office to complete another.

## 2017-01-22 DIAGNOSIS — E559 Vitamin D deficiency, unspecified: Secondary | ICD-10-CM | POA: Diagnosis not present

## 2017-01-22 DIAGNOSIS — E46 Unspecified protein-calorie malnutrition: Secondary | ICD-10-CM | POA: Diagnosis not present

## 2017-01-22 DIAGNOSIS — E039 Hypothyroidism, unspecified: Secondary | ICD-10-CM | POA: Diagnosis not present

## 2017-01-22 DIAGNOSIS — G20 Parkinson's disease: Secondary | ICD-10-CM | POA: Diagnosis not present

## 2017-01-22 DIAGNOSIS — R1310 Dysphagia, unspecified: Secondary | ICD-10-CM | POA: Diagnosis not present

## 2017-01-23 DIAGNOSIS — E039 Hypothyroidism, unspecified: Secondary | ICD-10-CM | POA: Diagnosis not present

## 2017-01-23 DIAGNOSIS — E46 Unspecified protein-calorie malnutrition: Secondary | ICD-10-CM | POA: Diagnosis not present

## 2017-01-23 DIAGNOSIS — G20 Parkinson's disease: Secondary | ICD-10-CM | POA: Diagnosis not present

## 2017-01-23 DIAGNOSIS — R1310 Dysphagia, unspecified: Secondary | ICD-10-CM | POA: Diagnosis not present

## 2017-01-23 DIAGNOSIS — E559 Vitamin D deficiency, unspecified: Secondary | ICD-10-CM | POA: Diagnosis not present

## 2017-02-01 DIAGNOSIS — G20 Parkinson's disease: Secondary | ICD-10-CM | POA: Diagnosis not present

## 2017-02-01 DIAGNOSIS — R1310 Dysphagia, unspecified: Secondary | ICD-10-CM | POA: Diagnosis not present

## 2017-02-01 DIAGNOSIS — E559 Vitamin D deficiency, unspecified: Secondary | ICD-10-CM | POA: Diagnosis not present

## 2017-02-01 DIAGNOSIS — E039 Hypothyroidism, unspecified: Secondary | ICD-10-CM | POA: Diagnosis not present

## 2017-02-01 DIAGNOSIS — E46 Unspecified protein-calorie malnutrition: Secondary | ICD-10-CM | POA: Diagnosis not present

## 2017-02-05 DIAGNOSIS — E559 Vitamin D deficiency, unspecified: Secondary | ICD-10-CM | POA: Diagnosis not present

## 2017-02-05 DIAGNOSIS — E039 Hypothyroidism, unspecified: Secondary | ICD-10-CM | POA: Diagnosis not present

## 2017-02-05 DIAGNOSIS — R1310 Dysphagia, unspecified: Secondary | ICD-10-CM | POA: Diagnosis not present

## 2017-02-05 DIAGNOSIS — E46 Unspecified protein-calorie malnutrition: Secondary | ICD-10-CM | POA: Diagnosis not present

## 2017-02-05 DIAGNOSIS — G20 Parkinson's disease: Secondary | ICD-10-CM | POA: Diagnosis not present

## 2017-02-06 ENCOUNTER — Telehealth: Payer: Self-pay | Admitting: Internal Medicine

## 2017-02-06 ENCOUNTER — Other Ambulatory Visit: Payer: Self-pay | Admitting: Internal Medicine

## 2017-02-06 DIAGNOSIS — E039 Hypothyroidism, unspecified: Secondary | ICD-10-CM

## 2017-02-06 NOTE — Telephone Encounter (Signed)
Okay for orders? 

## 2017-02-06 NOTE — Telephone Encounter (Signed)
Lab order entered.

## 2017-02-06 NOTE — Telephone Encounter (Signed)
Pt's daughter was in the office today and asked if orders for thyroid labs could be put into the system for the pt.

## 2017-02-07 NOTE — Telephone Encounter (Signed)
Left a message for [name]. [facility] can do the orders. [name] will call back for the process that we will need to go through to get them to do it.

## 2017-02-13 DIAGNOSIS — E559 Vitamin D deficiency, unspecified: Secondary | ICD-10-CM | POA: Diagnosis not present

## 2017-02-13 DIAGNOSIS — E46 Unspecified protein-calorie malnutrition: Secondary | ICD-10-CM | POA: Diagnosis not present

## 2017-02-13 DIAGNOSIS — G20 Parkinson's disease: Secondary | ICD-10-CM | POA: Diagnosis not present

## 2017-02-13 DIAGNOSIS — E039 Hypothyroidism, unspecified: Secondary | ICD-10-CM | POA: Diagnosis not present

## 2017-02-13 DIAGNOSIS — R1310 Dysphagia, unspecified: Secondary | ICD-10-CM | POA: Diagnosis not present

## 2017-02-13 LAB — TSH: TSH: 27.43 — AB (ref 0.41–5.90)

## 2017-02-14 ENCOUNTER — Other Ambulatory Visit: Payer: Self-pay | Admitting: Internal Medicine

## 2017-02-14 ENCOUNTER — Encounter: Payer: Self-pay | Admitting: Internal Medicine

## 2017-02-14 DIAGNOSIS — E039 Hypothyroidism, unspecified: Secondary | ICD-10-CM

## 2017-02-14 MED ORDER — LEVOTHYROXINE SODIUM 50 MCG PO TABS: 50.0000 ug | ORAL_TABLET | Freq: Every day | ORAL | 0 refills | Status: DC

## 2017-02-19 DIAGNOSIS — R1310 Dysphagia, unspecified: Secondary | ICD-10-CM | POA: Diagnosis not present

## 2017-02-19 DIAGNOSIS — E46 Unspecified protein-calorie malnutrition: Secondary | ICD-10-CM | POA: Diagnosis not present

## 2017-02-19 DIAGNOSIS — G20 Parkinson's disease: Secondary | ICD-10-CM | POA: Diagnosis not present

## 2017-02-19 DIAGNOSIS — E039 Hypothyroidism, unspecified: Secondary | ICD-10-CM | POA: Diagnosis not present

## 2017-02-19 DIAGNOSIS — E559 Vitamin D deficiency, unspecified: Secondary | ICD-10-CM | POA: Diagnosis not present

## 2017-02-23 DIAGNOSIS — R1310 Dysphagia, unspecified: Secondary | ICD-10-CM | POA: Diagnosis not present

## 2017-02-23 DIAGNOSIS — G20 Parkinson's disease: Secondary | ICD-10-CM | POA: Diagnosis not present

## 2017-02-23 DIAGNOSIS — E039 Hypothyroidism, unspecified: Secondary | ICD-10-CM | POA: Diagnosis not present

## 2017-02-23 DIAGNOSIS — E559 Vitamin D deficiency, unspecified: Secondary | ICD-10-CM | POA: Diagnosis not present

## 2017-02-23 DIAGNOSIS — E46 Unspecified protein-calorie malnutrition: Secondary | ICD-10-CM | POA: Diagnosis not present

## 2017-03-01 DIAGNOSIS — E559 Vitamin D deficiency, unspecified: Secondary | ICD-10-CM | POA: Diagnosis not present

## 2017-03-01 DIAGNOSIS — E039 Hypothyroidism, unspecified: Secondary | ICD-10-CM | POA: Diagnosis not present

## 2017-03-01 DIAGNOSIS — R1310 Dysphagia, unspecified: Secondary | ICD-10-CM | POA: Diagnosis not present

## 2017-03-01 DIAGNOSIS — G20 Parkinson's disease: Secondary | ICD-10-CM | POA: Diagnosis not present

## 2017-03-01 DIAGNOSIS — E46 Unspecified protein-calorie malnutrition: Secondary | ICD-10-CM | POA: Diagnosis not present

## 2017-03-07 DIAGNOSIS — E559 Vitamin D deficiency, unspecified: Secondary | ICD-10-CM | POA: Diagnosis not present

## 2017-03-07 DIAGNOSIS — E039 Hypothyroidism, unspecified: Secondary | ICD-10-CM | POA: Diagnosis not present

## 2017-03-07 DIAGNOSIS — G20 Parkinson's disease: Secondary | ICD-10-CM | POA: Diagnosis not present

## 2017-03-07 DIAGNOSIS — R1310 Dysphagia, unspecified: Secondary | ICD-10-CM | POA: Diagnosis not present

## 2017-03-07 DIAGNOSIS — E46 Unspecified protein-calorie malnutrition: Secondary | ICD-10-CM | POA: Diagnosis not present

## 2017-03-08 DIAGNOSIS — E46 Unspecified protein-calorie malnutrition: Secondary | ICD-10-CM | POA: Diagnosis not present

## 2017-03-08 DIAGNOSIS — E559 Vitamin D deficiency, unspecified: Secondary | ICD-10-CM | POA: Diagnosis not present

## 2017-03-08 DIAGNOSIS — R1310 Dysphagia, unspecified: Secondary | ICD-10-CM | POA: Diagnosis not present

## 2017-03-08 DIAGNOSIS — E039 Hypothyroidism, unspecified: Secondary | ICD-10-CM | POA: Diagnosis not present

## 2017-03-08 DIAGNOSIS — G20 Parkinson's disease: Secondary | ICD-10-CM | POA: Diagnosis not present

## 2017-03-15 DIAGNOSIS — E559 Vitamin D deficiency, unspecified: Secondary | ICD-10-CM | POA: Diagnosis not present

## 2017-03-15 DIAGNOSIS — G20 Parkinson's disease: Secondary | ICD-10-CM | POA: Diagnosis not present

## 2017-03-15 DIAGNOSIS — R1310 Dysphagia, unspecified: Secondary | ICD-10-CM | POA: Diagnosis not present

## 2017-03-15 DIAGNOSIS — E039 Hypothyroidism, unspecified: Secondary | ICD-10-CM | POA: Diagnosis not present

## 2017-03-15 DIAGNOSIS — E46 Unspecified protein-calorie malnutrition: Secondary | ICD-10-CM | POA: Diagnosis not present

## 2017-03-20 DIAGNOSIS — M810 Age-related osteoporosis without current pathological fracture: Secondary | ICD-10-CM | POA: Diagnosis not present

## 2017-03-20 DIAGNOSIS — Z1231 Encounter for screening mammogram for malignant neoplasm of breast: Secondary | ICD-10-CM | POA: Diagnosis not present

## 2017-03-20 DIAGNOSIS — Z853 Personal history of malignant neoplasm of breast: Secondary | ICD-10-CM | POA: Diagnosis not present

## 2017-03-20 DIAGNOSIS — M8589 Other specified disorders of bone density and structure, multiple sites: Secondary | ICD-10-CM | POA: Diagnosis not present

## 2017-03-20 LAB — HM DEXA SCAN

## 2017-03-21 ENCOUNTER — Telehealth: Payer: Self-pay | Admitting: *Deleted

## 2017-03-21 NOTE — Telephone Encounter (Signed)
Form filled out and placed on Dr. Gladstone Lighter desk for review and signature.

## 2017-03-22 DIAGNOSIS — E559 Vitamin D deficiency, unspecified: Secondary | ICD-10-CM | POA: Diagnosis not present

## 2017-03-22 DIAGNOSIS — E039 Hypothyroidism, unspecified: Secondary | ICD-10-CM | POA: Diagnosis not present

## 2017-03-22 DIAGNOSIS — E46 Unspecified protein-calorie malnutrition: Secondary | ICD-10-CM | POA: Diagnosis not present

## 2017-03-22 DIAGNOSIS — R1310 Dysphagia, unspecified: Secondary | ICD-10-CM | POA: Diagnosis not present

## 2017-03-22 DIAGNOSIS — G20 Parkinson's disease: Secondary | ICD-10-CM | POA: Diagnosis not present

## 2017-03-25 DIAGNOSIS — E039 Hypothyroidism, unspecified: Secondary | ICD-10-CM | POA: Diagnosis not present

## 2017-03-25 DIAGNOSIS — E559 Vitamin D deficiency, unspecified: Secondary | ICD-10-CM | POA: Diagnosis not present

## 2017-03-25 DIAGNOSIS — G20 Parkinson's disease: Secondary | ICD-10-CM | POA: Diagnosis not present

## 2017-03-25 DIAGNOSIS — E46 Unspecified protein-calorie malnutrition: Secondary | ICD-10-CM | POA: Diagnosis not present

## 2017-03-25 DIAGNOSIS — R1310 Dysphagia, unspecified: Secondary | ICD-10-CM | POA: Diagnosis not present

## 2017-03-28 NOTE — Telephone Encounter (Signed)
[employer] papers completed, signed, sent to medical records for processing.

## 2017-03-29 ENCOUNTER — Encounter: Payer: Self-pay | Admitting: Internal Medicine

## 2017-03-29 DIAGNOSIS — E46 Unspecified protein-calorie malnutrition: Secondary | ICD-10-CM | POA: Diagnosis not present

## 2017-03-29 DIAGNOSIS — E559 Vitamin D deficiency, unspecified: Secondary | ICD-10-CM | POA: Diagnosis not present

## 2017-03-29 DIAGNOSIS — G20 Parkinson's disease: Secondary | ICD-10-CM | POA: Diagnosis not present

## 2017-03-29 DIAGNOSIS — R1310 Dysphagia, unspecified: Secondary | ICD-10-CM | POA: Diagnosis not present

## 2017-03-29 DIAGNOSIS — E039 Hypothyroidism, unspecified: Secondary | ICD-10-CM | POA: Diagnosis not present

## 2017-03-29 NOTE — Telephone Encounter (Signed)
LVM for daughter Johnanna Bakke that patients [employer] paperwork has been completed and to call our office to pay the $50 fee for completing forms.

## 2017-04-03 ENCOUNTER — Telehealth: Payer: Self-pay

## 2017-04-03 DIAGNOSIS — R1310 Dysphagia, unspecified: Secondary | ICD-10-CM | POA: Diagnosis not present

## 2017-04-03 DIAGNOSIS — E039 Hypothyroidism, unspecified: Secondary | ICD-10-CM | POA: Diagnosis not present

## 2017-04-03 DIAGNOSIS — E46 Unspecified protein-calorie malnutrition: Secondary | ICD-10-CM | POA: Diagnosis not present

## 2017-04-03 DIAGNOSIS — G20 Parkinson's disease: Secondary | ICD-10-CM | POA: Diagnosis not present

## 2017-04-03 DIAGNOSIS — E559 Vitamin D deficiency, unspecified: Secondary | ICD-10-CM | POA: Diagnosis not present

## 2017-04-03 NOTE — Telephone Encounter (Signed)
Dr. Jenene Slicker called and stated that pt is eligible for discharge. Pt has gained some weight, dressing and feeding herself. Family has hired an aid that has been helping patient. Dr. Jenene Slicker was there today to reevaluate for Hospice services and he had to wait for her to return from a hair appt.

## 2017-04-04 DIAGNOSIS — E039 Hypothyroidism, unspecified: Secondary | ICD-10-CM | POA: Diagnosis not present

## 2017-04-04 DIAGNOSIS — Z0289 Encounter for other administrative examinations: Secondary | ICD-10-CM

## 2017-04-04 DIAGNOSIS — R1310 Dysphagia, unspecified: Secondary | ICD-10-CM | POA: Diagnosis not present

## 2017-04-04 DIAGNOSIS — E46 Unspecified protein-calorie malnutrition: Secondary | ICD-10-CM | POA: Diagnosis not present

## 2017-04-04 DIAGNOSIS — E559 Vitamin D deficiency, unspecified: Secondary | ICD-10-CM | POA: Diagnosis not present

## 2017-04-04 DIAGNOSIS — G20 Parkinson's disease: Secondary | ICD-10-CM | POA: Diagnosis not present

## 2017-04-08 ENCOUNTER — Encounter: Payer: Self-pay | Admitting: Internal Medicine

## 2017-04-08 ENCOUNTER — Encounter: Payer: Self-pay | Admitting: Hematology and Oncology

## 2017-04-08 ENCOUNTER — Ambulatory Visit (INDEPENDENT_AMBULATORY_CARE_PROVIDER_SITE_OTHER): Payer: Medicare Other | Admitting: Internal Medicine

## 2017-04-08 ENCOUNTER — Other Ambulatory Visit (INDEPENDENT_AMBULATORY_CARE_PROVIDER_SITE_OTHER): Payer: Medicare Other

## 2017-04-08 VITALS — BP 128/70 | HR 50 | Temp 97.4°F | Resp 16 | Ht 59.0 in | Wt 86.2 lb

## 2017-04-08 DIAGNOSIS — R296 Repeated falls: Secondary | ICD-10-CM

## 2017-04-08 DIAGNOSIS — E039 Hypothyroidism, unspecified: Secondary | ICD-10-CM | POA: Diagnosis not present

## 2017-04-08 DIAGNOSIS — E7800 Pure hypercholesterolemia, unspecified: Secondary | ICD-10-CM

## 2017-04-08 DIAGNOSIS — H16223 Keratoconjunctivitis sicca, not specified as Sjogren's, bilateral: Secondary | ICD-10-CM | POA: Diagnosis not present

## 2017-04-08 DIAGNOSIS — G20 Parkinson's disease: Secondary | ICD-10-CM | POA: Diagnosis not present

## 2017-04-08 DIAGNOSIS — E038 Other specified hypothyroidism: Secondary | ICD-10-CM

## 2017-04-08 DIAGNOSIS — H40053 Ocular hypertension, bilateral: Secondary | ICD-10-CM | POA: Diagnosis not present

## 2017-04-08 DIAGNOSIS — H04123 Dry eye syndrome of bilateral lacrimal glands: Secondary | ICD-10-CM | POA: Diagnosis not present

## 2017-04-08 DIAGNOSIS — H401131 Primary open-angle glaucoma, bilateral, mild stage: Secondary | ICD-10-CM | POA: Diagnosis not present

## 2017-04-08 LAB — CBC WITH DIFFERENTIAL/PLATELET
BASOS PCT: 1 % (ref 0.0–3.0)
Basophils Absolute: 0 10*3/uL (ref 0.0–0.1)
EOS PCT: 0.8 % (ref 0.0–5.0)
Eosinophils Absolute: 0 10*3/uL (ref 0.0–0.7)
HCT: 41.1 % (ref 36.0–46.0)
HEMOGLOBIN: 13.4 g/dL (ref 12.0–15.0)
LYMPHS ABS: 1.4 10*3/uL (ref 0.7–4.0)
Lymphocytes Relative: 29.9 % (ref 12.0–46.0)
MCHC: 32.5 g/dL (ref 30.0–36.0)
MCV: 96 fl (ref 78.0–100.0)
MONO ABS: 0.3 10*3/uL (ref 0.1–1.0)
MONOS PCT: 5.8 % (ref 3.0–12.0)
NEUTROS PCT: 62.5 % (ref 43.0–77.0)
Neutro Abs: 2.9 10*3/uL (ref 1.4–7.7)
Platelets: 195 10*3/uL (ref 150.0–400.0)
RBC: 4.29 Mil/uL (ref 3.87–5.11)
RDW: 13.9 % (ref 11.5–15.5)
WBC: 4.6 10*3/uL (ref 4.0–10.5)

## 2017-04-08 LAB — TSH: TSH: 7.15 u[IU]/mL — AB (ref 0.35–4.50)

## 2017-04-08 LAB — LIPID PANEL
CHOLESTEROL: 187 mg/dL (ref 0–200)
HDL: 72.4 mg/dL (ref >39.00)
LDL Cholesterol: 100 mg/dL — ABNORMAL HIGH (ref 0–99)
NonHDL: 115.09
Total CHOL/HDL Ratio: 3
Triglycerides: 73 mg/dL (ref 0.0–149.0)
VLDL: 14.6 mg/dL (ref 0.0–40.0)

## 2017-04-08 LAB — HM DIABETES EYE EXAM

## 2017-04-08 MED ORDER — LEVOTHYROXINE SODIUM 50 MCG PO TABS: 50.0000 ug | ORAL_TABLET | Freq: Every day | ORAL | 1 refills | Status: AC

## 2017-04-08 NOTE — Patient Instructions (Signed)
Hypothyroidism Hypothyroidism is a disorder of the thyroid. The thyroid is a large gland that is located in the lower front of the neck. The thyroid releases hormones that control how the body works. With hypothyroidism, the thyroid does not make enough of these hormones. What are the causes? Causes of hypothyroidism may include:  Viral infections.  Pregnancy.  Your own defense system (immune system) attacking your thyroid.  Certain medicines.  Birth defects.  Past radiation treatments to your head or neck.  Past treatment with radioactive iodine.  Past surgical removal of part or all of your thyroid.  Problems with the gland that is located in the center of your brain (pituitary).  What are the signs or symptoms? Signs and symptoms of hypothyroidism may include:  Feeling as though you have no energy (lethargy).  Inability to tolerate cold.  Weight gain that is not explained by a change in diet or exercise habits.  Dry skin.  Coarse hair.  Menstrual irregularity.  Slowing of thought processes.  Constipation.  Sadness or depression.  How is this diagnosed? Your health care provider may diagnose hypothyroidism with blood tests and ultrasound tests. How is this treated? Hypothyroidism is treated with medicine that replaces the hormones that your body does not make. After you begin treatment, it may take several weeks for symptoms to go away. Follow these instructions at home:  Take medicines only as directed by your health care provider.  If you start taking any new medicines, tell your health care provider.  Keep all follow-up visits as directed by your health care provider. This is important. As your condition improves, your dosage needs may change. You will need to have blood tests regularly so that your health care provider can watch your condition. Contact a health care provider if:  Your symptoms do not get better with treatment.  You are taking thyroid  replacement medicine and: ? You sweat excessively. ? You have tremors. ? You feel anxious. ? You lose weight rapidly. ? You cannot tolerate heat. ? You have emotional swings. ? You have diarrhea. ? You feel weak. Get help right away if:  You develop chest pain.  You develop an irregular heartbeat.  You develop a rapid heartbeat. This information is not intended to replace advice given to you by your health care provider. Make sure you discuss any questions you have with your health care provider. Document Released: 06/11/2005 Document Revised: 11/17/2015 Document Reviewed: 10/27/2013 Elsevier Interactive Patient Education  2017 [employer]  

## 2017-04-08 NOTE — Progress Notes (Signed)
Subjective:  Patient ID: [name], female    DOB: [birth year]  Age: 76 y.o. MRN: [id number]  CC: Hypothyroidism and Hyperlipidemia   HPI [name] presents for f/up - Her son and husband are with her today and they tell me that they think she's doing well. She has gained a few pounds over the last month. She is taking her thyroid medication as directed. She is noncommunicative and offers no history herself today.  Outpatient Medications Prior to Visit  Medication Sig Dispense Refill  . carbidopa-levodopa-entacapone (STALEVO) 25-100-200 MG tablet Take 1 tablet by mouth 3 (three) times daily. 270 tablet 4  . CVS VITAMIN D 2000 units CAPS TAKE 1 CAPSULE (2,000 UNITS TOTAL) BY MOUTH DAILY. 90 capsule 3  . levothyroxine (SYNTHROID, LEVOTHROID) 50 MCG tablet Take 1 tablet (50 mcg total) by mouth daily. 90 tablet 0   No facility-administered medications prior to visit.     ROS Review of Systems  All other systems reviewed and are negative.   Objective:  BP 128/70 (BP Location: Left Arm, Patient Position: Sitting, Cuff Size: Normal)   Pulse (!) 50   Temp (!) 97.4 F (36.3 C) (Oral)   Resp 16   Ht 4\' 11"  (1.499 m)   Wt 86 lb 4 oz (39.1 kg)   SpO2 (!) 86%   BMI 17.42 kg/m   BP Readings from Last 3 Encounters:  04/08/17 128/70  01/08/17 100/60  12/19/16 136/69    Wt Readings from Last 3 Encounters:  04/08/17 86 lb 4 oz (39.1 kg)  01/08/17 79 lb 8 oz (36.1 kg)  12/19/16 81 lb (36.7 kg)    Physical Exam  Constitutional: No distress.  HENT:  Mouth/Throat: Oropharynx is clear and moist. No oropharyngeal exudate.  Eyes: Conjunctivae are normal. Right eye exhibits no discharge. Left eye exhibits no discharge. No scleral icterus.  Neck: Normal range of motion. Neck supple. No JVD present. No thyromegaly present.  Cardiovascular: Normal rate, regular rhythm and intact distal pulses.  Exam reveals no gallop and no friction rub.   No murmur heard. Pulmonary/Chest: Effort  normal and breath sounds normal. No respiratory distress. She has no wheezes. She has no rales. She exhibits no tenderness.  Abdominal: Soft. Bowel sounds are normal. She exhibits no distension and no mass. There is no tenderness. There is no rebound and no guarding.  Musculoskeletal: Normal range of motion. She exhibits no edema, tenderness or deformity.  Lymphadenopathy:    She has no cervical adenopathy.  Neurological: She is alert.  Skin: Skin is warm and dry. No rash noted. She is not diaphoretic. No erythema. No pallor.  Vitals reviewed.   Lab Results  Component Value Date   WBC 4.6 04/08/2017   HGB 13.4 04/08/2017   HCT 41.1 04/08/2017   PLT 195.0 04/08/2017   GLUCOSE 91 12/04/2016   CHOL 187 04/08/2017   TRIG 73.0 04/08/2017   HDL 72.40 04/08/2017   LDLDIRECT 142.2 10/28/2008   LDLCALC 100 (H) 04/08/2017   ALT <6 12/04/2016   AST 17 12/04/2016   NA 145 12/04/2016   K 3.9 12/04/2016   CL 108 11/08/2016   CREATININE 0.8 12/04/2016   BUN 18.1 12/04/2016   CO2 30 (H) 12/04/2016   TSH 7.15 (H) 04/08/2017   INR 1.18 09/19/2015   HGBA1C 5.4 09/13/2014   MICROALBUR <0.7 09/13/2014    Ct Head Wo Contrast  Result Date: 11/08/2016 CLINICAL DATA:  Pain after fall. EXAM: CT HEAD WITHOUT CONTRAST CT CERVICAL SPINE  WITHOUT CONTRAST TECHNIQUE: Multidetector CT imaging of the head and cervical spine was performed following the standard protocol without intravenous contrast. Multiplanar CT image reconstructions of the cervical spine were also generated. COMPARISON:  December 16, 2015 and July 23, 2016 FINDINGS: CT HEAD FINDINGS Brain: No subdural, epidural, or subarachnoid hemorrhage. 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 These findings are unchanged given difference in slice selection. Ventricles and sulci are prominent but stable. Moderate to severe white matter changes remain. No acute cortical ischemia  or infarct is identified. The cerebellum, brainstem, and basal cisterns are normal. No mass, mass effect, or midline shift. Vascular: Calcified atherosclerosis is seen in the intracranial carotid arteries. Skull: Normal. Negative for fracture or focal lesion. Sinuses/Orbits: No acute finding. Other: There is a hematoma over the posterior left scalp. CT CERVICAL SPINE FINDINGS Alignment: Minimal anterolisthesis of C3 versus C4 is stable since June of 2017. No other malalignment. Skull base and vertebrae: No acute fracture. No primary bone lesion or focal pathologic process. Soft tissues and spinal canal: No prevertebral fluid or swelling. No visible canal hematoma. Disc levels: Degenerative disc disease and facet degenerative changes are stable. Upper chest: Negative. Other: No other abnormalities identified. IMPRESSION: 1. No acute intracranial abnormality. 2. No fracture or traumatic malalignment in the cervical spine. Degenerative changes. Electronically Signed   By: Dorise Bullion III M.D   On: 11/08/2016 17:14   Ct Cervical Spine Wo Contrast  Result Date: 11/08/2016 CLINICAL DATA:  Pain after fall. EXAM: CT HEAD WITHOUT CONTRAST CT CERVICAL SPINE WITHOUT CONTRAST TECHNIQUE: Multidetector CT imaging of the head and cervical spine was performed following the standard protocol without intravenous contrast. Multiplanar CT image reconstructions of the cervical spine were also generated. COMPARISON:  December 16, 2015 and July 23, 2016 FINDINGS: CT HEAD FINDINGS Brain: No subdural, epidural, or subarachnoid hemorrhage. 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 These findings are unchanged given difference in slice selection. Ventricles and sulci are prominent but stable. Moderate to severe white matter changes remain. No acute cortical ischemia or infarct is identified. The cerebellum, brainstem, and basal cisterns are normal. No  mass, mass effect, or midline shift. Vascular: Calcified atherosclerosis is seen in the intracranial carotid arteries. Skull: Normal. Negative for fracture or focal lesion. Sinuses/Orbits: No acute finding. Other: There is a hematoma over the posterior left scalp. CT CERVICAL SPINE FINDINGS Alignment: Minimal anterolisthesis of C3 versus C4 is stable since June of 2017. No other malalignment. Skull base and vertebrae: No acute fracture. No primary bone lesion or focal pathologic process. Soft tissues and spinal canal: No prevertebral fluid or swelling. No visible canal hematoma. Disc levels: Degenerative disc disease and facet degenerative changes are stable. Upper chest: Negative. Other: No other abnormalities identified. IMPRESSION: 1. No acute intracranial abnormality. 2. No fracture or traumatic malalignment in the cervical spine. Degenerative changes. Electronically Signed   By: Dorise Bullion III M.D   On: 11/08/2016 17:14    Assessment & Plan:   Mayo was seen today for hypothyroidism and hyperlipidemia.  Diagnoses and all orders for this visit:  Other specified hypothyroidism- as below -     Cancel: TSH; Future -     CBC with Differential/Platelet; Future -     TSH; Future  Parkinson's disease ([place]) -     For home use only  DME 4 wheeled rolling walker with seat ([id number])  Falls frequently -     For home use only DME 4 wheeled rolling walker with seat ([id number])  HYPERCHOLESTEROLEMIA- she has an elevated ASCVD risk score but the family does not want to give her a statin. -     Lipid panel; Future  Acquired hypothyroidism- her TSH is in an acceptable range considering her age. Will continue the current dose of levothyroxine. -     levothyroxine (SYNTHROID, LEVOTHROID) 50 MCG tablet; Take 1 tablet (50 mcg total) by mouth daily before breakfast.   I have changed Ms. Mattila levothyroxine. I am also having her maintain her CVS VITAMIN D and carbidopa-levodopa-entacapone.  Meds  ordered this encounter  Medications  . levothyroxine (SYNTHROID, LEVOTHROID) 50 MCG tablet    Sig: Take 1 tablet (50 mcg total) by mouth daily before breakfast.    Dispense:  90 tablet    Refill:  1     Follow-up: Return in about 3 months (around 07/09/2017).  Scarlette Calico, MD

## 2017-04-10 ENCOUNTER — Ambulatory Visit: Payer: Medicare Other | Admitting: Internal Medicine

## 2017-04-10 DIAGNOSIS — R531 Weakness: Secondary | ICD-10-CM | POA: Diagnosis not present

## 2017-04-25 DIAGNOSIS — H401111 Primary open-angle glaucoma, right eye, mild stage: Secondary | ICD-10-CM | POA: Diagnosis not present

## 2017-04-25 DIAGNOSIS — H40051 Ocular hypertension, right eye: Secondary | ICD-10-CM | POA: Diagnosis not present

## 2017-04-27 ENCOUNTER — Other Ambulatory Visit: Payer: Self-pay | Admitting: Internal Medicine

## 2017-04-27 DIAGNOSIS — E039 Hypothyroidism, unspecified: Secondary | ICD-10-CM

## 2017-05-09 DIAGNOSIS — H40052 Ocular hypertension, left eye: Secondary | ICD-10-CM | POA: Diagnosis not present

## 2017-05-09 DIAGNOSIS — H401121 Primary open-angle glaucoma, left eye, mild stage: Secondary | ICD-10-CM | POA: Diagnosis not present

## 2017-05-29 NOTE — Telephone Encounter (Signed)
error 

## 2017-05-30 IMAGING — CT CT HEAD W/O CM
1 series · 15 of 29 positions shown, 19 images · non-contrast
Comparison: CT brain of 08/05/2015 and MR brain of 09/09/2012

CLINICAL DATA: Frequent falls over the last few months hitting the
head, history of Parkinson's disease

EXAM:
CT HEAD WITHOUT CONTRAST
TECHNIQUE: Contiguous axial images were obtained from the base of the skull
through the vertex without intravenous contrast.

[Series 2: head 5.0 h37s · axial · 0.41mm/px · z∈[+106,+236]mm · 15 of 29 slices shown, 19 images]
[im 2/29  brain]
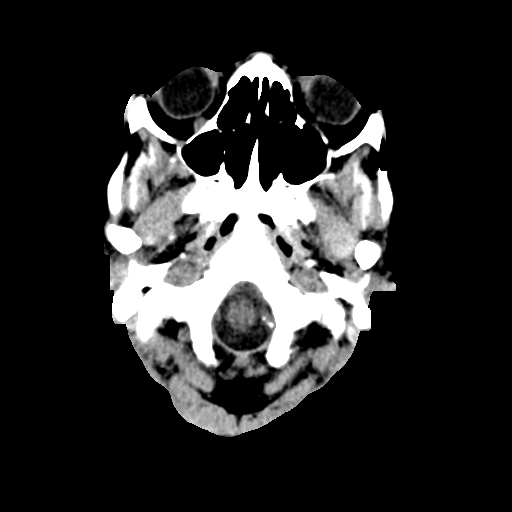
[im 2/29  bone]
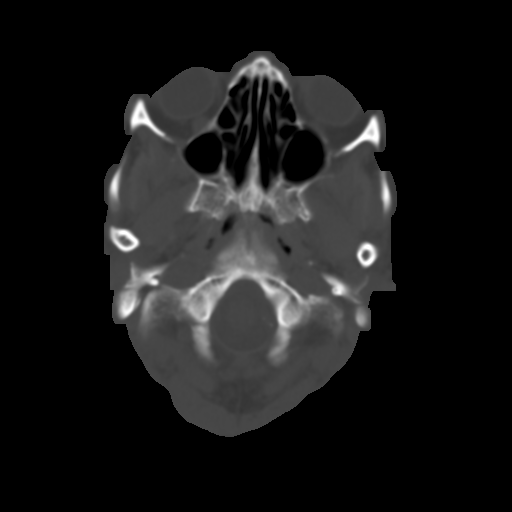
[im 4/29  brain]
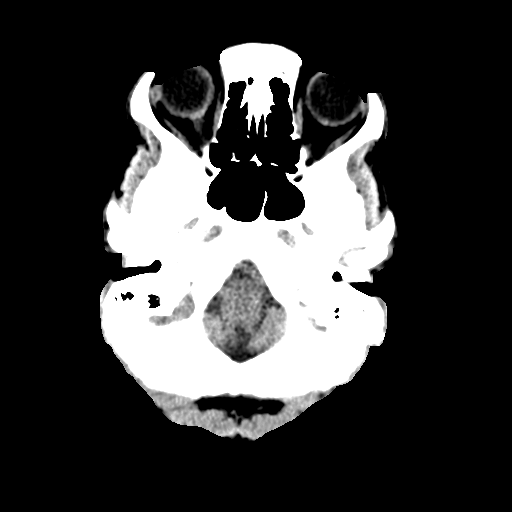
[im 6/29  brain]
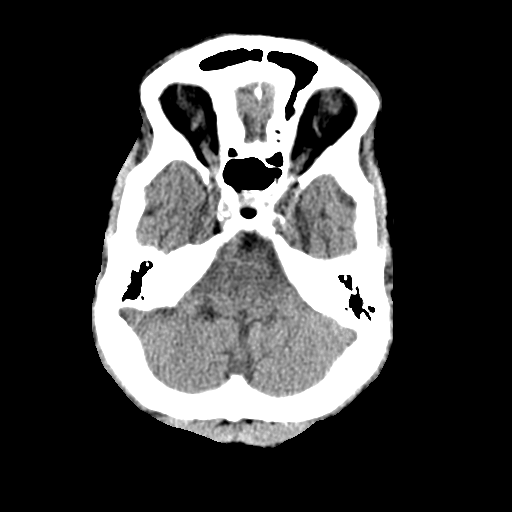
[im 8/29  brain]
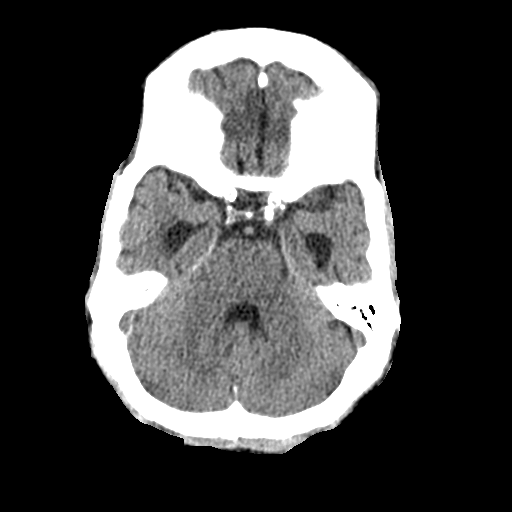
[im 10/29  brain]
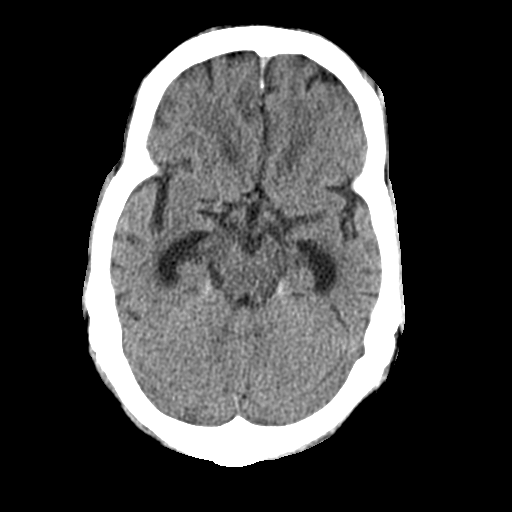
[im 10/29  bone]
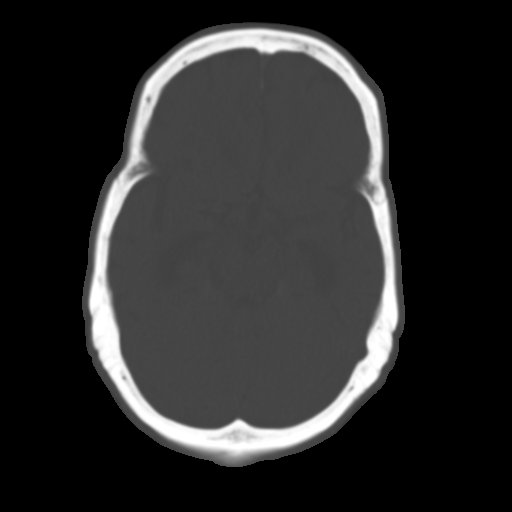
[im 11/29  brain]
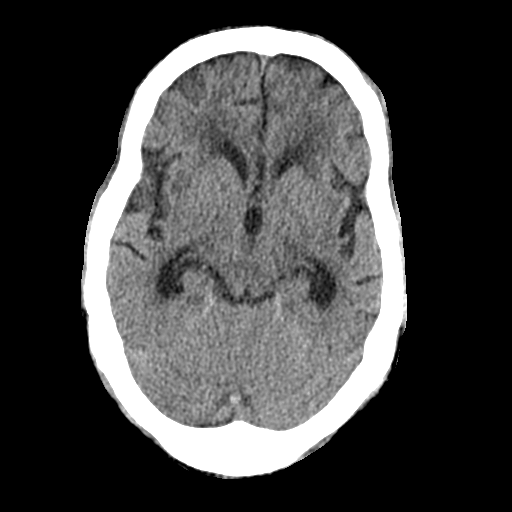
[im 13/29  brain]
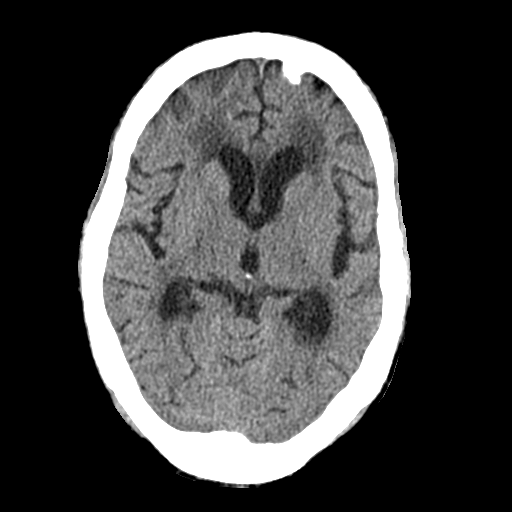
[im 15/29  brain]
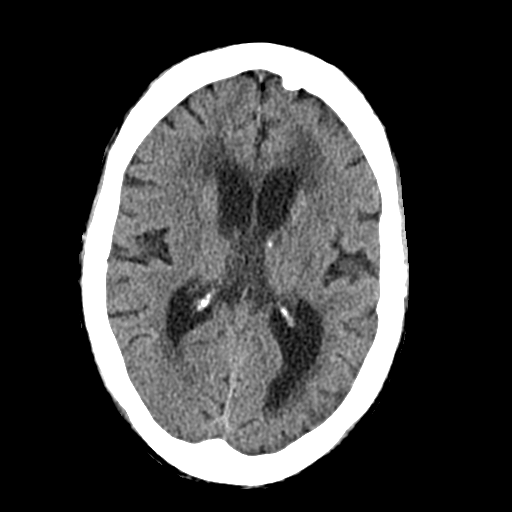
[im 17/29  brain]
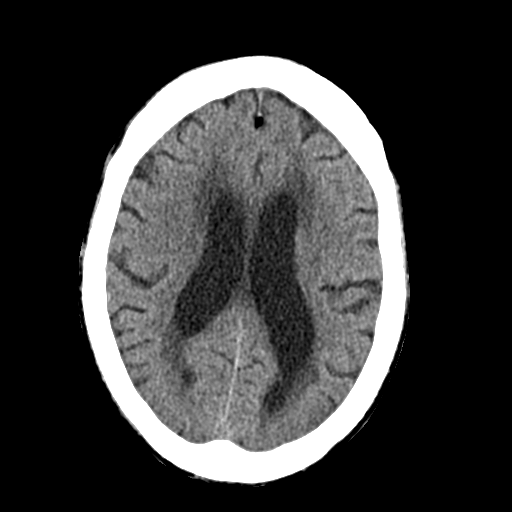
[im 17/29  bone]
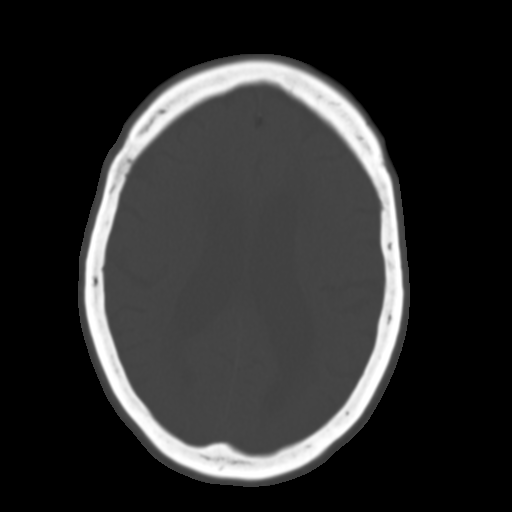
[im 19/29  brain]
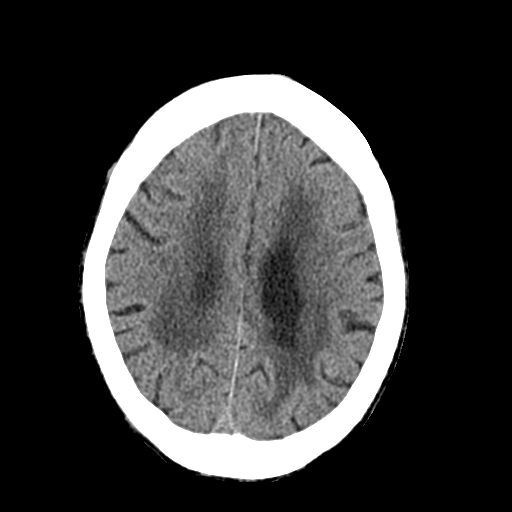
[im 20/29  brain]
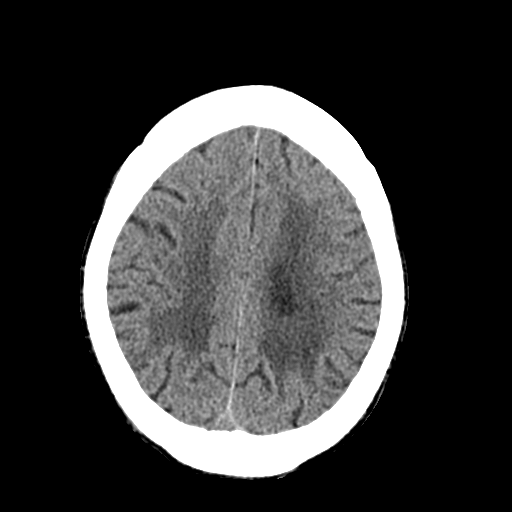
[im 22/29  brain]
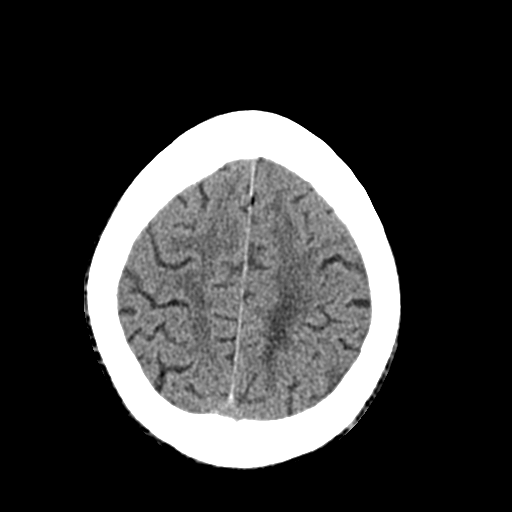
[im 24/29  brain]
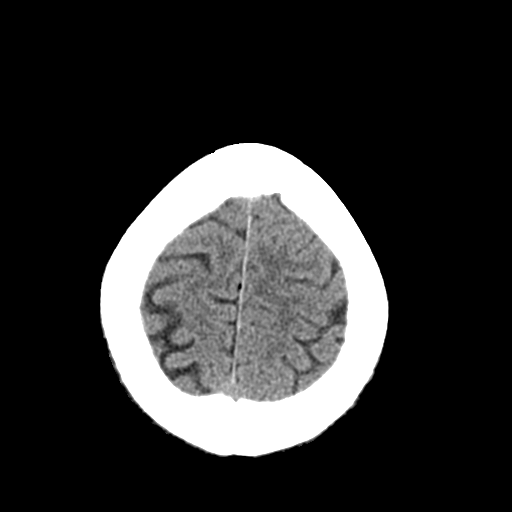
[im 24/29  bone]
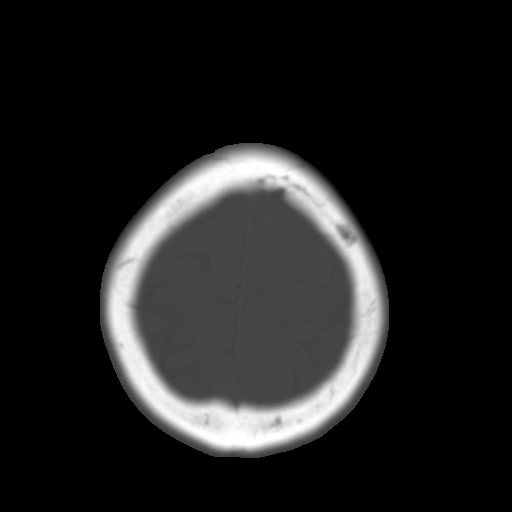
[im 26/29  brain]
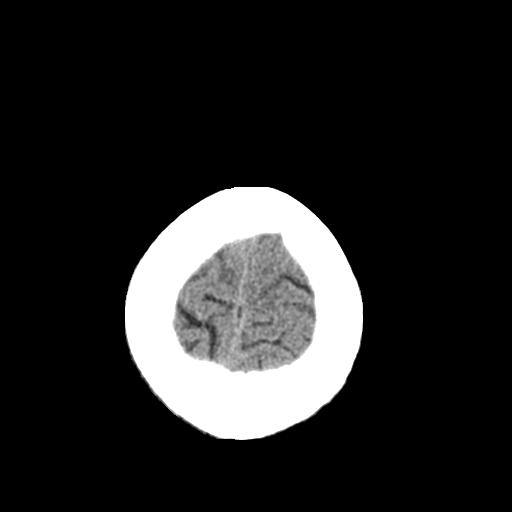
[im 28/29  brain]
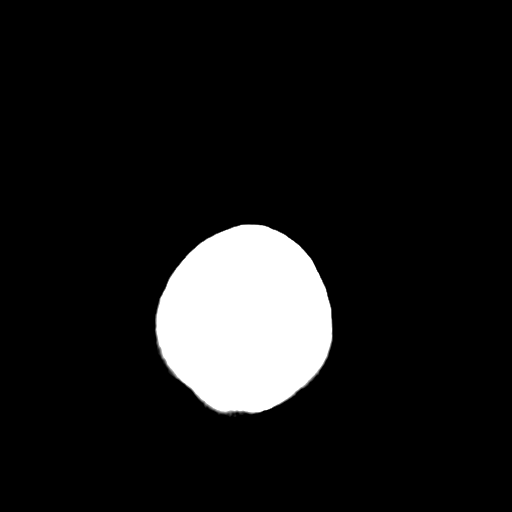

[15 of 29 positions shown; findings below may reference images not displayed]

FINDINGS: The ventricular system remains prominent as are the cortical sulci,
consistent with diffuse atrophy. Moderate small small vessel
ischemic change also is noted throughout the periventricular white
matter. No hemorrhage, mass lesion, or acute infarction is seen.
There is a calcification in the left frontal region peripherally
consistent with and old calcified meningioma which is stable. Also,
on the initial image there is a cystic structure in the midline of
the nasopharynx most consistent with a tornwaldt cyst. On bone
window images, no calvarial abnormality is seen. The paranasal
sinuses appear pneumatized with a probable small retention cyst in
the anterior right maxillary sinus.
IMPRESSION: 1. No significant change in moderate atrophy and small vessel
ischemic change without the periventricular white matter.
2. Tornwaldt cyst impinging on the posterior nasopharyngeal airway.

## 2017-05-31 ENCOUNTER — Other Ambulatory Visit: Payer: Self-pay | Admitting: Diagnostic Neuroimaging

## 2017-06-08 ENCOUNTER — Other Ambulatory Visit: Payer: Self-pay | Admitting: Diagnostic Neuroimaging

## 2017-07-21 ENCOUNTER — Other Ambulatory Visit: Payer: Self-pay | Admitting: Internal Medicine

## 2017-07-21 DIAGNOSIS — E039 Hypothyroidism, unspecified: Secondary | ICD-10-CM

## 2017-07-22 NOTE — Telephone Encounter (Signed)
LOV was 03/2017. Per PCP pt to follow up in 3 months. No appt scheduled.   Will you contact pt (or POA) and schedule her to come in.

## 2017-07-24 NOTE — Telephone Encounter (Signed)
Please close encounter

## 2017-07-24 NOTE — Telephone Encounter (Signed)
Appointment scheduled.

## 2017-07-25 ENCOUNTER — Ambulatory Visit: Payer: Medicare Other | Admitting: Internal Medicine

## 2017-07-30 ENCOUNTER — Encounter: Payer: Self-pay | Admitting: Diagnostic Neuroimaging

## 2017-08-01 ENCOUNTER — Encounter: Payer: Self-pay | Admitting: Internal Medicine

## 2017-08-01 ENCOUNTER — Other Ambulatory Visit (INDEPENDENT_AMBULATORY_CARE_PROVIDER_SITE_OTHER): Payer: Medicare Other

## 2017-08-01 ENCOUNTER — Ambulatory Visit (INDEPENDENT_AMBULATORY_CARE_PROVIDER_SITE_OTHER): Payer: Medicare Other | Admitting: Internal Medicine

## 2017-08-01 VITALS — BP 124/60 | HR 50 | Temp 97.8°F | Resp 16 | Ht 59.0 in | Wt 84.1 lb

## 2017-08-01 DIAGNOSIS — I10 Essential (primary) hypertension: Secondary | ICD-10-CM | POA: Diagnosis not present

## 2017-08-01 DIAGNOSIS — E039 Hypothyroidism, unspecified: Secondary | ICD-10-CM

## 2017-08-01 LAB — BASIC METABOLIC PANEL
BUN: 23 mg/dL (ref 6–23)
CALCIUM: 9.7 mg/dL (ref 8.4–10.5)
CO2: 33 meq/L — AB (ref 19–32)
CREATININE: 0.95 mg/dL (ref 0.40–1.20)
Chloride: 105 mEq/L (ref 96–112)
GFR: 73.02 mL/min (ref >60.00)
GLUCOSE: 125 mg/dL — AB (ref 70–99)
Potassium: 4.8 mEq/L (ref 3.5–5.1)
Sodium: 143 mEq/L (ref 135–145)

## 2017-08-01 LAB — TSH: TSH: 9.55 u[IU]/mL — AB (ref 0.35–4.50)

## 2017-08-01 NOTE — Patient Instructions (Signed)
Hypothyroidism Hypothyroidism is a disorder of the thyroid. The thyroid is a large gland that is located in the lower front of the neck. The thyroid releases hormones that control how the body works. With hypothyroidism, the thyroid does not make enough of these hormones. What are the causes? Causes of hypothyroidism may include:  Viral infections.  Pregnancy.  Your own defense system (immune system) attacking your thyroid.  Certain medicines.  Birth defects.  Past radiation treatments to your head or neck.  Past treatment with radioactive iodine.  Past surgical removal of part or all of your thyroid.  Problems with the gland that is located in the center of your brain (pituitary).  What are the signs or symptoms? Signs and symptoms of hypothyroidism may include:  Feeling as though you have no energy (lethargy).  Inability to tolerate cold.  Weight gain that is not explained by a change in diet or exercise habits.  Dry skin.  Coarse hair.  Menstrual irregularity.  Slowing of thought processes.  Constipation.  Sadness or depression.  How is this diagnosed? Your health care provider may diagnose hypothyroidism with blood tests and ultrasound tests. How is this treated? Hypothyroidism is treated with medicine that replaces the hormones that your body does not make. After you begin treatment, it may take several weeks for symptoms to go away. Follow these instructions at home:  Take medicines only as directed by your health care provider.  If you start taking any new medicines, tell your health care provider.  Keep all follow-up visits as directed by your health care provider. This is important. As your condition improves, your dosage needs may change. You will need to have blood tests regularly so that your health care provider can watch your condition. Contact a health care provider if:  Your symptoms do not get better with treatment.  You are taking thyroid  replacement medicine and: ? You sweat excessively. ? You have tremors. ? You feel anxious. ? You lose weight rapidly. ? You cannot tolerate heat. ? You have emotional swings. ? You have diarrhea. ? You feel weak. Get help right away if:  You develop chest pain.  You develop an irregular heartbeat.  You develop a rapid heartbeat. This information is not intended to replace advice given to you by your health care provider. Make sure you discuss any questions you have with your health care provider. Document Released: 06/11/2005 Document Revised: 11/17/2015 Document Reviewed: 10/27/2013 Elsevier Interactive Patient Education  2018 Elsevier Inc.  

## 2017-08-01 NOTE — Progress Notes (Signed)
Subjective:  Patient ID: [name], female    DOB: [birth year]  Age: 77 y.o. MRN: [id number]  CC: Hypothyroidism   HPI [name] presents for f/up - she is with her daughter today.  The pt has had several falls recently with no injuries.  The daughter states the patient has gone through physical therapy and they do not want any more help within the home such as home health care, PT, or nursing.  There have been no new concerns recently.  Outpatient Medications Prior to Visit  Medication Sig Dispense Refill  . carbidopa-levodopa-entacapone (STALEVO) 25-100-200 MG tablet Take 1 tablet by mouth 3 (three) times daily. 270 tablet 4  . CVS VITAMIN D 2000 units CAPS TAKE 1 CAPSULE (2,000 UNITS TOTAL) BY MOUTH DAILY. 90 capsule 3  . levothyroxine (SYNTHROID, LEVOTHROID) 50 MCG tablet Take 1 tablet (50 mcg total) by mouth daily before breakfast. 90 tablet 1  . levothyroxine (SYNTHROID, LEVOTHROID) 50 MCG tablet TAKE 1 TABLET BY MOUTH EVERY DAY 90 tablet 0   No facility-administered medications prior to visit.     ROS Review of Systems  Constitutional: Positive for fatigue and unexpected weight change. Negative for activity change and appetite change.  HENT: Negative.   Eyes: Negative for visual disturbance.  Respiratory: Negative for cough, chest tightness, shortness of breath and wheezing.   Cardiovascular: Negative for chest pain, palpitations and leg swelling.  Gastrointestinal: Negative for abdominal pain, diarrhea, nausea and vomiting.  Endocrine: Negative for cold intolerance and heat intolerance.  Genitourinary: Negative.  Negative for difficulty urinating.  Musculoskeletal: Negative.   Skin: Negative.   Allergic/Immunologic: Negative.   Neurological: Negative.   Hematological: Negative for adenopathy. Does not bruise/bleed easily.  Psychiatric/Behavioral: Negative.     Objective:  BP 124/60 (BP Location: Left Arm, Patient Position: Sitting, Cuff Size: Small)   Pulse (!)  50   Temp 97.8 F (36.6 C) (Oral)   Resp 16   Ht 4\' 11"  (1.499 m)   Wt 84 lb 1.3 oz (38.1 kg)   SpO2 97%   BMI 16.98 kg/m   BP Readings from Last 3 Encounters:  08/01/17 124/60  04/08/17 128/70  01/08/17 100/60    Wt Readings from Last 3 Encounters:  08/01/17 84 lb 1.3 oz (38.1 kg)  04/08/17 86 lb 4 oz (39.1 kg)  01/08/17 79 lb 8 oz (36.1 kg)    Physical Exam  Constitutional: She is oriented to person, place, and time. No distress.  HENT:  Mouth/Throat: Oropharynx is clear and moist. No oropharyngeal exudate.  Eyes: Conjunctivae are normal. Left eye exhibits no discharge. No scleral icterus.  Neck: Normal range of motion. Neck supple. No JVD present. No thyromegaly present.  Cardiovascular: Normal rate, regular rhythm and normal heart sounds. Exam reveals no gallop.  No murmur heard. Pulmonary/Chest: Effort normal and breath sounds normal. No respiratory distress. She has no wheezes. She has no rales.  Abdominal: Soft. Bowel sounds are normal. She exhibits no distension and no mass. There is no tenderness. There is no rebound.  Musculoskeletal: Normal range of motion. She exhibits no edema, tenderness or deformity.  Lymphadenopathy:    She has no cervical adenopathy.  Neurological: She is alert and oriented to person, place, and time.  Skin: Skin is warm and dry. No rash noted. She is not diaphoretic. No erythema. No pallor.  Psychiatric: She is slowed and withdrawn. She is noncommunicative. She is inattentive.  Vitals reviewed.   Lab Results  Component Value Date  WBC 4.6 04/08/2017   HGB 13.4 04/08/2017   HCT 41.1 04/08/2017   PLT 195.0 04/08/2017   GLUCOSE 125 (H) 08/01/2017   CHOL 187 04/08/2017   TRIG 73.0 04/08/2017   HDL 72.40 04/08/2017   LDLDIRECT 142.2 10/28/2008   LDLCALC 100 (H) 04/08/2017   ALT <6 12/04/2016   AST 17 12/04/2016   NA 143 08/01/2017   K 4.8 08/01/2017   CL 105 08/01/2017   CREATININE 0.95 08/01/2017   BUN 23 08/01/2017   CO2 33  (H) 08/01/2017   TSH 9.55 (H) 08/01/2017   INR 1.18 09/19/2015   HGBA1C 5.4 09/13/2014   MICROALBUR <0.7 09/13/2014    Ct Head Wo Contrast  Result Date: 11/08/2016 CLINICAL DATA:  Pain after fall. EXAM: CT HEAD WITHOUT CONTRAST CT CERVICAL SPINE WITHOUT CONTRAST TECHNIQUE: Multidetector CT imaging of the head and cervical spine was performed following the standard protocol without intravenous contrast. Multiplanar CT image reconstructions of the cervical spine were also generated. COMPARISON:  December 16, 2015 and July 23, 2016 FINDINGS: CT HEAD FINDINGS Brain: No subdural, epidural, or subarachnoid hemorrhage. 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 These findings are unchanged given difference in slice selection. Ventricles and sulci are prominent but stable. Moderate to severe white matter changes remain. No acute cortical ischemia or infarct is identified. The cerebellum, brainstem, and basal cisterns are normal. No mass, mass effect, or midline shift. Vascular: Calcified atherosclerosis is seen in the intracranial carotid arteries. Skull: Normal. Negative for fracture or focal lesion. Sinuses/Orbits: No acute finding. Other: There is a hematoma over the posterior left scalp. CT CERVICAL SPINE FINDINGS Alignment: Minimal anterolisthesis of C3 versus C4 is stable since June of 2017. No other malalignment. Skull base and vertebrae: No acute fracture. No primary bone lesion or focal pathologic process. Soft tissues and spinal canal: No prevertebral fluid or swelling. No visible canal hematoma. Disc levels: Degenerative disc disease and facet degenerative changes are stable. Upper chest: Negative. Other: No other abnormalities identified. IMPRESSION: 1. No acute intracranial abnormality. 2. No fracture or traumatic malalignment in the cervical spine. Degenerative changes. Electronically Signed   By: Dorise Bullion III M.D    On: 11/08/2016 17:14   Ct Cervical Spine Wo Contrast  Result Date: 11/08/2016 CLINICAL DATA:  Pain after fall. EXAM: CT HEAD WITHOUT CONTRAST CT CERVICAL SPINE WITHOUT CONTRAST TECHNIQUE: Multidetector CT imaging of the head and cervical spine was performed following the standard protocol without intravenous contrast. Multiplanar CT image reconstructions of the cervical spine were also generated. COMPARISON:  December 16, 2015 and July 23, 2016 FINDINGS: CT HEAD FINDINGS Brain: No subdural, epidural, or subarachnoid hemorrhage. 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 These findings are unchanged given difference in slice selection. Ventricles and sulci are prominent but stable. Moderate to severe white matter changes remain. No acute cortical ischemia or infarct is identified. The cerebellum, brainstem, and basal cisterns are normal. No mass, mass effect, or midline shift. Vascular: Calcified atherosclerosis is seen in the intracranial carotid arteries. Skull: Normal. Negative for fracture or focal lesion. Sinuses/Orbits: No acute finding. Other: There is a hematoma over the posterior left scalp. CT CERVICAL SPINE FINDINGS Alignment: Minimal anterolisthesis of C3 versus C4 is stable since June of 2017. No other malalignment. Skull base and vertebrae: No acute fracture. No primary bone lesion  or focal pathologic process. Soft tissues and spinal canal: No prevertebral fluid or swelling. No visible canal hematoma. Disc levels: Degenerative disc disease and facet degenerative changes are stable. Upper chest: Negative. Other: No other abnormalities identified. IMPRESSION: 1. No acute intracranial abnormality. 2. No fracture or traumatic malalignment in the cervical spine. Degenerative changes. Electronically Signed   By: Dorise Bullion III M.D   On: 11/08/2016 17:14    Assessment & Plan:   Athina was seen today for  hypothyroidism.  Diagnoses and all orders for this visit:  Acquired hypothyroidism- Her TSH is mildly elevated but it is still <10 so considering her age and comorbid illnesses will continue the current dose of levothyroxine. -     TSH; Future  Essential hypertension- Her blood pressure is well controlled.  Electrolytes and renal function are normal. -     Basic metabolic panel; Future   I am having Jude Linck maintain her CVS VITAMIN D, carbidopa-levodopa-entacapone, levothyroxine, and levothyroxine.  No orders of the defined types were placed in this encounter.    Follow-up: Return in about 6 months (around 01/29/2018).  Scarlette Calico, MD

## 2017-08-23 ENCOUNTER — Other Ambulatory Visit: Payer: Self-pay | Admitting: Internal Medicine

## 2017-09-26 ENCOUNTER — Telehealth: Payer: Self-pay | Admitting: *Deleted

## 2017-09-26 NOTE — Telephone Encounter (Addendum)
Pt aetna form on [employer]. Pt form faxed to [facility].

## 2017-09-26 NOTE — Telephone Encounter (Signed)
[employer] LOA for family member forms placed on Dr [employer] desk for review, signature.

## 2017-09-27 DIAGNOSIS — Z0279 Encounter for issue of other medical certificate: Secondary | ICD-10-CM

## 2017-09-30 NOTE — Telephone Encounter (Signed)
Reviewed and signed by Dr. Leta Baptist.  To Hilda Blades in MR.

## 2017-10-17 NOTE — Telephone Encounter (Signed)
Palliative Care SW spoke with patient's caregiver.  They stated they wished to phone other family members to be present during SW visit and would call SW back to set up appointment.

## 2017-10-22 ENCOUNTER — Telehealth: Payer: Self-pay | Admitting: Licensed Clinical Social Worker

## 2017-10-22 NOTE — Telephone Encounter (Signed)
Palliative Care SW phoned patient's home and spoke with her husband, [name].  Home visit scheduled for 10/25/17, Friday, at 10:30am.

## 2017-10-25 ENCOUNTER — Other Ambulatory Visit: Payer: Federal, State, Local not specified - PPO | Admitting: *Deleted

## 2017-10-25 ENCOUNTER — Other Ambulatory Visit: Payer: Federal, State, Local not specified - PPO | Admitting: Licensed Clinical Social Worker

## 2017-10-25 ENCOUNTER — Encounter: Payer: Self-pay | Admitting: *Deleted

## 2017-10-25 DIAGNOSIS — Z515 Encounter for palliative care: Secondary | ICD-10-CM

## 2017-10-25 NOTE — Progress Notes (Signed)
COMMUNITY PALLIATIVE CARE RN NOTE  PATIENT NAME: [name] DOB: May 29, 1941 MRN: [id number]   PRIMARY CARE PROVIDER: Janith Lima, MD  RESPONSIBLE PARTY:  Acct ID - Guarantor Home Phone Work Phone Relationship Acct Type  [id number] - Uffelman,[phone]  Self P/F     [address], [place], [place] [address]    PLAN OF CARE and INTERVENTIONS:               1.  GOALS OF CARE/ ADVANCE CARE PLANNING: Husband would like wife to remain at home for as long as possible. Currently has a hired caregiver 5x/week for 4 hours each day               2.  PATIENT/CAREGIVER EDUCATION: Explained Palliative Care services. Reinforced Safety Precautions               3. PERSONAL EMERGENCY PLAN: In case of emergencies, husband will contact daughter Malachy Mood who is a Therapist, sports, who will make the necessary decisions               4.  DISEASE STATUS: Joint visit made with Palliative SW. Patient sitting on the couch awake and alert. Responds with 3-5 words and some speech garbled at times. Some drooling noted during visit. Denies pain. While sitting at rest, slow, rhythmic tremor noted involving mainly her upper body. Hired caregiver present during visit. Reports that patient is able to ambulate with stand-by assistance around the home, but on weaker days requires a cane. Observed patient get up from the couch independently and ambulate some around her home. Has shuffled gait and at times is unsteady. Built in shower bench utilized during showers. Some days patient can pull up on grab bars in shower and other days is unable d/t weakness. Family takes patient outside of the home every 2 weeks for a hair appointment and she attends weekly bible study at their church. No recent falls. Patient is on a Regular diet. Eats 75-100% of 3 meals a day along with Ensure with all 3 meals. Continues to appear thin and frail despite good intake. Patient has occasional incontinence of bowel and bladder and wears Depends, but most days is  able to go to the bathroom with assistance. 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 Caregiver massages and opens hand and massages daily to help prevent further contraction.     HISTORY OF PRESENT ILLNESS: This is a 77 yo female with a Parkinson's diagnosis. Husband reports there has not been any physical evidence of decline over the past few months. Palliative Care to continue to follow patient. Next visit scheduled in 1 month.   CODE STATUS: DNR    ADVANCED DIRECTIVES: Y Has HCPOA and Living Will MOST FORM: Form left in the home and husband to discuss with daughter PPS: 40%   PHYSICAL EXAM:   VITALS: Today's Vitals   10/25/17 1157  BP: (!) 90/58  Pulse: (!) 56  Resp: 16  SpO2: 99%  PainSc: 0-No pain    LUNGS: clear to auscultation  CARDIAC: Cor RRR EXTREMITIES: No edema noted SKIN: Skin is warm, dry and intact, no breakdown  NEURO: intermittent confusion, replies with 3-5 words, some garbled speech, slow tremors noted while at rest   (Duration of visit and documentation 90 minutes)    Daryl Eastern, RN, BSN

## 2017-10-25 NOTE — Progress Notes (Signed)
COMMUNITY PALLIATIVE CARE SW NOTE  PATIENT NAME: [name] DOB: March 05, 1941 MRN: [id number]  PRIMARY CARE PROVIDER: Janith Lima, MD  RESPONSIBLE PARTY:  Acct ID - Guarantor Home Phone Work Phone Relationship Acct Type  [id number] - Harte,[phone]  Self P/F     [name], Kristin, [place] [address]     PLAN OF CARE and INTERVENTIONS:             1. GOALS OF CARE/ ADVANCE CARE PLANNING:  Per patient's husband, [name], the goals is for her to remain in her home.  SW and Palliative Care RN, Daryl Eastern, introduced the MOST form.  [name] said he wished to review the document with his daughter, [name], who is a Therapist, sports. 2. SOCIAL/EMOTIONAL/SPIRITUAL ASSESSMENT/ INTERVENTIONS:  Patient is a 77 y/o [place] American female who lives with her husband.  She met her husband in 1958 while they were both attending college in [place], and married in 1962.  Patient worked for the [employer] for 25 years.  [name] was in [employer] for 26 years.  He said the patient and their children loved moving around and seeing the world.  They have lived in [place], [place] and the [place].  Their son is a Engineer, maintenance (IT) in [place].  His son is 83 years old and their only grandchild.  Patient's daughter lives in [place].  Both children are very supportive of patient and her husband.  They are members of [employer]  They attend Bible Study every Wednesday and are transported by the CNA.  She also prepares meals for them.  Patient reported "feeling good" during the visit.  [name] appeared very attentive to patient's needs.  He repeated a few of his stories displaying possible short term memory loss. 3. PATIENT/CAREGIVER EDUCATION/ COPING:  Patient's speech is difficult to understand at times due to her illness. She can state 3-5 words and will express her needs.  SW and RN provided education to the patient and her husband regarding the Villanueva said he understood.  He uses humor as a way of  coping.  He also plays the guitar to help reduce stress. 4. PERSONAL EMERGENCY PLAN:  Currently, patient's husband calls their daughter if there is a medical concern. 5. COMMUNITY RESOURCES COORDINATION/ HEALTH CARE NAVIGATION:  A [facility] CNA provides personal care and assistance with ADLs five days a week for four hours a day. 6. FINANCIAL/LEGAL CONCERNS/INTERVENTIONS:  None per patient's husband.     SOCIAL HX:  Social History   Tobacco Use  . Smoking status: Never Smoker  . Smokeless tobacco: Never Used  Substance Use Topics  . Alcohol use: No    CODE STATUS:  Full Code ADVANCED DIRECTIVES:  Per husband, patient has a HCPOA and Living Will.  HCPOA is patient's daughter. MOST FORM COMPLETE:  No HOSPICE EDUCATION PROVIDED: No  PPS:  Patient stands and ambulates independently.  She has a shuffled gait and requires prompting to sit.  Her intake is normal. Duration of visit and documentation:  90 minutes      Kristin Corn Elainna Eshleman, LCSW

## 2017-11-06 ENCOUNTER — Telehealth: Payer: Self-pay | Admitting: Internal Medicine

## 2017-11-06 NOTE — Telephone Encounter (Signed)
FMLA forms were dropped off for pt's Son, Lawsyn Heiler by his Shanin, Szymanowski.  Please complete forms and call Malachy Mood [phone]) and she will pick them up for her Brother.  Forms put in Brittany's box.

## 2017-11-06 NOTE — Telephone Encounter (Signed)
I do not see where this has been done before, I will call and speak with his son about what he is wanting.  Also it can not be approved until the provider is back in office, which is May 20.

## 2017-11-07 NOTE — Telephone Encounter (Signed)
Spoke with them about these forms. He is needing the forms for taking his mother to doctor visits and in home care. He is requesting 1 or 2 days a week for this.   I see that the Neurologist office has filled out FMLA for the daughters.   LOV : 07/2017 - Please advise.

## 2017-11-09 NOTE — Telephone Encounter (Signed)
Ok with me 

## 2017-11-11 NOTE — Telephone Encounter (Signed)
Forms have been completed & placed in providers box to review and sign.  °

## 2017-11-13 NOTE — Telephone Encounter (Signed)
Forms have been signed, copy sent to scan.  [name] has been informed they are ready to be picked up. & charged for under his chart.

## 2017-11-13 NOTE — Telephone Encounter (Signed)
Pts daughter calling to get an update on forms being signed. Please call with update. May leave voicemail.

## 2017-11-22 ENCOUNTER — Other Ambulatory Visit: Payer: Medicare Other | Admitting: *Deleted

## 2017-11-22 ENCOUNTER — Other Ambulatory Visit: Payer: Federal, State, Local not specified - PPO | Admitting: Licensed Clinical Social Worker

## 2017-11-22 DIAGNOSIS — Z515 Encounter for palliative care: Secondary | ICD-10-CM

## 2017-11-22 NOTE — Progress Notes (Signed)
COMMUNITY PALLIATIVE CARE RN NOTE  PATIENT NAME: [name] DOB: [birth year] MRN: [id number]  PRIMARY CARE PROVIDER: Janith Lima, MD  RESPONSIBLE PARTY:  Acct ID - Guarantor Home Phone Work Phone Relationship Acct Type  [id number] - Caggiano,[phone]  Self P/F     [address], [place], [place] [address]    PLAN OF CARE and INTERVENTION:  1. ADVANCE CARE PLANNING/GOALS OF CARE: Husband wants patient to remain at home and avoid hospitalizations 2. PATIENT/CAREGIVER EDUCATION: Reinforced Safety/Fall Precautions 3. DISEASE STATUS: Joint visit made with Palliative SW Jeani Hawking Duffy. Husband present during visit. Patient was still asleep initially upon arrival. Husband states they often watch television until late so patient sleeps in until mid to late morning. Continues with caregiver 5 days/week to assist with personal care, cooking and light cleaning. Patient remains able to stand and ambulate independently, but gait is unsteady at times. Husband stands by patient for safety and patient will also hold onto furniture. Husband reports that he takes patient outside to walk up to the stop sign at the end of his street at times, which she seems to enjoy. Continues to require 1 person assistance with bathing, dressing and toileting. Patient was able to form complete sentences at start of visit, however as visit progressed started to notice her difficulties in staying focused on subject and with word finding. Some occasional rhythmic movements noted while patient was sitting on the couch d/t Parkinson's, but other times patient completely still. Intake is fair. Patient eating 2 meals/day of small portions. Husband states that he wishes she would eat more. Patient is very thin, frail with obvious muscle wasting. Husband reports weight has been stable this past month. Currently weighs 77 lbs. Takes medications without any complications. Denies any pain. No dyspnea noted. Husband feels patient has been  stable over the past month and has not noticed any evidence of decline.  HISTORY OF PRESENT ILLNESS:  This is a 77 yo female with a Parkinson's diagnosis who lives at home with her husband. Palliative care to continue to follow patient. Next visit scheduled in 1 month.  CODE STATUS: DNR MOST FORM: no PPS: 40%   PHYSICAL EXAM:   VITALS: Today's Vitals   11/22/17 1114  BP: (!) 100/58  Pulse: 61  Resp: 16  SpO2: 98%  PainSc: 0-No pain    LUNGS: clear to auscultation  CARDIAC: Cor RRR EXTREMITIES: No edema SKIN: Exposed skin dry and intact  NEURO: Alert and oriented to self, intermittent confusion, generalized weakness w/unsteady gait   (Duration of visit and documentation 75 minutes)    Daryl Eastern, RN, BSN

## 2017-11-22 NOTE — Progress Notes (Signed)
COMMUNITY PALLIATIVE CARE SW NOTE  PATIENT NAME: [name] DOB: May 31, 1941 MRN: [id number]  PRIMARY CARE PROVIDER: Janith Lima, MD  RESPONSIBLE PARTY:  Acct ID - Guarantor Home Phone Work Phone Relationship Acct Type  [id number] - [name],[name]  Self P/F     [place], [place], [place] [address]   PLAN OF CARE and INTERVENTIONS:             1. GOALS OF CARE/ ADVANCE CARE PLANNING:  The goal is for patient to remain in her home.   2. SOCIAL/EMOTIONAL/SPIRITUAL ASSESSMENT/ INTERVENTIONS:  Patient was sleeping in her room when the Palliative team arrived.  Patient's husband, [name], was able to direct patient to the living room.  She was observed walking independently and was shuffling.  Her verbalizations were more clear during this visit.  She recognized Therapist, sports.  [name] reports patient continues going to church with him.  They also both walk up the street several times for exercise.  Patient denied pain. 3. PATIENT/CAREGIVER EDUCATION/ COPING:  He uses humor as a way of coping.  He also plays the guitar to help reduce stress.  He did repeat himself several times. 4. PERSONAL EMERGENCY PLAN:  Currently, patient's husband calls their daughter if there is a medical concern since she is a Marine scientist. 5. COMMUNITY RESOURCES COORDINATION/ HEALTH CARE NAVIGATION:  A [facility] CNA provides personal care and assistance with ADLs five days a week for four hours a day. 6. FINANCIAL/LEGAL CONCERNS/INTERVENTIONS:  None per patient's husband.     SOCIAL HX:  Social History       Tobacco Use  . Smoking status: Never Smoker  . Smokeless tobacco: Never Used  Substance Use Topics  . Alcohol use: No    CODE STATUS:  Full Code ADVANCED DIRECTIVES:  Per husband, patient has a HCPOA and Living Will.  HCPOA is patient's daughter. MOST FORM COMPLETE:  No HOSPICE EDUCATION PROVIDED: No  PPS:  Patient stands and ambulates independently.  She has a shuffled gait and requires prompting to sit.   Her intake is normal. Duration of visit and documentation:  90 minutes      Kristin Corn Montasia Chisenhall, LCSW

## 2017-12-02 ENCOUNTER — Telehealth: Payer: Self-pay | Admitting: Adult Health

## 2017-12-02 NOTE — Telephone Encounter (Signed)
Patient called to reschedule  °

## 2017-12-06 ENCOUNTER — Encounter: Payer: Medicare Other | Admitting: Adult Health

## 2017-12-09 ENCOUNTER — Encounter: Payer: Self-pay | Admitting: Adult Health

## 2017-12-09 ENCOUNTER — Telehealth: Payer: Self-pay | Admitting: Adult Health

## 2017-12-09 ENCOUNTER — Inpatient Hospital Stay: Payer: Medicare Other | Attending: Adult Health | Admitting: Adult Health

## 2017-12-09 VITALS — BP 103/50 | HR 50 | Temp 98.0°F | Resp 17 | Ht 59.0 in | Wt 82.2 lb

## 2017-12-09 DIAGNOSIS — E039 Hypothyroidism, unspecified: Secondary | ICD-10-CM | POA: Insufficient documentation

## 2017-12-09 DIAGNOSIS — Z86000 Personal history of in-situ neoplasm of breast: Secondary | ICD-10-CM | POA: Diagnosis not present

## 2017-12-09 DIAGNOSIS — I10 Essential (primary) hypertension: Secondary | ICD-10-CM | POA: Diagnosis not present

## 2017-12-09 DIAGNOSIS — C50912 Malignant neoplasm of unspecified site of left female breast: Secondary | ICD-10-CM

## 2017-12-09 DIAGNOSIS — E559 Vitamin D deficiency, unspecified: Secondary | ICD-10-CM | POA: Diagnosis not present

## 2017-12-09 DIAGNOSIS — Z923 Personal history of irradiation: Secondary | ICD-10-CM | POA: Insufficient documentation

## 2017-12-09 DIAGNOSIS — Z170 Estrogen receptor positive status [ER+]: Secondary | ICD-10-CM

## 2017-12-09 NOTE — Progress Notes (Signed)
CLINIC:  Survivorship   REASON FOR VISIT:  Routine follow-up for history of breast cancer.   BRIEF ONCOLOGIC HISTORY:    Breast cancer, left breast ([place])   01/25/2006 Surgery    Left breast lumpectomy 0.25 cm high-grade DCIS with necrosis ER/PR positive      03/27/2006 - 04/21/2006 Radiation Therapy    Radiation to lumpectomy site       Anti-estrogen oral therapy     patient declined tamoxifen        INTERVAL HISTORY:  [name] presents to the [facility] today for routine follow-up for her history of breast cancer.  Overall, she reports feeling quite well. She continues to see her PCP regularly.  She has no breast issues today.     REVIEW OF SYSTEMS:  Review of Systems  Constitutional: Negative for appetite change, chills, fatigue and fever.  HENT:   Negative for hearing loss, lump/mass and trouble swallowing.   Eyes: Negative for eye problems and icterus.  Respiratory: Negative for chest tightness and shortness of breath.   Cardiovascular: Negative for chest pain, leg swelling and palpitations.  Gastrointestinal: Negative for abdominal distention, abdominal pain, constipation, diarrhea, nausea and vomiting.  Endocrine: Negative for hot flashes.  Skin: Negative for itching and rash.  Neurological: Negative for dizziness, extremity weakness and numbness.  Hematological: Negative for adenopathy. Does not bruise/bleed easily.  Psychiatric/Behavioral: Negative for depression. The patient is not nervous/anxious.   Breast: Denies any new nodularity, masses, tenderness, nipple changes, or nipple discharge.       PAST MEDICAL/SURGICAL HISTORY:  Past Medical History:  Diagnosis Date  . Abnormal chest x-ray   . Anxiety   . Breast cancer ([place])    stage 0 left  . Diverticulosis of colon   . DJD (degenerative joint disease)   . Glaucoma   . Hip pain   . Hypercholesteremia   . Hypertension   . Hypothyroid   . Lumbar back pain   . Osteopenia   . Parkinson  disease ([place])   . Peripheral vascular disease ([place])   . Venous insufficiency   . Vitamin D deficiency    Past Surgical History:  Procedure Laterality Date  . BREAST LUMPECTOMY  8/11 by [facility]  . cataract surgery and argon laser trabeculoplasty  04/2010   Dr. Herbert Deaner  . CHOLECYSTECTOMY    . COLONOSCOPY    . INCISE AND DRAIN ABCESS  10/04/11   sebaceous cyst on back  . lapraoscopic cholecystectomy  09/2003   Dr. Dalbert Batman  . lumbar laminectomy and foraminotomies for spinal stenosis  11/2005   Dr. Gladstone Lighter  . LUMBAR LAMINECTOMY/DECOMPRESSION MICRODISCECTOMY  05/20/2012   Procedure: LUMBAR LAMINECTOMY/DECOMPRESSION MICRODISCECTOMY 2 LEVELS;  Surgeon: Tobi Bastos, MD;  Location: [facility];  Service: Orthopedics;  Laterality: N/A;  . OVARIAN CYST REMOVAL    . POLYPECTOMY       ALLERGIES:  No Known Allergies   CURRENT MEDICATIONS:  Outpatient Encounter Medications as of 12/09/2017  Medication Sig  . carbidopa-levodopa-entacapone (STALEVO) 25-100-200 MG tablet Take 1 tablet by mouth 3 (three) times daily.  . Cholecalciferol ([employer] D3) 2000 units CAPS Take 1 capsule (2,000 Units total) by mouth daily.  Marland Kitchen levothyroxine (SYNTHROID, LEVOTHROID) 50 MCG tablet Take 1 tablet (50 mcg total) by mouth daily before breakfast.  . [DISCONTINUED] levothyroxine (SYNTHROID, LEVOTHROID) 50 MCG tablet TAKE 1 TABLET BY MOUTH EVERY DAY (Patient not taking: Reported on 12/09/2017)   No facility-administered encounter medications on file as of 12/09/2017.  ONCOLOGIC FAMILY HISTORY:  Family History  Problem Relation Age of Onset  . Colon cancer Father   . Osteoporosis Neg Hx       SOCIAL HISTORY:  Social History   Socioeconomic History  . Marital status: Married    Spouse name: [name]  . Number of children: 2  . Years of education: Assoc  . Highest education level: Not on file  Occupational History  . Occupation: retired    Fish farm manager: RETIRED  Social Needs  . Financial resource strain:  Not on file  . Food insecurity:    Worry: Not on file    Inability: Not on file  . Transportation needs:    Medical: Not on file    Non-medical: Not on file  Tobacco Use  . Smoking status: Never Smoker  . Smokeless tobacco: Never Used  Substance and Sexual Activity  . Alcohol use: No  . Drug use: No  . Sexual activity: Not Currently  Lifestyle  . Physical activity:    Days per week: Not on file    Minutes per session: Not on file  . Stress: Not on file  Relationships  . Social connections:    Talks on phone: Not on file    Gets together: Not on file    Attends religious service: Not on file    Active member of club or organization: Not on file    Attends meetings of clubs or organizations: Not on file    Relationship status: Not on file  . Intimate partner violence:    Fear of current or ex partner: Not on file    Emotionally abused: Not on file    Physically abused: Not on file    Forced sexual activity: Not on file  Other Topics Concern  . Not on file  Social History Narrative   Pt lives at home with spouse. 12/19/16 aid every day for 4 hours   Caffeine Use: Very little; once a week      PHYSICAL EXAMINATION:  Vital Signs: Vitals:   12/09/17 1126  BP: (!) 103/50  Pulse: (!) 50  Resp: 17  Temp: 98 F (36.7 C)  SpO2: 99%   Filed Weights   12/09/17 1126  Weight: 82 lb 3.2 oz (37.3 kg)   General: Well-nourished, well-appearing female in no acute distress.  accompanied by her daughter today.   HEENT: Head is normocephalic.  Pupils equal and reactive to light. Conjunctivae clear without exudate.  Sclerae anicteric. Oral mucosa is pink, moist.  Oropharynx is pink without lesions or erythema.  Lymph: No cervical, supraclavicular, or infraclavicular lymphadenopathy noted on palpation.  Cardiovascular: Regular rate and rhythm.Marland Kitchen Respiratory: Clear to auscultation bilaterally. Chest expansion symmetric; breathing non-labored.  Breast Exam:  -Left breast: No  appreciable masses on palpation. No skin redness, thickening, or peau d'orange appearance; no nipple retraction or nipple discharge; mild distortion in symmetry at previous lumpectomy site well healed scar without erythema or nodularity.  -Right breast: No appreciable masses on palpation. No skin redness, thickening, or peau d'orange appearance; no nipple retraction or nipple discharge; -Axilla: No axillary adenopathy bilaterally.  GI: Abdomen soft and round; non-tender, non-distended. Bowel sounds normoactive. No hepatosplenomegaly.   GU: Deferred.  Neuro: No focal deficits. Steady gait.  Psych: Mood and affect normal and appropriate for situation.  MSK: No focal spinal tenderness to palpation, full range of motion in bilateral upper extremities Extremities: No edema. Skin: Warm and dry.  LABORATORY DATA:  None for this visit  DIAGNOSTIC IMAGING:  Most recent mammogram:    Most recent bone density:     ASSESSMENT AND PLAN:  Ms.. [name] is a pleasant 77 y.o. 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  [facility] for surveillance and routine follow-up.   1. History of breast cancer:  Ms. [name] is currently clinically and radiographically without evidence of disease or recurrence of breast cancer. She will be due for mammogram in 02/2018.  Given that [name] is now more than 10 years out from her non invasive breast cancer diagnosis, and after discussion with her and her daughter, we decided to let her graduate, and for her to see her PCP for surveillance from now on.    2. Bone health:  Given Ms. [name] age, history of breast cancer, she is at risk for bone demineralization. Her last DEXA was in 02/2017 and was improved, from osteoporosis two years prior, to osteopenia now.  I willd efer ot her PCP regarding future bone density testing and  management.  She was given education on specific food and activities to promote bone health.  3. Cancer screening:  Due to Ms. [name]'s history and her age, she should receive screening for skin cancers. She was encouraged to follow-up with her PCP for appropriate cancer screenings.   4. Health maintenance and wellness promotion: Ms. [name] was encouraged to consume 5-7 servings of fruits and vegetables per day. She was also encouraged to engage in moderate to vigorous exercise as tolerated, most days of the week. She was instructed to limit her alcohol consumption and continue to abstain from tobacco use.    Dispo:  -Return to cancer center PRN -Mammogram 02/2018   A total of (30) minutes of face-to-face time was spent with this patient with greater than 50% of that time in counseling and care-coordination.   [name], [place] [phone]   Note: PRIMARY CARE PROVIDER Janith Lima, [place] [phone]

## 2017-12-09 NOTE — Telephone Encounter (Signed)
No 6/17 los  °

## 2017-12-19 ENCOUNTER — Telehealth: Payer: Self-pay | Admitting: Licensed Clinical Social Worker

## 2017-12-19 NOTE — Telephone Encounter (Signed)
Palliative Care SW phoned patient's daughter, [name], and informed her that the SW and RN would be making a home visit tomorrow.  She stated she has noticed a steady decline in her mother.  She also said she has had some falls.  She expressed appreciation for the call and may try to attend the home visit.

## 2017-12-20 ENCOUNTER — Other Ambulatory Visit: Payer: Medicare Other | Admitting: *Deleted

## 2017-12-20 ENCOUNTER — Other Ambulatory Visit: Payer: Medicare Other | Admitting: Licensed Clinical Social Worker

## 2017-12-20 ENCOUNTER — Ambulatory Visit: Payer: Medicare Other | Admitting: Diagnostic Neuroimaging

## 2017-12-20 DIAGNOSIS — Z515 Encounter for palliative care: Secondary | ICD-10-CM

## 2017-12-20 NOTE — Progress Notes (Signed)
COMMUNITY PALLIATIVE CARE SW NOTE  PATIENT NAME: [name] DOB: 01-25-1941 MRN: [id number]  PRIMARY CARE PROVIDER: Janith Lima, MD  RESPONSIBLE PARTY:  Acct ID - Guarantor Home Phone Work Phone Relationship Acct Type  [id number] - [place],[phone]  Self P/F     [place], [place], [place] [address]     PLAN OF CARE and INTERVENTIONS:             1. GOALS OF CARE/ ADVANCE CARE PLANNING:  Patient's husband, [name], wants patient to remain at home. 2. SOCIAL/EMOTIONAL/SPIRITUAL ASSESSMENT/ INTERVENTIONS:  SW and Palliative Care RN, Daryl Eastern, met with patient and her husband, [name].  Patient was sitting in a chair in the living room.  She appeared to be watching tv.  She answered questions appropriately.  She denied pain.  Patient stood independently with her cane and ambulated to the door when the visit was complete.  [name] repeated stories he had told SW during previous visits.  SW provided active listening and supportive counseling. 3. PATIENT/CAREGIVER EDUCATION/ COPING:  Continue providing education regarding the Palliative Care program.  [name] stated he was confused because Hospice assessed patient several months ago.  He stated he understood. 4. PERSONAL EMERGENCY PLAN:  [name] contacts patient's daughter, who is an Therapist, sports, for guidance. 5. COMMUNITY RESOURCES COORDINATION/ HEALTH CARE NAVIGATION:  A [facility] CNA provides personal care and assistance with ADLs five days a week for four hours a day. 6. FINANCIAL/LEGAL CONCERNS/INTERVENTIONS:  None per husband.     SOCIAL HX:  Social History   Tobacco Use  . Smoking status: Never Smoker  . Smokeless tobacco: Never Used  Substance Use Topics  . Alcohol use: No    CODE STATUS:   Full Code  ADVANCED DIRECTIVES:  Yes  MOST FORM COMPLETE:  No HOSPICE EDUCATION PROVIDED:  No  PPS:  Patient stand and ambulates independently.  Her intake is normal per her husband. Duration of visit and documentation:  60  minutes.      Creola Corn Juleon Narang, LCSW

## 2017-12-23 NOTE — Progress Notes (Signed)
COMMUNITY PALLIATIVE CARE RN NOTE  PATIENT NAME: [name] DOB: 28-Oct-1940 MRN: [id number]  PRIMARY CARE PROVIDER: Janith Lima, MD  RESPONSIBLE PARTY:  Acct ID - Guarantor Home Phone Work Phone Relationship Acct Type  [id number] - [place],[phone]  Self P/F     [place], [place], [place] [address]    PLAN OF CARE and INTERVENTION:  1. ADVANCE CARE PLANNING/GOALS OF CARE: Remain at home with her husband, Avoid hospitalizations 2. PATIENT/CAREGIVER EDUCATION: Reinforced Safety/Fall Precautions 3. DISEASE STATUS: Joint visit made with [place]. Patient sitting up in chair in living room awake and alert. Patient pleasant and smiling. Husband and hired caregiver present during visit. Denies pain. No dyspnea. Able to answer some simple questions clearly, however at times speech becomes slurred and garbled. Remains able to ambulate using her cane or holding onto furniture. Able to stand without assistance. Continues to require assistance with feeding, dressing and bathing as well. Intake fair. Husband reports patient mainly eating 2 meals/day. Patient has gained 5 lbs over the past 2 months. Current wt 82 lbs. Takes medications and swallows without difficulty. Occasional tremors but appear well controlled on current medications. Incontinent of bowel and bladder and wears Depends. Husband continues to take patient to church most Sundays and Bible Study on Wednesdays which patient enjoys. Sleeps well throughout the night. Patient was recently seen by Oncologist on 12/09/17 for follow up due to hx of breast cancer. No abnormalities found. Current condition is stable.    HISTORY OF PRESENT ILLNESS:  This is a 77 yo female who resides at home with her husband and assistance of a hired caregiver 5 days/week (4 hours each day). Continues to be followed by Palliative Care Services. Next visit scheduled in 1 month.   CODE STATUS: DNR  ADVANCED DIRECTIVES: Y MOST FORM:  no PPS: 40%   PHYSICAL EXAM:   VITALS: Today's Vitals   12/20/17 1111  BP: 92/62  Pulse: (!) 59  Resp: 14  SpO2: 100%  PainSc: 0-No pain    LUNGS: clear to auscultation  CARDIAC: Cor Brady  EXTREMITIES: No edema, L hand contracture SKIN: Exposed skin dry and intact  NEURO: Alert to self only, intermittent confusion, pleasant mood, ambulates with cane, generalized weakness    (Duration of visit and documentation 60 minutes)   Daryl Eastern, RN, BSN

## 2018-01-17 ENCOUNTER — Other Ambulatory Visit: Payer: Medicare Other | Admitting: *Deleted

## 2018-01-17 ENCOUNTER — Other Ambulatory Visit: Payer: Medicare Other | Admitting: Licensed Clinical Social Worker

## 2018-01-17 DIAGNOSIS — Z515 Encounter for palliative care: Secondary | ICD-10-CM

## 2018-01-17 NOTE — Progress Notes (Signed)
COMMUNITY PALLIATIVE CARE SW NOTE  PATIENT NAME: [name] DOB: December 27, 1940 MRN: [id number]  PRIMARY CARE PROVIDER: Janith Lima, MD  RESPONSIBLE PARTY:  Acct ID - Guarantor Home Phone Work Phone Relationship Acct Type  [id number] - [place],[phone]  Self P/F     [place], [place], [place] [address]     PLAN OF CARE and INTERVENTIONS:             1. GOALS OF CARE/ ADVANCE CARE PLANNING:  For patient to remain at home.  Patient is a full code. 2. SOCIAL/EMOTIONAL/SPIRITUAL ASSESSMENT/ INTERVENTIONS:  SW and Palliative Care RN, Daryl Eastern, met with patient, her husband, [name], and patient's caregiver, [name].  Patient was eating a sandwich at the kitchen table.  She did not respond to direct contact with SW.  There were no signs of tremors.  Patient would occassionnally answer a question or smile, but appears more withdrawn.  [name] continues telling the same stories as last visit regarding his relationship with patient and his retirement.  Patient denied pain.  She requires more assistance with ADLs. 3. PATIENT/CAREGIVER EDUCATION/ COPING:  Provided education regarding Palliative care.  Patient copes by answering questions with only a few words. 4. PERSONAL EMERGENCY PLAN:  Husband will call their daughter, who is a Marine scientist. 5. COMMUNITY RESOURCES COORDINATION/ HEALTH CARE NAVIGATION:  A [facility] CNA provides personal care and assistance with ADLs five days a week for four hours a day. 6. FINANCIAL/LEGAL CONCERNS/INTERVENTIONS:  None per husband.     SOCIAL HX:  Social History   Tobacco Use  . Smoking status: Never Smoker  . Smokeless tobacco: Never Used  Substance Use Topics  . Alcohol use: No    CODE STATUS:  Full code.  ADVANCED DIRECTIVES:  Yes MOST FORM COMPLETE:  No HOSPICE EDUCATION PROVIDED:  No PPS:  Patient's intake is normal per her husband.  She appeared to have more difficulty ambulating independently. Duration of visit and documentation:  60  minutes.      Creola Corn Coulton Schlink, LCSW

## 2018-01-21 NOTE — Progress Notes (Signed)
COMMUNITY PALLIATIVE CARE RN NOTE  PATIENT NAME: [name] DOB: September 08, 1940 MRN: [id number]  PRIMARY CARE PROVIDER: Janith Lima, MD  RESPONSIBLE PARTY:  Acct ID - Guarantor Home Phone Work Phone Relationship Acct Type  [id number] - [place],[phone]  Self P/F     [place], [place], [place] [address]    PLAN OF CARE and INTERVENTION:  1. ADVANCE CARE PLANNING/GOALS OF CARE: Remain at home with her husband, avoid hospitalizations 2. PATIENT/CAREGIVER EDUCATION: Reinforced Safety/Fall Precautions 3. DISEASE STATUS: Joint visit made with [place]. Patient sitting in the chair in the living room upon arrival. Pleasant and smiling. Very quiet, but will answer questions when asked. No tremors noted today. Denies pain. Intake good per husband's report and able to feed herself. Takes medications without difficulty. Patient very thin/frail with apparent muscle wasting. Husband states that patient is not wandering around the home as often. More sedentary overall. Husband continues to take patient outside of the home to Bible study on Wednesdays and to church on Sundays. Continues to require 1 person assistance with bathing, dressing and ambulation. Incontinent of both bowel and bladder. Husband takes patient on walks outside about 3 times per week. Continues with hired caregivers 5 days/week (4 hours each day).  HISTORY OF PRESENT ILLNESS:  This is a 78 yo female who resides at home with her husband. Palliative Care continues to follow patient. Next visit scheduled in 1 month.  CODE STATUS: DNR  ADVANCED DIRECTIVES: Y MOST FORM: no PPS: 40%   PHYSICAL EXAM:   VITALS: Today's Vitals   01/17/18 1210  BP: 108/64  Pulse: (!) 57  Resp: 15  SpO2: 99%  PainSc: 0-No pain    LUNGS: clear to auscultation  CARDIAC: Cor Brady EXTREMITIES: No edema SKIN: Exposed skin dry and intact, L hand contracture  NEURO: Alert and oriented x 1, pleasant mood, generalized  weakness, ambulatory with assistance   (Duration of visit and documentation 60 minutes)    Daryl Eastern, RN, BSN

## 2018-01-28 ENCOUNTER — Other Ambulatory Visit: Payer: Self-pay | Admitting: Diagnostic Neuroimaging

## 2018-02-11 ENCOUNTER — Encounter: Payer: Self-pay | Admitting: Internal Medicine

## 2018-02-14 ENCOUNTER — Other Ambulatory Visit: Payer: Medicare Other | Admitting: Licensed Clinical Social Worker

## 2018-02-14 ENCOUNTER — Other Ambulatory Visit: Payer: Medicare Other | Admitting: *Deleted

## 2018-02-14 DIAGNOSIS — Z515 Encounter for palliative care: Secondary | ICD-10-CM

## 2018-02-14 NOTE — Progress Notes (Signed)
COMMUNITY PALLIATIVE CARE SW NOTE  PATIENT NAME: Dezirae Service DOB: 09/25/1940 MRN: [id number]  PRIMARY CARE PROVIDER: Janith Lima, MD  RESPONSIBLE PARTY:  Acct ID - Guarantor Home Phone Work Phone Relationship Acct Type  [id number] - [place],[phone]  Self P/F     [place], [place], [place] [address]     PLAN OF CARE and INTERVENTIONS:             1. GOALS OF CARE/ ADVANCE CARE PLANNING:  Husband wishes for patient to remain at home.  She is a full code. 2. SOCIAL/EMOTIONAL/SPIRITUAL ASSESSMENT/ INTERVENTIONS:  SW and Palliative Care RN, Daryl Eastern, met with patient and her husband, [name].  Patient appeared more alert during this visit.  She answered questions with minimal prompting and displayed a bright affect at times.  Patient's husband repeats the same stories during visits.  He appears to enjoy participating in life review.  Patient denied pain.  [name] stated patient has not had any falls. 3. PATIENT/CAREGIVER EDUCATION/ COPING:  Patent copes by answering questions with 1-3 words.  Continue providing education regarding Palliative Care. 4. PERSONAL EMERGENCY PLAN:  Patient's husband calls their daughter, who is a Marine scientist. 5. COMMUNITY RESOURCES COORDINATION/ HEALTH CARE NAVIGATION:  A [facility] CNA provides personal care and assistance with ADLs five days per week for four hours a day. 6. FINANCIAL/LEGAL CONCERNS/INTERVENTIONS:  None per husband.     SOCIAL HX:  Social History   Tobacco Use  . Smoking status: Never Smoker  . Smokeless tobacco: Never Used  Substance Use Topics  . Alcohol use: No    CODE STATUS:  Full Code  ADVANCED DIRECTIVES:  Yes MOST FORM COMPLETE:  No HOSPICE EDUCATION PROVIDED: Husband expressed being somewhat familiar with Hospice. PPS:  Patient's intake is normal per her husband.  She was observed standing and ambulating independently.  Her gait was unsteady and she forgot to use her cane at times. Duration of visit and  documentation:  75 minutes.     Creola Corn Kemoni Quesenberry, LCSW

## 2018-02-24 NOTE — Progress Notes (Signed)
COMMUNITY PALLIATIVE CARE RN NOTE  PATIENT NAME: Nevae Pinnix DOB: [name] 07, 1941 MRN: [id number]  PRIMARY CARE PROVIDER: Janith Lima, MD  RESPONSIBLE PARTY:  Acct ID - Guarantor Home Phone Work Phone Relationship Acct Type  [id number] - [place],[phone]  Self P/F     [employer], [place], [place] [address]    PLAN OF CARE and INTERVENTION:  1. ADVANCE CARE PLANNING/GOALS OF CARE: Remain at home with her husband, avoid hospitalizations 2. PATIENT/CAREGIVER EDUCATION: Reinforced Safety/Fall Precautions 3. DISEASE STATUS: Joint visit made with [place]. Patient sitting up in a chair in the living room awake and alert. Able to answer simple questions with short replies. At times she would stand up, with assistance of her cane out of her chair and walk into the kitchen. Shuffled gait and unsteady at times. Most of the time, husband walks with her when she does this. Continues to require 1 person assistance with bathing/dressing. Able to feed herself independently. Took all medications with water without difficulty during visit. Continues to eat 2-3 meals/day. She is very thin/frail appearing. Husband takes her to bible study on Wednesdays and church most Sundays. Continues with caregivers for several hours 5 times/week.   HISTORY OF PRESENT ILLNESS:  This is a 77 yo female who resides in her home with her husband. Palliative Care continues to follow patient to assess overall condition and assist with symptom management needs. Next visit scheduled in 1 month.  CODE STATUS: DNR  ADVANCED DIRECTIVES: Y MOST FORM: no PPS: 40%   PHYSICAL EXAM:   VITALS: Today's Vitals   02/14/18 1042  BP: 108/64  Pulse: 67  Resp: 16  SpO2: 97%  PainSc: 0-No pain    LUNGS: clear to auscultation  CARDIAC: Cor RRR EXTREMITIES: No edema SKIN: Exposed skin dry and intact, thin/frail skin  NEURO: Alert and oriented to person/place, intermittent confusion, ambulatory with cane  and standby assistance   (Duration of visit and documentation 75 minutes)    Daryl Eastern, RN, BSN

## 2018-02-25 ENCOUNTER — Encounter: Payer: Self-pay | Admitting: Diagnostic Neuroimaging

## 2018-02-25 ENCOUNTER — Ambulatory Visit (INDEPENDENT_AMBULATORY_CARE_PROVIDER_SITE_OTHER): Payer: Medicare Other | Admitting: Diagnostic Neuroimaging

## 2018-02-25 VITALS — BP 132/64 | HR 49 | Ht 59.0 in | Wt 81.0 lb

## 2018-02-25 DIAGNOSIS — F0390 Unspecified dementia without behavioral disturbance: Secondary | ICD-10-CM

## 2018-02-25 DIAGNOSIS — F03C0 Unspecified dementia, severe, without behavioral disturbance, psychotic disturbance, mood disturbance, and anxiety: Secondary | ICD-10-CM

## 2018-02-25 DIAGNOSIS — G20 Parkinson's disease: Secondary | ICD-10-CM | POA: Diagnosis not present

## 2018-02-25 MED ORDER — CARBIDOPA-LEVODOPA-ENTACAPONE 25-100-200 MG PO TABS: 1.0000 | ORAL_TABLET | Freq: Three times a day (TID) | ORAL | 4 refills | Status: AC

## 2018-02-25 NOTE — Progress Notes (Signed)
[facility]  PATIENT: [name] DOB: 01-11-1941  REFERRING CLINICIAN:  HISTORY FROM: patient and daughter REASON FOR VISIT: follow up   HISTORICAL  CHIEF COMPLAINT:  Chief Complaint  Patient presents with  . Follow-up  . PD/ Dementia    Having inside home falls, (hit head x 2 per daughter).  Loves with husband. Is under Palliative care.  Daughter, Malachy Mood.      HISTORY OF PRESENT ILLNESS:   UPDATE (02/25/18, VRP): Since last visit, doing worse. Symptoms are progressive. Gait is worse. More falls. Last fall a few weeks ago. Tolerating meds. Hallucinations mild (stable) --> small children; non-threatening.   UPDATE 12/19/16: Since last visit, sxs progressing. Patient able to escape home and wander down street last month. Having more falls. Went to ER for fall and occipital hematoma. Now using cane more ofter. Hallucinations are non-threatening and stable (seeing children). Tolerating meds.   UPDATE 12/20/15: Since last visit, more falls (including last Friday). Has aid 4 hours x 3 days per week. Tolerating meds, but difficult to take 4 x per day (stalevo) due to sleep and eating schedule. No wearing off per family.   UPDATE 11/16/14: Since last visit, still falling, more memory problems. Tolerating meds. Sitter comes to home 3 hrs per week. Pt hs 24 hr supervision (mainly by husband). He has looked into adult daycare center nearby their home.  UPDATE 05/18/14: Since last visit, many more falls, usually when patient is alone and tries to walk down steps or take a shower unsupervised. Husband has sitter now when he needs to go out. Not much benefit with azilect. Tremor under control. Some head nodding behavior.   UPDATE 02/22/14: Since last visit, more falls. No warning. Has occurred with getting out of bed, bending over, making the bed. Husband hears a "thud" in the other room and finds her on the floor, no confusion or LOC. Hallucinations stable. No wearing off  except in early AM before 1st dose stalevo.   UPDATE 12/11/13: Since last visit, was stable until 3 weeks ago. Now with more confusion, intermittent, sometimes not recognizing her family. Sometimes she thinks [place] is at the bus [place], even though she is standing in her home. No fevers, chills, cough, diarrhea, pain, SOB, CP, N, V. Non threatening visual hallucinations are stable. Memory worsening. Could not tolerate exelon patch (slightly paranoid about its effects).   UPDATE 10/30/12: Since last visit patient is stable. Reduced dosing of Stalevo seems to have improved her hallucinations, but her handwriting has worsened. She still sees children and people in her home, but according to the husband this is much less frequent. He has had to adjust some of the objects in his home which were bothering her. For example plants or water heaters high up in the room, or radiators on the floor, seem to bother her more. Once he removed these from her view she felt better. Tremor, gait and balance are stable. No falls. She does have more stooped posture than before but this seems to be stable since her back surgery. She is having some or drooling. She's having more runny nose. She keeps a napkin with her to wipe her nose. Patient's husband reports more short-term memory loss than before.  UPDATE 09/05/12: Since last visit, has reduced stalevo to QID. no change in hallucinations or parkinson's symptoms. until 1 month ago, some progression of confusion, memory, hallucinations and paranoia. this fluctuates throughout the day.  UPDATE 07/07/12: Since last visit, tried seroquel  x 2 weeks, but hallucinations worsened. Now off seroquel. Also had low back surgery (nov 2013), and went to rehab. Now back home. Stalevo dosing stable.  UPDATE 04/16/12: Never picked up seroquel after last visit. she didn't know that i rx'd this, even though we discussed it. will try rx again. hallucinations are stable. some more stiffness in hips  lately. on 5 tabs stalevo per day.  UPDATE 08/14/11:  Continues to have hallucinations, mostly at night, denies being afraid but feels they are bothersome.  Has not thought about calling 911.  Realizes they are hallucinations.  Took Stalevo 4 times per day for one week without improvement of hallucinations, no change in parkinson's symptoms nowback up to 5 times a day.  She is interested in trying other medications for relief.  Also verbalizes concerns of choking episodes in her sleep.  Occurs approximately 3-4 times per month.  Denies choking episodes with eating or drinking.  PRIOR HPI: 77 year old female with history of hypothyroidism, hypertension, peripheral vascular disease presenting for evaluation and management of Parkinson's disease.  She is accompanied by her husband. In 2004 patient began to develop tremor in her bilateral upper extremities. She was diagnosed with Parkinson's disease. She was initially treated with Mirapex and Sinemet by Dr. Jacolyn Reedy ([place]).  She began to develop hallucinations and was ultimately changed to Stalevo by Dr. Marzetta Board [facility]).  She has done fairly well on this medication. She takes Stalevo 37.5/150/200, 1 tab five times a day (7am, 11am, 3pm, 7pm, bedtime).  She notices wearing off around 4 hours after her dose. She has not had any falls. She denies hallucinations or constipation.  She would like to re-establish care with a local neurologist.   REVIEW OF SYSTEMS: Full 14 system review of systems performed and negative except for: hallucinations confusion incontinence fatigue memory loss.   ALLERGIES: No Known Allergies  HOME MEDICATIONS: Outpatient Medications Prior to Visit  Medication Sig Dispense Refill  . carbidopa-levodopa-entacapone (STALEVO) 25-100-200 MG tablet TAKE 1 TABLET BY MOUTH THREE TIMES A DAY 270 tablet 4  . Cholecalciferol ([facility] D3) 2000 units CAPS Take 1 capsule (2,000 Units total) by mouth daily. 90 capsule 3  . levothyroxine  (SYNTHROID, LEVOTHROID) 50 MCG tablet Take 1 tablet (50 mcg total) by mouth daily before breakfast. 90 tablet 1   No facility-administered medications prior to visit.     PAST MEDICAL HISTORY: Past Medical History:  Diagnosis Date  . Abnormal chest x-ray   . Anxiety   . Breast cancer ([place])    stage 0 left  . Diverticulosis of colon   . DJD (degenerative joint disease)   . Glaucoma   . Hip pain   . Hypercholesteremia   . Hypertension   . Hypothyroid   . Lumbar back pain   . Osteopenia   . Parkinson disease ([place])   . Peripheral vascular disease ([place])   . Venous insufficiency   . Vitamin D deficiency     PAST SURGICAL HISTORY: Past Surgical History:  Procedure Laterality Date  . BREAST LUMPECTOMY  8/11 by [place]  . cataract surgery and argon laser trabeculoplasty  04/2010   Dr. Herbert Deaner  . CHOLECYSTECTOMY    . COLONOSCOPY    . INCISE AND DRAIN ABCESS  10/04/11   sebaceous cyst on back  . lapraoscopic cholecystectomy  09/2003   Dr. Dalbert Batman  . lumbar laminectomy and foraminotomies for spinal stenosis  11/2005   Dr. Gladstone Lighter  . LUMBAR LAMINECTOMY/DECOMPRESSION MICRODISCECTOMY  05/20/2012   Procedure: LUMBAR  LAMINECTOMY/DECOMPRESSION MICRODISCECTOMY 2 LEVELS;  Surgeon: Tobi Bastos, MD;  Location: [facility];  Service: Orthopedics;  Laterality: N/A;  . OVARIAN CYST REMOVAL    . POLYPECTOMY      FAMILY HISTORY: Family History  Problem Relation Age of Onset  . Colon cancer Father   . Osteoporosis Neg Hx     SOCIAL HISTORY:  Social History   Socioeconomic History  . Marital status: Married    Spouse name: [name]  . Number of children: 2  . Years of education: Assoc  . Highest education level: Not on file  Occupational History  . Occupation: retired    Fish farm manager: RETIRED  Social Needs  . Financial resource strain: Not on file  . Food insecurity:    Worry: Not on file    Inability: Not on file  . Transportation needs:    Medical: Not on file    Non-medical:  Not on file  Tobacco Use  . Smoking status: Never Smoker  . Smokeless tobacco: Never Used  Substance and Sexual Activity  . Alcohol use: No  . Drug use: No  . Sexual activity: Not Currently  Lifestyle  . Physical activity:    Days per week: Not on file    Minutes per session: Not on file  . Stress: Not on file  Relationships  . Social connections:    Talks on phone: Not on file    Gets together: Not on file    Attends religious service: Not on file    Active member of club or organization: Not on file    Attends meetings of clubs or organizations: Not on file    Relationship status: Not on file  . Intimate partner violence:    Fear of current or ex partner: Not on file    Emotionally abused: Not on file    Physically abused: Not on file    Forced sexual activity: Not on file  Other Topics Concern  . Not on file  Social History Narrative   Pt lives at home with spouse. 12/19/16 aid every day for 4 hours   Caffeine Use: Very little; once a week     PHYSICAL EXAM  GENERAL EXAM/CONSTITUTIONAL: Vitals:  Vitals:   02/25/18 0805  BP: 132/64  Pulse: (!) 49  Weight: 81 lb (36.7 kg)  Height: 4\' 11"  (1.499 m)     Body mass index is 16.36 kg/m. Wt Readings from Last 3 Encounters:  02/25/18 81 lb (36.7 kg)  12/09/17 82 lb 3.2 oz (37.3 kg)  08/01/17 84 lb 1.3 oz (38.1 kg)     Patient is in no distress; well developed, nourished and groomed; neck is supple  CARDIOVASCULAR:  Examination of carotid arteries is normal; no carotid bruits  Regular rate and rhythm; SYS MURMUR  Examination of peripheral vascular system by observation and palpation is normal  EYES:  Ophthalmoscopic exam of optic discs and posterior segments is normal; no papilledema or hemorrhages  No exam data present  MUSCULOSKELETAL:  Gait, strength, tone, movements noted in Neurologic exam below  NEUROLOGIC: MENTAL STATUS:  MMSE - Mini Mental State Exam 02/22/2014  Orientation to time 4    Orientation to Place 4  Registration 3  Attention/ Calculation 2  Recall 0  Language- name 2 objects 2  Language- repeat 1  Language- follow 3 step command 3  Language- read & follow direction 1  Write a sentence 1  Copy design 0  Total score 21    awake, alert,  oriented to person  [facility] memory   DECR attention and concentration  DECR FLUENCY  fund of knowledge appropriate  FLAT AFFECT  CRANIAL NERVE:   2nd - no papilledema on fundoscopic exam  2nd, 3rd, 4th, 6th - pupils equal and reactive to light, visual fields full to confrontation, extraocular muscles intact, no nystagmus  5th - facial sensation symmetric  7th - facial strength symmetric  8th - hearing intact  9th - palate elevates symmetrically, uvula midline  11th - shoulder shrug symmetric  12th - tongue protrusion midline  HYPOMIMIA  MILD DYSARTHRIA  MOTOR:   THIN; MILD COGWHEELING  MOD-SEVERE BRADYKINESIA IN BUE AND BLE  DIFFUSE 4 STRENGTH  SENSORY:   normal and symmetric to light touch  COORDINATION:   finger-nose-finger, fine finger movements SLOW  REFLEXES:   deep tendon reflexes 2+ and symmetric  GAIT/STATION:   STOOPED POSTURE; SINGLE POINT CANE      DIAGNOSTIC DATA (LABS, IMAGING, TESTING) - I reviewed patient records, labs, notes, testing and imaging myself where available.  Lab Results  Component Value Date   WBC 4.6 04/08/2017   HGB 13.4 04/08/2017   HCT 41.1 04/08/2017   MCV 96.0 04/08/2017   PLT 195.0 04/08/2017      Component Value Date/Time   NA 143 08/01/2017 0853   NA 145 12/04/2016 0947   K 4.8 08/01/2017 0853   K 3.9 12/04/2016 0947   CL 105 08/01/2017 0853   CL 107 12/08/2012 0926   CO2 33 (H) 08/01/2017 0853   CO2 30 (H) 12/04/2016 0947   GLUCOSE 125 (H) 08/01/2017 0853   GLUCOSE 91 12/04/2016 0947   GLUCOSE 123 (H) 12/08/2012 0926   BUN 23 08/01/2017 0853   BUN 18.1 12/04/2016 0947   CREATININE 0.95 08/01/2017 0853   CREATININE 0.8  12/04/2016 0947   CALCIUM 9.7 08/01/2017 0853   CALCIUM 9.7 12/04/2016 0947   PROT 6.6 12/04/2016 0947   ALBUMIN 3.9 12/04/2016 0947   AST 17 12/04/2016 0947   ALT <6 12/04/2016 0947   ALKPHOS 65 12/04/2016 0947   BILITOT 0.89 12/04/2016 0947   GFRNONAA 53 (L) 11/08/2016 1505   GFRAA >60 11/08/2016 1505   Lab Results  Component Value Date   CHOL 187 04/08/2017   HDL 72.40 04/08/2017   LDLCALC 100 (H) 04/08/2017   LDLDIRECT 142.2 10/28/2008   TRIG 73.0 04/08/2017   CHOLHDL 3 04/08/2017   Lab Results  Component Value Date   HGBA1C 5.4 09/13/2014   Lab Results  Component Value Date   VITAMINB12 335 09/19/2015   Lab Results  Component Value Date   TSH 9.55 (H) 08/01/2017    09/09/12 MRI brain  - Mild perisylvian and moderate temporal atrophy. Moderate periventricular and subcortical chronic small vessel ischemic disease. No significant change from prior MRI on 05/08/11.  12/16/15 CT head / cervical [I reviewed images myself and agree with interpretation. -VRP]  1. Stable CT evaluation of the head. No new or acute intracranial abnormality. 2. Atrophy with chronic small vessel white matter ischemic disease. 3. Extra-axial left frontal calcification is unchanged, likely related to meningioma. 4. 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 5. Old right inferior orbital wall blowout fracture. 6. Degenerative changes in the cervical spine without acute fracture.    ASSESSMENT AND PLAN  77 y.o. female with history of hypothyroidism, hypertension, peripheral vascular disease her for evaluation and management of advanced Parkinson's disease.   Non-threatening visual hallucinations are stable.   More falls  and memory problems lately. Unfortunately she did not benefit from PT.   She needs help with bathing, walking, dressing. She can feed herself to some degree, but losing weight.    Dx:  No diagnosis found.    PLAN:  ADVANCED PARKINSON'S DISEASE  (worsening) - continue palliative care - continue stalevo; may reduce to twice a day due to hallucincations and lack of benefit - continue support via home health aid and family support - use rollator walker  DEMENTIA IN PARKINSON'S DISEASE (established problem, worsening) - continue palliative care  HALLUCINATIONS (established problem, stable) - did not tolerate seroquel in the past - could consider nuplazid in future if hallucinations become more bothersome  Meds ordered this encounter  Medications  . carbidopa-levodopa-entacapone (STALEVO) 25-100-200 MG tablet    Sig: Take 1 tablet by mouth 3 (three) times daily.    Dispense:  270 tablet    Refill:  4   Return in about 1 year (around 02/26/2019).     Penni Bombard, MD 11/27/9933, 7:01 AM Certified in Neurology, Neurophysiology and Neuroimaging  [facility] [address] [place],  [address] [phone]

## 2018-03-14 ENCOUNTER — Other Ambulatory Visit: Payer: Medicare Other | Admitting: *Deleted

## 2018-03-14 ENCOUNTER — Other Ambulatory Visit: Payer: Medicare Other | Admitting: Licensed Clinical Social Worker

## 2018-03-14 DIAGNOSIS — Z515 Encounter for palliative care: Secondary | ICD-10-CM

## 2018-03-16 NOTE — Progress Notes (Signed)
COMMUNITY PALLIATIVE CARE SW NOTE  PATIENT NAME: [name] DOB: 08-17-1940 MRN: [id number]  PRIMARY CARE PROVIDER: Janith Lima, MD  RESPONSIBLE PARTY:  Acct ID - Guarantor Home Phone Work Phone Relationship Acct Type  [id number] - Fuentes,[phone]  Self P/F     [place], [place], [place] [address]     PLAN OF CARE and INTERVENTIONS:             1. GOALS OF CARE/ ADVANCE CARE PLANNING:  Husband, [name], wishes for patient to remain at home and does not want her hospitalized if possible.  She is a full code. 2. SOCIAL/EMOTIONAL/SPIRITUAL ASSESSMENT/ INTERVENTIONS:  SW and Palliative Care RN, [name], met with patient and her husband.  Patient and her husband were still sleeping and husband said he forgot about the visit.  Patient was observed walking with her cane.  She denied pain.  She required some prompting when asked questions.  [name] continues repeating stories during the visit. 3. PATIENT/CAREGIVER EDUCATION/ COPING:  Continue providing education regarding the Palliative Care program.  Patient copes by answering questions. 4. PERSONAL EMERGENCY PLAN:  [name] contacts his daughter who is a Therapist, sports. 5. COMMUNITY RESOURCES COORDINATION/ HEALTH CARE NAVIGATION:  Patient receives person care from a CNA for four hours five days per week. 6. FINANCIAL/LEGAL CONCERNS/INTERVENTIONS:  None     SOCIAL HX:  Social History   Tobacco Use  . Smoking status: Never Smoker  . Smokeless tobacco: Never Used  Substance Use Topics  . Alcohol use: No    CODE STATUS:  Full Code ADVANCED DIRECTIVES:  Yes MOST FORM COMPLETE:  No HOSPICE EDUCATION PROVIDED:   [name] said her knew about Hospice. PPS:  Patient's intake is normal per her husband.  She will stand independently and was using a walker during this visit. Duration of visit and documentation:  60 minutes.      Kristin Corn Amal Renbarger, LCSW

## 2018-03-17 DIAGNOSIS — H401134 Primary open-angle glaucoma, bilateral, indeterminate stage: Secondary | ICD-10-CM | POA: Diagnosis not present

## 2018-03-17 DIAGNOSIS — H40053 Ocular hypertension, bilateral: Secondary | ICD-10-CM | POA: Diagnosis not present

## 2018-03-17 DIAGNOSIS — H16223 Keratoconjunctivitis sicca, not specified as Sjogren's, bilateral: Secondary | ICD-10-CM | POA: Diagnosis not present

## 2018-03-17 DIAGNOSIS — H04123 Dry eye syndrome of bilateral lacrimal glands: Secondary | ICD-10-CM | POA: Diagnosis not present

## 2018-03-19 NOTE — Progress Notes (Signed)
COMMUNITY PALLIATIVE CARE RN NOTE  PATIENT NAME: [name] DOB: [birth year] MRN: [id number]  PRIMARY CARE PROVIDER: Janith Lima, MD  RESPONSIBLE PARTY:  Acct ID - Guarantor Home Phone Work Phone Relationship Acct Type  [id number] - Serna,[id number]  Self P/F     [place], [place], [place] [address]    PLAN OF CARE and INTERVENTION:  1. ADVANCE CARE PLANNING/GOALS OF CARE: Husband would like her to remain at home and avoid hospitalizations 2. PATIENT/CAREGIVER EDUCATION: Reinforced Safe Mobility/Transfers 3. DISEASE STATUS: Joint visit made with [facility]. Upon arrival, patient was still in bed. Observed patient ambulating with her cane into the living room. Requires 1 person assistance while walking for stability. She is able to stand independently. She is able to answer simple questions, but at times is slow to process and respond. Denies pain. Reports feeling ok today. Husband continues to take her to bible study on Wednesdays. Attends church on some Sundays, but does not go there as often as they used to. She is eating 3 meals/day of small portions. No dysphagia reported. Takes medications whole with Ensure. Drinks one Ensure per day. Continues to requires 1 person assistance with bathing, dressing and toileting. Intermittent incontinence of bowel and bladder. Wears Depends. Will continue to monitor.  HISTORY OF PRESENT ILLNESS:  This is a 77 yo female who resides at home with her husband. Continues with hired caregivers 5 days/week for about 4 hours/day. Palliative Care Team continues to follow.  CODE STATUS: FULL CODE  ADVANCED DIRECTIVES: Y MOST FORM: no PPS: 40%   PHYSICAL EXAM:   VITALS: Today's Vitals   03/14/18 1012  BP: 126/62  Pulse: 64  Resp: 14  SpO2: 100%  PainSc: 0-No pain    LUNGS: clear to auscultation  CARDIAC: Cor RRR EXTREMITIES: No edema SKIN: Thin/frail, exposed skin is dry and intact  NEURO: Alert and oriented to  person/place, intermittent confusion, generalized weakness, ambulatory with cane and 1 person assistance   (Duration of visit and documentation 60 minutes)    Daryl Eastern, RN, BSN

## 2018-03-21 ENCOUNTER — Encounter: Payer: Self-pay | Admitting: Hematology and Oncology

## 2018-03-21 DIAGNOSIS — Z1231 Encounter for screening mammogram for malignant neoplasm of breast: Secondary | ICD-10-CM | POA: Diagnosis not present

## 2018-03-21 DIAGNOSIS — Z853 Personal history of malignant neoplasm of breast: Secondary | ICD-10-CM | POA: Diagnosis not present

## 2018-03-21 LAB — HM MAMMOGRAPHY

## 2018-03-28 ENCOUNTER — Other Ambulatory Visit: Payer: Self-pay | Admitting: Internal Medicine

## 2018-03-28 DIAGNOSIS — E039 Hypothyroidism, unspecified: Secondary | ICD-10-CM

## 2018-04-01 ENCOUNTER — Telehealth: Payer: Self-pay | Admitting: *Deleted

## 2018-04-01 NOTE — Telephone Encounter (Signed)
Pt [employer] form on [employer].

## 2018-04-02 ENCOUNTER — Encounter: Payer: Self-pay | Admitting: Internal Medicine

## 2018-04-03 DIAGNOSIS — H401134 Primary open-angle glaucoma, bilateral, indeterminate stage: Secondary | ICD-10-CM | POA: Diagnosis not present

## 2018-04-03 DIAGNOSIS — H40053 Ocular hypertension, bilateral: Secondary | ICD-10-CM | POA: Diagnosis not present

## 2018-04-03 DIAGNOSIS — Z0289 Encounter for other administrative examinations: Secondary | ICD-10-CM

## 2018-04-03 DIAGNOSIS — H04123 Dry eye syndrome of bilateral lacrimal glands: Secondary | ICD-10-CM | POA: Diagnosis not present

## 2018-04-03 DIAGNOSIS — H16223 Keratoconjunctivitis sicca, not specified as Sjogren's, bilateral: Secondary | ICD-10-CM | POA: Diagnosis not present

## 2018-04-09 NOTE — Telephone Encounter (Signed)
Received two forms from [employer] for family member. Papers completed, on Dr [employer] desk for review, signature.

## 2018-04-10 NOTE — Telephone Encounter (Signed)
[employer] LOA for family members completed, signed, sent to MR for processing.

## 2018-04-11 ENCOUNTER — Other Ambulatory Visit: Payer: Medicare Other | Admitting: Licensed Clinical Social Worker

## 2018-04-11 ENCOUNTER — Telehealth: Payer: Self-pay | Admitting: *Deleted

## 2018-04-11 DIAGNOSIS — Z515 Encounter for palliative care: Secondary | ICD-10-CM

## 2018-04-11 NOTE — Telephone Encounter (Signed)
I faxed pt FMLA to [facility]  on 1018/19

## 2018-04-13 NOTE — Progress Notes (Signed)
COMMUNITY PALLIATIVE CARE SW NOTE  PATIENT NAME: [name] DOB: 15-Jul-1940 MRN: [id number]  PRIMARY CARE PROVIDER: Janith Lima, MD  RESPONSIBLE PARTY:  Acct ID - Guarantor Home Phone Work Phone Relationship Acct Type  [id number] - [name],[id number]  Self P/F     [place], [place], [place] [address]     PLAN OF CARE and INTERVENTIONS:             1. GOALS OF CARE/ ADVANCE CARE PLANNING:  Patient's husband wishes for patient to remain in their home and not to be hospitalized.  Patient is a full code. 2. SOCIAL/EMOTIONAL/SPIRITUAL ASSESSMENT/ INTERVENTIONS:  SW met with patient and her husband, [name], in their home.  Patient had difficulty following direction from her husband to sit in a chair.  She denied pain and answered questions with 2-3 words.  She did not initiate conversation though.  [name] stated he remembered SW, but asked from what agency.  He told the same stories as he did on previous visits. 3. PATIENT/CAREGIVER EDUCATION/ COPING:  Patient copes by answering questions.  She does not initiate conversation. 4. PERSONAL EMERGENCY PLAN:  Husband will contact his daughter who is an Therapist, sports. 5. COMMUNITY RESOURCES COORDINATION/ HEALTH CARE NAVIGATION:  Patient has a CNA for four hours per day five days per week. 6. FINANCIAL/LEGAL CONCERNS/INTERVENTIONS:  None.     SOCIAL HX:  Social History   Tobacco Use  . Smoking status: Never Smoker  . Smokeless tobacco: Never Used  Substance Use Topics  . Alcohol use: No    CODE STATUS:  Full Code ADVANCED DIRECTIVES: Yes MOST FORM COMPLETE: No HOSPICE EDUCATION PROVIDED: Husband is familiar with Hospice. PPS:  Patients appetite is normal per husband.  She stands independently and uses a walker. Duration of visit and documentation:  60 minutes.      Kristin Corn Luz Burcher, LCSW

## 2018-04-21 ENCOUNTER — Telehealth: Payer: Self-pay | Admitting: *Deleted

## 2018-04-21 NOTE — Telephone Encounter (Signed)
Pt daughter called need question # 7 change on fmla form. Please call [phone]

## 2018-04-22 NOTE — Telephone Encounter (Signed)
Done. -VRP

## 2018-04-22 NOTE — Telephone Encounter (Signed)
Spoke to daughter.  Needed the top portion of # 7 filled out (as marked yes as episodic flare ups) from previous form.  Please sign and date addended part. Thanks

## 2018-04-24 DIAGNOSIS — H401114 Primary open-angle glaucoma, right eye, indeterminate stage: Secondary | ICD-10-CM | POA: Diagnosis not present

## 2018-05-02 DIAGNOSIS — H16223 Keratoconjunctivitis sicca, not specified as Sjogren's, bilateral: Secondary | ICD-10-CM | POA: Diagnosis not present

## 2018-05-02 DIAGNOSIS — H401134 Primary open-angle glaucoma, bilateral, indeterminate stage: Secondary | ICD-10-CM | POA: Diagnosis not present

## 2018-05-02 DIAGNOSIS — H04123 Dry eye syndrome of bilateral lacrimal glands: Secondary | ICD-10-CM | POA: Diagnosis not present

## 2018-05-02 DIAGNOSIS — H02833 Dermatochalasis of right eye, unspecified eyelid: Secondary | ICD-10-CM | POA: Diagnosis not present

## 2018-05-15 ENCOUNTER — Telehealth: Payer: Self-pay | Admitting: Internal Medicine

## 2018-05-15 NOTE — Telephone Encounter (Signed)
Patient's daughter has dropped off FMLA renewal forms for [name] (patient's son) to be completed.   LOV: 07/2017 - Patient was due for a FU in Aug - I had them make an appointment for 12/5.   Are you okay with forms being completed? Thank you.

## 2018-05-15 NOTE — Telephone Encounter (Signed)
Yes, complete them

## 2018-05-15 NOTE — Telephone Encounter (Signed)
Forms have been completed & placed in providers box to review and sign.  °

## 2018-05-16 ENCOUNTER — Other Ambulatory Visit: Payer: Medicare Other | Admitting: Licensed Clinical Social Worker

## 2018-05-16 ENCOUNTER — Other Ambulatory Visit: Payer: Medicare Other | Admitting: *Deleted

## 2018-05-16 DIAGNOSIS — Z515 Encounter for palliative care: Secondary | ICD-10-CM

## 2018-05-16 NOTE — Progress Notes (Signed)
COMMUNITY PALLIATIVE CARE SW NOTE  PATIENT NAME: [name] DOB: 05/16/1941 MRN: [id number]  PRIMARY CARE PROVIDER: Janith Lima, MD  RESPONSIBLE PARTY:  Acct ID - Guarantor Home Phone Work Phone Relationship Acct Type  [id number] - [name],[phone]  Self P/F     [place], [place], [place] [address]     PLAN OF CARE and INTERVENTIONS:             1. GOALS OF CARE/ ADVANCE CARE PLANNING:  Husband, [name], wishes for patient to remain at home.  She is a full code. 2. SOCIAL/EMOTIONAL/SPIRITUAL ASSESSMENT/ INTERVENTIONS:  SW and Palliative Care RN, [name], met with patient and her husband.  She denied pain.  Patient sat in a chair and answered questions when asked directly.  It was difficult to understand her at times because her voice was soft.  [name] repeated information from previous visits about how he met patient and their lives together.  He became tearful when talking about his deceased brother and mother, which is new.  SW provided active listening and supportive counseling. 3. PATIENT/CAREGIVER EDUCATION/ COPING:  [name] copes by expressing himself openly and reminiscing. 4. PERSONAL EMERGENCY PLAN:  [name] will call his daughter, [name], for medical issues.  She is an Therapist, sports. 5. 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 6. FINANCIAL/LEGAL CONCERNS/INTERVENTIONS:  None.     SOCIAL HX:  Social History   Tobacco Use  . Smoking status: Never Smoker  . Smokeless tobacco: Never Used  Substance Use Topics  . Alcohol use: No    CODE STATUS:  Full Code ADVANCED DIRECTIVES: Yes MOST FORM COMPLETE:  No HOSPICE EDUCATION PROVIDED: Husband is familiar with Hospice. PPS:  Patient's appetite is normal per her husband.  SW observed patient ambulate independently. Duration of visit and documentation:  60 minutes.      Kristin Corn Essam Lowdermilk, LCSW

## 2018-05-19 NOTE — Telephone Encounter (Signed)
Signed, Copy sent to scan & charged for.   Spoke with daughter and informed her the forms are ready to be picked up.

## 2018-05-19 NOTE — Telephone Encounter (Signed)
The charge is under [employer].

## 2018-05-21 NOTE — Progress Notes (Signed)
COMMUNITY PALLIATIVE CARE RN NOTE  PATIENT NAME: [name] DOB: 1940-08-09 MRN: [id number]  PRIMARY CARE PROVIDER: Janith Lima, MD  RESPONSIBLE PARTY:  Acct ID - Guarantor Home Phone Work Phone Relationship Acct Type  [id number] - Geise,[phone]  Self P/F     [place], [place], [place] [address]    PLAN OF CARE and INTERVENTION:  1. ADVANCE CARE PLANNING/GOALS OF CARE: Remain at home with her husband and avoid going to the hospital 2. PATIENT/CAREGIVER EDUCATION: Reinforced Safe Mobility/Transfers 3. DISEASE STATUS: Joint visit made with Palliative Care SW, Lynn Duffy. Met with patient and her husband in their home. She is awake and alert, sitting up in a chair in the living room. She is able to answer simple questions with short replies. She denies pain. She is able to stand and ambulate independently, but gait is unsteady at times. She will use a cane at times during ambulation or hold onto furniture, walls, etc. They continue to attend bible study and church services, but not as regularly. Her intake is normal per husband, but portion sizes are small. No dysphagia reported and she takes her medications whole with thin fluids. Requires assistance with bathing and dressing. Intermittent urinary incontinence. They continue with a hired caregiver 5 days/week for 4 hours/day to assist with personal care and household chores. He has no complaints or concerns at this time. Will continue to monitor.  HISTORY OF PRESENT ILLNESS:  This is a 77 yo female who resides at home with her husband. Palliative Care Team continues to follow patient. Will continue to visit monthly and PRN.  CODE STATUS: FULL CODE ADVANCED DIRECTIVES: Yes MOST FORM: no PPS: 40%   PHYSICAL EXAM:   VITALS: Today's Vitals   05/16/18 1127  BP: 106/62  Pulse: (!) 55  Resp: 16  Temp: (!) 97.3 F (36.3 C)  TempSrc: Temporal  SpO2: 100%  PainSc: 0-No pain    LUNGS: clear to auscultation  CARDIAC:  Cor RRR EXTREMITIES: No edema SKIN: Exposed skin is dry and intact, no skin breakdown/wounds reported  NEURO: Alert and oriented x 2, intermittent confusion, pleasant mood, ambulatory   (Duration of visit and documentation 60 minutes)    Daryl Eastern, RN, BSN

## 2018-05-27 DIAGNOSIS — H04123 Dry eye syndrome of bilateral lacrimal glands: Secondary | ICD-10-CM | POA: Diagnosis not present

## 2018-05-27 DIAGNOSIS — H16223 Keratoconjunctivitis sicca, not specified as Sjogren's, bilateral: Secondary | ICD-10-CM | POA: Diagnosis not present

## 2018-05-27 DIAGNOSIS — H401134 Primary open-angle glaucoma, bilateral, indeterminate stage: Secondary | ICD-10-CM | POA: Diagnosis not present

## 2018-05-27 DIAGNOSIS — H40053 Ocular hypertension, bilateral: Secondary | ICD-10-CM | POA: Diagnosis not present

## 2018-05-29 ENCOUNTER — Encounter: Payer: Self-pay | Admitting: Internal Medicine

## 2018-05-29 ENCOUNTER — Ambulatory Visit (INDEPENDENT_AMBULATORY_CARE_PROVIDER_SITE_OTHER): Payer: Medicare Other | Admitting: Internal Medicine

## 2018-05-29 ENCOUNTER — Other Ambulatory Visit (INDEPENDENT_AMBULATORY_CARE_PROVIDER_SITE_OTHER): Payer: Medicare Other

## 2018-05-29 ENCOUNTER — Ambulatory Visit
Admission: RE | Admit: 2018-05-29 | Discharge: 2018-05-29 | Disposition: A | Discharge status: Home / Self Care | Payer: Medicare Other | Source: Ambulatory Visit | Attending: Internal Medicine | Admitting: Internal Medicine

## 2018-05-29 VITALS — BP 124/60 | HR 60 | Temp 97.7°F | Resp 20 | Ht 59.0 in | Wt 71.2 lb

## 2018-05-29 DIAGNOSIS — I10 Essential (primary) hypertension: Secondary | ICD-10-CM | POA: Diagnosis not present

## 2018-05-29 DIAGNOSIS — R739 Hyperglycemia, unspecified: Secondary | ICD-10-CM

## 2018-05-29 DIAGNOSIS — E039 Hypothyroidism, unspecified: Secondary | ICD-10-CM

## 2018-05-29 DIAGNOSIS — R296 Repeated falls: Secondary | ICD-10-CM | POA: Diagnosis not present

## 2018-05-29 DIAGNOSIS — S0990XA Unspecified injury of head, initial encounter: Secondary | ICD-10-CM

## 2018-05-29 LAB — CBC WITH DIFFERENTIAL/PLATELET
BASOS ABS: 0.1 10*3/uL (ref 0.0–0.1)
Basophils Relative: 1.4 % (ref 0.0–3.0)
Eosinophils Absolute: 0 10*3/uL (ref 0.0–0.7)
Eosinophils Relative: 0.4 % (ref 0.0–5.0)
HCT: 43.1 % (ref 36.0–46.0)
Hemoglobin: 14.3 g/dL (ref 12.0–15.0)
Lymphocytes Relative: 35.6 % (ref 12.0–46.0)
Lymphs Abs: 1.3 10*3/uL (ref 0.7–4.0)
MCHC: 33.3 g/dL (ref 30.0–36.0)
MCV: 94.1 fl (ref 78.0–100.0)
MONOS PCT: 5.5 % (ref 3.0–12.0)
Monocytes Absolute: 0.2 10*3/uL (ref 0.1–1.0)
Neutro Abs: 2 10*3/uL (ref 1.4–7.7)
Neutrophils Relative %: 57.1 % (ref 43.0–77.0)
Platelets: 201 10*3/uL (ref 150.0–400.0)
RBC: 4.58 Mil/uL (ref 3.87–5.11)
RDW: 13.3 % (ref 11.5–15.5)
WBC: 3.6 10*3/uL — ABNORMAL LOW (ref 4.0–10.5)

## 2018-05-29 LAB — TSH: TSH: 8.13 u[IU]/mL — ABNORMAL HIGH (ref 0.35–4.50)

## 2018-05-29 LAB — BASIC METABOLIC PANEL
BUN: 19 mg/dL (ref 6–23)
CALCIUM: 9.9 mg/dL (ref 8.4–10.5)
CO2: 28 mEq/L (ref 19–32)
Chloride: 108 mEq/L (ref 96–112)
Creatinine, Ser: 0.82 mg/dL (ref 0.40–1.20)
GFR: 86.35 mL/min (ref >60.00)
Glucose, Bld: 75 mg/dL (ref 70–99)
Potassium: 5.5 mEq/L — ABNORMAL HIGH (ref 3.5–5.1)
SODIUM: 147 meq/L — AB (ref 135–145)

## 2018-05-29 LAB — HEMOGLOBIN A1C: Hgb A1c MFr Bld: 5.4 % (ref 4.6–6.5)

## 2018-05-29 NOTE — Patient Instructions (Signed)
Head Injury, Adult  There are many types of head injuries. Head injuries can be as minor as a bump, or they can be more severe. More severe head injuries include:   A jarring injury to the brain (concussion).   A bruise of the brain (contusion). This means there is bleeding in the brain that can cause swelling.   A cracked skull (skull fracture).   Bleeding in the brain that collects, clots, and forms a bump (hematoma).    After a head injury, you may need to be observed for a while in the emergency department or urgent care. Sometimes admission to the hospital is needed.  After a head injury has happened, most problems occur within the first 24 hours, but side effects may occur up to 7-10 days after the injury. It is important to watch your condition for any changes.  What are the causes?  There are many possible causes of a head injury. A serious head injury may happen to someone who is in a car accident (motor vehicle collision). Other causes of major head injuries include bicycle or motorcycle accidents, sports injuries, and falls.  Risk factors  This condition is more likely to occur in people who:   Drink a lot of alcohol or use drugs.   Are over the age of 65.   Are at risk for falls.    What are the symptoms?  There are many possible symptoms of a head injury. Visible symptoms of a head injury include a bruise, bump, or bleeding at the site of the injury. Other non-visible symptoms include:   Feeling sleepy or not being able to stay awake.   Passing out.   Headache.   Seizures.   Dizziness.   Confusion.   Memory problems.   Nausea or vomiting.    Other possible symptoms that may develop after the head injury include:   Poor attention and concentration.   Fatigue or tiring easily.   Irritability.   Being uncomfortable around bright lights or loud noises.   Anxiety or depression.   Disturbed sleep.    How is this diagnosed?  This condition can usually be diagnosed based on your symptoms, a  description of the injury, and a physical exam. You may also have imaging tests done, such as a CT scan or MRI. You will also be closely watched.  How is this treated?  Treatment for this condition depends on the severity and type of injury you have. The main goal of treatment is to prevent complications and allow the brain time to heal.  For mild head injury, you may be sent home and treatment may include:   Observation. A responsible adult should stay with you for 24 hours after your injury and check on you often.   Physical rest.   Brain rest.   Pain medicines.    For severe brain injury, treatment may include:   Close observation. This includes hospitalization with frequent physical exams. You may need to go to a hospital that specializes in head injury.   Pain medicines.   Breathing support. This may include using a ventilator.   Managing the pressure inside the brain (intracranial pressure, or ICP). This may include:  ? Monitoring the ICP.  ? Giving medicines to decrease the ICP.  ? Positioning you to decrease the ICP.   Medicine to prevent seizures.   Surgery to stop bleeding or to remove blood clots (craniotomy).   Surgery to remove part of the skull (decompressive   craniectomy). This allows room for the brain to swell.    Follow these instructions at home:  Activity   Rest as much as possible and avoid activities that are physically hard or tiring.   Make sure you get enough sleep.   Limit activities that require a lot of thought or attention, such as:  ? Watching TV.  ? Playing memory games and puzzles.  ? Job-related work or homework.  ? Working on the computer, social media, and texting.   Avoid activities that could cause another head injury, such as playing sports, until your health care provider approves. Having another head injury, especially before the first one has healed, can be dangerous.   Ask your health care provider when it is safe for you to return to your regular activities,  including work or school. Ask your health care provider for a step-by-step plan for gradually returning to activities.   Ask your health care provider when you can drive, ride a bicycle, or use heavy machinery. Your ability to react may be slower after a brain injury. Never do these activities if you are dizzy.    Lifestyle   Do not drink alcohol until your health care provider approves, and avoid drug use. Alcohol and certain drugs may slow your recovery and can put you at risk of further injury.   If it is harder than usual to remember things, write them down.   If you are easily distracted, try to do one thing at a time.   Talk with family members or close friends when making important decisions.   Tell your friends, family, a trusted colleague, and work manager about your injury, symptoms, and restrictions. Have them watch for any new or worsening problems.    General instructions   Take over-the-counter and prescription medicines only as told by your health care provider.   Have someone stay with you for 24 hours after your head injury. This person should watch you for any changes in your symptoms and be ready to seek medical help, as needed.   Keep all follow-up visits as told by your health care provider. This is important.    Prevention   Work on improving your balance and strength to avoid falls.   Wear a seatbelt when you are in a moving vehicle.   Wear a helmet when riding a bicycle, skiing, or doing any other sport or activity that has a risk of injury.   Drink alcohol only in moderation.   Take safety measures in your home, such as:  ? Removing clutter and tripping hazards from floors and stairways.  ? Using grab bars in bathrooms and handrails by stairs.  ? Placing non-slip mats on floors and in bathtubs.  ? Improving lighting in dim areas.  Get help right away if:   You have:  ? A severe headache that is not helped by medicine.  ? Trouble walking, have weakness in your arms and legs, or  lose your balance.  ? Clear or bloody fluid coming from your nose or ears.  ? Changes in your vision.  ? A seizure.   You vomit.   Your symptoms get worse.   Your speech is slurred.   You pass out.   You are sleepier and have trouble staying awake.   Your pupils change size.  These symptoms may represent a serious problem that is an emergency. Do not wait to see if the symptoms will go away. Get medical help right away.

## 2018-05-29 NOTE — Progress Notes (Signed)
Subjective:  Patient ID: [name], female    DOB: April 23, 1941  Age: 77 y.o. MRN: [id number]  CC: Hypertension; Hypothyroidism; and Head Injury   HPI Mosella Kasa presents for f/up - The history is obtained from a daughter who is with the patient today.  The patient is noncommunicative.  She has end-stage dementia and Parkinson's disease.  Apparently she fell at home and hit her head about 4 days ago.  She has had a slight decline in her sensorium since then.  She has a bruise and a scab over her right eyebrow.  Injury was not witnessed by anyone who could discern whether or not there was loss of consciousness.  There have been no reports of nausea or vomiting.  Outpatient Medications Prior to Visit  Medication Sig Dispense Refill  . Bromfenac Sodium 0.09 % SOLN     . carbidopa-levodopa-entacapone (STALEVO) 25-100-200 MG tablet Take 1 tablet by mouth 3 (three) times daily. 270 tablet 4  . Cholecalciferol ([employer] D3) 2000 units CAPS Take 1 capsule (2,000 Units total) by mouth daily. 90 capsule 3  . cycloSPORINE (RESTASIS) 0.05 % ophthalmic emulsion     . dorzolamide (TRUSOPT) 2 % ophthalmic solution INSTILL 1 DROP INTO LEFT EYE TWICE A DAY  3  . latanoprost (XALATAN) 0.005 % ophthalmic solution one drop in each eye at bedtime    . levothyroxine (SYNTHROID, LEVOTHROID) 50 MCG tablet Take 1 tablet (50 mcg total) by mouth daily before breakfast. 90 tablet 1  . moxifloxacin (VIGAMOX) 0.5 % ophthalmic solution     . SIMBRINZA 1-0.2 % SUSP INSTILL 1 DROP INTO BOTH EYES THREE TIMES A DAY  4  . levothyroxine (SYNTHROID, LEVOTHROID) 50 MCG tablet Take 1 tablet (50 mcg total) by mouth daily. 90 tablet 1   No facility-administered medications prior to visit.     ROS Review of Systems  Reason unable to perform ROS: dementia.    Objective:  BP 124/60 (BP Location: Left Arm, Patient Position: Sitting, Cuff Size: Small)   Pulse 60   Temp 97.7 F (36.5 C) (Oral)   Resp 20   Ht 4\' 11"  (1.499 m)    Wt 71 lb 4 oz (32.3 kg)   BMI 14.39 kg/m   BP Readings from Last 3 Encounters:  05/29/18 124/60  05/16/18 106/62  03/14/18 126/62    Wt Readings from Last 3 Encounters:  05/29/18 71 lb 4 oz (32.3 kg)  02/25/18 81 lb (36.7 kg)  12/09/17 82 lb 3.2 oz (37.3 kg)    Physical Exam  Constitutional: No distress.  HENT:  Head: Head is with abrasion and with contusion. Head is without raccoon's eyes and without Battle's sign.    Mouth/Throat: No oropharyngeal exudate.  Eyes: Pupils are equal, round, and reactive to light. Conjunctivae and EOM are normal.  Neck: Normal range of motion. Neck supple. No JVD present. No thyromegaly present.  Cardiovascular: Normal rate, regular rhythm and normal heart sounds.  Pulmonary/Chest: Effort normal and breath sounds normal. She has no wheezes. She has no rales.  Abdominal: Soft. Bowel sounds are normal. There is no hepatosplenomegaly. There is no tenderness.  Neurological: She is alert. She displays atrophy. She displays no tremor. A sensory deficit is present. She exhibits abnormal muscle tone. She displays no seizure activity. Coordination and gait abnormal.  She is noncommunicative, wheelchair-bound, and flaccid in both upper and lower extremities  Skin: She is not diaphoretic.    Lab Results  Component Value Date   WBC 4.6  04/08/2017   HGB 13.4 04/08/2017   HCT 41.1 04/08/2017   PLT 195.0 04/08/2017   GLUCOSE 125 (H) 08/01/2017   CHOL 187 04/08/2017   TRIG 73.0 04/08/2017   HDL 72.40 04/08/2017   LDLDIRECT 142.2 10/28/2008   LDLCALC 100 (H) 04/08/2017   ALT <6 12/04/2016   AST 17 12/04/2016   NA 143 08/01/2017   K 4.8 08/01/2017   CL 105 08/01/2017   CREATININE 0.95 08/01/2017   BUN 23 08/01/2017   CO2 33 (H) 08/01/2017   TSH 9.55 (H) 08/01/2017   INR 1.18 09/19/2015   HGBA1C 5.4 09/13/2014   MICROALBUR <0.7 09/13/2014    Ct Head Wo Contrast  Result Date: 11/08/2016 CLINICAL DATA:  Pain after fall. EXAM: CT HEAD WITHOUT  CONTRAST CT CERVICAL SPINE WITHOUT CONTRAST TECHNIQUE: Multidetector CT imaging of the head and cervical spine was performed following the standard protocol without intravenous contrast. Multiplanar CT image reconstructions of the cervical spine were also generated. COMPARISON:  December 16, 2015 and July 23, 2016 FINDINGS: CT HEAD FINDINGS Brain: No subdural, epidural, or subarachnoid hemorrhage. 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 These findings are unchanged given difference in slice selection. Ventricles and sulci are prominent but stable. Moderate to severe white matter changes remain. No acute cortical ischemia or infarct is identified. The cerebellum, brainstem, and basal cisterns are normal. No mass, mass effect, or midline shift. Vascular: Calcified atherosclerosis is seen in the intracranial carotid arteries. Skull: Normal. Negative for fracture or focal lesion. Sinuses/Orbits: No acute finding. Other: There is a hematoma over the posterior left scalp. CT CERVICAL SPINE FINDINGS Alignment: Minimal anterolisthesis of C3 versus C4 is stable since June of 2017. No other malalignment. Skull base and vertebrae: No acute fracture. No primary bone lesion or focal pathologic process. Soft tissues and spinal canal: No prevertebral fluid or swelling. No visible canal hematoma. Disc levels: Degenerative disc disease and facet degenerative changes are stable. Upper chest: Negative. Other: No other abnormalities identified. IMPRESSION: 1. No acute intracranial abnormality. 2. No fracture or traumatic malalignment in the cervical spine. Degenerative changes. Electronically Signed   By: Dorise Bullion III M.D   On: 11/08/2016 17:14   Ct Cervical Spine Wo Contrast  Result Date: 11/08/2016 CLINICAL DATA:  Pain after fall. EXAM: CT HEAD WITHOUT CONTRAST CT CERVICAL SPINE WITHOUT CONTRAST TECHNIQUE: Multidetector CT imaging of the head  and cervical spine was performed following the standard protocol without intravenous contrast. Multiplanar CT image reconstructions of the cervical spine were also generated. COMPARISON:  December 16, 2015 and July 23, 2016 FINDINGS: CT HEAD FINDINGS Brain: No subdural, epidural, or subarachnoid hemorrhage. 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 These findings are unchanged given difference in slice selection. Ventricles and sulci are prominent but stable. Moderate to severe white matter changes remain. No acute cortical ischemia or infarct is identified. The cerebellum, brainstem, and basal cisterns are normal. No mass, mass effect, or midline shift. Vascular: Calcified atherosclerosis is seen in the intracranial carotid arteries. Skull: Normal. Negative for fracture or focal lesion. Sinuses/Orbits: No acute finding. Other: There is a hematoma over the posterior left scalp. CT CERVICAL SPINE FINDINGS Alignment: Minimal anterolisthesis of C3 versus C4 is stable since June of 2017. No other malalignment. Skull base and vertebrae: No acute fracture. No primary bone lesion or focal  pathologic process. Soft tissues and spinal canal: No prevertebral fluid or swelling. No visible canal hematoma. Disc levels: Degenerative disc disease and facet degenerative changes are stable. Upper chest: Negative. Other: No other abnormalities identified. IMPRESSION: 1. No acute intracranial abnormality. 2. No fracture or traumatic malalignment in the cervical spine. Degenerative changes. Electronically Signed   By: Dorise Bullion III M.D   On: 11/08/2016 17:14    Assessment & Plan:   Decie was seen today for hypertension, hypothyroidism and head injury.  Diagnoses and all orders for this visit:  Acquired hypothyroidism-we will check her TSH and will adjust her dose if indicated. -     TSH; Future  Essential hypertension-blood pressure is  well controlled.  I will monitor electrolytes and renal function. -     CBC with Differential/Platelet; Future -     Basic metabolic panel; Future  Hyperglycemia-she will undergo an A1c to see if she has developed DM 2. -     Basic metabolic panel; Future -     Hemoglobin A1c; Future  Traumatic injury of head, initial encounter- I have ordered a CT scan of her head to see if there is a subdural hematoma, skull fracture, or intracranial hemorrhage. -     CT Head Wo Contrast; Future   I am having Osa Craver maintain her levothyroxine, Cholecalciferol, carbidopa-levodopa-entacapone, moxifloxacin, Bromfenac Sodium, SIMBRINZA, cycloSPORINE, dorzolamide, and latanoprost.  No orders of the defined types were placed in this encounter.    Follow-up: No follow-ups on file.  Scarlette Calico, MD

## 2018-06-04 ENCOUNTER — Telehealth: Payer: Self-pay

## 2018-06-04 NOTE — Telephone Encounter (Signed)
Lab Results  Component Value Date   WBC 3.6 (L) 05/29/2018   HGB 14.3 05/29/2018   HCT 43.1 05/29/2018   PLT 201.0 05/29/2018   GLUCOSE 75 05/29/2018   CHOL 187 04/08/2017   TRIG 73.0 04/08/2017   HDL 72.40 04/08/2017   LDLDIRECT 142.2 10/28/2008   LDLCALC 100 (H) 04/08/2017   ALT <6 12/04/2016   AST 17 12/04/2016   NA 147 (H) 05/29/2018   K 5.5 (H) 05/29/2018   CL 108 05/29/2018   CREATININE 0.82 05/29/2018   BUN 19 05/29/2018   CO2 28 05/29/2018   TSH 8.13 (H) 05/29/2018   INR 1.18 09/19/2015   HGBA1C 5.4 05/29/2018   MICROALBUR <0.7 09/13/2014    Her potassium and sodium levels were high. This is was most likely a hemolyzed specimen. Her thyroid level was slightly abnormal but does not need to be treated. The other labs were all okay.

## 2018-06-04 NOTE — Telephone Encounter (Signed)
Copied from [place] [id number]. Topic: Quick Communication - Lab Results (Clinic Use ONLY) >> Jun 04, 2018 12:31 PM Lennox Solders wrote: Pt daughter cheryl who is not on DPR but is her mom POA would like blood work results. Per cheryl ok to leave detail message on her voicemail

## 2018-06-05 NOTE — Telephone Encounter (Signed)
Spoke to [place] and gave lab results.

## 2018-06-17 ENCOUNTER — Other Ambulatory Visit: Payer: Federal, State, Local not specified - PPO | Admitting: Licensed Clinical Social Worker

## 2018-06-17 DIAGNOSIS — Z515 Encounter for palliative care: Secondary | ICD-10-CM

## 2018-06-20 ENCOUNTER — Emergency Department (HOSPITAL_COMMUNITY): Payer: Medicare Other

## 2018-06-20 ENCOUNTER — Other Ambulatory Visit: Payer: Self-pay

## 2018-06-20 ENCOUNTER — Encounter (HOSPITAL_COMMUNITY): Payer: Self-pay | Admitting: *Deleted

## 2018-06-20 ENCOUNTER — Inpatient Hospital Stay (HOSPITAL_COMMUNITY)
Admission: EM | Admit: 2018-06-20 | Discharge: 2018-06-25 | DRG: 640 | Disposition: A | Discharge status: Hospice | Payer: Medicare Other | Attending: Internal Medicine | Admitting: Internal Medicine

## 2018-06-20 DIAGNOSIS — E039 Hypothyroidism, unspecified: Secondary | ICD-10-CM | POA: Diagnosis present

## 2018-06-20 DIAGNOSIS — R4182 Altered mental status, unspecified: Secondary | ICD-10-CM | POA: Diagnosis not present

## 2018-06-20 DIAGNOSIS — R402212 Coma scale, best verbal response, none, at arrival to emergency department: Secondary | ICD-10-CM | POA: Diagnosis present

## 2018-06-20 DIAGNOSIS — R627 Adult failure to thrive: Secondary | ICD-10-CM | POA: Diagnosis present

## 2018-06-20 DIAGNOSIS — Z853 Personal history of malignant neoplasm of breast: Secondary | ICD-10-CM | POA: Diagnosis not present

## 2018-06-20 DIAGNOSIS — K5730 Diverticulosis of large intestine without perforation or abscess without bleeding: Secondary | ICD-10-CM | POA: Diagnosis present

## 2018-06-20 DIAGNOSIS — F0280 Dementia in other diseases classified elsewhere without behavioral disturbance: Secondary | ICD-10-CM | POA: Diagnosis present

## 2018-06-20 DIAGNOSIS — H409 Unspecified glaucoma: Secondary | ICD-10-CM | POA: Diagnosis present

## 2018-06-20 DIAGNOSIS — R32 Unspecified urinary incontinence: Secondary | ICD-10-CM | POA: Diagnosis present

## 2018-06-20 DIAGNOSIS — G20 Parkinson's disease: Secondary | ICD-10-CM | POA: Diagnosis present

## 2018-06-20 DIAGNOSIS — E43 Unspecified severe protein-calorie malnutrition: Secondary | ICD-10-CM | POA: Diagnosis present

## 2018-06-20 DIAGNOSIS — E7800 Pure hypercholesterolemia, unspecified: Secondary | ICD-10-CM | POA: Diagnosis present

## 2018-06-20 DIAGNOSIS — Z681 Body mass index (BMI) 19 or less, adult: Secondary | ICD-10-CM

## 2018-06-20 DIAGNOSIS — R1310 Dysphagia, unspecified: Secondary | ICD-10-CM | POA: Diagnosis present

## 2018-06-20 DIAGNOSIS — Z66 Do not resuscitate: Secondary | ICD-10-CM | POA: Diagnosis not present

## 2018-06-20 DIAGNOSIS — I739 Peripheral vascular disease, unspecified: Secondary | ICD-10-CM | POA: Diagnosis present

## 2018-06-20 DIAGNOSIS — Z800 Family history of malignant neoplasm of digestive organs: Secondary | ICD-10-CM

## 2018-06-20 DIAGNOSIS — Z7401 Bed confinement status: Secondary | ICD-10-CM | POA: Diagnosis not present

## 2018-06-20 DIAGNOSIS — R609 Edema, unspecified: Secondary | ICD-10-CM | POA: Diagnosis not present

## 2018-06-20 DIAGNOSIS — R29898 Other symptoms and signs involving the musculoskeletal system: Secondary | ICD-10-CM | POA: Diagnosis not present

## 2018-06-20 DIAGNOSIS — Z515 Encounter for palliative care: Secondary | ICD-10-CM | POA: Diagnosis not present

## 2018-06-20 DIAGNOSIS — G9341 Metabolic encephalopathy: Secondary | ICD-10-CM | POA: Diagnosis present

## 2018-06-20 DIAGNOSIS — R410 Disorientation, unspecified: Secondary | ICD-10-CM | POA: Diagnosis not present

## 2018-06-20 DIAGNOSIS — J9811 Atelectasis: Secondary | ICD-10-CM | POA: Diagnosis not present

## 2018-06-20 DIAGNOSIS — R0689 Other abnormalities of breathing: Secondary | ICD-10-CM | POA: Diagnosis not present

## 2018-06-20 DIAGNOSIS — I82A21 Chronic embolism and thrombosis of right axillary vein: Secondary | ICD-10-CM | POA: Diagnosis not present

## 2018-06-20 DIAGNOSIS — Z9049 Acquired absence of other specified parts of digestive tract: Secondary | ICD-10-CM

## 2018-06-20 DIAGNOSIS — R402112 Coma scale, eyes open, never, at arrival to emergency department: Secondary | ICD-10-CM | POA: Diagnosis present

## 2018-06-20 DIAGNOSIS — E860 Dehydration: Principal | ICD-10-CM | POA: Diagnosis present

## 2018-06-20 DIAGNOSIS — I10 Essential (primary) hypertension: Secondary | ICD-10-CM | POA: Diagnosis present

## 2018-06-20 DIAGNOSIS — E870 Hyperosmolality and hypernatremia: Secondary | ICD-10-CM | POA: Diagnosis present

## 2018-06-20 DIAGNOSIS — R64 Cachexia: Secondary | ICD-10-CM | POA: Diagnosis present

## 2018-06-20 DIAGNOSIS — Z7189 Other specified counseling: Secondary | ICD-10-CM

## 2018-06-20 DIAGNOSIS — M8580 Other specified disorders of bone density and structure, unspecified site: Secondary | ICD-10-CM | POA: Diagnosis present

## 2018-06-20 DIAGNOSIS — R5381 Other malaise: Secondary | ICD-10-CM | POA: Diagnosis not present

## 2018-06-20 DIAGNOSIS — I82C11 Acute embolism and thrombosis of right internal jugular vein: Secondary | ICD-10-CM | POA: Diagnosis present

## 2018-06-20 DIAGNOSIS — N179 Acute kidney failure, unspecified: Secondary | ICD-10-CM | POA: Diagnosis present

## 2018-06-20 DIAGNOSIS — E871 Hypo-osmolality and hyponatremia: Secondary | ICD-10-CM | POA: Diagnosis present

## 2018-06-20 DIAGNOSIS — M2550 Pain in unspecified joint: Secondary | ICD-10-CM | POA: Diagnosis not present

## 2018-06-20 DIAGNOSIS — R400 Somnolence: Secondary | ICD-10-CM | POA: Diagnosis not present

## 2018-06-20 DIAGNOSIS — R159 Full incontinence of feces: Secondary | ICD-10-CM | POA: Diagnosis present

## 2018-06-20 DIAGNOSIS — R4020 Unspecified coma: Secondary | ICD-10-CM | POA: Diagnosis not present

## 2018-06-20 DIAGNOSIS — M1990 Unspecified osteoarthritis, unspecified site: Secondary | ICD-10-CM | POA: Diagnosis present

## 2018-06-20 DIAGNOSIS — E861 Hypovolemia: Secondary | ICD-10-CM | POA: Diagnosis present

## 2018-06-20 DIAGNOSIS — R404 Transient alteration of awareness: Secondary | ICD-10-CM | POA: Diagnosis not present

## 2018-06-20 DIAGNOSIS — I872 Venous insufficiency (chronic) (peripheral): Secondary | ICD-10-CM | POA: Diagnosis not present

## 2018-06-20 DIAGNOSIS — R402352 Coma scale, best motor response, localizes pain, at arrival to emergency department: Secondary | ICD-10-CM | POA: Diagnosis present

## 2018-06-20 DIAGNOSIS — M810 Age-related osteoporosis without current pathological fracture: Secondary | ICD-10-CM | POA: Diagnosis present

## 2018-06-20 DIAGNOSIS — Z79890 Hormone replacement therapy (postmenopausal): Secondary | ICD-10-CM

## 2018-06-20 LAB — I-STAT ARTERIAL BLOOD GAS, ED
Acid-Base Excess: 3 mmol/L — ABNORMAL HIGH (ref 0.0–2.0)
Bicarbonate: 27.5 mmol/L (ref 20.0–28.0)
O2 Saturation: 96 %
PCO2 ART: 42.5 mmHg (ref 32.0–48.0)
Patient temperature: 98.6
TCO2: 29 mmol/L (ref 22–32)
pH, Arterial: 7.419 (ref 7.350–7.450)
pO2, Arterial: 79 mmHg — ABNORMAL LOW (ref 83.0–108.0)

## 2018-06-20 LAB — COMPREHENSIVE METABOLIC PANEL
ALT: 19 U/L (ref 0–44)
AST: 31 U/L (ref 15–41)
Albumin: 4 g/dL (ref 3.5–5.0)
Alkaline Phosphatase: 66 U/L (ref 38–126)
Anion gap: 16 — ABNORMAL HIGH (ref 5–15)
BUN: 49 mg/dL — ABNORMAL HIGH (ref 8–23)
CO2: 25 mmol/L (ref 22–32)
Calcium: 10.2 mg/dL (ref 8.9–10.3)
Chloride: 127 mmol/L — ABNORMAL HIGH (ref 98–111)
Creatinine, Ser: 1.38 mg/dL — ABNORMAL HIGH (ref 0.44–1.00)
GFR calc non Af Amer: 36 mL/min — ABNORMAL LOW (ref >60)
GFR, EST AFRICAN AMERICAN: 42 mL/min — AB (ref >60)
Glucose, Bld: 113 mg/dL — ABNORMAL HIGH (ref 70–99)
Potassium: 4.2 mmol/L (ref 3.5–5.1)
Sodium: 168 mmol/L (ref 135–145)
Total Bilirubin: 1.9 mg/dL — ABNORMAL HIGH (ref 0.3–1.2)
Total Protein: 7.4 g/dL (ref 6.5–8.1)

## 2018-06-20 LAB — URINALYSIS, ROUTINE W REFLEX MICROSCOPIC
Bilirubin Urine: NEGATIVE
Glucose, UA: NEGATIVE mg/dL
Hgb urine dipstick: NEGATIVE
Ketones, ur: 5 mg/dL — AB
Leukocytes, UA: NEGATIVE
Nitrite: NEGATIVE
Protein, ur: NEGATIVE mg/dL
SPECIFIC GRAVITY, URINE: 1.024 (ref 1.005–1.030)
pH: 5 (ref 5.0–8.0)

## 2018-06-20 LAB — AMMONIA: Ammonia: 16 umol/L (ref 9–35)

## 2018-06-20 LAB — DIFFERENTIAL
Abs Immature Granulocytes: 0.02 10*3/uL (ref 0.00–0.07)
Basophils Absolute: 0 10*3/uL (ref 0.0–0.1)
Basophils Relative: 0 %
Eosinophils Absolute: 0 10*3/uL (ref 0.0–0.5)
Eosinophils Relative: 0 %
Immature Granulocytes: 0 %
Lymphocytes Relative: 11 %
Lymphs Abs: 0.9 10*3/uL (ref 0.7–4.0)
Monocytes Absolute: 0.5 10*3/uL (ref 0.1–1.0)
Monocytes Relative: 5 %
Neutro Abs: 7.3 10*3/uL (ref 1.7–7.7)
Neutrophils Relative %: 84 %

## 2018-06-20 LAB — BASIC METABOLIC PANEL
Anion gap: 9 (ref 5–15)
BUN: 43 mg/dL — AB (ref 8–23)
CO2: 31 mmol/L (ref 22–32)
Calcium: 9.6 mg/dL (ref 8.9–10.3)
Chloride: 126 mmol/L — ABNORMAL HIGH (ref 98–111)
Creatinine, Ser: 1.11 mg/dL — ABNORMAL HIGH (ref 0.44–1.00)
GFR calc Af Amer: 55 mL/min — ABNORMAL LOW (ref >60)
GFR calc non Af Amer: 47 mL/min — ABNORMAL LOW (ref >60)
Glucose, Bld: 156 mg/dL — ABNORMAL HIGH (ref 70–99)
Potassium: 3.6 mmol/L (ref 3.5–5.1)
Sodium: 166 mmol/L (ref 135–145)

## 2018-06-20 LAB — CBC
HCT: 56.8 % — ABNORMAL HIGH (ref 36.0–46.0)
Hemoglobin: 16.7 g/dL — ABNORMAL HIGH (ref 12.0–15.0)
MCH: 30.5 pg (ref 26.0–34.0)
MCHC: 29.4 g/dL — AB (ref 30.0–36.0)
MCV: 103.8 fL — ABNORMAL HIGH (ref 80.0–100.0)
Platelets: 126 10*3/uL — ABNORMAL LOW (ref 150–400)
RBC: 5.47 MIL/uL — ABNORMAL HIGH (ref 3.87–5.11)
RDW: 13.3 % (ref 11.5–15.5)
WBC: 8.7 10*3/uL (ref 4.0–10.5)
nRBC: 0 % (ref 0.0–0.2)

## 2018-06-20 LAB — RAPID URINE DRUG SCREEN, HOSP PERFORMED
Amphetamines: NOT DETECTED
Barbiturates: NOT DETECTED
Benzodiazepines: NOT DETECTED
Cocaine: NOT DETECTED
Opiates: NOT DETECTED
Tetrahydrocannabinol: NOT DETECTED

## 2018-06-20 LAB — PROTIME-INR
INR: 1.19
Prothrombin Time: 15 seconds (ref 11.4–15.2)

## 2018-06-20 LAB — ETHANOL

## 2018-06-20 LAB — TROPONIN I: Troponin I: 0.05 ng/mL (ref <0.03)

## 2018-06-20 LAB — APTT: aPTT: 20 seconds — ABNORMAL LOW (ref 24–36)

## 2018-06-20 MED ORDER — SODIUM CHLORIDE 0.9 % IV BOLUS (SEPSIS)
500.0000 mL | Freq: Once | INTRAVENOUS | Status: AC
  Administered 2018-06-20: 500 mL via INTRAVENOUS

## 2018-06-20 MED ORDER — SODIUM CHLORIDE 0.9 % IV SOLN
1000.0000 mL | INTRAVENOUS | Status: DC
  Administered 2018-06-20: 1000 mL via INTRAVENOUS

## 2018-06-20 MED ORDER — LEVOTHYROXINE SODIUM 100 MCG/5ML IV SOLN
25.0000 ug | Freq: Every day | INTRAVENOUS | Status: DC
  Administered 2018-06-20: 25 ug via INTRAVENOUS
  Filled 2018-06-20 (×2): qty 5

## 2018-06-20 MED ORDER — ENOXAPARIN SODIUM 30 MG/0.3ML ~~LOC~~ SOLN
30.0000 mg | SUBCUTANEOUS | Status: DC
  Administered 2018-06-21 – 2018-06-24 (×4): 30 mg via SUBCUTANEOUS
  Filled 2018-06-20 (×4): qty 0.3

## 2018-06-20 MED ORDER — DEXTROSE 5 % IV SOLN
INTRAVENOUS | Status: DC
  Administered 2018-06-20: 17:00:00 via INTRAVENOUS

## 2018-06-20 NOTE — ED Notes (Signed)
Report attempted 

## 2018-06-20 NOTE — ED Provider Notes (Signed)
[facility] Provider Note  Level 5 caveat: Altered mental status CSN: [id number] Arrival date & time: 06/20/18  1345   History   Chief Complaint ALOC  HPI [name] is a 77 y.o. female.  HPI The patient presents the emergency room for evaluation of altered mental status.  Patient lives at home with family.  According to EMS family member states she was normal as of last evening at about 10:30 in the evening.  EMS also states that the home nurse however felt that she had not been acting like her normal self back on Monday.  This morning they thought she was sleeping.  At around 1:00 they tried to wake her up and were unable to get the patient to respond.  EMS was called.  In the ED the patient is unresponsive other than to painful stimuli.  I have unable to get any further history. Past Medical History:  Diagnosis Date  . Abnormal chest x-ray   . Anxiety   . Breast cancer ([place])    stage 0 left  . Diverticulosis of colon   . DJD (degenerative joint disease)   . Glaucoma   . Hip pain   . Hypercholesteremia   . Hypertension   . Hypothyroid   . Lumbar back pain   . Osteopenia   . Parkinson disease ([place])   . Peripheral vascular disease ([place])   . Venous insufficiency   . Vitamin D deficiency     Patient Active Problem List   Diagnosis Date Noted  . Hyperglycemia 05/29/2018  . Head trauma 05/29/2018  . Malnutrition compromising bodily function ([place]) 01/08/2017  . Falls frequently 08/05/2015  . Urolithiasis 04/21/2015  . Osteoporosis 04/18/2015  . Routine general medical examination at a health care facility 09/14/2014  . Parkinson's disease ([place]) 10/30/2012  . Severe dementia ([place]) 10/30/2012  . Stenosis, spinal, lumbar 05/19/2012  . Breast cancer, left breast ([place]) 05/22/2010  . Vitamin D deficiency 04/26/2008  . GLAUCOMA 11/09/2007  . HYPERCHOLESTEROLEMIA 10/27/2007  . Hypothyroidism 05/01/2007  . Essential hypertension  05/01/2007  . DEGENERATIVE JOINT DISEASE 05/01/2007    Past Surgical History:  Procedure Laterality Date  . BREAST LUMPECTOMY  8/11 by DrWakefield  . cataract surgery and argon laser trabeculoplasty  04/2010   Dr. Herbert Deaner  . CHOLECYSTECTOMY    . COLONOSCOPY    . INCISE AND DRAIN ABCESS  10/04/11   sebaceous cyst on back  . lapraoscopic cholecystectomy  09/2003   Dr. Dalbert Batman  . lumbar laminectomy and foraminotomies for spinal stenosis  11/2005   Dr. Gladstone Lighter  . LUMBAR LAMINECTOMY/DECOMPRESSION MICRODISCECTOMY  05/20/2012   Procedure: LUMBAR LAMINECTOMY/DECOMPRESSION MICRODISCECTOMY 2 LEVELS;  Surgeon: Tobi Bastos, MD;  Location: [facility];  Service: Orthopedics;  Laterality: N/A;  . OVARIAN CYST REMOVAL    . POLYPECTOMY       OB History   No obstetric history on file.      Home Medications    Prior to Admission medications   Medication Sig Start Date End Date Taking? Authorizing Provider  Bromfenac Sodium 0.09 % SOLN  03/26/09   [provider]  carbidopa-levodopa-entacapone (STALEVO) 25-100-200 MG tablet Take 1 tablet by mouth 3 (three) times daily. 02/25/18   Penumalli, Earlean Polka, MD  Cholecalciferol (CVS D3) 2000 units CAPS Take 1 capsule (2,000 Units total) by mouth daily. 08/26/17   Janith Lima, MD  cycloSPORINE (RESTASIS) 0.05 % ophthalmic emulsion  02/04/09   [provider]  dorzolamide (TRUSOPT)  2 % ophthalmic solution INSTILL 1 DROP INTO LEFT EYE TWICE A DAY 04/03/18   [provider]  latanoprost (XALATAN) 0.005 % ophthalmic solution one drop in each eye at bedtime 05/10/08   [provider]  levothyroxine (SYNTHROID, LEVOTHROID) 50 MCG tablet Take 1 tablet (50 mcg total) by mouth daily before breakfast. 04/08/17   Janith Lima, MD  moxifloxacin (VIGAMOX) 0.5 % ophthalmic solution  03/26/09   [provider]  SIMBRINZA 1-0.2 % SUSP INSTILL 1 DROP INTO BOTH EYES THREE TIMES A DAY 04/24/18   [provider]    Family  History Family History  Problem Relation Age of Onset  . Colon cancer Father   . Osteoporosis Neg Hx     Social History Social History   Tobacco Use  . Smoking status: Never Smoker  . Smokeless tobacco: Never Used  Substance Use Topics  . Alcohol use: No  . Drug use: No     Allergies   Patient has no known allergies.   Review of Systems Review of Systems  All other systems reviewed and are negative.    Physical Exam Updated Vital Signs BP 126/65   Pulse 70   Temp 98 F (36.7 C) (Rectal)   Resp 14   Ht 1.575 m (5\' 2" )   Wt 31.8 kg   SpO2 97%   BMI 12.80 kg/m   Physical Exam Vitals signs and nursing note reviewed.  Constitutional:      Appearance: She is well-developed. She is ill-appearing.     Comments: Patient is extremely underweight, cachectic, appears much older than her stated age  HENT:     Head: Normocephalic and atraumatic.     Right Ear: External ear normal.     Left Ear: External ear normal.     Mouth/Throat:     Mouth: Mucous membranes are dry.     Comments: Cracked lips Eyes:     General: No scleral icterus.       Right eye: No discharge.        Left eye: No discharge.     Conjunctiva/sclera: Conjunctivae normal.  Neck:     Musculoskeletal: Neck supple.     Trachea: No tracheal deviation.  Cardiovascular:     Rate and Rhythm: Normal rate and regular rhythm.  Pulmonary:     Effort: Pulmonary effort is normal. No respiratory distress.     Breath sounds: Normal breath sounds. No stridor. No wheezing or rales.  Abdominal:     General: Bowel sounds are normal. There is no distension.     Palpations: Abdomen is soft.     Tenderness: There is no abdominal tenderness. There is no guarding or rebound.  Musculoskeletal:        General: No tenderness.  Skin:    General: Skin is warm and dry.     Findings: No rash.  Neurological:     GCS: GCS eye subscore is 1. GCS verbal subscore is 1. GCS motor subscore is 5.     Cranial Nerves: Cranial  nerve deficit: no facial droop,       Motor: No abnormal muscle tone or seizure activity.     Comments: Patient is minimally responsive to painful stimuli, she does not answer any questions or open her eyes      ED Treatments / Results  Labs (all labs ordered are listed, but only abnormal results are displayed) Labs Reviewed  APTT - Abnormal; Notable for the following components:  Result Value   aPTT 20 (*)    All other components within normal limits  CBC - Abnormal; Notable for the following components:   RBC 5.47 (*)    Hemoglobin 16.7 (*)    HCT 56.8 (*)    MCV 103.8 (*)    MCHC 29.4 (*)    Platelets 126 (*)    All other components within normal limits  COMPREHENSIVE METABOLIC PANEL - Abnormal; Notable for the following components:   Sodium 168 (*)    Chloride 127 (*)    Glucose, Bld 113 (*)    BUN 49 (*)    Creatinine, Ser 1.38 (*)    Total Bilirubin 1.9 (*)    GFR calc non Af Amer 36 (*)    GFR calc Af Amer 42 (*)    Anion gap 16 (*)    All other components within normal limits  URINALYSIS, ROUTINE W REFLEX MICROSCOPIC - Abnormal; Notable for the following components:   Color, Urine AMBER (*)    Ketones, ur 5 (*)    All other components within normal limits  TROPONIN I - Abnormal; Notable for the following components:   Troponin I 0.05 (*)    All other components within normal limits  I-STAT ARTERIAL BLOOD GAS, ED - Abnormal; Notable for the following components:   pO2, Arterial 79.0 (*)    Acid-Base Excess 3.0 (*)    All other components within normal limits  ETHANOL  PROTIME-INR  DIFFERENTIAL  RAPID URINE DRUG SCREEN, HOSP PERFORMED  AMMONIA    EKG EKG Interpretation  Date/Time:  Friday June 20 2018 13:54:38 EST Ventricular Rate:  93 PR Interval:    QRS Duration: 87 QT Interval:  370 QTC Calculation: 461 R Axis:   -62 Text Interpretation:  Sinus rhythm Consider right atrial enlargement LAD, consider LAFB or inferior infarct Probable  anteroseptal infarct, old Baseline wander in lead(s) II III aVL aVF No significant change since last tracing Confirmed by Dorie Rank [phone]) on 06/20/2018 2:05:43 PM   Radiology Dg Chest Portable 1 View  Result Date: 06/20/2018 CLINICAL DATA:  Hypertension.  Altered mental status EXAM: PORTABLE CHEST 1 VIEW COMPARISON:  June 17, 2013 FINDINGS: There is elevation of the right hemidiaphragm with mild right base atelectasis. There is a small calcified granuloma in the left upper lobe. There is no edema or consolidation. The heart size and pulmonary vascularity are normal. There is aortic atherosclerosis. No adenopathy. No bone lesions. IMPRESSION: Stable elevation right hemidiaphragm with right base atelectasis. No edema or consolidation. Small granuloma left upper lobe. Stable cardiac silhouette. There is aortic atherosclerosis. Aortic Atherosclerosis (ICD10-I70.0). Electronically Signed   By: Lowella Grip III M.D.   On: 06/20/2018 14:58    Procedures .Critical Care Performed by: Dorie Rank, MD Authorized by: Dorie Rank, MD   Critical care provider statement:    Critical care time (minutes):  45   Critical care was time spent personally by me on the following activities:  Discussions with consultants, evaluation of patient's response to treatment, examination of patient, ordering and performing treatments and interventions, ordering and review of laboratory studies, ordering and review of radiographic studies, pulse oximetry, re-evaluation of patient's condition, obtaining history from patient or surrogate and review of old charts   (including critical care time)  Medications Ordered in ED Medications  sodium chloride 0.9 % bolus 500 mL (0 mLs Intravenous Stopped 06/20/18 1454)    Followed by  0.9 %  sodium chloride infusion (1,000 mLs Intravenous New  Bag/Given 06/20/18 1455)     Initial Impression / Assessment and Plan / ED Course  I have reviewed the triage vital signs and the  nursing notes.  Pertinent labs & imaging results that were available during my care of the patient were reviewed by me and considered in my medical decision making (see chart for details).  Clinical Course as of Jun 21 1543  Fri Jun 20, 2018  1543 Labs reviewed.  No acidosis.  No significant hypoxia.  No evidence of UTI.  Hemoglobin is increased compared to previous values.  Sodium is significantly elevated.  She also has evidence of acute kidney injury.  Symptoms are likely related to decreased p.o. intake   [JK]    Clinical Course User Index [JK] Dorie Rank, MD   Patient presented with altered mental status.  Patient's ED work-up was notable for severe hypernatremia.  This is the cause of her altered mental status.  This may be related to decreased p.o. intake.  Family members have noticed decline over the last couple weeks.  She has had more difficulty swallowing eating.  She has not had any vomiting and this swallowing issue may be related to her Parkinson's.  I have ordered IV fluids, will change to dextrose 3 ml/lg/hr.  I will continue hydration and consult with the medical service for admission.  Final Clinical Impressions(s) / ED Diagnoses   Final diagnoses:  Hypernatremia  Altered mental status, unspecified altered mental status type      Dorie Rank, MD 06/20/18 1551

## 2018-06-20 NOTE — H&P (Signed)
History and Physical  [name] YSA:[id number] DOB: [birth year] DOA: 06/20/2018  Referring physician: Dr Tomi Bamberger  PCP: Janith Lima, MD  Outpatient Specialists:  None Patient coming from: Home  Chief Complaint: Somnolence  HPI: [name] is a 77 y.o. female with past medical history significant for Parkinson disease, advanced dementia, failure to thrive, hypothyroidism, who presented to [facility] ED from home where she lives with her husband due to somnolence with difficulty to arouse of 1 day duration.  History is obtained mainly from her daughter who is an Therapist, sports and also her medical power of attorney.  Unable to obtain history from the patient due to persistent altered mental status.  Patient has had gradual decrease in oral intake for months and gradually worsening in the last 2 weeks.  Was taken to her primary care provider on December 5, lab studies notable for hypernatremia with a sodium level of 147 at that time.  Per family, patient has had no vomiting or diarrhea.  Has persistently had difficulty swallowing solid and has done well with Ensure drinks.  No fevers or chills reported at home.  No noticeable focal neurological deficits.  She has been falling frequently.  ED Course: Upon presentation to the ED, patient is somnolent and arouses only to familiar voices.  Lab studies remarkable for sodium of 168 and elevated creatinine of 1.38 with normal baseline.  She appears emaciated and hypovolemic.  Was started on D5W.  CT head and urine analysis unremarkable.  Review of Systems: Review of systems as noted in the HPI. All other systems reviewed and are negative.   Past Medical History:  Diagnosis Date  . Abnormal chest x-ray   . Anxiety   . Breast cancer ([place])    stage 0 left  . Diverticulosis of colon   . DJD (degenerative joint disease)   . Glaucoma   . Hip pain   . Hypercholesteremia   . Hypertension   . Hypothyroid   . Lumbar back pain   . Osteopenia   . Parkinson disease  ([place])   . Peripheral vascular disease ([place])   . Venous insufficiency   . Vitamin D deficiency    Past Surgical History:  Procedure Laterality Date  . BREAST LUMPECTOMY  8/11 by DrWakefield  . cataract surgery and argon laser trabeculoplasty  04/2010   Dr. Herbert Deaner  . CHOLECYSTECTOMY    . COLONOSCOPY    . INCISE AND DRAIN ABCESS  10/04/11   sebaceous cyst on back  . lapraoscopic cholecystectomy  09/2003   Dr. Dalbert Batman  . lumbar laminectomy and foraminotomies for spinal stenosis  11/2005   Dr. Gladstone Lighter  . LUMBAR LAMINECTOMY/DECOMPRESSION MICRODISCECTOMY  05/20/2012   Procedure: LUMBAR LAMINECTOMY/DECOMPRESSION MICRODISCECTOMY 2 LEVELS;  Surgeon: Tobi Bastos, MD;  Location: [facility];  Service: Orthopedics;  Laterality: N/A;  . OVARIAN CYST REMOVAL    . POLYPECTOMY      Social History:  reports that she has never smoked. She has never used smokeless tobacco. She reports that she does not drink alcohol or use drugs.   No Known Allergies  Family History  Problem Relation Age of Onset  . Colon cancer Father   . Osteoporosis Neg Hx       Prior to Admission medications   Medication Sig Start Date End Date Taking? Authorizing Provider  carbidopa-levodopa-entacapone (STALEVO) 25-100-200 MG tablet Take 1 tablet by mouth 3 (three) times daily. Patient taking differently: Take 1 tablet by mouth 2 (two) times daily.  02/25/18  Yes  Penumalli, Earlean Polka, MD  Cholecalciferol (CVS D3) 2000 units CAPS Take 1 capsule (2,000 Units total) by mouth daily. 08/26/17  Yes Janith Lima, MD  levothyroxine (SYNTHROID, LEVOTHROID) 50 MCG tablet Take 1 tablet (50 mcg total) by mouth daily before breakfast. 04/08/17  Yes Janith Lima, MD  Brinzolamide-Brimonidine [facility]) 1-0.2 % SUSP Place 1 drop into both eyes 2 (two) times daily.    [provider]  latanoprost (XALATAN) 0.005 % ophthalmic solution Place 1 drop into both eyes at bedtime.  05/10/08   [provider]    Physical  Exam: BP 126/65   Pulse 76   Temp 98 F (36.7 C) (Rectal)   Resp 15   Ht 5\' 2"  (1.575 m)   Wt 31.8 kg   SpO2 97%   BMI 12.80 kg/m   . General: 77 y.o. year-old female severely emaciated and somnolent.  Arouses only to familiar voices.  . Cardiovascular: Regular rate and rhythm with no rubs or gallops.  No thyromegaly or JVD noted.  No lower extremity edema. 2/4 pulses in all 4 extremities. Marland Kitchen Respiratory: Clear to auscultation with no wheezes or rales. Good inspiratory effort. . Abdomen: Soft nontender nondistended with hypoactive bowel sounds x4 quadrants. . Muskuloskeletal: No cyanosis, clubbing or edema noted bilaterally . Neuro: CN II-XII intact, strength, sensation, reflexes . Skin: No ulcerative lesions noted or rashes on limited physical exam . Psychiatry: Unable to assess judgement and insight due to altered mental status.  Unable to assess mood due to altered mental status.          Labs on Admission:  Basic Metabolic Panel: Recent Labs  Lab 06/20/18 1400  NA 168*  K 4.2  CL 127*  CO2 25  GLUCOSE 113*  BUN 49*  CREATININE 1.38*  CALCIUM 10.2   Liver Function Tests: Recent Labs  Lab 06/20/18 1400  AST 31  ALT 19  ALKPHOS 66  BILITOT 1.9*  PROT 7.4  ALBUMIN 4.0   No results for input(s): LIPASE, AMYLASE in the last 168 hours. Recent Labs  Lab 06/20/18 1400  AMMONIA 16   CBC: Recent Labs  Lab 06/20/18 1400  WBC 8.7  NEUTROABS 7.3  HGB 16.7*  HCT 56.8*  MCV 103.8*  PLT 126*   Cardiac Enzymes: Recent Labs  Lab 06/20/18 1400  TROPONINI 0.05*    BNP (last 3 results) No results for input(s): BNP in the last 8760 hours.  ProBNP (last 3 results) No results for input(s): PROBNP in the last 8760 hours.  CBG: No results for input(s): GLUCAP in the last 168 hours.  Radiological Exams on Admission: Ct Head Wo Contrast  Result Date: 06/20/2018 CLINICAL DATA:  Altered level of consciousness, confusion EXAM: CT HEAD WITHOUT CONTRAST  TECHNIQUE: Contiguous axial images were obtained from the base of the skull through the vertex without intravenous contrast. COMPARISON:  CT brain of 05/29/2017 and CT brain of 12/16/2015 FINDINGS: Brain: Ventricular dilatation is unchanged compared to the prior CT as are the prominent cortical sulci, consistent with diffuse atrophy. The septum is in normal midline position. Moderate small vessel ischemic changes again noted throughout the periventricular white matter. No hemorrhage, mass lesion, or acute infarction is seen. Vascular: No vascular abnormality is noted on this unenhanced study. Skull: On bone window images, probable old calcified meningioma is present in the left frontal region and is stable. No acute calvarial abnormality is seen. Sinuses/Orbits: The paranasal sinuses appear well pneumatized. Some extrusion of fat is noted within the  right maxillary sinus from the inferior right orbit which was present previously and may be due to prior trauma. Other: None. IMPRESSION: 1. Stable atrophy and moderately severe small vessel ischemic change. No acute intracranial abnormality. 2. Some extrusion of fat is noted from the inferior right lobe into the right maxillary sinus most likely due to prior trauma. This is unchanged compared to the prior CT of 12/16/2015. Electronically Signed   By: Ivar Drape M.D.   On: 06/20/2018 16:13   Dg Chest Portable 1 View  Result Date: 06/20/2018 CLINICAL DATA:  Hypertension.  Altered mental status EXAM: PORTABLE CHEST 1 VIEW COMPARISON:  June 17, 2013 FINDINGS: There is elevation of the right hemidiaphragm with mild right base atelectasis. There is a small calcified granuloma in the left upper lobe. There is no edema or consolidation. The heart size and pulmonary vascularity are normal. There is aortic atherosclerosis. No adenopathy. No bone lesions. IMPRESSION: Stable elevation right hemidiaphragm with right base atelectasis. No edema or consolidation. Small  granuloma left upper lobe. Stable cardiac silhouette. There is aortic atherosclerosis. Aortic Atherosclerosis (ICD10-I70.0). Electronically Signed   By: Lowella Grip III M.D.   On: 06/20/2018 14:58    EKG: I independently viewed the EKG done and my findings are as followed: Rhythm rate of 67 with no specific ST-T changes.  Assessment/Plan Present on Admission: . Dehydration with hypernatremia  Active Problems:   Dehydration with hypernatremia  Acute on chronic hypovolemic hypernatremia suspect secondary to severe dehydration complicated by poor oral intake and suspected dysphagia No reported vomiting or diarrhea Per her daughter who is also RN lab studies done at PCP's office on 05/29/2018 showed sodium 147 Presented to the ED with sodium of 168 Continue D5W at 75 cc/h BMP every 3 hours Goal is to drop sodium level by 10 point in 24 hours Obtain swallow evaluation by speech therapy Monitor urine output N.p.o. until passes swallow evaluation No reported vomiting or diarrhea  Acute metabolic encephalopathy suspect secondary to severe hypernatremia in the setting of advanced dementia CT head and UA negative Management as stated above Reorient as needed Fall precautions  AKI, prerenal secondary to severe dehydration At baseline creatinine is 0.8 with GFR greater than 60 Presented with creatinine of 1.38 with GFR of 42 Rehydrate Repeat BMP in the morning Monitor urine output  Failure to thrive in an adult with suspicion for dysphagia Poor oral intake Speech therapy to evaluate for swallow safety N.p.o. until passes swallow evaluation Dietary consult once no longer n.p.o.  Parkinson's disease Appears advanced Palliative care consult in the morning  Ambulatory dysfunction with frequent falls PT to assess in the morning when alert Fall precautions  Risks: Patient is high risk for decompensation due to severe hypernatremia requiring close monitoring and frequent labs,  severe dehydration, multiple comorbidities and advanced age.  Patient will require at least 2 midnights for further evaluation and treatment of present condition.   DVT prophylaxis: Subcu Lovenox daily  Code Status: Full code, confirmed by her daughter who is Therapist, sports and also her medical power of attorney.  Family Communication: Communicated with her daughter who is also medical power of attorney.  Disposition Plan: Admit to telemetry unit  Consults called: None  Admission status: Inpatient status    Kayleen Memos MD [facility] Pager [phone]  If 7PM-7AM, please contact night-coverage [email or web address] Password [facility]  06/20/2018, 5:40 PM

## 2018-06-20 NOTE — ED Notes (Signed)
Family at bedside. 

## 2018-06-20 NOTE — Progress Notes (Signed)
COMMUNITY PALLIATIVE CARE SW NOTE  PATIENT NAME: [name] DOB: 13-Aug-1940 MRN: [id number]  PRIMARY CARE PROVIDER: Janith Lima, MD  RESPONSIBLE PARTY:  Acct ID - Guarantor Home Phone Work Phone Relationship Acct Type  [id number] - Mesenbrink,[phone]  Self P/F     [place], [place], [place] [address]   PLAN OF CARE and INTERVENTIONS:             1. GOALS OF CARE/ ADVANCE CARE PLANNING:  Goal is for patient to remain at home with her husband, [name].  She is a full code.   2. SOCIAL/EMOTIONAL/SPIRITUAL ASSESSMENT/ INTERVENTIONS:  SW met with patient's husband briefly, as patient was still sleeping.  He stated patient was doing well and had no needs at the time. 3. PATIENT/CAREGIVER EDUCATION/ COPING:  Both patient and her husband express themselves openly. 4. PERSONAL EMERGENCY PLAN:  [name] will call his daughter, [name], for medical issues.  She is an Therapist, sports. 5. 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 6. FINANCIAL/LEGAL CONCERNS/INTERVENTIONS:  None.      SOCIAL HX:  Social History   Tobacco Use  . Smoking status: Never Smoker  . Smokeless tobacco: Never Used  Substance Use Topics  . Alcohol use: No   CODE STATUS:  Full Code ADVANCED DIRECTIVES: Y MOST FORM COMPLETE:  N HOSPICE EDUCATION PROVIDED: N Patient was not present. Duration of visit and documentation:  30 minutes.      [place] Corn Elsa Ploch, LCSW

## 2018-06-20 NOTE — ED Notes (Signed)
Report given to Elizabeth RN.

## 2018-06-20 NOTE — ED Triage Notes (Signed)
Patient presents to ED via [place] [facility] states patient has been altered since Monday, however husband states she was at her normal state at 10 pm last pm. Per husband her normal is ambulates with assistance , talks however is very confused.

## 2018-06-20 NOTE — Progress Notes (Signed)
Received report called ED

## 2018-06-21 ENCOUNTER — Inpatient Hospital Stay (HOSPITAL_COMMUNITY): Payer: Medicare Other

## 2018-06-21 ENCOUNTER — Inpatient Hospital Stay: Payer: Self-pay

## 2018-06-21 DIAGNOSIS — F0280 Dementia in other diseases classified elsewhere without behavioral disturbance: Secondary | ICD-10-CM

## 2018-06-21 DIAGNOSIS — E870 Hyperosmolality and hypernatremia: Secondary | ICD-10-CM

## 2018-06-21 DIAGNOSIS — R4182 Altered mental status, unspecified: Secondary | ICD-10-CM

## 2018-06-21 DIAGNOSIS — G20 Parkinson's disease: Secondary | ICD-10-CM

## 2018-06-21 DIAGNOSIS — E43 Unspecified severe protein-calorie malnutrition: Secondary | ICD-10-CM

## 2018-06-21 HISTORY — PX: IR FLUORO GUIDE CV LINE LEFT: IMG2282

## 2018-06-21 HISTORY — PX: IR US GUIDE VASC ACCESS LEFT: IMG2389

## 2018-06-21 LAB — BASIC METABOLIC PANEL
Anion gap: 14 (ref 5–15)
Anion gap: 8 (ref 5–15)
BUN: 44 mg/dL — AB (ref 8–23)
BUN: 30 mg/dL — ABNORMAL HIGH (ref 8–23)
CHLORIDE: 123 mmol/L — AB (ref 98–111)
CO2: 26 mmol/L (ref 22–32)
CO2: 28 mmol/L (ref 22–32)
Calcium: 9.6 mg/dL (ref 8.9–10.3)
Calcium: 9.1 mg/dL (ref 8.9–10.3)
Chloride: 127 mmol/L — ABNORMAL HIGH (ref 98–111)
Creatinine, Ser: 1.04 mg/dL — ABNORMAL HIGH (ref 0.44–1.00)
Creatinine, Ser: 0.77 mg/dL (ref 0.44–1.00)
GFR calc Af Amer: 59 mL/min — ABNORMAL LOW (ref >60)
GFR calc Af Amer: >60 mL/min (ref >60)
GFR calc non Af Amer: 51 mL/min — ABNORMAL LOW (ref >60)
GFR calc non Af Amer: >60 mL/min (ref >60)
Glucose, Bld: 138 mg/dL — ABNORMAL HIGH (ref 70–99)
Glucose, Bld: 236 mg/dL — ABNORMAL HIGH (ref 70–99)
Potassium: 4 mmol/L (ref 3.5–5.1)
Potassium: 3.3 mmol/L — ABNORMAL LOW (ref 3.5–5.1)
SODIUM: 167 mmol/L — AB (ref 135–145)
Sodium: 159 mmol/L — ABNORMAL HIGH (ref 135–145)

## 2018-06-21 LAB — PROTIME-INR
INR: 1.42
Prothrombin Time: 17.2 seconds — ABNORMAL HIGH (ref 11.4–15.2)

## 2018-06-21 LAB — TSH: TSH: 11.39 u[IU]/mL — ABNORMAL HIGH (ref 0.350–4.500)

## 2018-06-21 LAB — MAGNESIUM: Magnesium: 2.1 mg/dL (ref 1.7–2.4)

## 2018-06-21 MED ORDER — SODIUM CHLORIDE 0.9% FLUSH: 10.0000 mL | INTRAVENOUS | Status: DC | PRN

## 2018-06-21 MED ORDER — LEVOTHYROXINE SODIUM 100 MCG/5ML IV SOLN
25.0000 ug | Freq: Every day | INTRAVENOUS | Status: DC
  Administered 2018-06-21 – 2018-06-25 (×5): 25 ug via INTRAVENOUS
  Filled 2018-06-21 (×5): qty 5

## 2018-06-21 MED ORDER — CARBIDOPA-LEVODOPA 25-100 MG PO TABS
1.0000 | ORAL_TABLET | Freq: Two times a day (BID) | ORAL | Status: DC
  Filled 2018-06-21: qty 1

## 2018-06-21 MED ORDER — DEXTROSE 5 % IV SOLN
INTRAVENOUS | Status: DC
  Administered 2018-06-21: 05:00:00 via INTRAVENOUS

## 2018-06-21 MED ORDER — VITAMIN D 25 MCG (1000 UNIT) PO TABS
2000.0000 [IU] | ORAL_TABLET | Freq: Every day | ORAL | Status: DC
  Filled 2018-06-21 (×2): qty 2

## 2018-06-21 MED ORDER — LATANOPROST 0.005 % OP SOLN
1.0000 [drp] | Freq: Every day | OPHTHALMIC | Status: DC
  Administered 2018-06-24: 1 [drp] via OPHTHALMIC
  Filled 2018-06-21: qty 2.5

## 2018-06-21 MED ORDER — DEXTROSE 5 % IV SOLN
INTRAVENOUS | Status: DC
  Administered 2018-06-21: 23:00:00 via INTRAVENOUS

## 2018-06-21 MED ORDER — LEVOTHYROXINE SODIUM 100 MCG/5ML IV SOLN: 25.0000 ug | Freq: Every day | INTRAVENOUS | Status: DC

## 2018-06-21 MED ORDER — DEXTROSE 5 % IV SOLN
INTRAVENOUS | Status: DC
  Administered 2018-06-21: 12:00:00 via INTRAVENOUS

## 2018-06-21 MED ORDER — FREE WATER: 300.0000 mL | Freq: Four times a day (QID) | Status: DC

## 2018-06-21 MED ORDER — LEVOTHYROXINE SODIUM 50 MCG PO TABS: 50.0000 ug | ORAL_TABLET | Freq: Every day | ORAL | Status: DC

## 2018-06-21 MED ORDER — FREE WATER: 200.0000 mL | Freq: Three times a day (TID) | Status: DC

## 2018-06-21 MED ORDER — LIDOCAINE HCL (PF) 1 % IJ SOLN
INTRAMUSCULAR | Status: AC
  Filled 2018-06-21: qty 30

## 2018-06-21 MED ORDER — ENTACAPONE 200 MG PO TABS
200.0000 mg | ORAL_TABLET | Freq: Two times a day (BID) | ORAL | Status: DC
  Filled 2018-06-21 (×9): qty 1

## 2018-06-21 MED ORDER — CARBIDOPA-LEVODOPA-ENTACAPONE 25-100-200 MG PO TABS: 1.0000 | ORAL_TABLET | Freq: Two times a day (BID) | ORAL | Status: DC

## 2018-06-21 NOTE — Evaluation (Signed)
Clinical/Bedside Swallow Evaluation Patient Details  Name: [name] MRN: [id number] Date of Birth: 1940-12-02  Today's Date: 06/21/2018 Time: SLP Start Time (ACUTE ONLY): 0919 SLP Stop Time (ACUTE ONLY): 0947 SLP Time Calculation (min) (ACUTE ONLY): 28 min  Past Medical History:  Past Medical History:  Diagnosis Date  . Abnormal chest x-ray   . Anxiety   . Breast cancer ([place])    stage 0 left  . Diverticulosis of colon   . DJD (degenerative joint disease)   . Glaucoma   . Hip pain   . Hypercholesteremia   . Hypertension   . Hypothyroid   . Lumbar back pain   . Osteopenia   . Parkinson disease ([place])   . Peripheral vascular disease ([place])   . Venous insufficiency   . Vitamin D deficiency    Past Surgical History:  Past Surgical History:  Procedure Laterality Date  . BREAST LUMPECTOMY  8/11 by [facility]  . cataract surgery and argon laser trabeculoplasty  04/2010   Dr. Herbert Deaner  . CHOLECYSTECTOMY    . COLONOSCOPY    . INCISE AND DRAIN ABCESS  10/04/11   sebaceous cyst on back  . lapraoscopic cholecystectomy  09/2003   Dr. Dalbert Batman  . lumbar laminectomy and foraminotomies for spinal stenosis  11/2005   Dr. Gladstone Lighter  . LUMBAR LAMINECTOMY/DECOMPRESSION MICRODISCECTOMY  05/20/2012   Procedure: LUMBAR LAMINECTOMY/DECOMPRESSION MICRODISCECTOMY 2 LEVELS;  Surgeon: Tobi Bastos, MD;  Location: [facility];  Service: Orthopedics;  Laterality: N/A;  . OVARIAN CYST REMOVAL    . POLYPECTOMY     HPI:  77 y.o. female with past medical history significant for Parkinson disease, advanced dementia, failure to thrive, hypothyroidism, who presented to [facility] ED from home where she lives with her husband due to somnolence with difficulty to arouse of 1 day duration; daughter stated pt has exhibited limited oral intake over the last 2 weeks and is drinking Ensure primarily for intake; CXR on 12/271/9 indicated Stable elevation right hemidiaphragm with right base atelectasis. No edema or  consolidation. Small granuloma left upper lobe. Stable cardiac silhouette; CT head on 06/20/18 negative for intracranial abnormality  Assessment / Plan / Recommendation Clinical Impression   Limited BSE d/t lethargy/refusal; oral care provided prior to intake (although limited d/t refusal) with small amounts of ice chips, thin via spoon/cup, and puree provided with max encouragement/multi-modal cues from SLP/family members with decreased oral propulsion/manipulation, oral holding and delay in the initiation of the swallow noted; no overt s/s of aspiration noted during BSE, but pt is certainly at mild-mod risk d/t decreased mentation/hx of dysphagia related to Parkinson's disease (primarily cognitive-based dysphagia symptoms); ST will continue efforts in acute setting for PO readiness as pt mentation improves. SLP Visit Diagnosis: Dysphagia, unspecified (R13.10)    Aspiration Risk  Moderate aspiration risk;Risk for inadequate nutrition/hydration    Diet Recommendation   NPO; ice chips after oral care allowed  Medication Administration: Via alternative means    Other  Recommendations Oral Care Recommendations: Oral care QID;Oral care prior to ice chip/H20   Follow up Recommendations Other (comment)(TBD)      Frequency and Duration min 2x/week  1 week       Prognosis Prognosis for Safe Diet Advancement: Good      Swallow Study   General Date of Onset: 06/20/18 HPI: 77 y.o. female with past medical history significant for Parkinson disease, advanced dementia, failure to thrive, hypothyroidism, who presented to [facility] ED from home where she lives with her husband due to  somnolence with difficulty to arouse of 1 day duration Type of Study: Bedside Swallow Evaluation Previous Swallow Assessment: (Failed [facility] 06/20/18) Diet Prior to this Study: NPO Temperature Spikes Noted: No Respiratory Status: Room air History of Recent Intubation: No Behavior/Cognition: Lethargic/Drowsy;Requires  cueing Oral Cavity Assessment: Other (comment);Dry(DTA d/t refusal) Oral Care Completed by SLP: Yes(limited) Oral Cavity - Dentition: Adequate natural dentition Self-Feeding Abilities: Needs assist Patient Positioning: Upright in bed Baseline Vocal Quality: Not observed Volitional Cough: Other (Comment)(not observed) Volitional Swallow: Unable to elicit    Oral/Motor/Sensory Function Overall Oral Motor/Sensory Function: Generalized oral weakness Facial ROM: Within Functional Limits Facial Symmetry: Within Functional Limits   Ice Chips Ice chips: Impaired Presentation: Spoon Oral Phase Impairments: Reduced labial seal;Reduced lingual movement/coordination;Poor awareness of bolus Oral Phase Functional Implications: Prolonged oral transit;Oral holding;Left anterior spillage;Right anterior spillage Pharyngeal Phase Impairments: Suspected delayed Swallow   Thin Liquid Thin Liquid: Impaired Presentation: Spoon;Cup Oral Phase Impairments: Poor awareness of bolus;Reduced lingual movement/coordination Oral Phase Functional Implications: Left anterior spillage;Right anterior spillage;Oral holding;Prolonged oral transit Pharyngeal  Phase Impairments: Suspected delayed Swallow    Nectar Thick Nectar Thick Liquid: Not tested   Honey Thick Honey Thick Liquid: Not tested   Puree Puree: Impaired Presentation: Spoon Oral Phase Impairments: Reduced lingual movement/coordination Oral Phase Functional Implications: Right anterior spillage;Left anterior spillage;Oral holding;Left lateral sulci pocketing Pharyngeal Phase Impairments: Suspected delayed Swallow;Other (comments)(removed from oral cavity)   Solid     Solid: Not tested      Elvina Sidle, M.S., CCC-SLP 06/21/2018,11:04 AM

## 2018-06-21 NOTE — Progress Notes (Signed)
PT Cancellation Note  Patient Details Name: [name] MRN: [id number] DOB: Apr 25, 1941   Cancelled Treatment:    Reason Eval/Treat Not Completed: Patient at procedure or test/unavailable(sterile procedure, will follow up as time allows)   Duncan Dull 06/21/2018, 11:27 AM

## 2018-06-21 NOTE — Progress Notes (Addendum)
Right brachial vein assessed and accessed easily. Unable to pass PICC centrally past subclavian area. Body positioned in high fowler and flat without effect. Shoulders noted to be  contracted bilaterally. Recommend central line placement or IR central line placement.

## 2018-06-21 NOTE — Procedures (Signed)
Interventional Radiology Procedure:   Indications: Poor venous access and needs IV fluids and frequent lab draws  Procedure: Placement of left jugular central line.   Findings: Thrombosed right IJ, looks acute or subacute.   Patent left IJ.  Dual lumen Power PICC line cut to 17 cm and tip at SVC/RA junction  Complications: None     EBL: Minimal  Plan: Central line is ready to use.     [name] R. Anselm Pancoast, MD  Pager: [phone]

## 2018-06-21 NOTE — Progress Notes (Signed)
Patient at max number of sticks for her lab draw. Patient has limited access for blood draw.

## 2018-06-21 NOTE — Progress Notes (Signed)
PROGRESS NOTE                                                                                                                                                                                                             Patient Demographics:    [name], is a 77 y.o. female, DOB - [birth year], WLN:[id number]  Admit date - 06/20/2018   Admitting Physician Kayleen Memos, DO  Outpatient Primary MD for the patient is Janith Lima, MD  LOS - 1  Chief Complaint  Patient presents with  . Altered Mental Status       Brief Narrative  [name] is a 77 y.o. female with past medical history significant for Parkinson disease, advanced dementia, failure to thrive, hypothyroidism, who presented to [facility] ED from home where she lives with her husband due to somnolence with difficulty to arouse of 1 day duration, work-up here showed extremely cachectic weak elderly African-American female with severe dehydration and hypernatremia.  CT head and UA unremarkable and she was admitted for further treatment.   Subjective:    [name] today in bed, responsive, unable to follow commands, in no distress.   Assessment  & Plan :     1.  Extreme dehydration and hypernatremia due to poor oral intake which is happening from advanced Parkinson's in a patient with terminal decline.  Is cussed in detail with daughter bedside, she wants to pursue aggressive measures, patient unfortunately has lost IV access hence requested IR to place a PICC line or a central line for access, D5 infusion with close BMP monitoring.  Goal will be to bring sodium to 155 in the next 24 hours from the time of this note, if drops further stop D5.   2.  Advanced Parkinson's.  Supportive care.  Appears to be in terminal decline.  3.  Cachexia, severe protein calorie malnutrition, failure to thrive.  Seen by speech, placed on ice chips, once taking oral diet will add protein supplementation.  This seems to be a  terminal decline process.  4.  Hypothyroidism.  Synthroid switched to IV as TSH is elevated.  Will monitor.    Family Communication  :  daughter  Code Status :  Full  Disposition Plan  :  TBD  Consults  :  None  Procedures  :    CT head - non acute  DVT Prophylaxis  :  Lovenox   Lab Results  Component Value Date   PLT 126 (L) 06/20/2018  Diet :  Diet Order            Diet NPO time specified  Diet effective now               Inpatient Medications Scheduled Meds: . carbidopa-levodopa-entacapone  1 tablet Oral BID  . Cholecalciferol  2,000 Units Oral Daily  . enoxaparin (LOVENOX) injection  30 mg Subcutaneous Q24H  . latanoprost  1 drop Both Eyes QHS  . levothyroxine  25 mcg Intravenous Daily  . levothyroxine  25 mcg Intravenous Daily   Continuous Infusions: . dextrose     PRN Meds:.  Antibiotics  :   Anti-infectives (From admission, onward)   None          Objective:   Vitals:   06/21/18 0149 06/21/18 0210 06/21/18 0348 06/21/18 0430  BP: 119/68  123/82   Pulse:      Resp:      Temp: 98 F (36.7 C) 98 F (36.7 C) 98.5 F (36.9 C) 98.1 F (36.7 C)  TempSrc: Oral Oral Oral Oral  SpO2: 100%  97%   Weight:      Height:        Wt Readings from Last 3 Encounters:  06/20/18 31.8 kg  05/29/18 32.3 kg  02/25/18 36.7 kg     Intake/Output Summary (Last 24 hours) at 06/21/2018 1150 Last data filed at 06/21/2018 0900 Gross per 24 hour  Intake 353.14 ml  Output -  Net 353.14 ml     Physical Exam  Strongly weak, cachectic minimally responsive African-American elderly female lying in hospital bed in no distress Gay.AT,PERRAL Supple Neck,No JVD, No cervical lymphadenopathy appriciated.  Symmetrical Chest wall movement, Good air movement bilaterally, CTAB RRR,No Gallops,Rubs or new Murmurs, No Parasternal Heave +ve B.Sounds, Abd Soft, No tenderness, No organomegaly appriciated, No rebound - guarding or rigidity. No Cyanosis, Clubbing or  edema, No new Rash or bruise      Data Review:    CBC Recent Labs  Lab 06/20/18 1400  WBC 8.7  HGB 16.7*  HCT 56.8*  PLT 126*  MCV 103.8*  MCH 30.5  MCHC 29.4*  RDW 13.3  LYMPHSABS 0.9  MONOABS 0.5  EOSABS 0.0  BASOSABS 0.0    Chemistries  Recent Labs  Lab 06/20/18 1400 06/20/18 2251 06/21/18 0209  NA 168* 166* 167*  K 4.2 3.6 4.0  CL 127* 126* 127*  CO2 25 31 26   GLUCOSE 113* 156* 138*  BUN 49* 43* 44*  CREATININE 1.38* 1.11* 1.04*  CALCIUM 10.2 9.6 9.6  AST 31  --   --   ALT 19  --   --   ALKPHOS 66  --   --   BILITOT 1.9*  --   --    ------------------------------------------------------------------------------------------------------------------ No results for input(s): CHOL, HDL, LDLCALC, TRIG, CHOLHDL, LDLDIRECT in the last 72 hours.  Lab Results  Component Value Date   HGBA1C 5.4 05/29/2018   ------------------------------------------------------------------------------------------------------------------ Recent Labs    06/20/18 2251  TSH 11.390*   ------------------------------------------------------------------------------------------------------------------ No results for input(s): VITAMINB12, FOLATE, FERRITIN, TIBC, IRON, RETICCTPCT in the last 72 hours.  Coagulation profile Recent Labs  Lab 06/20/18 1400  INR 1.19    No results for input(s): DDIMER in the last 72 hours.  Cardiac Enzymes Recent Labs  Lab 06/20/18 1400  TROPONINI 0.05*   ------------------------------------------------------------------------------------------------------------------ No results found for: BNP  Micro Results No results found for this or any previous visit (from the past 240 hour(s)).  Radiology Reports Ct Head  Wo Contrast  Result Date: 06/20/2018 CLINICAL DATA:  Altered level of consciousness, confusion EXAM: CT HEAD WITHOUT CONTRAST TECHNIQUE: Contiguous axial images were obtained from the base of the skull through the vertex without  intravenous contrast. COMPARISON:  CT brain of 05/29/2017 and CT brain of 12/16/2015 FINDINGS: Brain: Ventricular dilatation is unchanged compared to the prior CT as are the prominent cortical sulci, consistent with diffuse atrophy. The septum is in normal midline position. Moderate small vessel ischemic changes again noted throughout the periventricular white matter. No hemorrhage, mass lesion, or acute infarction is seen. Vascular: No vascular abnormality is noted on this unenhanced study. Skull: On bone window images, probable old calcified meningioma is present in the left frontal region and is stable. No acute calvarial abnormality is seen. Sinuses/Orbits: The paranasal sinuses appear well pneumatized. Some extrusion of fat is noted within the right maxillary sinus from the inferior right orbit which was present previously and may be due to prior trauma. Other: None. IMPRESSION: 1. Stable atrophy and moderately severe small vessel ischemic change. No acute intracranial abnormality. 2. Some extrusion of fat is noted from the inferior right lobe into the right maxillary sinus most likely due to prior trauma. This is unchanged compared to the prior CT of 12/16/2015. Electronically Signed   By: Ivar Drape M.D.   On: 06/20/2018 16:13   Ct Head Wo Contrast  Result Date: 05/29/2018 CLINICAL DATA:  Multiple recent falls, the most recent this morning. Unsteady gait. EXAM: CT HEAD WITHOUT CONTRAST TECHNIQUE: Contiguous axial images were obtained from the base of the skull through the vertex without intravenous contrast. COMPARISON:  11/08/2016, 07/23/2016 and 12/16/2015. FINDINGS: Brain: Stable moderate to marked diffuse enlargement of the ventricles and subarachnoid spaces. 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 A small, old, densely calcified left anterior frontal meningioma is unchanged. No intracranial  hemorrhage or CT evidence of acute infarction. Vascular: No hyperdense vessel or unexpected calcification. Skull: Normal. Negative for fracture or focal lesion. 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 No acute abnormality. Other: Small bilateral posterior scalp hematomas or areas of scarring. IMPRESSION: 1. No acute abnormality. 2. Stable moderate to marked diffuse cerebral and cerebellar atrophy. 3. 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 4. Stable small, densely calcified left anterior frontal meningioma. 5. 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 Electronically Signed   By: Claudie Revering M.D.   On: 05/29/2018 15:40   Dg Chest Portable 1 View  Result Date: 06/20/2018 CLINICAL DATA:  Hypertension.  Altered mental status EXAM: PORTABLE CHEST 1 VIEW COMPARISON:  June 17, 2013 FINDINGS: There is elevation of the right hemidiaphragm with mild right base atelectasis. There is a small calcified granuloma in the left upper lobe. There is no edema or consolidation. The heart size and pulmonary vascularity are normal. There is aortic atherosclerosis. No adenopathy. No bone lesions. IMPRESSION: Stable elevation right hemidiaphragm with right base atelectasis. No edema or consolidation. Small granuloma left upper lobe. Stable cardiac silhouette. There is aortic atherosclerosis. Aortic Atherosclerosis (ICD10-I70.0). Electronically Signed   By: Lowella Grip III M.D.   On: 06/20/2018 14:58   [place]  Ekg Site Rite  Result Date: 06/21/2018 If Site Rite image not attached, placement could not be confirmed due to current cardiac rhythm.   Time Spent in  minutes  30   Lala Lund M.D on 06/21/2018 at 11:50 AM  To page go to [email or web address] - password [facility]

## 2018-06-21 NOTE — Consult Note (Signed)
NAME:  [name], MRN:  [id number], DOB:  04-17-1941, LOS: 1 ADMISSION DATE:  06/20/2018, CONSULTATION DATE:  12./28/19 REFERRING MD:  Dr Candiss Norse, CHIEF COMPLAINT:  h   Brief History   See hpi  History of present illness    77 year old lady.  History given by the husband, son at the bedside, nurse practitioner, Dr. Candiss Norse the hospitalist and review of the records  It appears the patient has advanced dementia.  As best as I can gather she has had dementia for a few to several years.  For the last 1 year or so she has been incontinent of bladder and bowel.  And for the last few months has been able to inarticulate more than a few words at a time.  She gets around with a walker up until admission.  There is also failure to thrive but unclear for how long.  Family denies any aspiration episodes at home.  She is become nonverbal, altered mental status and bedbound leading in the few days prior to admission.  At admission found to be severely hypernatremic 167 (new since 05/29/18).  Patient is getting D5 water at 100 cc/h through the peripheral line.  She only has 1 peripheral.  PICC line attempts have failed because the vein is too thin and collapsible easily.  IV technicians are unable to get another IV and unable to do blood draw to monitor hypernatremia correction.  Critical care medicine has been consulted for central line placement.  Patient is extremely frail and bedbound and nonverbal.  According to the hospitalist family is insistent on full code and even considering blood gas test for blood test monitoring.   Dementia severity on chart reveiew  - oct 2019: 2-3 words in response to questions, could not initiate conversations. Stands independently with walker. Normal appetite  Goals of care - OCt 2019/nov 2019 home palliative care - husband said full code but do not hospitalized. Denies dysphagia. Needs bladder and bowel assistance. Eating small portions. No decub  Past Medical History     has a past medical history of Abnormal chest x-ray, Anxiety, Breast cancer ([place]), Diverticulosis of colon, DJD (degenerative joint disease), Glaucoma, Hip pain, Hypercholesteremia, Hypertension, Hypothyroid, Lumbar back pain, Osteopenia, Parkinson disease ([place]), Peripheral vascular disease ([place]), Venous insufficiency, and Vitamin D deficiency.   reports that she has never smoked. She has never used smokeless tobacco.  Past Surgical History:  Procedure Laterality Date  . BREAST LUMPECTOMY  8/11 by DrWakefield  . cataract surgery and argon laser trabeculoplasty  04/2010   Dr. Herbert Deaner  . CHOLECYSTECTOMY    . COLONOSCOPY    . INCISE AND DRAIN ABCESS  10/04/11   sebaceous cyst on back  . lapraoscopic cholecystectomy  09/2003   Dr. Dalbert Batman  . lumbar laminectomy and foraminotomies for spinal stenosis  11/2005   Dr. Gladstone Lighter  . LUMBAR LAMINECTOMY/DECOMPRESSION MICRODISCECTOMY  05/20/2012   Procedure: LUMBAR LAMINECTOMY/DECOMPRESSION MICRODISCECTOMY 2 LEVELS;  Surgeon: Tobi Bastos, MD;  Location: [facility];  Service: Orthopedics;  Laterality: N/A;  . OVARIAN CYST REMOVAL    . POLYPECTOMY      No Known Allergies  Immunization History  Administered Date(s) Administered  . Influenza Split 05/11/2011, 05/02/2012  . Influenza Whole 03/31/2008, 07/06/2009, 04/21/2010  . Influenza, High Dose Seasonal PF 02/28/2017  . Influenza,inj,Quad PF,6+ Mos 03/16/2013  . Influenza,inj,quad, With Preservative 06/26/2015  . Influenza-Unspecified 03/26/2015  . Pneumococcal Conjugate-13 09/13/2014  . Pneumococcal Polysaccharide-23 01/08/2017  . Tdap 09/01/2013, 06/13/2015    Family History  Problem Relation Age of Onset  . Colon cancer Father   . Osteoporosis Neg Hx      Current Facility-Administered Medications:  .  entacapone (COMTAN) tablet 200 mg, 200 mg, Oral, BID **AND** carbidopa-levodopa (SINEMET IR) 25-100 MG per tablet immediate release 1 tablet, 1 tablet, Oral, BID, Candiss Norse, Margaree Mackintosh, MD .   cholecalciferol (VITAMIN D) tablet 2,000 Units, 2,000 Units, Oral, Daily, Candiss Norse, Prashant K, MD .  dextrose 5 % solution, , Intravenous, Continuous, Thurnell Lose, MD, Last Rate: 100 mL/hr at 06/21/18 1200 .  enoxaparin (LOVENOX) injection 30 mg, 30 mg, Subcutaneous, Q24H, Hall, Carole N, DO, 30 mg at 06/21/18 0529 .  latanoprost (XALATAN) 0.005 % ophthalmic solution 1 drop, 1 drop, Both Eyes, QHS, Singh, Prashant K, MD .  levothyroxine (SYNTHROID, LEVOTHROID) injection 25 mcg, 25 mcg, Intravenous, Daily, Thurnell Lose, MD, 25 mcg at 06/21/18 1200  Review of Systems:   Per HPI. Otherwise 11 point ROS negative  Significant Hospital Events   06/20/2018 - admit  Consults:  06/21/18 - ccm consult for line  Procedures:  x  Significant Diagnostic Tests:  x  Micro Data:  x  Antimicrobials:  x   Interim history/subjective:  12/28 - very difficult venous access on US Neck and groin  Objective   Blood pressure 123/82, pulse 86, temperature (!) 97.3 F (36.3 C), temperature source Oral, resp. rate 16, height 5\' 2"  (1.575 m), weight 31.8 kg, SpO2 97 %.        Intake/Output Summary (Last 24 hours) at 06/21/2018 1502 Last data filed at 06/21/2018 0900 Gross per 24 hour  Intake 353.14 ml  Output -  Net 353.14 ml   Filed Weights   06/20/18 1418  Weight: 31.8 kg    Examination: General: very frail, non verbal, deconditioned, cachectic, emacitated HENT: very thin neck, EJ ++ on [place] - the IJ are very frail, thin lumen and barely visible and highly collapsible bialterally Lungs: no distress. CTA bilaterally Cardiovascular: normal heart sound Abdomen: soft, non tender Extremities: emaciated groin. On [place] - femoral veins are very think and collapsible easily Neuro: alert, non verbal GU: intact   LABS    PULMONARY Recent Labs  Lab 06/20/18 1422  PHART 7.419  PCO2ART 42.5  PO2ART 79.0*  HCO3 27.5  TCO2 29  O2SAT 96.0    CBC Recent Labs  Lab 06/20/18 1400    HGB 16.7*  HCT 56.8*  WBC 8.7  PLT 126*    COAGULATION Recent Labs  Lab 06/20/18 1400  INR 1.19    CARDIAC   Recent Labs  Lab 06/20/18 1400  TROPONINI 0.05*   No results for input(s): PROBNP in the last 168 hours.   CHEMISTRY Recent Labs  Lab 06/20/18 1400 06/20/18 2251 06/21/18 0209  NA 168* 166* 167*  K 4.2 3.6 4.0  CL 127* 126* 127*  CO2 25 31 26   GLUCOSE 113* 156* 138*  BUN 49* 43* 44*  CREATININE 1.38* 1.11* 1.04*  CALCIUM 10.2 9.6 9.6   Estimated Creatinine Clearance: 22 mL/min (A) (by C-G formula based on SCr of 1.04 mg/dL (H)).   LIVER Recent Labs  Lab 06/20/18 1400  AST 31  ALT 19  ALKPHOS 66  BILITOT 1.9*  PROT 7.4  ALBUMIN 4.0  INR 1.19     INFECTIOUS No results for input(s): LATICACIDVEN, PROCALCITON in the last 168 hours.   ENDOCRINE CBG (last 3)  No results for input(s): GLUCAP in the last 72 hours.  IMAGING x48h  - image(s) personally visualized  -   highlighted in bold Ct Head Wo Contrast  Result Date: 06/20/2018 CLINICAL DATA:  Altered level of consciousness, confusion EXAM: CT HEAD WITHOUT CONTRAST TECHNIQUE: Contiguous axial images were obtained from the base of the skull through the vertex without intravenous contrast. COMPARISON:  CT brain of 05/29/2017 and CT brain of 12/16/2015 FINDINGS: Brain: Ventricular dilatation is unchanged compared to the prior CT as are the prominent cortical sulci, consistent with diffuse atrophy. The septum is in normal midline position. Moderate small vessel ischemic changes again noted throughout the periventricular white matter. No hemorrhage, mass lesion, or acute infarction is seen. Vascular: No vascular abnormality is noted on this unenhanced study. Skull: On bone window images, probable old calcified meningioma is present in the left frontal region and is stable. No acute calvarial abnormality is seen. Sinuses/Orbits: The paranasal sinuses appear well pneumatized. Some extrusion of  fat is noted within the right maxillary sinus from the inferior right orbit which was present previously and may be due to prior trauma. Other: None. IMPRESSION: 1. Stable atrophy and moderately severe small vessel ischemic change. No acute intracranial abnormality. 2. Some extrusion of fat is noted from the inferior right lobe into the right maxillary sinus most likely due to prior trauma. This is unchanged compared to the prior CT of 12/16/2015. Electronically Signed   By: Ivar Drape M.D.   On: 06/20/2018 16:13   Dg Chest Portable 1 View  Result Date: 06/20/2018 CLINICAL DATA:  Hypertension.  Altered mental status EXAM: PORTABLE CHEST 1 VIEW COMPARISON:  June 17, 2013 FINDINGS: There is elevation of the right hemidiaphragm with mild right base atelectasis. There is a small calcified granuloma in the left upper lobe. There is no edema or consolidation. The heart size and pulmonary vascularity are normal. There is aortic atherosclerosis. No adenopathy. No bone lesions. IMPRESSION: Stable elevation right hemidiaphragm with right base atelectasis. No edema or consolidation. Small granuloma left upper lobe. Stable cardiac silhouette. There is aortic atherosclerosis. Aortic Atherosclerosis (ICD10-I70.0). Electronically Signed   By: Lowella Grip III M.D.   On: 06/20/2018 14:58   [place] Ekg Site Rite  Result Date: 06/21/2018 If Site Rite image not attached, placement could not be confirmed due to current cardiac rhythm.    Resolved Hospital Problem list   x  Assessment & Plan:  Parkinson Advanced Dementia with failure to thrive - Appears to have FAST > 7 severity SEvere hypernatermia with AKI -   - due to above  - accounting for recent decline esp in mental status  PLAN  - CVL by this CCM MD at this point in time - is risky - risk outweighs benefits . Risks include arterial tear and bleeding and stroke, v leg ischemia.  - Recommend Neck EJ by IV team for now and later IR guided CVL - Also,  place panda and start free water -With difficult IV access might have to accept risk for over-correction of high Na  - son  was asking about ABG based BMET testing; this would be a short term option as well but risks of arterial punctures warned  Best practice:  Diet: npo Pain/Anxiety/Delirium protocol (if indicated): per [facility] VAP protocol (if indicated): x DVT prophylaxis: per tRIAD GI prophylaxis: per triad Glucose control: per triad Mobility: bed rest Code Status: full code Family Communication: husband and son updated Disposition: 5w31    SIGNATURE    Dr. Brand Males, M.D., F.C.C.P,  Pulmonary and Critical  Care Medicine Staff Physician, [place] Director - Interstitial Lung Disease  Program  Pulmonary [place] [facility] [place], [place], [address]  Pager: [phone], If no answer or between  15:00h - 7:00h: call [phone] Telephone: [phone]  3:20 PM 06/21/2018

## 2018-06-22 ENCOUNTER — Inpatient Hospital Stay (HOSPITAL_BASED_OUTPATIENT_CLINIC_OR_DEPARTMENT_OTHER): Payer: Medicare Other

## 2018-06-22 ENCOUNTER — Encounter (HOSPITAL_COMMUNITY): Payer: Self-pay | Admitting: Diagnostic Radiology

## 2018-06-22 DIAGNOSIS — R609 Edema, unspecified: Secondary | ICD-10-CM

## 2018-06-22 LAB — CBC
HCT: 43.4 % (ref 36.0–46.0)
HEMOGLOBIN: 13.5 g/dL (ref 12.0–15.0)
MCH: 31.5 pg (ref 26.0–34.0)
MCHC: 31.1 g/dL (ref 30.0–36.0)
MCV: 101.2 fL — ABNORMAL HIGH (ref 80.0–100.0)
Platelets: 124 10*3/uL — ABNORMAL LOW (ref 150–400)
RBC: 4.29 MIL/uL (ref 3.87–5.11)
RDW: 13.3 % (ref 11.5–15.5)
WBC: 11.6 10*3/uL — ABNORMAL HIGH (ref 4.0–10.5)
nRBC: 0 % (ref 0.0–0.2)

## 2018-06-22 LAB — BASIC METABOLIC PANEL
ANION GAP: 8 (ref 5–15)
ANION GAP: 7 (ref 5–15)
Anion gap: 7 (ref 5–15)
BUN: 26 mg/dL — ABNORMAL HIGH (ref 8–23)
BUN: 24 mg/dL — ABNORMAL HIGH (ref 8–23)
BUN: 21 mg/dL (ref 8–23)
CO2: 30 mmol/L (ref 22–32)
CO2: 29 mmol/L (ref 22–32)
CO2: 26 mmol/L (ref 22–32)
Calcium: 9 mg/dL (ref 8.9–10.3)
Calcium: 8.8 mg/dL — ABNORMAL LOW (ref 8.9–10.3)
Calcium: 8.7 mg/dL — ABNORMAL LOW (ref 8.9–10.3)
Chloride: 123 mmol/L — ABNORMAL HIGH (ref 98–111)
Chloride: 120 mmol/L — ABNORMAL HIGH (ref 98–111)
Chloride: 116 mmol/L — ABNORMAL HIGH (ref 98–111)
Creatinine, Ser: 0.9 mg/dL (ref 0.44–1.00)
Creatinine, Ser: 0.75 mg/dL (ref 0.44–1.00)
Creatinine, Ser: 0.73 mg/dL (ref 0.44–1.00)
GFR calc Af Amer: >60 mL/min (ref >60)
GFR calc Af Amer: >60 mL/min (ref >60)
GFR calc Af Amer: >60 mL/min (ref >60)
GFR calc non Af Amer: >60 mL/min (ref >60)
GFR calc non Af Amer: >60 mL/min (ref >60)
GFR calc non Af Amer: >60 mL/min (ref >60)
GLUCOSE: 178 mg/dL — AB (ref 70–99)
GLUCOSE: 184 mg/dL — AB (ref 70–99)
Glucose, Bld: 203 mg/dL — ABNORMAL HIGH (ref 70–99)
Potassium: 3.2 mmol/L — ABNORMAL LOW (ref 3.5–5.1)
Potassium: 3.1 mmol/L — ABNORMAL LOW (ref 3.5–5.1)
Potassium: 4 mmol/L (ref 3.5–5.1)
Sodium: 160 mmol/L — ABNORMAL HIGH (ref 135–145)
Sodium: 157 mmol/L — ABNORMAL HIGH (ref 135–145)
Sodium: 149 mmol/L — ABNORMAL HIGH (ref 135–145)

## 2018-06-22 LAB — MAGNESIUM: MAGNESIUM: 2 mg/dL (ref 1.7–2.4)

## 2018-06-22 MED ORDER — LACTATED RINGERS IV SOLN
INTRAVENOUS | Status: DC
  Administered 2018-06-22: 23:00:00 via INTRAVENOUS

## 2018-06-22 MED ORDER — POTASSIUM CHLORIDE 10 MEQ/100ML IV SOLN
10.0000 meq | INTRAVENOUS | Status: AC
  Administered 2018-06-22 (×4): 10 meq via INTRAVENOUS
  Filled 2018-06-22 (×3): qty 100

## 2018-06-22 MED ORDER — ASPIRIN 300 MG RE SUPP
300.0000 mg | Freq: Every day | RECTAL | Status: DC
  Administered 2018-06-22 – 2018-06-24 (×3): 300 mg via RECTAL
  Filled 2018-06-22 (×3): qty 1

## 2018-06-22 MED ORDER — DEXTROSE 5 % IV SOLN
INTRAVENOUS | Status: DC
  Administered 2018-06-22: 14:00:00 via INTRAVENOUS

## 2018-06-22 NOTE — Progress Notes (Signed)
Bilateral carotid duplex exam completed. Bilateral upper extremities venous duplex exam completed. More details please see preliminary notes on CV PROC under chart review. Very limited access due to patient condition.  Notice: Positive Acute Deep veins thrombosis involving right IJV, Subclavian and Axillary veins.  Wilver Tignor H Zauria Dombek(RDMS RVT) 06/22/18 1:09 PM

## 2018-06-22 NOTE — Progress Notes (Signed)
PT Cancellation Note  Patient Details Name: [name] MRN: [id number] DOB: 1940-11-01   Cancelled Treatment:    Reason Eval/Treat Not Completed: Medical issues which prohibited therapy per chart pt with Right  IJ thrombus,  ultrasound pending for BUE DVT. Will hold at this time and cont to follow.   Reinaldo Berber, PT, DPT Acute Rehabilitation Services Pager: [phone] Office: [phone]    Reinaldo Berber 06/22/2018, 11:03 AM

## 2018-06-22 NOTE — Progress Notes (Signed)
  Speech Language Pathology Treatment: Dysphagia  Patient Details Name: [name] MRN: [id number] DOB: May 24, 1941 Today's Date: 06/22/2018 Time: 5465-6812 SLP Time Calculation (min) (ACUTE ONLY): 12 min  Assessment / Plan / Recommendation Clinical Impression  Pt continues to present with poor arousability. With repositioning to maximize safety, tactile prompts to awaken, pt able to awaken to accept limited ice chips, sips of water.  She maintained eyes closed throughout.  Family was not present. She demonstrated no effort to manipulate pudding, which had to be manually removed from her mouth.  Water elicited weak, wet cough and audible congestion in pharynx.  Pt is not sufficiently alert in order to eat. There is minimal purposeful effort to consume any POs, but reflexive swallowing is present. Per notes, family wants to treat aggressively despite severe and chronic cachexia, protein malnutrition, advanced Parkinson's.  Recommend continued vigilant oral care, ice chips, but otherwise NPO until MS improves to baseline, which is likely only a marginal difference.     HPI HPI: 77 y.o. female with past medical history significant for Parkinson disease, advanced dementia, failure to thrive, hypothyroidism, who presented [facility] ED from home where she lives with her husband due to somnolence with difficulty to arouse of 1 day duration; daughter stated pt has exhibited limited oral intake over the last 2 weeks and is drinking Ensure primarily for intake; CXR on 12/271/9 indicated Stable elevation right hemidiaphragm with right base atelectasis. No edema or consolidation. Small granuloma left upper lobe. Stable cardiac silhouette; CT head on 06/20/18 negative for intracranial abnormality      SLP Plan  Continue with current plan of care       Recommendations  Diet recommendations: NPO(except ice chips) Medication Administration: Via alternative means                Oral Care Recommendations:  Oral care QID;Oral care prior to ice chip/H20 Follow up Recommendations: 24 hour supervision/assistance SLP Visit Diagnosis: Dysphagia, unspecified (R13.10) Plan: Continue with current plan of care       GO                Juan Quam Laurice 06/22/2018, 10:23 AM  Estill Bamberg L. Tivis Ringer, [place] Office number [phone] Pager [phone]

## 2018-06-22 NOTE — Progress Notes (Signed)
PROGRESS NOTE                                                                                                                                                                                                             Patient Demographics:    [name], is a 77 y.o. female, DOB - Nov 19, 1940, JKD:[id number]  Admit date - 06/20/2018   Admitting Physician Kayleen Memos, DO  Outpatient Primary MD for the patient is Janith Lima, MD  LOS - 2  Chief Complaint  Patient presents with  . Altered Mental Status       Brief Narrative  [name] is a 77 y.o. female with past medical history significant for Parkinson disease, advanced dementia, failure to thrive, hypothyroidism, who presented to [facility] ED from home where she lives with her husband due to somnolence with difficulty to arouse of 1 day duration, work-up here showed extremely cachectic weak elderly African-American female with severe dehydration and hypernatremia.  CT head and UA unremarkable and she was admitted for further treatment.   Subjective:    Edwinna Rochette today in bed, extremely weak and cachectic, in no discomfort but cannot answer questions or follow commands at this time   Assessment  & Plan :     1.  Extreme dehydration and hypernatremia due to poor oral intake which is happening from advanced Parkinson's in a patient with terminal decline.  Discussed with daughter and husband in detail, they unfortunately have unrealistic expectations, they want to pursue aggressive measures and want to keep the patient full code.  To me patient appears to be in terminal decline with severe cachexia, dehydration, protein calorie malnutrition.  She likely has been in this decline for the last several months if not years, she weighs 44 kg's.  At this time for dehydration and hypernatremia D5W will be continued, we had issues with IV access and after much effort IR graciously placed a left IJ central line.  Continue  monitoring BMP closely goal will be to drop her sodium to around 150 in the next 24 hours from the time of filing this note if sodium drops below 150 stop D5 drip and switch to LR at 75 cc an hour.  Will also involve palliative care for goals of care as she is virtually n.p.o. and looks like has had decreased oral intake for several months and is in terminal decline.   2.  Advanced Parkinson's.  Supportive care.  Appears to be in terminal decline.  3.  Cachexia, severe protein calorie malnutrition, failure to thrive.  Seen by speech, placed on ice chips, once taking oral diet will add protein supplementation.  This seems to be a terminal decline process.  4.  Hypothyroidism.  Synthroid switched to IV as TSH is elevated.  Will monitor.  5.  Right IJ thrombosis incidentally found by IR during line placement -patient acute versus subacute, formal carotid duplex ultrasound and bilateral upper extremity venous duplex ordered.  For now aspirin suppository.  Very high risk for bleeding due to severe cachexia with anticoagulation.  If DVTs are proven then will have to place her on low-dose Lovenox.  Family understands the risks and benefits.    Family Communication  :  daughter  Code Status :  Full  Disposition Plan  :  TBD  Consults  :  None  Procedures  :    CT head - non acute  DVT Prophylaxis  :  Lovenox   Lab Results  Component Value Date   PLT 124 (L) 06/22/2018    Diet :  Diet Order            Diet NPO time specified  Diet effective now               Inpatient Medications Scheduled Meds: . aspirin  300 mg Rectal Daily  . entacapone  200 mg Oral BID   And  . carbidopa-levodopa  1 tablet Oral BID  . enoxaparin (LOVENOX) injection  30 mg Subcutaneous Q24H  . latanoprost  1 drop Both Eyes QHS  . levothyroxine  25 mcg Intravenous Daily   Continuous Infusions: . dextrose 100 mL/hr at 06/22/18 0805  . potassium chloride     PRN Meds:.  Antibiotics  :     Anti-infectives (From admission, onward)   None          Objective:   Vitals:   06/21/18 1238 06/21/18 2009 06/21/18 2045 06/22/18 0453  BP:  119/71    Pulse:      Resp:      Temp: (!) 97.3 F (36.3 C) 98.7 F (37.1 C) 98.3 F (36.8 C)   TempSrc: Oral Oral Oral   SpO2:  96%    Weight:   29.3 kg 29.9 kg  Height:        Wt Readings from Last 3 Encounters:  06/22/18 29.9 kg  05/29/18 32.3 kg  02/25/18 36.7 kg     Intake/Output Summary (Last 24 hours) at 06/22/2018 0956 Last data filed at 06/22/2018 0600 Gross per 24 hour  Intake -  Output 250 ml  Net -250 ml     Physical Exam  Extremely frail, elderly, cachectic African-American female, left IJ central line in place [place].AT,PERRAL Supple Neck,No JVD, No cervical lymphadenopathy appriciated.  Symmetrical Chest wall movement, Good air movement bilaterally, CTAB RRR,No Gallops, Rubs or new Murmurs, No Parasternal Heave +ve B.Sounds, Abd Soft, No tenderness, No organomegaly appriciated, No rebound - guarding or rigidity. No Cyanosis, Clubbing or edema, No new Rash or bruise     Data Review:    CBC Recent Labs  Lab 06/20/18 1400 06/22/18 0346  WBC 8.7 11.6*  HGB 16.7* 13.5  HCT 56.8* 43.4  PLT 126* 124*  MCV 103.8* 101.2*  MCH 30.5 31.5  MCHC 29.4* 31.1  RDW 13.3 13.3  LYMPHSABS 0.9  --   MONOABS 0.5  --   EOSABS 0.0  --   BASOSABS 0.0  --     Chemistries  Recent  Labs  Lab 06/20/18 1400 06/20/18 2251 06/21/18 0209 06/21/18 1822 06/22/18 0346  NA 168* 166* 167* 159* 160*  K 4.2 3.6 4.0 3.3* 3.2*  CL 127* 126* 127* 123* 123*  CO2 25 31 26 28 30   GLUCOSE 113* 156* 138* 236* 178*  BUN 49* 43* 44* 30* 26*  CREATININE 1.38* 1.11* 1.04* 0.77 0.90  CALCIUM 10.2 9.6 9.6 9.1 9.0  MG  --   --   --  2.1 2.0  AST 31  --   --   --   --   ALT 19  --   --   --   --   ALKPHOS 66  --   --   --   --   BILITOT 1.9*  --   --   --   --     ------------------------------------------------------------------------------------------------------------------ No results for input(s): CHOL, HDL, LDLCALC, TRIG, CHOLHDL, LDLDIRECT in the last 72 hours.  Lab Results  Component Value Date   HGBA1C 5.4 05/29/2018   ------------------------------------------------------------------------------------------------------------------ Recent Labs    06/20/18 2251  TSH 11.390*   ------------------------------------------------------------------------------------------------------------------ No results for input(s): VITAMINB12, FOLATE, FERRITIN, TIBC, IRON, RETICCTPCT in the last 72 hours.  Coagulation profile Recent Labs  Lab 06/20/18 1400 06/21/18 1822  INR 1.19 1.42    No results for input(s): DDIMER in the last 72 hours.  Cardiac Enzymes Recent Labs  Lab 06/20/18 1400  TROPONINI 0.05*   ------------------------------------------------------------------------------------------------------------------ No results found for: BNP  Micro Results No results found for this or any previous visit (from the past 240 hour(s)).  Radiology Reports Ct Head Wo Contrast  Result Date: 06/20/2018 CLINICAL DATA:  Altered level of consciousness, confusion EXAM: CT HEAD WITHOUT CONTRAST TECHNIQUE: Contiguous axial images were obtained from the base of the skull through the vertex without intravenous contrast. COMPARISON:  CT brain of 05/29/2017 and CT brain of 12/16/2015 FINDINGS: Brain: Ventricular dilatation is unchanged compared to the prior CT as are the prominent cortical sulci, consistent with diffuse atrophy. The septum is in normal midline position. Moderate small vessel ischemic changes again noted throughout the periventricular white matter. No hemorrhage, mass lesion, or acute infarction is seen. Vascular: No vascular abnormality is noted on this unenhanced study. Skull: On bone window images, probable old calcified meningioma is  present in the left frontal region and is stable. No acute calvarial abnormality is seen. Sinuses/Orbits: The paranasal sinuses appear well pneumatized. Some extrusion of fat is noted within the right maxillary sinus from the inferior right orbit which was present previously and may be due to prior trauma. Other: None. IMPRESSION: 1. Stable atrophy and moderately severe small vessel ischemic change. No acute intracranial abnormality. 2. Some extrusion of fat is noted from the inferior right lobe into the right maxillary sinus most likely due to prior trauma. This is unchanged compared to the prior CT of 12/16/2015. Electronically Signed   By: Ivar Drape M.D.   On: 06/20/2018 16:13   Ct Head Wo Contrast  Result Date: 05/29/2018 CLINICAL DATA:  Multiple recent falls, the most recent this morning. Unsteady gait. EXAM: CT HEAD WITHOUT CONTRAST TECHNIQUE: Contiguous axial images were obtained from the base of the skull through the vertex without intravenous contrast. COMPARISON:  11/08/2016, 07/23/2016 and 12/16/2015. FINDINGS: Brain: Stable moderate to marked diffuse enlargement of the ventricles and subarachnoid spaces. 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 A small, old, densely calcified left anterior  frontal meningioma is unchanged. No intracranial hemorrhage or CT evidence of acute infarction. Vascular: No hyperdense vessel or unexpected calcification. Skull: Normal. Negative for fracture or focal lesion. 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 No acute abnormality. Other: Small bilateral posterior scalp hematomas or areas of scarring. IMPRESSION: 1. No acute abnormality. 2. Stable moderate to marked diffuse cerebral and cerebellar atrophy. 3. 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 4. Stable small, densely calcified left anterior frontal meningioma. 5. 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 Electronically Signed   By: Claudie Revering M.D.   On: 05/29/2018 15:40   Ir Fluoro Guide Cv Line Left  Result Date: 06/22/2018 INDICATION: 77 year old with hypernatremia and poor venous access. PICC line team was unable to place central line. EXAM: FLUOROSCOPIC AND ULTRASOUND GUIDED PLACEMENT OF A CENTRAL VENOUS CATHETER Physician: Stephan Minister. Henn, MD FLUOROSCOPY TIME:  2 minutes and 54 seconds MEDICATIONS: None ANESTHESIA/SEDATION: None PROCEDURE: Informed consent was obtained for a central venous catheter by the family. Patient has advanced dementia and unable to give consent. The patient was placed supine on the interventional table. Ultrasound confirmed a patent left internal jugularvein. Ultrasound images were obtained for documentation. The left neck was prepped and draped in a sterile fashion. The left neck neck was anesthetized with 1% lidocaine. Maximal barrier sterile technique was utilized including caps, mask, sterile gowns, sterile gloves, sterile drape, hand hygiene and skin antiseptic. A 21 gauge needle directed into the left internal jugular vein with ultrasound guidance. A micropuncture dilator set was placed. It was difficult to advance a wire centrally. Eventually, a 0.018 wire was advanced centrally and a peel-away sheath was placed. A dual lumen Power PICC line was cut to 17 cm and advanced over the wire in order to keep central venous access. Catheter tip was placed at the SVC and right atrium junction. Both lumens aspirated and flushed well. Both lumens were capped. The catheter was sutured to skin. Fluoroscopic and ultrasound images were taken and  saved for documentation. FINDINGS: Right internal jugular vein is enlarged and contains thrombus. Patent left internal jugular vein. Catheter tip at the SVC and right atrium junction. Chronic elevation of the right hemidiaphragm. COMPLICATIONS: None IMPRESSION: Successful placement of left internal jugular central venous catheter. Catheter tip at the SVC and right atrium junction and ready to use. Thrombus in the right internal jugular vein. Right internal jugular vein is enlarged and this thrombus appears to be acute or subacute. Findings discussed with Dr. Candiss Norse immediately following placement of the central venous catheter. Electronically Signed   By: Markus Daft M.D.   On: 06/22/2018 09:19   Ir US Guide Vasc Access Left  Result Date: 06/22/2018 INDICATION: 77 year old with hypernatremia and poor venous access. PICC line team was unable to place central line. EXAM: FLUOROSCOPIC AND ULTRASOUND GUIDED PLACEMENT OF A CENTRAL VENOUS CATHETER Physician: Stephan Minister. Henn, MD FLUOROSCOPY TIME:  2 minutes and 54 seconds MEDICATIONS: None ANESTHESIA/SEDATION: None PROCEDURE: Informed consent was obtained for a central venous catheter by the family. Patient has advanced dementia and unable to give consent. The patient was placed supine on the interventional table. Ultrasound confirmed a patent left internal jugularvein. Ultrasound images were obtained for documentation.  The left neck was prepped and draped in a sterile fashion. The left neck neck was anesthetized with 1% lidocaine. Maximal barrier sterile technique was utilized including caps, mask, sterile gowns, sterile gloves, sterile drape, hand hygiene and skin antiseptic. A 21 gauge needle directed into the left internal jugular vein with ultrasound guidance. A micropuncture dilator set was placed. It was difficult to advance a wire centrally. Eventually, a 0.018 wire was advanced centrally and a peel-away sheath was placed. A dual lumen Power PICC line was cut to 17  cm and advanced over the wire in order to keep central venous access. Catheter tip was placed at the SVC and right atrium junction. Both lumens aspirated and flushed well. Both lumens were capped. The catheter was sutured to skin. Fluoroscopic and ultrasound images were taken and saved for documentation. FINDINGS: Right internal jugular vein is enlarged and contains thrombus. Patent left internal jugular vein. Catheter tip at the SVC and right atrium junction. Chronic elevation of the right hemidiaphragm. COMPLICATIONS: None IMPRESSION: Successful placement of left internal jugular central venous catheter. Catheter tip at the SVC and right atrium junction and ready to use. Thrombus in the right internal jugular vein. Right internal jugular vein is enlarged and this thrombus appears to be acute or subacute. Findings discussed with Dr. Candiss Norse immediately following placement of the central venous catheter. Electronically Signed   By: Markus Daft M.D.   On: 06/22/2018 09:19   Dg Chest Portable 1 View  Result Date: 06/20/2018 CLINICAL DATA:  Hypertension.  Altered mental status EXAM: PORTABLE CHEST 1 VIEW COMPARISON:  June 17, 2013 FINDINGS: There is elevation of the right hemidiaphragm with mild right base atelectasis. There is a small calcified granuloma in the left upper lobe. There is no edema or consolidation. The heart size and pulmonary vascularity are normal. There is aortic atherosclerosis. No adenopathy. No bone lesions. IMPRESSION: Stable elevation right hemidiaphragm with right base atelectasis. No edema or consolidation. Small granuloma left upper lobe. Stable cardiac silhouette. There is aortic atherosclerosis. Aortic Atherosclerosis (ICD10-I70.0). Electronically Signed   By: Lowella Grip III M.D.   On: 06/20/2018 14:58   [place] Ekg Site Rite  Result Date: 06/21/2018 If Site Rite image not attached, placement could not be confirmed due to current cardiac rhythm.   Time Spent in minutes   30   Lala Lund M.D on 06/22/2018 at 9:56 AM  To page go to [email or web address] - password [email or web address]

## 2018-06-23 ENCOUNTER — Inpatient Hospital Stay (HOSPITAL_COMMUNITY): Payer: Medicare Other

## 2018-06-23 DIAGNOSIS — Z515 Encounter for palliative care: Secondary | ICD-10-CM

## 2018-06-23 DIAGNOSIS — I82A21 Chronic embolism and thrombosis of right axillary vein: Secondary | ICD-10-CM

## 2018-06-23 DIAGNOSIS — Z7189 Other specified counseling: Secondary | ICD-10-CM

## 2018-06-23 DIAGNOSIS — R4182 Altered mental status, unspecified: Secondary | ICD-10-CM

## 2018-06-23 DIAGNOSIS — E870 Hyperosmolality and hypernatremia: Secondary | ICD-10-CM

## 2018-06-23 LAB — CBC
HEMATOCRIT: 39.8 % (ref 36.0–46.0)
HEMOGLOBIN: 12.3 g/dL (ref 12.0–15.0)
MCH: 30.7 pg (ref 26.0–34.0)
MCHC: 30.9 g/dL (ref 30.0–36.0)
MCV: 99.3 fL (ref 80.0–100.0)
Platelets: 107 10*3/uL — ABNORMAL LOW (ref 150–400)
RBC: 4.01 MIL/uL (ref 3.87–5.11)
RDW: 13.2 % (ref 11.5–15.5)
WBC: 8.6 10*3/uL (ref 4.0–10.5)
nRBC: 0 % (ref 0.0–0.2)

## 2018-06-23 LAB — BASIC METABOLIC PANEL
Anion gap: 9 (ref 5–15)
Anion gap: 9 (ref 5–15)
Anion gap: 7 (ref 5–15)
BUN: 19 mg/dL (ref 8–23)
BUN: 19 mg/dL (ref 8–23)
BUN: 16 mg/dL (ref 8–23)
CO2: 26 mmol/L (ref 22–32)
CO2: 25 mmol/L (ref 22–32)
CO2: 24 mmol/L (ref 22–32)
Calcium: 8.5 mg/dL — ABNORMAL LOW (ref 8.9–10.3)
Calcium: 8.6 mg/dL — ABNORMAL LOW (ref 8.9–10.3)
Calcium: 8.2 mg/dL — ABNORMAL LOW (ref 8.9–10.3)
Chloride: 116 mmol/L — ABNORMAL HIGH (ref 98–111)
Chloride: 118 mmol/L — ABNORMAL HIGH (ref 98–111)
Chloride: 116 mmol/L — ABNORMAL HIGH (ref 98–111)
Creatinine, Ser: 0.9 mg/dL (ref 0.44–1.00)
Creatinine, Ser: 0.76 mg/dL (ref 0.44–1.00)
Creatinine, Ser: 0.72 mg/dL (ref 0.44–1.00)
GFR calc Af Amer: >60 mL/min (ref >60)
GFR calc Af Amer: >60 mL/min (ref >60)
GFR calc non Af Amer: >60 mL/min (ref >60)
GFR calc non Af Amer: >60 mL/min (ref >60)
Glucose, Bld: 87 mg/dL (ref 70–99)
Glucose, Bld: 97 mg/dL (ref 70–99)
Glucose, Bld: 100 mg/dL — ABNORMAL HIGH (ref 70–99)
Potassium: 3.4 mmol/L — ABNORMAL LOW (ref 3.5–5.1)
Potassium: 3.5 mmol/L (ref 3.5–5.1)
Potassium: 4.2 mmol/L (ref 3.5–5.1)
SODIUM: 147 mmol/L — AB (ref 135–145)
Sodium: 151 mmol/L — ABNORMAL HIGH (ref 135–145)
Sodium: 152 mmol/L — ABNORMAL HIGH (ref 135–145)

## 2018-06-23 MED ORDER — POTASSIUM CHLORIDE 10 MEQ/100ML IV SOLN
10.0000 meq | INTRAVENOUS | Status: AC
  Administered 2018-06-23 (×4): 10 meq via INTRAVENOUS
  Filled 2018-06-23 (×4): qty 100

## 2018-06-23 MED ORDER — SODIUM CHLORIDE 0.9 % IV BOLUS
250.0000 mL | Freq: Once | INTRAVENOUS | Status: AC
  Administered 2018-06-23: 250 mL via INTRAVENOUS

## 2018-06-23 MED ORDER — DEXTROSE 5 % IV SOLN
INTRAVENOUS | Status: AC
  Administered 2018-06-23 (×2): via INTRAVENOUS

## 2018-06-23 NOTE — Care Management Important Message (Signed)
Important Message  Patient Details  Name: [name] MRN: [id number] Date of Birth: [birth year]   Medicare Important Message Given:  Yes    [name] 06/23/2018, 3:50 PM

## 2018-06-23 NOTE — Consult Note (Signed)
Consultation Note Date: 06/23/2018   Patient Name: [name]  DOB: 1940/07/26  MRN: [id number]  Age / Sex: 77 y.o., female  PCP: Janith Lima, MD Referring Physician: Thurnell Lose, MD  Reason for Consultation: Establishing goals of care  HPI/Patient Profile: 77 y.o. female  with past medical history of Parkinson's disease, hypothyroidism, HTN  admitted on 06/20/2018 with altered mental status- she was somnolent, aroused only to familiar voices. Workup revealed severe hypernatremia, AKI due to severe dehydration, failure to thrive in patient with advancing Parkinson's disease. Palliative medicine consulted for [place].  Clinical Assessment and Goals of Care: I evaluated the patient and reviewed the chart.  Patient in bed appears cachetic. Her eyes are open but she does not track to my voice or follow commands.  Per chart review- when outpatient Palliative provider visited her in the home on 11/22 patient was able to sit in a chair independently and answer direct questions with 1-2 words. PO intake had been decreasing and patient had a soft voice. She has a CNA at home. There was no family at bedside. I called and spoke via telephone with patient's daughter Malachy Mood. Introduced Palliative medicine- she is familiar with Palliative medicine- patient is [facility] at home. Malachy Mood is an Therapist, sports and also cares for her Dad and Mom in the home. Meeting arranged for tomorrow. Per Palliative notes- patient has remained full code.  Primary Decision Maker NEXT OF KIN- spouse- [name] and daughter- Vincenta Steffey    SUMMARY OF RECOMMENDATIONS -Continue current scope of care -[place] discussion arranged for tomorrow at 11am with patient's daughter- Malachy Mood and spouse- [place] Planning:  Full code  Palliative Prophylaxis:   Delirium  Protocol  Additional Recommendations (Limitations, Scope, Preferences):  Full Scope Treatment  Prognosis:    Unable to determine  Discharge Planning: To Be Determined  Primary Diagnoses: Present on Admission: . Dehydration with hypernatremia   I have reviewed the medical record, interviewed the patient and family, and examined the patient. The following aspects are pertinent.  Past Medical History:  Diagnosis Date  . Abnormal chest x-ray   . Anxiety   . Breast cancer ([place])    stage 0 left  . Diverticulosis of colon   . DJD (degenerative joint disease)   . Glaucoma   . Hip pain   . Hypercholesteremia   . Hypertension   . Hypothyroid   . Lumbar back pain   . Osteopenia   . Parkinson disease ([place])   . Peripheral vascular disease ([place])   . Venous insufficiency   . Vitamin D deficiency    Social History   Socioeconomic History  . Marital status: Married    Spouse name: [name]  . Number of children: 2  . Years of education: Assoc  . Highest education level: Not on file  Occupational History  . Occupation: retired    Fish farm manager: RETIRED  Social Needs  . Financial resource strain: Not on file  . Food  insecurity:    Worry: Not on file    Inability: Not on file  . Transportation needs:    Medical: Not on file    Non-medical: Not on file  Tobacco Use  . Smoking status: Never Smoker  . Smokeless tobacco: Never Used  Substance and Sexual Activity  . Alcohol use: No  . Drug use: No  . Sexual activity: Not Currently  Lifestyle  . Physical activity:    Days per week: Not on file    Minutes per session: Not on file  . Stress: Not on file  Relationships  . Social connections:    Talks on phone: Not on file    Gets together: Not on file    Attends religious service: Not on file    Active member of club or organization: Not on file    Attends meetings of clubs or organizations: Not on file    Relationship status: Not on file  Other Topics Concern  . Not on file   Social History Narrative   Pt lives at home with spouse. 12/19/16 aid every day for 4 hours   Caffeine Use: Very little; once a week   Family History  Problem Relation Age of Onset  . Colon cancer Father   . Osteoporosis Neg Hx    Scheduled Meds: . aspirin  300 mg Rectal Daily  . entacapone  200 mg Oral BID   And  . carbidopa-levodopa  1 tablet Oral BID  . enoxaparin (LOVENOX) injection  30 mg Subcutaneous Q24H  . latanoprost  1 drop Both Eyes QHS  . levothyroxine  25 mcg Intravenous Daily   Continuous Infusions: . dextrose 75 mL/hr at 06/23/18 0809  . potassium chloride 10 mEq (06/23/18 1419)   PRN Meds:.sodium chloride flush Medications Prior to Admission:  Prior to Admission medications   Medication Sig Start Date End Date Taking? Authorizing Provider  carbidopa-levodopa-entacapone (STALEVO) 25-100-200 MG tablet Take 1 tablet by mouth 3 (three) times daily. Patient taking differently: Take 1 tablet by mouth 2 (two) times daily.  02/25/18  Yes Penumalli, Earlean Polka, MD  Cholecalciferol ([employer] D3) 2000 units CAPS Take 1 capsule (2,000 Units total) by mouth daily. 08/26/17  Yes Janith Lima, MD  levothyroxine (SYNTHROID, LEVOTHROID) 50 MCG tablet Take 1 tablet (50 mcg total) by mouth daily before breakfast. 04/08/17  Yes Janith Lima, MD  Brinzolamide-Brimonidine [facility]) 1-0.2 % SUSP Place 1 drop into both eyes 2 (two) times daily.    [provider]  latanoprost (XALATAN) 0.005 % ophthalmic solution Place 1 drop into both eyes at bedtime.  05/10/08   [provider]   No Known Allergies Review of Systems  Unable to perform ROS: Dementia    Physical Exam Vitals signs and nursing note reviewed.  Constitutional:      Appearance: She is ill-appearing.     Comments: cachexic  Musculoskeletal:     Comments: Diffuse muscle wasting  Neurological:     Comments: nonverbal     Vital Signs: BP (!) 81/57 (BP Location: Left Arm) Comment: rn  notified  Pulse  80   Temp 99.2 F (37.3 C) (Oral)   Resp 18   Ht 5\' 2"  (1.575 m)   Wt 29.9 kg   SpO2 (!) 89% Comment: rn notified  BMI 12.06 kg/m  Pain Scale: 0-10   Pain Score: Asleep   SpO2: SpO2: (!) 89 %(rn notified) O2 Device:SpO2: (!) 89 %(rn notified) O2 Flow Rate: .O2 Flow Rate (L/min): 2  L/min  IO: Intake/output summary:   Intake/Output Summary (Last 24 hours) at 06/23/2018 1510 Last data filed at 06/22/2018 1823 Gross per 24 hour  Intake 608.44 ml  Output -  Net 608.44 ml    LBM:   Baseline Weight: Weight: 31.8 kg Most recent weight: Weight: 29.9 kg     Palliative Assessment/Data: PPS: 20%   Flowsheet Rows     Most Recent Value  Intake Tab  Referral Department  Hospitalist  Unit at Time of Referral  Intermediate Care Unit  Palliative Care Primary Diagnosis  Other (Comment) [ftt]  Date Notified  06/22/18  Palliative Care Type  New Palliative care  Reason for referral  Clarify Goals of Care  Date of Admission  06/20/18  # of days IP prior to Palliative referral  2  Clinical Assessment  Psychosocial & Spiritual Assessment  Palliative Care Outcomes      Thank you for this consult. Palliative medicine will continue to follow and assist as needed.   Time In: 1400 Time Out: 1510 Time Total: 70 minutes Greater than 50%  of this time was spent counseling and coordinating care related to the above assessment and plan.  Signed by: Mariana Kaufman, AGNP-C Palliative Medicine    Please contact Palliative Medicine Team phone at [phone] for questions and concerns.  For individual provider: See Shea Evans

## 2018-06-23 NOTE — Progress Notes (Signed)
ANTICOAGULATION CONSULT NOTE - Initial Consult  Pharmacy Consult for Lovenox Indication: DVT  No Known Allergies  Patient Measurements: Height: 5\' 2"  (157.5 cm) Weight: 65 lb 14.7 oz (29.9 kg) IBW/kg (Calculated) : 50.1  Vital Signs: Temp: 99.6 F (37.6 C) (12/30 0448) Temp Source: Oral (12/30 0448) BP: 104/69 (12/30 0448) Pulse Rate: 73 (12/30 0448)  Labs: Recent Labs    06/20/18 1400  06/21/18 1822 06/22/18 0346 06/22/18 1021 06/22/18 1815 06/23/18 0400  HGB 16.7*  --   --  13.5  --   --  12.3  HCT 56.8*  --   --  43.4  --   --  39.8  PLT 126*  --   --  124*  --   --  107*  APTT 20*  --   --   --   --   --   --   LABPROT 15.0  --  17.2*  --   --   --   --   INR 1.19  --  1.42  --   --   --   --   CREATININE 1.38*   < > 0.77 0.90 0.75 0.73 0.90  TROPONINI 0.05*  --   --   --   --   --   --    < > = values in this interval not displayed.    Estimated Creatinine Clearance: 23.9 mL/min (by C-G formula based on SCr of 0.9 mg/dL).   Medical History: Past Medical History:  Diagnosis Date  . Abnormal chest x-ray   . Anxiety   . Breast cancer ([place])    stage 0 left  . Diverticulosis of colon   . DJD (degenerative joint disease)   . Glaucoma   . Hip pain   . Hypercholesteremia   . Hypertension   . Hypothyroid   . Lumbar back pain   . Osteopenia   . Parkinson disease ([place])   . Peripheral vascular disease ([place])   . Venous insufficiency   . Vitamin D deficiency     Medications:  Medications Prior to Admission  Medication Sig Dispense Refill Last Dose  . carbidopa-levodopa-entacapone (STALEVO) 25-100-200 MG tablet Take 1 tablet by mouth 3 (three) times daily. (Patient taking differently: Take 1 tablet by mouth 2 (two) times daily. ) 270 tablet 4 06/20/2018 at 800  . Cholecalciferol ([employer] D3) 2000 units CAPS Take 1 capsule (2,000 Units total) by mouth daily. 90 capsule 3 06/20/2018 at am  . levothyroxine (SYNTHROID, LEVOTHROID) 50 MCG tablet Take 1 tablet (50 mcg  total) by mouth daily before breakfast. 90 tablet 1 06/20/2018 at am  . Brinzolamide-Brimonidine (SIMBRINZA) 1-0.2 % SUSP Place 1 drop into both eyes 2 (two) times daily.   06/16/2018 at pm  . latanoprost (XALATAN) 0.005 % ophthalmic solution Place 1 drop into both eyes at bedtime.    06/16/2018 at pm   Scheduled:  . aspirin  300 mg Rectal Daily  . entacapone  200 mg Oral BID   And  . carbidopa-levodopa  1 tablet Oral BID  . enoxaparin (LOVENOX) injection  30 mg Subcutaneous Q24H  . latanoprost  1 drop Both Eyes QHS  . levothyroxine  25 mcg Intravenous Daily   Infusions:  . dextrose      Assessment: 77yo female admitted 12/27 for dehydration and hypernatremia, now w/ confirmed DVT involving right IJV, subclavian, and axillary veins; of note pt has been on Lovenox 30mg  SQ Q24H for DVT Px, which given pt's size and  age, is actually full dose and should cover DVT.  Goal of Therapy:  Anti-Xa level 0.6-1 units/ml 4hrs after LMWH dose given Monitor platelets by anticoagulation protocol: Yes   Plan:  Will get anti-Xa level this am to confirm current dosing.  Wynona Neat, PharmD, BCPS  06/23/2018,6:09 AM

## 2018-06-23 NOTE — Progress Notes (Signed)
VAST RN to room for am CL check; dsg not covering most of CL or insertion site. Dsg removed, site cleansed well, and new Biopatch and dsg placed. Educated unit RN if dressing becomes loose again, to contact IVT immediately for a dsg change to minimize infection risk.

## 2018-06-23 NOTE — Progress Notes (Signed)
  Speech Language Pathology Treatment: Dysphagia  Patient Details Name: [name] MRN: [id number] DOB: 12/17/1940 Today's Date: 06/23/2018 Time: 0511-0211 SLP Time Calculation (min) (ACUTE ONLY): 13 min  Assessment / Plan / Recommendation Clinical Impression  Pt is alert but not following commands consistently and making minimal attempts to accept and manipulate different PO consistencies offered. Audible secretions still noted. Pt seems grossly unchanged from prior evaluations. Suspect an acute on chronic dysphagia; however, given that MD note suggests that family wants aggressive care, would remain NPO except for ice chips if fully alert and accepting. Would focus heavily on oral care, as pt has a thick, orange coating. Unable to observe all of her oral cavity, but some was removed from her front dentition. Will continue to follow, although suspect that risk for aspiration and malnutrition may be more chronic.     HPI HPI: 76 y.o. female with past medical history significant for Parkinson disease, advanced dementia, failure to thrive, hypothyroidism, who presented to [facility] ED from home where she lives with her husband due to somnolence with difficulty to arouse of 1 day duration; daughter stated pt has exhibited limited oral intake over the last 2 weeks and is drinking Ensure primarily for intake; CXR on 12/271/9 indicated Stable elevation right hemidiaphragm with right base atelectasis. No edema or consolidation. Small granuloma left upper lobe. Stable cardiac silhouette; CT head on 06/20/18 negative for intracranial abnormality      SLP Plan  Continue with current plan of care       Recommendations  Diet recommendations: NPO(except ice chips) Medication Administration: Via alternative means                Oral Care Recommendations: Oral care QID;Oral care prior to ice chip/H20 Follow up Recommendations: 24 hour supervision/assistance SLP Visit Diagnosis: Dysphagia, unspecified  (R13.10) Plan: Continue with current plan of care       GO                Germain Osgood 06/23/2018, 3:19 PM  Germain Osgood, M.A. [place] Acute Environmental education officer [phone] Office [phone]

## 2018-06-23 NOTE — Consult Note (Signed)
            [facility] CM Primary Care Navigator  06/23/2018  Shanara Schnieders 1941/01/01 [id number]   Went to see patient at the bedside to identify possible discharge needs but she was fast asleep and no family noted in the room. Daughter was here this morning according to staff.    Per staff report, patient has dementia and unable to answer questions or follow commands.  PerMD note, patient presented to [facility]-ED from home (where she lives with her husband) due to somnolence with difficulty to arouse and work-up here showed extremely cachectic weak elderly with severe dehydration and hypernatremia.    Palliative care consulted and notes reveal that patient is followed by [facility] at home. Meeting was arranged with family for tomorrow and patient has remained full code.  Primary care provider's office is listed as providing transition of care (TOC) follow-up.  Will attempt to see patient/ family member when available in the room after meeting with palliative care team.    Addendum (06/24/18):  Went back to see patient/ family at the bedside to identify possible discharge needs but noted Palliative care note today that family all agreed that patient would prefer to go home with Hospice and receive comfort care for the duration of her life which is likely weeks and family agrees that patient would want DNR status.  Noted no further [facility] care management needs identifiableat this point.   For additional questions please contact:  Edwena Felty A. Welborn Keena, BSN, RN-BC [facility] PRIMARY CARE Navigator Cell: [phone]

## 2018-06-23 NOTE — Progress Notes (Signed)
PROGRESS NOTE                                                                                                                                                                                                             Patient Demographics:    [name], is a 77 y.o. female, DOB - 05/10/1941, VXY:[id number]  Admit date - 06/20/2018   Admitting Physician Kayleen Memos, DO  Outpatient Primary MD for the patient is Janith Lima, MD  LOS - 3  Chief Complaint  Patient presents with  . Altered Mental Status       Brief Narrative  [name] is a 77 y.o. female with past medical history significant for Parkinson disease, advanced dementia, failure to thrive, hypothyroidism, who presented to [facility] ED from home where she lives with her husband due to somnolence with difficulty to arouse of 1 day duration, work-up here showed extremely cachectic weak elderly African-American female with severe dehydration and hypernatremia.  CT head and UA unremarkable and she was admitted for further treatment.   Subjective:   Patient in bed, unable to answer questions or follow commands, does open eyes to verbal commands.   Assessment  & Plan :     1.  Extreme dehydration and hypernatremia due to poor oral intake which is happening from advanced Parkinson's in a patient with terminal decline.  Discussed with daughter and husband in detail, they unfortunately have unrealistic expectations, they want to pursue aggressive measures and want to keep the patient full code.  To me patient appears to be in terminal decline with severe cachexia, dehydration, protein calorie malnutrition.  She likely has been in this decline for the last several months if not years, she weighs 53 kg's.  She has been started on IV fluids with gradual reduction in hyponatremia, CT head and MRI brain have been nonacute, family tells me that patient was quite functional till 1 week ago which I highly doubt, speech  therapy following, PT OT, will require placement.   2.  Advanced Parkinson's.  Supportive care.  Appears to be in terminal decline.  3.  Cachexia, severe protein calorie malnutrition, failure to thrive.  Seen by speech, placed on ice chips, once taking oral diet will add protein supplementation.  This seems to be a terminal decline process.  4.  Hypothyroidism.  Synthroid switched to IV as TSH is elevated.  Will monitor.  5.  Right IJ, subclavian and axillary vein clot found incidentally during central line  placement by IR- placed on low-dose Lovenox at least for 3 to 6 months.  Family understands the risks and benefits.  Outpatient allergy follow-up if desired.      Family Communication  :  daughter  Code Status :  Full  Disposition Plan  :  TBD  Consults  :  None  Procedures  :    CT head - non acute  Vascular ultrasound of the arms and neck.  Findings consistent with acute deep vein thrombosis involving the right internal jugular veins, right subclavian veins and right axillary vein.  MRI -   1. No acute intracranial abnormality. 2. Prominent lateral ventriculomegaly, progressed from 2014 and favored to reflect central predominant cerebral atrophy rather than hydrocephalus. There is progressive, severe mesial temporal lobe atrophy. 3. Advanced chronic small vessel ischemic disease. 4. 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  DVT Prophylaxis  :  Lovenox   Lab Results  Component Value Date   PLT 107 (L) 06/23/2018    Diet :  Diet Order            Diet NPO time specified  Diet effective now               Inpatient Medications Scheduled Meds: . aspirin  300 mg Rectal Daily  . entacapone  200 mg Oral BID   And  . carbidopa-levodopa  1 tablet Oral BID  . enoxaparin (LOVENOX) injection  30 mg Subcutaneous Q24H  . latanoprost  1 drop Both Eyes QHS  . levothyroxine  25 mcg Intravenous Daily   Continuous Infusions: . dextrose 75 mL/hr  at 06/23/18 0809  . potassium chloride     PRN Meds:.  Antibiotics  :   Anti-infectives (From admission, onward)   None          Objective:   Vitals:   06/22/18 0453 06/22/18 1701 06/22/18 2032 06/23/18 0448  BP:  99/72 104/61 104/69  Pulse:  90 89 73  Resp:  18 19 19   Temp:  98.9 F (37.2 C) 99.9 F (37.7 C) 99.6 F (37.6 C)  TempSrc:  Oral Oral Oral  SpO2:  91% 91% 100%  Weight: 29.9 kg     Height:        Wt Readings from Last 3 Encounters:  06/22/18 29.9 kg  05/29/18 32.3 kg  02/25/18 36.7 kg     Intake/Output Summary (Last 24 hours) at 06/23/2018 1156 Last data filed at 06/22/2018 1823 Gross per 24 hour  Intake 608.44 ml  Output -  Net 608.44 ml     Physical Exam  Extremely frail, elderly, cachectic African-American female, left IJ central line in place [place].AT,PERRAL Supple Neck,No JVD, No cervical lymphadenopathy appriciated.  Symmetrical Chest wall movement, Good air movement bilaterally, CTAB RRR,No Gallops, Rubs or new Murmurs, No Parasternal Heave +ve B.Sounds, Abd Soft, No tenderness, No organomegaly appriciated, No rebound - guarding or rigidity. No Cyanosis, Clubbing or edema, No new Rash or bruise     Data Review:    CBC Recent Labs  Lab 06/20/18 1400 06/22/18 0346 06/23/18 0400  WBC 8.7 11.6* 8.6  HGB 16.7* 13.5 12.3  HCT 56.8* 43.4 39.8  PLT 126* 124* 107*  MCV 103.8* 101.2* 99.3  MCH 30.5 31.5 30.7  MCHC 29.4* 31.1 30.9  RDW 13.3 13.3 13.2  LYMPHSABS 0.9  --   --   MONOABS 0.5  --   --   EOSABS 0.0  --   --   BASOSABS  0.0  --   --     Chemistries  Recent Labs  Lab 06/20/18 1400  06/21/18 1822 06/22/18 0346 06/22/18 1021 06/22/18 1815 06/23/18 0400  NA 168*   < > 159* 160* 157* 149* 151*  K 4.2   < > 3.3* 3.2* 3.1* 4.0 3.4*  CL 127*   < > 123* 123* 120* 116* 116*  CO2 25   < > 28 30 29 26 26   GLUCOSE 113*   < > 236* 178* 203* 184* 87  BUN 49*   < > 30* 26* 24* 21 19  CREATININE 1.38*   < > 0.77 0.90 0.75  0.73 0.90  CALCIUM 10.2   < > 9.1 9.0 8.8* 8.7* 8.5*  MG  --   --  2.1 2.0  --   --   --   AST 31  --   --   --   --   --   --   ALT 19  --   --   --   --   --   --   ALKPHOS 66  --   --   --   --   --   --   BILITOT 1.9*  --   --   --   --   --   --    < > = values in this interval not displayed.   ------------------------------------------------------------------------------------------------------------------ No results for input(s): CHOL, HDL, LDLCALC, TRIG, CHOLHDL, LDLDIRECT in the last 72 hours.  Lab Results  Component Value Date   HGBA1C 5.4 05/29/2018   ------------------------------------------------------------------------------------------------------------------ Recent Labs    06/20/18 2251  TSH 11.390*   ------------------------------------------------------------------------------------------------------------------ No results for input(s): VITAMINB12, FOLATE, FERRITIN, TIBC, IRON, RETICCTPCT in the last 72 hours.  Coagulation profile Recent Labs  Lab 06/20/18 1400 06/21/18 1822  INR 1.19 1.42    No results for input(s): DDIMER in the last 72 hours.  Cardiac Enzymes Recent Labs  Lab 06/20/18 1400  TROPONINI 0.05*   ------------------------------------------------------------------------------------------------------------------ No results found for: BNP  Micro Results No results found for this or any previous visit (from the past 240 hour(s)).  Radiology Reports Ct Head Wo Contrast  Result Date: 06/20/2018 CLINICAL DATA:  Altered level of consciousness, confusion EXAM: CT HEAD WITHOUT CONTRAST TECHNIQUE: Contiguous axial images were obtained from the base of the skull through the vertex without intravenous contrast. COMPARISON:  CT brain of 05/29/2017 and CT brain of 12/16/2015 FINDINGS: Brain: Ventricular dilatation is unchanged compared to the prior CT as are the prominent cortical sulci, consistent with diffuse atrophy. The septum is in normal  midline position. Moderate small vessel ischemic changes again noted throughout the periventricular white matter. No hemorrhage, mass lesion, or acute infarction is seen. Vascular: No vascular abnormality is noted on this unenhanced study. Skull: On bone window images, probable old calcified meningioma is present in the left frontal region and is stable. No acute calvarial abnormality is seen. Sinuses/Orbits: The paranasal sinuses appear well pneumatized. Some extrusion of fat is noted within the right maxillary sinus from the inferior right orbit which was present previously and may be due to prior trauma. Other: None. IMPRESSION: 1. Stable atrophy and moderately severe small vessel ischemic change. No acute intracranial abnormality. 2. Some extrusion of fat is noted from the inferior right lobe into the right maxillary sinus most likely due to prior trauma. This is unchanged compared to the prior CT of 12/16/2015. Electronically Signed   By: Ivar Drape M.D.   On:  06/20/2018 16:13   Ct Head Wo Contrast  Result Date: 05/29/2018 CLINICAL DATA:  Multiple recent falls, the most recent this morning. Unsteady gait. EXAM: CT HEAD WITHOUT CONTRAST TECHNIQUE: Contiguous axial images were obtained from the base of the skull through the vertex without intravenous contrast. COMPARISON:  11/08/2016, 07/23/2016 and 12/16/2015. FINDINGS: Brain: Stable moderate to marked diffuse enlargement of the ventricles and subarachnoid spaces. 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 A small, old, densely calcified left anterior frontal meningioma is unchanged. No intracranial hemorrhage or CT evidence of acute infarction. Vascular: No hyperdense vessel or unexpected calcification. Skull: Normal. Negative for fracture or focal lesion. 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 No acute abnormality. Other: Small bilateral posterior scalp hematomas or areas of scarring. IMPRESSION: 1. No acute abnormality. 2. Stable moderate to marked diffuse cerebral and cerebellar atrophy. 3. 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 4. Stable small, densely calcified left anterior frontal meningioma. 5. 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 Electronically Signed   By: Claudie Revering M.D.   On: 05/29/2018 15:40   Mr Brain Wo Contrast  Result Date: 06/23/2018 CLINICAL DATA:  Somnolence. History of advanced dementia with failure to thrive. EXAM: MRI HEAD WITHOUT CONTRAST TECHNIQUE: Multiplanar, multiecho pulse sequences of the brain and surrounding structures were obtained without intravenous contrast. COMPARISON:  Head CT 06/20/2018 FINDINGS: Brain: There is susceptibility artifact of dental origin which partially obscures the posterior fossa and anterior frontal lobes on diffusion imaging. Within this limitation, no acute infarct is identified. 1 cm markedly T2 hypointense extra-axial signal focus over the anterior left frontal convexity is unchanged and may represent a heavily calcified meningioma or exostosis, of no clinical significance. No intracranial hemorrhage, midline shift, or extra-axial fluid collection is identified. A small chronic right parietal cortical infarct is new from the prior MRI. A small chronic left occipital infarct is unchanged. There is a chronic lacunar infarct in the left thalamus. Patchy to confluent T2 hyperintensities in the cerebral white matter bilaterally have mildly progressed from 2014 and are nonspecific but compatible with advanced chronic small vessel ischemic  disease. Moderate to severe lateral ventriculomegaly has mildly increased from 2014. The third ventricle is mildly to moderately enlarged, and the cerebral sulci are mildly enlarged diffusely. There is prominent mesial temporal lobe atrophy bilaterally, progressed from 2014. Vascular: Major intracranial vascular flow voids are preserved with the left vertebral artery being dominant. Skull and upper cervical spine: No suspicious marrow lesion. Sinuses/Orbits: Bilateral cataract extraction. Old right orbital floor fracture with herniation of fat into the maxillary sinus, new from 2014. No evidence of inflammatory sinus disease. Other: None. IMPRESSION: 1. No acute intracranial abnormality. 2. Prominent lateral ventriculomegaly, progressed from 2014 and favored to reflect central predominant cerebral atrophy rather than hydrocephalus. There is progressive, severe mesial temporal lobe atrophy. 3. Advanced chronic small vessel ischemic disease. 4. 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 Electronically Signed   By: Logan Bores M.D.   On: 06/23/2018 11:01   Ir Fluoro Guide Cv Line Left  Result Date: 06/22/2018 INDICATION: 78 year old with hypernatremia and poor venous access. PICC  line team was unable to place central line. EXAM: FLUOROSCOPIC AND ULTRASOUND GUIDED PLACEMENT OF A CENTRAL VENOUS CATHETER Physician: Stephan Minister. Henn, MD FLUOROSCOPY TIME:  2 minutes and 54 seconds MEDICATIONS: None ANESTHESIA/SEDATION: None PROCEDURE: Informed consent was obtained for a central venous catheter by the family. Patient has advanced dementia and unable to give consent. The patient was placed supine on the interventional table. Ultrasound confirmed a patent left internal jugularvein. Ultrasound images were obtained for documentation. The left neck was prepped and draped in a sterile fashion. The left neck neck was anesthetized with 1% lidocaine. Maximal barrier sterile technique was utilized including  caps, mask, sterile gowns, sterile gloves, sterile drape, hand hygiene and skin antiseptic. A 21 gauge needle directed into the left internal jugular vein with ultrasound guidance. A micropuncture dilator set was placed. It was difficult to advance a wire centrally. Eventually, a 0.018 wire was advanced centrally and a peel-away sheath was placed. A dual lumen Power PICC line was cut to 17 cm and advanced over the wire in order to keep central venous access. Catheter tip was placed at the SVC and right atrium junction. Both lumens aspirated and flushed well. Both lumens were capped. The catheter was sutured to skin. Fluoroscopic and ultrasound images were taken and saved for documentation. FINDINGS: Right internal jugular vein is enlarged and contains thrombus. Patent left internal jugular vein. Catheter tip at the SVC and right atrium junction. Chronic elevation of the right hemidiaphragm. COMPLICATIONS: None IMPRESSION: Successful placement of left internal jugular central venous catheter. Catheter tip at the SVC and right atrium junction and ready to use. Thrombus in the right internal jugular vein. Right internal jugular vein is enlarged and this thrombus appears to be acute or subacute. Findings discussed with Dr. Candiss Norse immediately following placement of the central venous catheter. Electronically Signed   By: Markus Daft M.D.   On: 06/22/2018 09:19   Ir US Guide Vasc Access Left  Result Date: 06/22/2018 INDICATION: 77 year old with hypernatremia and poor venous access. PICC line team was unable to place central line. EXAM: FLUOROSCOPIC AND ULTRASOUND GUIDED PLACEMENT OF A CENTRAL VENOUS CATHETER Physician: Stephan Minister. Henn, MD FLUOROSCOPY TIME:  2 minutes and 54 seconds MEDICATIONS: None ANESTHESIA/SEDATION: None PROCEDURE: Informed consent was obtained for a central venous catheter by the family. Patient has advanced dementia and unable to give consent. The patient was placed supine on the interventional  table. Ultrasound confirmed a patent left internal jugularvein. Ultrasound images were obtained for documentation. The left neck was prepped and draped in a sterile fashion. The left neck neck was anesthetized with 1% lidocaine. Maximal barrier sterile technique was utilized including caps, mask, sterile gowns, sterile gloves, sterile drape, hand hygiene and skin antiseptic. A 21 gauge needle directed into the left internal jugular vein with ultrasound guidance. A micropuncture dilator set was placed. It was difficult to advance a wire centrally. Eventually, a 0.018 wire was advanced centrally and a peel-away sheath was placed. A dual lumen Power PICC line was cut to 17 cm and advanced over the wire in order to keep central venous access. Catheter tip was placed at the SVC and right atrium junction. Both lumens aspirated and flushed well. Both lumens were capped. The catheter was sutured to skin. Fluoroscopic and ultrasound images were taken and saved for documentation. FINDINGS: Right internal jugular vein is enlarged and contains thrombus. Patent left internal jugular vein. Catheter tip at the SVC and right atrium junction. Chronic elevation of the right hemidiaphragm. COMPLICATIONS:  None IMPRESSION: Successful placement of left internal jugular central venous catheter. Catheter tip at the SVC and right atrium junction and ready to use. Thrombus in the right internal jugular vein. Right internal jugular vein is enlarged and this thrombus appears to be acute or subacute. Findings discussed with Dr. Candiss Norse immediately following placement of the central venous catheter. Electronically Signed   By: Markus Daft M.D.   On: 06/22/2018 09:19   Dg Chest Portable 1 View  Result Date: 06/20/2018 CLINICAL DATA:  Hypertension.  Altered mental status EXAM: PORTABLE CHEST 1 VIEW COMPARISON:  June 17, 2013 FINDINGS: There is elevation of the right hemidiaphragm with mild right base atelectasis. There is a small calcified  granuloma in the left upper lobe. There is no edema or consolidation. The heart size and pulmonary vascularity are normal. There is aortic atherosclerosis. No adenopathy. No bone lesions. IMPRESSION: Stable elevation right hemidiaphragm with right base atelectasis. No edema or consolidation. Small granuloma left upper lobe. Stable cardiac silhouette. There is aortic atherosclerosis. Aortic Atherosclerosis (ICD10-I70.0). Electronically Signed   By: Lowella Grip III M.D.   On: 06/20/2018 14:58   Vas US Carotid  Result Date: 06/22/2018 Carotid Arterial Duplex Study Indications:       TIA. Other Factors:     Parkinson Disease; Dementia. Limitations:       Patient is in deep somnolent with multiple lines on sites,                    immobility, stiffness of the neck and the whole                    body,inability to cooperate the exam. Comparison Study:  No prior study available. Performing Technologist: Rudell Cobb  Examination Guidelines: A complete evaluation includes B-mode imaging, spectral Doppler, color Doppler, and power Doppler as needed of all accessible portions of each vessel. Bilateral testing is considered an integral part of a complete examination. Limited examinations for reoccurring indications may be performed as noted.  Right Carotid Findings: +----------+--------+--------+--------+--------+--------+           PSV cm/sEDV cm/sStenosisDescribeComments +----------+--------+--------+--------+--------+--------+ CCA Prox  73      12                               +----------+--------+--------+--------+--------+--------+ CCA Distal63      11                               +----------+--------+--------+--------+--------+--------+ ICA Prox  81      11      1-39%           tortuous +----------+--------+--------+--------+--------+--------+ ICA Distal42      14                               +----------+--------+--------+--------+--------+--------+ ECA       74      13                                +----------+--------+--------+--------+--------+--------+ +----------+--------+-------+--------+-------------------+           PSV cm/sEDV cmsDescribeArm Pressure (mmHG) +----------+--------+-------+--------+-------------------+ Subclavian108                                        +----------+--------+-------+--------+-------------------+ +---------+--------+--+--------+--+---------+  VertebralPSV cm/s47EDV cm/s10Antegrade +---------+--------+--+--------+--+---------+  Left Carotid Findings: +----------+--------+--------+--------+-------------------+ SubclavianPSV cm/sEDV cm/sDescribeArm Pressure (mmHG) +----------+--------+--------+--------+-------------------+           37                                          +----------+--------+--------+--------+-------------------+ Unable to approach due to multiple lines and PICC on site.  Summary: Right Carotid: Velocities in the right ICA are consistent with a 1-39% stenosis. Vertebrals: Right vertebral artery demonstrates antegrade flow. Right vertebral             artery demonstrates high resistant flow. *See table(s) above for measurements and observations.     Preliminary    Vas [place] Upper Extremity Venous Duplex  Result Date: 06/22/2018 UPPER VENOUS STUDY  Indications: Swelling Risk Factors: Radiation therapy received. Cancer Breast cancer. Limitations: Body habitus, line and PICC and multiple lines on sites; deep somnolent, inability to move, stiffness of the whole body. Unable to move arms. Comparison Study: No comparison study available. Performing Technologist: Rudell Cobb  Examination Guidelines: A complete evaluation includes B-mode imaging, spectral Doppler, color Doppler, and power Doppler as needed of all accessible portions of each vessel. Bilateral testing is considered an integral part of a complete examination. Limited examinations for reoccurring indications may be performed as noted.   Right Findings: +----------+------------+----------+---------+-----------+--------------+ RIGHT     CompressiblePropertiesPhasicitySpontaneous   Summary     +----------+------------+----------+---------+-----------+--------------+ IJV           None                 No        No         Acute      +----------+------------+----------+---------+-----------+--------------+ Subclavian    None                 No        No         Acute      +----------+------------+----------+---------+-----------+--------------+ Axillary      None                                      Acute      +----------+------------+----------+---------+-----------+--------------+ Brachial      Full                           Yes                   +----------+------------+----------+---------+-----------+--------------+ Radial                                              Not visualized +----------+------------+----------+---------+-----------+--------------+ Ulnar                                               Not visualized +----------+------------+----------+---------+-----------+--------------+ Cephalic      Full                                                 +----------+------------+----------+---------+-----------+--------------+  Basilic                                             Not visualized +----------+------------+----------+---------+-----------+--------------+  Left Findings: +----------+------------+----------+---------+-----------+--------------+ LEFT      CompressiblePropertiesPhasicitySpontaneous   Summary     +----------+------------+----------+---------+-----------+--------------+ IJV           Full                 Yes       Yes                   +----------+------------+----------+---------+-----------+--------------+ Subclavian    Full                 Yes       Yes                    +----------+------------+----------+---------+-----------+--------------+ Axillary      Full                           Yes                   +----------+------------+----------+---------+-----------+--------------+ Brachial      Full                           Yes                   +----------+------------+----------+---------+-----------+--------------+ Radial                                              Not visualized +----------+------------+----------+---------+-----------+--------------+ Ulnar                                               Not visualized +----------+------------+----------+---------+-----------+--------------+ Cephalic                                            Not visualized +----------+------------+----------+---------+-----------+--------------+ Basilic                                             Not visualized +----------+------------+----------+---------+-----------+--------------+  Summary:  Right: Findings consistent with acute deep vein thrombosis involving the right internal jugular veins, right subclavian veins and right axillary vein.  Left: This was a limited study.  *See table(s) above for measurements and observations.    Preliminary    [place] Ekg Site Rite  Result Date: 06/21/2018 If Site Rite image not attached, placement could not be confirmed due to current cardiac rhythm.   Time Spent in minutes  30   Lala Lund M.D on 06/23/2018 at 11:56 AM  To page go to [email or web address] - password [facility]

## 2018-06-23 NOTE — Progress Notes (Signed)
PT Cancellation Note  Patient Details Name: [name] MRN: [id number] DOB: 1940-09-23   Cancelled Treatment:    Reason Eval/Treat Not Completed: Patient at procedure or test/unavailable Patient now on Lovenox dosing following discovery of thrombi in IJ, subclavian, and axillary vessels. Attempted to evaluate patient, however she was out of room/unavailable. Will attempt to return later if time/schedule allow, otherwise will attempt on next day of service.   Deniece Ree PT, DPT, CBIS  Supplemental Physical Therapist [facility]    Pager [phone] Acute Rehab Office [phone]

## 2018-06-24 DIAGNOSIS — G20 Parkinson's disease: Secondary | ICD-10-CM

## 2018-06-24 LAB — BASIC METABOLIC PANEL
Anion gap: 8 (ref 5–15)
BUN: 15 mg/dL (ref 8–23)
CO2: 25 mmol/L (ref 22–32)
Calcium: 5.4 mg/dL — CL (ref 8.9–10.3)
Chloride: 113 mmol/L — ABNORMAL HIGH (ref 98–111)
Creatinine, Ser: 0.71 mg/dL (ref 0.44–1.00)
GFR calc Af Amer: >60 mL/min (ref >60)
Glucose, Bld: 100 mg/dL — ABNORMAL HIGH (ref 70–99)
Potassium: 5 mmol/L (ref 3.5–5.1)
Sodium: 146 mmol/L — ABNORMAL HIGH (ref 135–145)

## 2018-06-24 LAB — COMPREHENSIVE METABOLIC PANEL
ALT: 11 U/L (ref 0–44)
AST: 21 U/L (ref 15–41)
Albumin: 2 g/dL — ABNORMAL LOW (ref 3.5–5.0)
Alkaline Phosphatase: 47 U/L (ref 38–126)
Anion gap: 4 — ABNORMAL LOW (ref 5–15)
BUN: 14 mg/dL (ref 8–23)
CO2: 27 mmol/L (ref 22–32)
Calcium: 9 mg/dL (ref 8.9–10.3)
Chloride: 110 mmol/L (ref 98–111)
Creatinine, Ser: 0.62 mg/dL (ref 0.44–1.00)
GFR calc Af Amer: >60 mL/min (ref >60)
Glucose, Bld: 97 mg/dL (ref 70–99)
Potassium: 3.3 mmol/L — ABNORMAL LOW (ref 3.5–5.1)
Sodium: 141 mmol/L (ref 135–145)
Total Bilirubin: 0.9 mg/dL (ref 0.3–1.2)
Total Protein: 4.5 g/dL — ABNORMAL LOW (ref 6.5–8.1)

## 2018-06-24 LAB — HEPARIN ANTI-XA: Heparin LMW: 0.21 IU/mL

## 2018-06-24 LAB — CBC
HCT: 32.8 % — ABNORMAL LOW (ref 36.0–46.0)
Hemoglobin: 10.2 g/dL — ABNORMAL LOW (ref 12.0–15.0)
MCH: 30.7 pg (ref 26.0–34.0)
MCHC: 31.1 g/dL (ref 30.0–36.0)
MCV: 98.8 fL (ref 80.0–100.0)
Platelets: 104 10*3/uL — ABNORMAL LOW (ref 150–400)
RBC: 3.32 MIL/uL — ABNORMAL LOW (ref 3.87–5.11)
RDW: 13.2 % (ref 11.5–15.5)
WBC: 7.1 10*3/uL (ref 4.0–10.5)
nRBC: 0 % (ref 0.0–0.2)

## 2018-06-24 LAB — MAGNESIUM: Magnesium: 1 mg/dL — ABNORMAL LOW (ref 1.7–2.4)

## 2018-06-24 MED ORDER — CALCIUM GLUCONATE-NACL 2-0.675 GM/100ML-% IV SOLN
2.0000 g | Freq: Once | INTRAVENOUS | Status: AC
  Administered 2018-06-24: 2000 mg via INTRAVENOUS
  Filled 2018-06-24: qty 100

## 2018-06-24 MED ORDER — APIXABAN 2.5 MG PO TABS
2.5000 mg | ORAL_TABLET | Freq: Two times a day (BID) | ORAL | Status: DC
  Administered 2018-06-24 – 2018-06-25 (×2): 2.5 mg via ORAL
  Filled 2018-06-24 (×2): qty 1

## 2018-06-24 MED ORDER — LORAZEPAM 2 MG/ML IJ SOLN: 1.0000 mg | Freq: Once | INTRAMUSCULAR | Status: DC

## 2018-06-24 MED ORDER — DEXTROSE 5 % IV SOLN
INTRAVENOUS | Status: DC
  Administered 2018-06-24: 09:00:00 via INTRAVENOUS

## 2018-06-24 MED ORDER — ENOXAPARIN SODIUM 60 MG/0.6ML ~~LOC~~ SOLN: 45.0000 mg | SUBCUTANEOUS | Status: DC

## 2018-06-24 NOTE — Progress Notes (Signed)
Daily Progress Note   Patient Name: [name]       Date: 06/24/2018 DOB: 08/17/1940  Age: 77 y.o. MRN#: [id number] Attending Physician: Thurnell Lose, MD Primary Care Physician: Janith Lima, MD Admit Date: 06/20/2018  Reason for Consultation/Follow-up: Establishing goals of care  Subjective: Met with patient's daughter- Malachy Mood, son, husbandDenyse Amass, and DIL.  Brief life review of patient conducted- patient and spouse married for 6 years. 2 children. Husband describes Brianca as "spicy".  Discussed functional and nutritional status- they have noted increased falls and decrease in ability to ambulate since September. She has decrease in swallowing and ability to eat with weight loss over 10% in the last 3 months which is significant. Over the last three weeks she has been sleeping more than she is awake. We discussed what it looks like when someone is dying. Discussed changes in three domains of nutrition, function and cognition.  Aggressive care including continued replacement of electrolytes, IV fluids, feeding tube with artificial nutrition vs home with comfort and Hospice was discussed.  Family all agreed that patient would prefer to go home with Hospice and receive comfort care for the duration of her life which is likely weeks.  We discussed code status. All family agrees that patient would want DNR status.  Review of Systems  Unable to perform ROS: Mental acuity    Length of Stay: 4  Current Medications: Scheduled Meds:  . aspirin  300 mg Rectal Daily  . entacapone  200 mg Oral BID   And  . carbidopa-levodopa  1 tablet Oral BID  . enoxaparin (LOVENOX) injection  30 mg Subcutaneous Q24H  . latanoprost  1 drop Both Eyes QHS  . levothyroxine  25 mcg Intravenous Daily     Continuous Infusions: . dextrose 75 mL/hr at 06/24/18 0843    PRN Meds: sodium chloride flush  Physical Exam Vitals signs and nursing note reviewed.  Constitutional:      Comments: Severe cachexia  HENT:     Mouth/Throat:     Mouth: Mucous membranes are dry.  Musculoskeletal:     Comments: Diffuse muscle wasting  Neurological:     Comments: Lethargic, does not arouse to my voice             Vital Signs: BP 109/60 (BP Location: Right Arm)  Pulse 63   Temp 98 F (36.7 C) (Oral)   Resp 18   Ht '5\' 2"'$  (1.575 m)   Wt 29.9 kg   SpO2 91%   BMI 12.06 kg/m  SpO2: SpO2: 91 % O2 Device: O2 Device: Nasal Cannula O2 Flow Rate: O2 Flow Rate (L/min): 2.5 L/min  Intake/output summary: No intake or output data in the 24 hours ending 06/24/18 1150 LBM:   Baseline Weight: Weight: 31.8 kg Most recent weight: Weight: 29.9 kg       Palliative Assessment/Data: PPS: 10%    Flowsheet Rows     Most Recent Value  Intake Tab  Referral Department  Hospitalist  Unit at Time of Referral  Intermediate Care Unit  Palliative Care Primary Diagnosis  Other (Comment) [ftt]  Date Notified  06/22/18  Palliative Care Type  New Palliative care  Reason for referral  Clarify Goals of Care  Date of Admission  06/20/18  # of days IP prior to Palliative referral  2  Clinical Assessment  Psychosocial & Spiritual Assessment  Palliative Care Outcomes      Patient Active Problem List   Diagnosis Date Noted  . Altered mental status   . Hypernatremia   . Palliative care by specialist   . Advanced care planning/counseling discussion   . Dehydration with hypernatremia 06/20/2018  . Hyperglycemia 05/29/2018  . Head trauma 05/29/2018  . Malnutrition compromising bodily function ([place]) 01/08/2017  . Falls frequently 08/05/2015  . Urolithiasis 04/21/2015  . Osteoporosis 04/18/2015  . Routine general medical examination at a health care facility 09/14/2014  . Parkinson's disease ([place])  10/30/2012  . Severe dementia ([place]) 10/30/2012  . Stenosis, spinal, lumbar 05/19/2012  . Breast cancer, left breast ([place]) 05/22/2010  . Vitamin D deficiency 04/26/2008  . GLAUCOMA 11/09/2007  . HYPERCHOLESTEROLEMIA 10/27/2007  . Hypothyroidism 05/01/2007  . Essential hypertension 05/01/2007  . DEGENERATIVE JOINT DISEASE 05/01/2007    Palliative Care Assessment & Plan   Patient Profile: 77 y.o. female  with past medical history of Parkinson's disease, hypothyroidism, HTN  admitted on 06/20/2018 with altered mental status- she was somnolent, aroused only to familiar voices. Workup revealed severe hypernatremia, AKI due to severe dehydration, failure to thrive in patient with advancing Parkinson's disease. Palliative medicine consulted for [place].  Assessment/Recommendations/Plan   DNR  Continue IV fluids until discharge  Care manager referral for Hospice services and equipment before discharge  Goals of Care and Additional Recommendations:  Limitations on Scope of Treatment: Avoid Hospitalization, Minimize Medications, Initiate Comfort Feeding and No Artificial Feeding  Code Status:  DNR  Prognosis:   < 2 weeks  Discharge Planning:  Home with Hospice  Care plan was discussed with patient's daughter, son, spouse and family.   Thank you for allowing the Palliative Medicine Team to assist in the care of this patient.   Time In: 1100  Time Out: 1200 Total Time 60 mins Prolonged Time Billed yes      Greater than 50%  of this time was spent counseling and coordinating care related to the above assessment and plan.  Mariana Kaufman, AGNP-C Palliative Medicine   Please contact Palliative Medicine Team phone at [phone] for questions and concerns.

## 2018-06-24 NOTE — Progress Notes (Signed)
SLP Cancellation Note  Patient Details Name: [name] MRN: [id number] DOB: 1940-10-12   Cancelled treatment:       Reason Eval/Treat Not Completed: Other (comment) Chart reviewed including palliative care note, family opting for comfort, and order placed for comfort feeds from floor stock. Pt has not been able to participate in PO trials due to decreased arousal and/or attempts at manipulating POs. She is likely most appropriate for emphasis on oral care and thin liquids via swab for pleasure/taste. Also note possible seizure activity this afternoon. Will defer additional trials with SLP. Please reorder if we can be of assistance.   Germain Osgood 06/24/2018, 3:29 PM  Germain Osgood, M.A. [place] Acute Environmental education officer [phone] Office [phone]

## 2018-06-24 NOTE — Evaluation (Signed)
Physical Therapy Evaluation Patient Details Name: [name] MRN: [id number] DOB: [birth year] Today's Date: 06/24/2018   History of Present Illness  77 yo female admitted with dehydration, severe hypernatremia, FTT, AMS, AKI, bil UE DVT. Hx of Parkinson's, dementia  Clinical Impression  Limited bed level eval on today. Pt was too lethargic to attempt any mobility on today. Family was present during session. Plan is for a palliative care meeting on today. Will plan to follow on a trial basis for now.     Follow Up Recommendations SNF vs home with hospice (depending on family decision)    [place] [facility] bed;Wheelchair   Recommendations for Other Services       Precautions / Restrictions Precautions Precautions: Fall Restrictions Weight Bearing Restrictions: No      Mobility  Bed Mobility               General bed mobility comments: NT- pt did open her eyes slightly while performing UE and LE assessment. Too lethargic to attempt bed mobility on today  Transfers                    Ambulation/Gait                Stairs            Wheelchair Mobility    Modified Rankin (Stroke Patients Only)       Balance                                             Pertinent Vitals/Pain Pain Assessment: Faces Faces Pain Scale: No hurt    Home Living Family/patient expects to be discharged to:: Unsure Living Arrangements: Spouse/significant other Available Help at Discharge: Family Type of Home: House         Home Equipment: Walker - 4 wheels;Cane - single point(rails; handicap accessible bathroom) Additional Comments: pt has borrowed RW, WC from her deceased mother    Prior Function Level of Independence: Needs assistance   Gait / Transfers Assistance Needed: walking with 2 HHA with family a few weeks prior to admission  ADL's / Homemaking Assistance Needed: total assist        Hand Dominance         Extremity/Trunk Assessment   Upper Extremity Assessment Upper Extremity Assessment: LUE deficits/detail;RUE deficits/detail;Difficult to assess due to impaired cognition RUE Deficits / Details: shoulder flexion was tight-only able to get to ~75 degrees LUE Deficits / Details: shoulder PROM to at least 90 degrees;     Lower Extremity Assessment Lower Extremity Assessment: LLE deficits/detail;RLE deficits/detail;Difficult to assess due to impaired cognition RLE Deficits / Details: hip/knee flexion to ~45 degrees; DF to neutral LLE Deficits / Details: hip/knee flexion to ~45 degrees; DF to neutral       Communication      Cognition Arousal/Alertness: Lethargic Behavior During Therapy: Flat affect Overall Cognitive Status: History of cognitive impairments - at baseline                                        General Comments      Exercises     Assessment/Plan    PT Assessment Patient needs continued PT services(on trial basis)  PT Problem List Decreased strength;Decreased mobility;Decreased range of motion;Decreased  activity tolerance;Decreased balance;Decreased cognition;Decreased knowledge of use of DME       PT Treatment Interventions DME instruction;Functional mobility training;Balance training;Patient/family education;Therapeutic activities;Gait training;Therapeutic exercise    PT Goals (Current goals can be found in the Care Plan section)  Acute Rehab PT Goals Patient Stated Goal: per family, for pt to get better PT Goal Formulation: With family Time For Goal Achievement: 07/08/18 Potential to Achieve Goals: Poor    Frequency Min 2X/week(trial of PT)   Barriers to discharge        Co-evaluation               AM-PAC PT "6 Clicks" Mobility  Outcome Measure Help needed turning from your back to your side while in a flat bed without using bedrails?: Total Help needed moving from lying on your back to sitting on the side of a flat bed  without using bedrails?: Total Help needed moving to and from a bed to a chair (including a wheelchair)?: Total Help needed standing up from a chair using your arms (e.g., wheelchair or bedside chair)?: Total Help needed to walk in hospital room?: Total Help needed climbing 3-5 steps with a railing? : Total 6 Click Score: 6    End of Session   Activity Tolerance: Patient tolerated treatment well Patient left: in bed;with bed alarm set;with nursing/sitter in room;with family/visitor present   PT Visit Diagnosis: Muscle weakness (generalized) (M62.81);Adult, failure to thrive (R62.7);Other abnormalities of gait and mobility (R26.89);History of falling (Z91.81);Difficulty in walking, not elsewhere classified (R26.2)    Time: 2409-7353 PT Time Calculation (min) (ACUTE ONLY): 8 min   Charges:   PT Evaluation $PT Eval Moderate Complexity: [place], PT Acute Rehabilitation Services Pager: [phone] Office: [phone]

## 2018-06-24 NOTE — Progress Notes (Signed)
VAST RN called unit and spoke with unit RN, Aldona Bar regarding consult for heparin level. Educated that we want to draw labs at same time to minimize infection risk and asked if I could draw both heparin level and BMP at noon. Aldona Bar stated the physician had already called her for the heparin level and was anxious for result. BMP will be drawn early with heparin level.

## 2018-06-24 NOTE — Progress Notes (Signed)
PROGRESS NOTE                                                                                                                                                                                                             Patient Demographics:    [name], is a 77 y.o. female, DOB - 23-Sep-1940, SJG:[id number]  Admit date - 06/20/2018   Admitting Physician Kayleen Memos, DO  Outpatient Primary MD for the patient is Janith Lima, MD  LOS - 4  Chief Complaint  Patient presents with  . Altered Mental Status       Brief Narrative  [name] is a 77 y.o. female with past medical history significant for Parkinson disease, advanced dementia, failure to thrive, hypothyroidism, who presented to [facility] ED from home where she lives with her husband due to somnolence with difficulty to arouse of 1 day duration, work-up here showed extremely cachectic weak elderly African-American female with severe dehydration and hypernatremia.  CT head and UA unremarkable and she was admitted for further treatment.   Subjective:   Patient in bed, unable to answer questions or follow commands, does open eyes to verbal commands.   Assessment  & Plan :     1.  Extreme dehydration and hypernatremia due to poor oral intake which is happening from advanced Parkinson's in a patient with terminal decline.  Discussed with daughter and husband in detail, they unfortunately have unrealistic expectations, they want to pursue aggressive measures and want to keep the patient full code.  To me patient appears to be in terminal decline with severe cachexia, dehydration, protein calorie malnutrition.  She likely has been in this decline for the last several months if not years, she weighs 56 kg's.  She has been started on IV fluids with gradual reduction in hyponatremia, CT head and MRI brain have been nonacute, family tells me that patient was quite functional till 1 week ago which I highly doubt, speech  therapy following, clinically not improved and I do not think her long-term prognosis is good, have involved palliative care who have a meeting scheduled for later today with the family.  Family also for now is declining placement.   2.  Advanced Parkinson's.  Supportive care.  Appears to be in terminal decline.  3.  Cachexia, severe protein calorie malnutrition, failure to thrive.  Seen by speech, placed on ice chips, once taking oral diet will add protein supplementation.  This seems to be a terminal decline process.  4.  Hypothyroidism.  Synthroid switched to IV as TSH is elevated.  Will monitor.  5.  Right IJ, subclavian and axillary vein clot found incidentally during central [name] placement by IR- placed on low-dose Lovenox at least for 3 to 6 months.  Family understands the risks and benefits.  Outpatient allergy follow-up if desired.     6.  Hypocalcemia.  Was replaced this morning by 19, however we do not have a baseline albumin which I suspect will be quite low, will check a CMP and recalculate.    Family Communication  :  daughter  Code Status :  Full  Disposition Plan  :  TBD  Consults  :  None  Procedures  :    CT head - non acute  Vascular ultrasound of the arms and neck.  Findings consistent with acute deep vein thrombosis involving the right internal jugular veins, right subclavian veins and right axillary vein.  MRI -   1. No acute intracranial abnormality. 2. Prominent lateral ventriculomegaly, progressed from 2014 and favored to reflect central predominant cerebral atrophy rather than hydrocephalus. There is progressive, severe mesial temporal lobe atrophy. 3. Advanced chronic small vessel ischemic disease. 4. 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  DVT Prophylaxis  :  Lovenox   Lab Results  Component Value Date   PLT 104 (L) 06/24/2018    Diet :  Diet Order            Diet NPO time specified  Diet effective now                 Inpatient Medications Scheduled Meds: . aspirin  300 mg Rectal Daily  . entacapone  200 mg Oral BID   And  . carbidopa-levodopa  1 tablet Oral BID  . enoxaparin (LOVENOX) injection  30 mg Subcutaneous Q24H  . latanoprost  1 drop Both Eyes QHS  . levothyroxine  25 mcg Intravenous Daily   Continuous Infusions: . calcium gluconate    . dextrose 75 mL/hr at 06/24/18 0843   PRN Meds:.  Antibiotics  :   Anti-infectives (From admission, onward)   None          Objective:   Vitals:   06/23/18 1445 06/23/18 1539 06/23/18 2126 06/24/18 0513  BP: (!) 81/57 (!) 90/53 (!) 90/53 109/60  Pulse: 80 76 67 63  Resp: 18     Temp: 99.2 F (37.3 C)  98.8 F (37.1 C) 98 F (36.7 C)  TempSrc: Oral  Oral Oral  SpO2: (!) 89% 90% 95% 91%  Weight:      Height:        Wt Readings from Last 3 Encounters:  06/22/18 29.9 kg  05/29/18 32.3 kg  02/25/18 36.7 kg    No intake or output data in the 24 hours ending 06/24/18 0945   Physical Exam  Extremely frail, elderly, cachectic African-American female, left IJ central [name] in place [place].AT,PERRAL Supple Neck,No JVD, No cervical lymphadenopathy appriciated.  Symmetrical Chest wall movement, Good air movement bilaterally, CTAB RRR,No Gallops, Rubs or new Murmurs, No Parasternal Heave +ve B.Sounds, Abd Soft, No tenderness, No organomegaly appriciated, No rebound - guarding or rigidity. No Cyanosis, Clubbing or edema, No new Rash or bruise     Data Review:    CBC Recent Labs  Lab 06/20/18 1400 06/22/18 0346 06/23/18 0400 06/24/18 0421  WBC 8.7 11.6* 8.6 7.1  HGB 16.7* 13.5 12.3 10.2*  HCT 56.8* 43.4 39.8 32.8*  PLT  126* 124* 107* 104*  MCV 103.8* 101.2* 99.3 98.8  MCH 30.5 31.5 30.7 30.7  MCHC 29.4* 31.1 30.9 31.1  RDW 13.3 13.3 13.2 13.2  LYMPHSABS 0.9  --   --   --   MONOABS 0.5  --   --   --   EOSABS 0.0  --   --   --   BASOSABS 0.0  --   --   --     Chemistries  Recent Labs  Lab 06/20/18 1400   06/21/18 1822 06/22/18 0346  06/22/18 1815 06/23/18 0400 06/23/18 1204 06/23/18 1951 06/24/18 0421  NA 168*   < > 159* 160*   < > 149* 151* 152* 147* 146*  K 4.2   < > 3.3* 3.2*   < > 4.0 3.4* 3.5 4.2 5.0  CL 127*   < > 123* 123*   < > 116* 116* 118* 116* 113*  CO2 25   < > 28 30   < > 26 26 25 24 25   GLUCOSE 113*   < > 236* 178*   < > 184* 87 97 100* 100*  BUN 49*   < > 30* 26*   < > 21 19 19 16 15   CREATININE 1.38*   < > 0.77 0.90   < > 0.73 0.90 0.76 0.72 0.71  CALCIUM 10.2   < > 9.1 9.0   < > 8.7* 8.5* 8.6* 8.2* 5.4*  MG  --   --  2.1 2.0  --   --   --   --   --  1.0*  AST 31  --   --   --   --   --   --   --   --   --   ALT 19  --   --   --   --   --   --   --   --   --   ALKPHOS 66  --   --   --   --   --   --   --   --   --   BILITOT 1.9*  --   --   --   --   --   --   --   --   --    < > = values in this interval not displayed.   ------------------------------------------------------------------------------------------------------------------ No results for input(s): CHOL, HDL, LDLCALC, TRIG, CHOLHDL, LDLDIRECT in the last 72 hours.  Lab Results  Component Value Date   HGBA1C 5.4 05/29/2018   ------------------------------------------------------------------------------------------------------------------ No results for input(s): TSH, T4TOTAL, T3FREE, THYROIDAB in the last 72 hours.  Invalid input(s): FREET3 ------------------------------------------------------------------------------------------------------------------ No results for input(s): VITAMINB12, FOLATE, FERRITIN, TIBC, IRON, RETICCTPCT in the last 72 hours.  Coagulation profile Recent Labs  Lab 06/20/18 1400 06/21/18 1822  INR 1.19 1.42    No results for input(s): DDIMER in the last 72 hours.  Cardiac Enzymes Recent Labs  Lab 06/20/18 1400  TROPONINI 0.05*   ------------------------------------------------------------------------------------------------------------------ No results found for:  BNP  Micro Results No results found for this or any previous visit (from the past 240 hour(s)).  Radiology Reports Ct Head Wo Contrast  Result Date: 06/20/2018 CLINICAL DATA:  Altered level of consciousness, confusion EXAM: CT HEAD WITHOUT CONTRAST TECHNIQUE: Contiguous axial images were obtained from the base of the skull through the vertex without intravenous contrast. COMPARISON:  CT brain of 05/29/2017 and CT brain of 12/16/2015 FINDINGS: Brain: Ventricular dilatation is unchanged compared to the prior  CT as are the prominent cortical sulci, consistent with diffuse atrophy. The septum is in normal midline position. Moderate small vessel ischemic changes again noted throughout the periventricular white matter. No hemorrhage, mass lesion, or acute infarction is seen. Vascular: No vascular abnormality is noted on this unenhanced study. Skull: On bone window images, probable old calcified meningioma is present in the left frontal region and is stable. No acute calvarial abnormality is seen. Sinuses/Orbits: The paranasal sinuses appear well pneumatized. Some extrusion of fat is noted within the right maxillary sinus from the inferior right orbit which was present previously and may be due to prior trauma. Other: None. IMPRESSION: 1. Stable atrophy and moderately severe small vessel ischemic change. No acute intracranial abnormality. 2. Some extrusion of fat is noted from the inferior right lobe into the right maxillary sinus most likely due to prior trauma. This is unchanged compared to the prior CT of 12/16/2015. Electronically Signed   By: Ivar Drape M.D.   On: 06/20/2018 16:13   Ct Head Wo Contrast  Result Date: 05/29/2018 CLINICAL DATA:  Multiple recent falls, the most recent this morning. Unsteady gait. EXAM: CT HEAD WITHOUT CONTRAST TECHNIQUE: Contiguous axial images were obtained from the base of the skull through the vertex without intravenous contrast. COMPARISON:  11/08/2016, 07/23/2016 and  12/16/2015. FINDINGS: Brain: Stable moderate to marked diffuse enlargement of the ventricles and subarachnoid spaces. 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 A small, old, densely calcified left anterior frontal meningioma is unchanged. No intracranial hemorrhage or CT evidence of acute infarction. Vascular: No hyperdense vessel or unexpected calcification. Skull: Normal. Negative for fracture or focal lesion. 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 No acute abnormality. Other: Small bilateral posterior scalp hematomas or areas of scarring. IMPRESSION: 1. No acute abnormality. 2. Stable moderate to marked diffuse cerebral and cerebellar atrophy. 3. 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 4. Stable small, densely calcified left anterior frontal meningioma. 5. 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 Electronically Signed   By: Claudie Revering M.D.   On: 05/29/2018 15:40   Mr Brain Wo Contrast  Result Date: 06/23/2018 CLINICAL DATA:  Somnolence. History of advanced dementia with failure to thrive. EXAM: MRI HEAD WITHOUT CONTRAST TECHNIQUE: Multiplanar, multiecho pulse sequences of the brain and surrounding structures were obtained without intravenous contrast. COMPARISON:  Head CT 06/20/2018 FINDINGS: Brain: There is susceptibility artifact of dental origin which partially obscures the posterior fossa and anterior frontal lobes on diffusion imaging. Within this limitation, no acute infarct is identified. 1 cm markedly T2 hypointense extra-axial signal  focus over the anterior left frontal convexity is unchanged and may represent a heavily calcified meningioma or exostosis, of no clinical significance. No intracranial hemorrhage, midline shift, or extra-axial fluid collection is identified. A small chronic right parietal cortical infarct is new from the prior MRI. A small chronic left occipital infarct is unchanged. There is a chronic lacunar infarct in the left thalamus. Patchy to confluent T2 hyperintensities in the cerebral white matter bilaterally have mildly progressed from 2014 and are nonspecific but compatible with advanced chronic small vessel ischemic disease. Moderate to severe  lateral ventriculomegaly has mildly increased from 2014. The third ventricle is mildly to moderately enlarged, and the cerebral sulci are mildly enlarged diffusely. There is prominent mesial temporal lobe atrophy bilaterally, progressed from 2014. Vascular: Major intracranial vascular flow voids are preserved with the left vertebral artery being dominant. Skull and upper cervical spine: No suspicious marrow lesion. Sinuses/Orbits: Bilateral cataract extraction. Old right orbital floor fracture with herniation of fat into the maxillary sinus, new from 2014. No evidence of inflammatory sinus disease. Other: None. IMPRESSION: 1. No acute intracranial abnormality. 2. Prominent lateral ventriculomegaly, progressed from 2014 and favored to reflect central predominant cerebral atrophy rather than hydrocephalus. There is progressive, severe mesial temporal lobe atrophy. 3. Advanced chronic small vessel ischemic disease. 4. 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 Electronically Signed   By: Logan Bores M.D.   On: 06/23/2018 11:01   Ir Fluoro Guide Cv [name] Left  Result Date: 06/22/2018 INDICATION: 77 year old with hypernatremia and poor venous access. PICC [name] team was unable to place central [name]. EXAM: FLUOROSCOPIC AND ULTRASOUND GUIDED PLACEMENT OF A  CENTRAL VENOUS CATHETER Physician: Stephan Minister. Henn, MD FLUOROSCOPY TIME:  2 minutes and 54 seconds MEDICATIONS: None ANESTHESIA/SEDATION: None PROCEDURE: Informed consent was obtained for a central venous catheter by the family. Patient has advanced dementia and unable to give consent. The patient was placed supine on the interventional table. Ultrasound confirmed a patent left internal jugularvein. Ultrasound images were obtained for documentation. The left neck was prepped and draped in a sterile fashion. The left neck neck was anesthetized with 1% lidocaine. Maximal barrier sterile technique was utilized including caps, mask, sterile gowns, sterile gloves, sterile drape, hand hygiene and skin antiseptic. A 21 gauge needle directed into the left internal jugular vein with ultrasound guidance. A micropuncture dilator set was placed. It was difficult to advance a wire centrally. Eventually, a 0.018 wire was advanced centrally and a peel-away sheath was placed. A dual lumen Power PICC [name] was cut to 17 cm and advanced over the wire in order to keep central venous access. Catheter tip was placed at the SVC and right atrium junction. Both lumens aspirated and flushed well. Both lumens were capped. The catheter was sutured to skin. Fluoroscopic and ultrasound images were taken and saved for documentation. FINDINGS: Right internal jugular vein is enlarged and contains thrombus. Patent left internal jugular vein. Catheter tip at the SVC and right atrium junction. Chronic elevation of the right hemidiaphragm. COMPLICATIONS: None IMPRESSION: Successful placement of left internal jugular central venous catheter. Catheter tip at the SVC and right atrium junction and ready to use. Thrombus in the right internal jugular vein. Right internal jugular vein is enlarged and this thrombus appears to be acute or subacute. Findings discussed with Dr. Candiss Norse immediately following placement of the central venous catheter. Electronically  Signed   By: Markus Daft M.D.   On: 06/22/2018 09:19   Ir US Guide Vasc Access Left  Result Date: 06/22/2018 INDICATION: 77 year old with hypernatremia and poor venous access. PICC [name] team was unable to place central [name]. EXAM: FLUOROSCOPIC AND ULTRASOUND GUIDED PLACEMENT OF A CENTRAL VENOUS CATHETER Physician: Stephan Minister. Henn, MD FLUOROSCOPY TIME:  2 minutes and 54 seconds MEDICATIONS: None ANESTHESIA/SEDATION: None PROCEDURE: Informed consent was obtained for a central venous catheter by the family. Patient has advanced dementia and unable to give consent. The patient was placed supine on the interventional table. Ultrasound confirmed a patent left internal jugularvein. Ultrasound images were obtained for documentation. The left  neck was prepped and draped in a sterile fashion. The left neck neck was anesthetized with 1% lidocaine. Maximal barrier sterile technique was utilized including caps, mask, sterile gowns, sterile gloves, sterile drape, hand hygiene and skin antiseptic. A 21 gauge needle directed into the left internal jugular vein with ultrasound guidance. A micropuncture dilator set was placed. It was difficult to advance a wire centrally. Eventually, a 0.018 wire was advanced centrally and a peel-away sheath was placed. A dual lumen Power PICC [name] was cut to 17 cm and advanced over the wire in order to keep central venous access. Catheter tip was placed at the SVC and right atrium junction. Both lumens aspirated and flushed well. Both lumens were capped. The catheter was sutured to skin. Fluoroscopic and ultrasound images were taken and saved for documentation. FINDINGS: Right internal jugular vein is enlarged and contains thrombus. Patent left internal jugular vein. Catheter tip at the SVC and right atrium junction. Chronic elevation of the right hemidiaphragm. COMPLICATIONS: None IMPRESSION: Successful placement of left internal jugular central venous catheter. Catheter tip at the SVC and right  atrium junction and ready to use. Thrombus in the right internal jugular vein. Right internal jugular vein is enlarged and this thrombus appears to be acute or subacute. Findings discussed with Dr. Candiss Norse immediately following placement of the central venous catheter. Electronically Signed   By: Markus Daft M.D.   On: 06/22/2018 09:19   Dg Chest Portable 1 View  Result Date: 06/20/2018 CLINICAL DATA:  Hypertension.  Altered mental status EXAM: PORTABLE CHEST 1 VIEW COMPARISON:  June 17, 2013 FINDINGS: There is elevation of the right hemidiaphragm with mild right base atelectasis. There is a small calcified granuloma in the left upper lobe. There is no edema or consolidation. The heart size and pulmonary vascularity are normal. There is aortic atherosclerosis. No adenopathy. No bone lesions. IMPRESSION: Stable elevation right hemidiaphragm with right base atelectasis. No edema or consolidation. Small granuloma left upper lobe. Stable cardiac silhouette. There is aortic atherosclerosis. Aortic Atherosclerosis (ICD10-I70.0). Electronically Signed   By: Lowella Grip III M.D.   On: 06/20/2018 14:58   Vas US Carotid  Result Date: 06/24/2018 Carotid Arterial Duplex Study Indications:       TIA. Other Factors:     Parkinson Disease; Dementia. Limitations:       Patient is in deep somnolent with multiple lines on sites,                    immobility, stiffness of the neck and the whole                    body,inability to cooperate the exam. Comparison Study:  No prior study available. Performing Technologist: Rudell Cobb  Examination Guidelines: A complete evaluation includes B-mode imaging, spectral Doppler, color Doppler, and power Doppler as needed of all accessible portions of each vessel. Bilateral testing is considered an integral part of a complete examination. Limited examinations for reoccurring indications may be performed as noted.  Right Carotid Findings:  +----------+--------+--------+--------+--------+--------+           PSV cm/sEDV cm/sStenosisDescribeComments +----------+--------+--------+--------+--------+--------+ CCA Prox  73      12                               +----------+--------+--------+--------+--------+--------+ CCA Distal63      11                               +----------+--------+--------+--------+--------+--------+  ICA Prox  81      11      1-39%           tortuous +----------+--------+--------+--------+--------+--------+ ICA Distal42      14                               +----------+--------+--------+--------+--------+--------+ ECA       74      13                               +----------+--------+--------+--------+--------+--------+ +----------+--------+-------+--------+-------------------+           PSV cm/sEDV cmsDescribeArm Pressure (mmHG) +----------+--------+-------+--------+-------------------+ Subclavian108                                        +----------+--------+-------+--------+-------------------+ +---------+--------+--+--------+--+----------------------------+ VertebralPSV cm/s47EDV cm/s10Antegrade and High resistant +---------+--------+--+--------+--+----------------------------+  Left Carotid Findings: +----------+--------+--------+--------+-------------------+ SubclavianPSV cm/sEDV cm/sDescribeArm Pressure (mmHG) +----------+--------+--------+--------+-------------------+           37                                          +----------+--------+--------+--------+-------------------+ Unable to approach due to multiple lines and PICC on site.  Summary: Right Carotid: Velocities in the right ICA are consistent with a 1-39% stenosis. Vertebrals: Right vertebral artery demonstrates antegrade flow. *See table(s) above for measurements and observations.  Electronically signed by Deitra Mayo MD on 06/24/2018 at 6:39:32 AM.    Final    Vas [place] Upper  Extremity Venous Duplex  Result Date: 06/24/2018 UPPER VENOUS STUDY  Indications: Swelling Risk Factors: Radiation therapy received. Cancer Breast cancer. Limitations: Body habitus, [name] and PICC and multiple lines on sites; deep somnolent, inability to move, stiffness of the whole body. Unable to move arms. Comparison Study: No comparison study available. Performing Technologist: Rudell Cobb  Examination Guidelines: A complete evaluation includes B-mode imaging, spectral Doppler, color Doppler, and power Doppler as needed of all accessible portions of each vessel. Bilateral testing is considered an integral part of a complete examination. Limited examinations for reoccurring indications may be performed as noted.  Right Findings: +----------+------------+----------+---------+-----------+--------------+ RIGHT     CompressiblePropertiesPhasicitySpontaneous   Summary     +----------+------------+----------+---------+-----------+--------------+ IJV           None                 No        No         Acute      +----------+------------+----------+---------+-----------+--------------+ Subclavian    None                 No        No         Acute      +----------+------------+----------+---------+-----------+--------------+ Axillary      None                                      Acute      +----------+------------+----------+---------+-----------+--------------+ Brachial      Full  Yes                   +----------+------------+----------+---------+-----------+--------------+ Radial                                              Not visualized +----------+------------+----------+---------+-----------+--------------+ Ulnar                                               Not visualized +----------+------------+----------+---------+-----------+--------------+ Cephalic      Full                                                  +----------+------------+----------+---------+-----------+--------------+ Basilic                                             Not visualized +----------+------------+----------+---------+-----------+--------------+  Left Findings: +----------+------------+----------+---------+-----------+--------------+ LEFT      CompressiblePropertiesPhasicitySpontaneous   Summary     +----------+------------+----------+---------+-----------+--------------+ IJV           Full                 Yes       Yes                   +----------+------------+----------+---------+-----------+--------------+ Subclavian    Full                 Yes       Yes                   +----------+------------+----------+---------+-----------+--------------+ Axillary      Full                           Yes                   +----------+------------+----------+---------+-----------+--------------+ Brachial      Full                           Yes                   +----------+------------+----------+---------+-----------+--------------+ Radial                                              Not visualized +----------+------------+----------+---------+-----------+--------------+ Ulnar                                               Not visualized +----------+------------+----------+---------+-----------+--------------+ Cephalic                                            Not visualized +----------+------------+----------+---------+-----------+--------------+ Basilic  Not visualized +----------+------------+----------+---------+-----------+--------------+  Summary:  Right: Findings consistent with acute deep vein thrombosis involving the right internal jugular veins, right subclavian veins and right axillary vein.  Left: This was a limited study.  *See table(s) above for measurements and observations.  Diagnosing physician: Deitra Mayo MD  Electronically signed by Deitra Mayo MD on 06/24/2018 at 6:39:26 AM.    Final    [place] Ekg Site Rite  Result Date: 06/21/2018 If Site Rite image not attached, placement could not be confirmed due to current cardiac rhythm.   Time Spent in minutes  30   Lala Lund M.D on 06/24/2018 at 9:45 AM  To page go to [email or web address] - password [facility]

## 2018-06-24 NOTE — Progress Notes (Signed)
CRITICAL VALUE ALERT  Critical Value: Ca 5.4  Date & Time Notied:  06/24/2018  Provider Notified: on call  Orders Received/Actions taken: awaiting

## 2018-06-24 NOTE — Care Management Note (Addendum)
Case Management Note  Patient Details  Name: [name] MRN: [id number] Date of Birth: [birth year]  Subjective/Objective:                 Failure to thrive, will be initiating hospice care   Action/Plan:  Spoke w patient's daughter [name] on the phone. She states they are active for palliative care through [facility], mentioned [name] as their CSW. Would like to continue home hospice services through [facility]. Patient currently lives at [place] w spouse. Daughter states that her dad, herself, and her brother will provide 24 hour care for patient.  Referral placed to [facility]. Daughter states patient would benefit from hospital bed with bedside table. Orders placed. Patient currently connected to supplemental O2, [name] states she does not need an order for O2 for home hospice use, is aware patient is currently on it.  Patient will require PTAR transport home. Gold DNR form is on chart. Anticipate DC tomorrow. IF DC's tomorrow, first visit will be Thursday.   Lovenox benefit check resulted as needing PA. Notified [name] who stated that she would look into if [facility] needed it to be done or not as patient is [place] w hospice.  Dr Candiss Norse made aware and will DC with Eliquis. Card on front of chart, spoke w daughter and understands it will be sent home with PTAR papers if she does not come to hospital to get it. Benefit check pending.  .  Expected Discharge Date:                  Expected Discharge Plan:  Home w Hospice Care  In-House Referral:     Discharge planning Services  CM Consult  Post Acute Care Choice:    Choice offered to:     DME Arranged:    DME Agency:     HH Arranged:    [place] Agency:  [facility]  Status of Service:  In process, will continue to follow  If discussed at Long Length of Stay Meetings, dates discussed:    Additional Comments:  Carles Collet, RN 06/24/2018, 2:20 PM

## 2018-06-24 NOTE — Care Management (Signed)
#  8.  S/W   [facility]  @ [facility]  RX # [phone]  ELIQUIS  5 MG BID COVER- YES CO-PAY- $ 24.00   STARTING 06/25/2018 PRICE WILL VARY TIER- 2 DRUG PRIOR APPROVAL- NO  NO DEDUCTIBLE OUT-OF-POCKET: NOT MET  PREFERRED PHARMACY : YES -  [facility]

## 2018-06-24 NOTE — Care Management (Signed)
#     S/W  [facility] @ [employer] # [phone]   1.LOVENOX  SYRINGES  40 MG DAILY COVER-  NOT COVER PRIOR APPROVAL- YES # [phone]  2. ENOXAPARIN  SYRINGES 40 MG  DAILY UNABLE TO GIVE AND  ESTIMATE CO-PAY PRIOR APPROVAL- YES # [phone]  PREFERRED PHARMACY :  YES [facility]

## 2018-06-24 NOTE — Progress Notes (Signed)
Notified by Nurse tech that patient exhibited seizure like activity with uncontrollable shaking for a approximately 1 minute. Doctor notified.

## 2018-06-24 NOTE — Progress Notes (Addendum)
ANTICOAGULATION CONSULT NOTE - Initial Consult  Pharmacy Consult for Lovenox>>Apixaban Indication: DVT  No Known Allergies  Patient Measurements: Height: 5\' 2"  (157.5 cm) Weight: 65 lb 14.7 oz (29.9 kg) IBW/kg (Calculated) : 50.1  Vital Signs: Temp: 98 F (36.7 C) (12/31 0513) Temp Source: Oral (12/31 0513) BP: 109/60 (12/31 0513) Pulse Rate: 63 (12/31 0513)  Labs: Recent Labs    06/21/18 1822  06/22/18 0346  06/23/18 0400  06/23/18 1951 06/24/18 0421 06/24/18 1033  HGB  --    < > 13.5  --  12.3  --   --  10.2*  --   HCT  --   --  43.4  --  39.8  --   --  32.8*  --   PLT  --   --  124*  --  107*  --   --  104*  --   LABPROT 17.2*  --   --   --   --   --   --   --   --   INR 1.42  --   --   --   --   --   --   --   --   HEPRLOWMOCWT  --   --   --   --   --   --   --   --  0.21  CREATININE 0.77  --  0.90   < > 0.90   < > 0.72 0.71 0.62   < > = values in this interval not displayed.    Estimated Creatinine Clearance: 26.9 mL/min (by C-G formula based on SCr of 0.62 mg/dL).   Medical History: Past Medical History:  Diagnosis Date  . Abnormal chest x-ray   . Anxiety   . Breast cancer ([place])    stage 0 left  . Diverticulosis of colon   . DJD (degenerative joint disease)   . Glaucoma   . Hip pain   . Hypercholesteremia   . Hypertension   . Hypothyroid   . Lumbar back pain   . Osteopenia   . Parkinson disease ([place])   . Peripheral vascular disease ([place])   . Venous insufficiency   . Vitamin D deficiency     Medications:  Medications Prior to Admission  Medication Sig Dispense Refill Last Dose  . carbidopa-levodopa-entacapone (STALEVO) 25-100-200 MG tablet Take 1 tablet by mouth 3 (three) times daily. (Patient taking differently: Take 1 tablet by mouth 2 (two) times daily. ) 270 tablet 4 06/20/2018 at 800  . Cholecalciferol ([facility] D3) 2000 units CAPS Take 1 capsule (2,000 Units total) by mouth daily. 90 capsule 3 06/20/2018 at am  . levothyroxine (SYNTHROID,  LEVOTHROID) 50 MCG tablet Take 1 tablet (50 mcg total) by mouth daily before breakfast. 90 tablet 1 06/20/2018 at am  . Brinzolamide-Brimonidine (SIMBRINZA) 1-0.2 % SUSP Place 1 drop into both eyes 2 (two) times daily.   06/16/2018 at pm  . latanoprost (XALATAN) 0.005 % ophthalmic solution Place 1 drop into both eyes at bedtime.    06/16/2018 at pm   Scheduled:  . aspirin  300 mg Rectal Daily  . entacapone  200 mg Oral BID   And  . carbidopa-levodopa  1 tablet Oral BID  . enoxaparin (LOVENOX) injection  30 mg Subcutaneous Q24H  . latanoprost  1 drop Both Eyes QHS  . levothyroxine  25 mcg Intravenous Daily   Infusions:  . dextrose 75 mL/hr at 06/24/18 9767    Assessment: 77yo female admitted 12/27 for  dehydration and hypernatremia, now w/ confirmed DVT involving right IJV, subclavian. Due to her wt and age, she has been on lovenox 30mg  qday. A level was drawn today and it came back at 0.21. We will adjust the dose accordingly. Due to her limited survivability, we will not repeat any levels.   Due to insurance coverage issue. Will use use low dose apixaban instead.   Goal of Therapy:  Anti-Xa level 0.6-1 units/ml 4hrs after LMWH dose given Monitor platelets by anticoagulation protocol: Yes   Plan:   Dc lovenox  Apixaban 2.5mg  BID  Onnie Boer, PharmD, BCIDP, AAHIVP, CPP Infectious Disease Pharmacist 06/24/2018 12:30 PM

## 2018-06-24 NOTE — Progress Notes (Signed)
[facility] Liaison: RN note.   Notified by Samara Snide of patient/family request for [facility] services at home after discharge. Chart and patient information under review by [facility] physician. Hospice eligibility pending at this time.   Writer spoke with Daughter Malachy Mood  at  to initiate education related to hospice philosophy, services and team approach to care. Malachy Mood verbalized understanding of information given. Per discussion, plan is for discharge to home by [facility] 06/25/2018.   Please send signed and completed DNR form home with patient/family. Patient will need prescriptions for discharge comfort medications.   DME needs have been discussed, patient currently has the following equipment in the home:  W/C, Walker .  Patient/family requests the following DME for delivery to the home: hospital bed, Oxygen. [facility] equipment manager has been notified and will contact [place] to arrange delivery to the home. Home address has been verified and is correct in the chart. Malachy Mood is the family member to contact to arrange time of delivery.   [facility] Referral Center aware of the above. Please notify [facility] when patient is ready to leave the unit at discharge. (Call [phone] or [phone] after 5pm.) [facility] information and contact numbers given to daughter, Malachy Mood. Above information shared with  Jackelyn Poling, [facility].   Please call with any hospice related questions.   Thank you for this referral.   Farrel Gordon, RN, [facility] Liaison [phone] ? Hospital liaisons are now on No Name.

## 2018-06-24 NOTE — Progress Notes (Signed)
[facility])  [name] has been approved by Dr. Lyman Speller for [facility] hospice services at home after discharge.  Thank you, Venia Carbon BSN, [facility] Liaison (listed in [place]) [phone]

## 2018-06-24 NOTE — Progress Notes (Signed)
CSW notes patient will be discharging home with hospice per family request. CSW signing off. Please re-consult if needed.   Percell Locus Warnie Belair LCSW [phone]

## 2018-06-25 DIAGNOSIS — Z515 Encounter for palliative care: Secondary | ICD-10-CM

## 2018-06-25 LAB — CBC
HCT: 32.3 % — ABNORMAL LOW (ref 36.0–46.0)
Hemoglobin: 10.1 g/dL — ABNORMAL LOW (ref 12.0–15.0)
MCH: 30.2 pg (ref 26.0–34.0)
MCHC: 31.3 g/dL (ref 30.0–36.0)
MCV: 96.7 fL (ref 80.0–100.0)
Platelets: 112 10*3/uL — ABNORMAL LOW (ref 150–400)
RBC: 3.34 MIL/uL — ABNORMAL LOW (ref 3.87–5.11)
RDW: 12.9 % (ref 11.5–15.5)
WBC: 6.6 10*3/uL (ref 4.0–10.5)
nRBC: 0 % (ref 0.0–0.2)

## 2018-06-25 LAB — COMPREHENSIVE METABOLIC PANEL
ALT: 11 U/L (ref 0–44)
AST: 20 U/L (ref 15–41)
Albumin: 1.9 g/dL — ABNORMAL LOW (ref 3.5–5.0)
Alkaline Phosphatase: 55 U/L (ref 38–126)
Anion gap: 3 — ABNORMAL LOW (ref 5–15)
BUN: 12 mg/dL (ref 8–23)
CO2: 28 mmol/L (ref 22–32)
CREATININE: 0.62 mg/dL (ref 0.44–1.00)
Calcium: 8.5 mg/dL — ABNORMAL LOW (ref 8.9–10.3)
Chloride: 110 mmol/L (ref 98–111)
GFR calc Af Amer: >60 mL/min (ref >60)
GFR calc non Af Amer: >60 mL/min (ref >60)
Glucose, Bld: 71 mg/dL (ref 70–99)
Potassium: 3.3 mmol/L — ABNORMAL LOW (ref 3.5–5.1)
Sodium: 141 mmol/L (ref 135–145)
Total Bilirubin: 0.7 mg/dL (ref 0.3–1.2)
Total Protein: 4.7 g/dL — ABNORMAL LOW (ref 6.5–8.1)

## 2018-06-25 MED ORDER — APIXABAN 2.5 MG PO TABS: 2.5000 mg | ORAL_TABLET | Freq: Two times a day (BID) | ORAL | 0 refills | Status: AC

## 2018-06-25 MED ORDER — POTASSIUM CHLORIDE 10 MEQ/100ML IV SOLN
10.0000 meq | INTRAVENOUS | Status: AC
  Administered 2018-06-25 (×3): 10 meq via INTRAVENOUS
  Filled 2018-06-25 (×3): qty 100

## 2018-06-25 MED ORDER — MAGNESIUM SULFATE 2 GM/50ML IV SOLN
2.0000 g | Freq: Once | INTRAVENOUS | Status: AC
  Administered 2018-06-25: 2 g via INTRAVENOUS
  Filled 2018-06-25: qty 50

## 2018-06-25 NOTE — Progress Notes (Signed)
CVC removed per order. Vaseline gauze/gauze dressing placed and is clean, dry, and intact. RN aware for patient to be on bedrest for 30 minutes, until 1325 and to let family know to leave the dressing on and not get it wet for 24 hours.

## 2018-06-25 NOTE — Discharge Summary (Signed)
[name] ZOX:[id number] DOB: 06/10/1941 DOA: 06/20/2018  PCP: Janith Lima, MD  Admit date: 06/20/2018  Discharge date: 06/25/2018  Admitted From: Home   Disposition:  Home with Hospice   Recommendations for Outpatient Follow-up:   Follow up with PCP in 1-2 weeks  PCP Please obtain BMP/CBC, 2 view CXR in 1week,  (see Discharge instructions)   PCP Please follow up on the following pending results:     Home Health: Home Hospice   Equipment/Devices: None  Consultations: Pall.Care, IR Discharge Condition: Guarded   CODE STATUS: DNR   Diet Recommendation: For comfort only, soft diet with feeding assistance and aspiration precautions.  Diet Order    None       Chief Complaint  Patient presents with  . Altered Mental Status     Brief history of present illness from the day of admission and additional interim summary    [name] a 78 y.o.femalewithpastmedical history significant forParkinson disease, advanced dementia, failure to thrive, hypothyroidism, who presented to [facility] ED from home where she lives with her husband due to somnolence with difficulty to arouse of 1 day duration, work-up here showed extremely cachectic weak elderly African-American female with severe dehydration and hypernatremia.  CT head and UA unremarkable and she was admitted for further treatment.                                                                 Hospital Course     1.  Extreme dehydration and hypernatremia due to poor oral intake which is happening from advanced Parkinson's in a patient with terminal decline.  Discussed with daughter and husband in detail, they unfortunately have unrealistic expectations, they want to pursue aggressive measures and want to keep the patient full code.  To me patient  appears to be in terminal decline with severe cachexia, dehydration, protein calorie malnutrition.  She likely has been in this decline for the last several months if not years, she weighs 6 kg's.  She has been started on IV fluids with gradual reduction in hyponatremia, CT head and MRI brain have been nonacute, family tells me that patient was quite functional till 1 week ago which I highly doubt, speech therapy was following, clinically not improved and I think her long-term prognosis is extremely poor, patient finally met with palliative care and fortunately decided towards residential hospice and DNR.  Goal of care will be comfort measures and comfort directed.  Medications orally as tolerated.     2.  Advanced Parkinson's.  Supportive care.  Appears to be in terminal decline.  3.  Cachexia, severe protein calorie malnutrition, failure to thrive.  Seen by speech, placed on ice chips, once taking oral diet will add protein supplementation.  This seems to be a terminal decline process.  4.  Hypothyroidism.  Synthroid switched to IV as TSH is elevated.  Will monitor.  5.  Right IJ, subclavian and axillary vein clot found incidentally during central line placement by IR- placed on low-dose Eliquis at least for 3 to 6 months.  Family understands the risks and benefits.          Discharge diagnosis     Active Problems:   Dehydration with hypernatremia   Altered mental status   Hypernatremia   Palliative care by specialist   Advanced care planning/counseling discussion   Parkinson disease [facility])    Discharge instructions    Discharge Instructions    Discharge instructions   Complete by:  As directed    Disposition.  Residential hospice Condition.  Guarded CODE STATUS.  DNR Activity.  With assistance as tolerated, full fall precautions. Diet.  Soft with feeding assistance and aspiration precautions. Goal of care.  Comfort.      Discharge Medications   Allergies as of  06/25/2018   No Known Allergies     Medication List    TAKE these medications   apixaban 2.5 MG Tabs tablet Commonly known as:  ELIQUIS Take 1 tablet (2.5 mg total) by mouth 2 (two) times daily.   carbidopa-levodopa-entacapone 25-100-200 MG tablet Commonly known as:  STALEVO Take 1 tablet by mouth 3 (three) times daily. What changed:  when to take this   Cholecalciferol 50 MCG (2000 UT) Caps Commonly known as:  [employer] D3 Take 1 capsule (2,000 Units total) by mouth daily.   levothyroxine 50 MCG tablet Commonly known as:  SYNTHROID, LEVOTHROID Take 1 tablet (50 mcg total) by mouth daily before breakfast.   SIMBRINZA 1-0.2 % Susp Generic drug:  Brinzolamide-Brimonidine Place 1 drop into both eyes 2 (two) times daily.   XALATAN 0.005 % ophthalmic solution Generic drug:  latanoprost Place 1 drop into both eyes at bedtime.            Durable Medical Equipment  (From admission, onward)         Start     Ordered   06/24/18 1419  For home use only DME Hospital bed  Once    Comments:  With bedside table  Question Answer Comment  Patient has (list medical condition): dehydration, failure to thrive   The above medical condition requires: Patient requires the ability to reposition frequently   Head must be elevated greater than: 30 degrees   Bed type Semi-electric      06/24/18 1419          Follow-up Information    Janith Lima, MD. Schedule an appointment as soon as possible for a visit in 1 week(s).   Specialty:  Internal Medicine Contact information: [address]LO[address] [place] [address] [phone]           Major procedures and Radiology Reports - PLEASE review detailed and final reports thoroughly  -        Ct Head Wo Contrast  Result Date: 06/20/2018 CLINICAL DATA:  Altered level of consciousness, confusion EXAM: CT HEAD WITHOUT CONTRAST TECHNIQUE: Contiguous axial images were obtained from the base of the skull through the vertex  without intravenous contrast. COMPARISON:  CT brain of 05/29/2017 and CT brain of 12/16/2015 FINDINGS: Brain: Ventricular dilatation is unchanged compared to the prior CT as are the prominent cortical sulci, consistent with diffuse atrophy. The septum is in normal midline position. Moderate small vessel ischemic changes again noted throughout the periventricular white matter. No hemorrhage,  mass lesion, or acute infarction is seen. Vascular: No vascular abnormality is noted on this unenhanced study. Skull: On bone window images, probable old calcified meningioma is present in the left frontal region and is stable. No acute calvarial abnormality is seen. Sinuses/Orbits: The paranasal sinuses appear well pneumatized. Some extrusion of fat is noted within the right maxillary sinus from the inferior right orbit which was present previously and may be due to prior trauma. Other: None. IMPRESSION: 1. Stable atrophy and moderately severe small vessel ischemic change. No acute intracranial abnormality. 2. Some extrusion of fat is noted from the inferior right lobe into the right maxillary sinus most likely due to prior trauma. This is unchanged compared to the prior CT of 12/16/2015. Electronically Signed   By: Ivar Drape M.D.   On: 06/20/2018 16:13   Ct Head Wo Contrast  Result Date: 05/29/2018 CLINICAL DATA:  Multiple recent falls, the most recent this morning. Unsteady gait. EXAM: CT HEAD WITHOUT CONTRAST TECHNIQUE: Contiguous axial images were obtained from the base of the skull through the vertex without intravenous contrast. COMPARISON:  11/08/2016, 07/23/2016 and 12/16/2015. FINDINGS: Brain: Stable moderate to marked diffuse enlargement of the ventricles and subarachnoid spaces. 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 A small, old, densely calcified left anterior frontal meningioma is unchanged. No  intracranial hemorrhage or CT evidence of acute infarction. Vascular: No hyperdense vessel or unexpected calcification. Skull: Normal. Negative for fracture or focal lesion. 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 No acute abnormality. Other: Small bilateral posterior scalp hematomas or areas of scarring. IMPRESSION: 1. No acute abnormality. 2. Stable moderate to marked diffuse cerebral and cerebellar atrophy. 3. 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 4. Stable small, densely calcified left anterior frontal meningioma. 5. 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 Electronically Signed   By: Claudie Revering M.D.   On: 05/29/2018 15:40   Mr Brain Wo Contrast  Result Date: 06/23/2018 CLINICAL DATA:  Somnolence. History of advanced dementia with failure to thrive. EXAM: MRI HEAD WITHOUT CONTRAST TECHNIQUE: Multiplanar, multiecho pulse sequences of the brain and surrounding structures were obtained without intravenous contrast. COMPARISON:  Head CT 06/20/2018 FINDINGS: Brain: There is susceptibility artifact of dental origin which partially obscures the posterior fossa and anterior frontal lobes on diffusion imaging. Within this limitation, no acute infarct is identified. 1 cm markedly T2 hypointense extra-axial signal focus over the anterior left frontal convexity is unchanged and may represent a heavily calcified meningioma or exostosis, of no clinical significance. No intracranial hemorrhage, midline shift, or extra-axial fluid collection is identified. A small chronic right parietal cortical infarct is new from the prior MRI. A small chronic left occipital  infarct is unchanged. There is a chronic lacunar infarct in the left thalamus. Patchy to confluent T2 hyperintensities in the cerebral white matter bilaterally have mildly progressed from 2014 and are nonspecific but compatible with advanced chronic small vessel ischemic disease. Moderate to severe lateral ventriculomegaly has mildly increased from 2014. The third ventricle is mildly to moderately enlarged, and the cerebral sulci are mildly enlarged diffusely. There is prominent mesial temporal lobe atrophy bilaterally, progressed  from 2014. Vascular: Major intracranial vascular flow voids are preserved with the left vertebral artery being dominant. Skull and upper cervical spine: No suspicious marrow lesion. Sinuses/Orbits: Bilateral cataract extraction. Old right orbital floor fracture with herniation of fat into the maxillary sinus, new from 2014. No evidence of inflammatory sinus disease. Other: None. IMPRESSION: 1. No acute intracranial abnormality. 2. Prominent lateral ventriculomegaly, progressed from 2014 and favored to reflect central predominant cerebral atrophy rather than hydrocephalus. There is progressive, severe mesial temporal lobe atrophy. 3. Advanced chronic small vessel ischemic disease. 4. 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 Electronically Signed   By: Logan Bores M.D.   On: 06/23/2018 11:01   Ir Fluoro Guide Cv Line Left  Result Date: 06/22/2018 INDICATION: 78 year old with hypernatremia and poor venous access. PICC line team was unable to place central line. EXAM: FLUOROSCOPIC AND ULTRASOUND GUIDED PLACEMENT OF A CENTRAL VENOUS CATHETER Physician: Stephan Minister. Henn, MD FLUOROSCOPY TIME:  2 minutes and 54 seconds MEDICATIONS: None ANESTHESIA/SEDATION: None PROCEDURE: Informed consent was obtained for a central venous catheter by the family. Patient has advanced dementia and unable to give consent. The patient was placed supine on the interventional table.  Ultrasound confirmed a patent left internal jugularvein. Ultrasound images were obtained for documentation. The left neck was prepped and draped in a sterile fashion. The left neck neck was anesthetized with 1% lidocaine. Maximal barrier sterile technique was utilized including caps, mask, sterile gowns, sterile gloves, sterile drape, hand hygiene and skin antiseptic. A 21 gauge needle directed into the left internal jugular vein with ultrasound guidance. A micropuncture dilator set was placed. It was difficult to advance a wire centrally. Eventually, a 0.018 wire was advanced centrally and a peel-away sheath was placed. A dual lumen Power PICC line was cut to 17 cm and advanced over the wire in order to keep central venous access. Catheter tip was placed at the SVC and right atrium junction. Both lumens aspirated and flushed well. Both lumens were capped. The catheter was sutured to skin. Fluoroscopic and ultrasound images were taken and saved for documentation. FINDINGS: Right internal jugular vein is enlarged and contains thrombus. Patent left internal jugular vein. Catheter tip at the SVC and right atrium junction. Chronic elevation of the right hemidiaphragm. COMPLICATIONS: None IMPRESSION: Successful placement of left internal jugular central venous catheter. Catheter tip at the SVC and right atrium junction and ready to use. Thrombus in the right internal jugular vein. Right internal jugular vein is enlarged and this thrombus appears to be acute or subacute. Findings discussed with Dr. Candiss Norse immediately following placement of the central venous catheter. Electronically Signed   By: Markus Daft M.D.   On: 06/22/2018 09:19   Ir US Guide Vasc Access Left  Result Date: 06/22/2018 INDICATION: 78 year old with hypernatremia and poor venous access. PICC line team was unable to place central line. EXAM: FLUOROSCOPIC AND ULTRASOUND GUIDED PLACEMENT OF A CENTRAL VENOUS CATHETER Physician: Stephan Minister. Henn, MD  FLUOROSCOPY TIME:  2 minutes and 54 seconds MEDICATIONS: None ANESTHESIA/SEDATION: None PROCEDURE: Informed consent was obtained for a central venous catheter by the family. Patient has advanced dementia and unable to give consent. The patient was placed supine on the interventional table. Ultrasound confirmed a patent left internal jugularvein. Ultrasound images were obtained for documentation. The left neck was prepped and draped in a sterile fashion. The left neck neck was anesthetized with 1% lidocaine. Maximal barrier sterile technique was utilized including caps, mask, sterile gowns, sterile gloves, sterile  drape, hand hygiene and skin antiseptic. A 21 gauge needle directed into the left internal jugular vein with ultrasound guidance. A micropuncture dilator set was placed. It was difficult to advance a wire centrally. Eventually, a 0.018 wire was advanced centrally and a peel-away sheath was placed. A dual lumen Power PICC line was cut to 17 cm and advanced over the wire in order to keep central venous access. Catheter tip was placed at the SVC and right atrium junction. Both lumens aspirated and flushed well. Both lumens were capped. The catheter was sutured to skin. Fluoroscopic and ultrasound images were taken and saved for documentation. FINDINGS: Right internal jugular vein is enlarged and contains thrombus. Patent left internal jugular vein. Catheter tip at the SVC and right atrium junction. Chronic elevation of the right hemidiaphragm. COMPLICATIONS: None IMPRESSION: Successful placement of left internal jugular central venous catheter. Catheter tip at the SVC and right atrium junction and ready to use. Thrombus in the right internal jugular vein. Right internal jugular vein is enlarged and this thrombus appears to be acute or subacute. Findings discussed with Dr. Candiss Norse immediately following placement of the central venous catheter. Electronically Signed   By: Markus Daft M.D.   On: 06/22/2018 09:19    Dg Chest Portable 1 View  Result Date: 06/20/2018 CLINICAL DATA:  Hypertension.  Altered mental status EXAM: PORTABLE CHEST 1 VIEW COMPARISON:  June 17, 2013 FINDINGS: There is elevation of the right hemidiaphragm with mild right base atelectasis. There is a small calcified granuloma in the left upper lobe. There is no edema or consolidation. The heart size and pulmonary vascularity are normal. There is aortic atherosclerosis. No adenopathy. No bone lesions. IMPRESSION: Stable elevation right hemidiaphragm with right base atelectasis. No edema or consolidation. Small granuloma left upper lobe. Stable cardiac silhouette. There is aortic atherosclerosis. Aortic Atherosclerosis (ICD10-I70.0). Electronically Signed   By: Lowella Grip III M.D.   On: 06/20/2018 14:58   Vas US Carotid  Result Date: 06/24/2018 Carotid Arterial Duplex Study Indications:       TIA. Other Factors:     Parkinson Disease; Dementia. Limitations:       Patient is in deep somnolent with multiple lines on sites,                    immobility, stiffness of the neck and the whole                    body,inability to cooperate the exam. Comparison Study:  No prior study available. Performing Technologist: Rudell Cobb  Examination Guidelines: A complete evaluation includes B-mode imaging, spectral Doppler, color Doppler, and power Doppler as needed of all accessible portions of each vessel. Bilateral testing is considered an integral part of a complete examination. Limited examinations for reoccurring indications may be performed as noted.  Right Carotid Findings: +----------+--------+--------+--------+--------+--------+           PSV cm/sEDV cm/sStenosisDescribeComments +----------+--------+--------+--------+--------+--------+ CCA Prox  73      12                               +----------+--------+--------+--------+--------+--------+ CCA Distal63      11                                +----------+--------+--------+--------+--------+--------+ ICA Prox  81      11  1-39%           tortuous +----------+--------+--------+--------+--------+--------+ ICA Distal42      14                               +----------+--------+--------+--------+--------+--------+ ECA       74      13                               +----------+--------+--------+--------+--------+--------+ +----------+--------+-------+--------+-------------------+           PSV cm/sEDV cmsDescribeArm Pressure (mmHG) +----------+--------+-------+--------+-------------------+ Subclavian108                                        +----------+--------+-------+--------+-------------------+ +---------+--------+--+--------+--+----------------------------+ VertebralPSV cm/s47EDV cm/s10Antegrade and High resistant +---------+--------+--+--------+--+----------------------------+  Left Carotid Findings: +----------+--------+--------+--------+-------------------+ SubclavianPSV cm/sEDV cm/sDescribeArm Pressure (mmHG) +----------+--------+--------+--------+-------------------+           37                                          +----------+--------+--------+--------+-------------------+ Unable to approach due to multiple lines and PICC on site.  Summary: Right Carotid: Velocities in the right ICA are consistent with a 1-39% stenosis. Vertebrals: Right vertebral artery demonstrates antegrade flow. *See table(s) above for measurements and observations.  Electronically signed by Deitra Mayo MD on 06/24/2018 at 6:39:32 AM.    Final    Vas [place] Upper Extremity Venous Duplex  Result Date: 06/24/2018 UPPER VENOUS STUDY  Indications: Swelling Risk Factors: Radiation therapy received. Cancer Breast cancer. Limitations: Body habitus, line and PICC and multiple lines on sites; deep somnolent, inability to move, stiffness of the whole body. Unable to move arms. Comparison Study: No comparison study  available. Performing Technologist: Rudell Cobb  Examination Guidelines: A complete evaluation includes B-mode imaging, spectral Doppler, color Doppler, and power Doppler as needed of all accessible portions of each vessel. Bilateral testing is considered an integral part of a complete examination. Limited examinations for reoccurring indications may be performed as noted.  Right Findings: +----------+------------+----------+---------+-----------+--------------+ RIGHT     CompressiblePropertiesPhasicitySpontaneous   Summary     +----------+------------+----------+---------+-----------+--------------+ IJV           None                 No        No         Acute      +----------+------------+----------+---------+-----------+--------------+ Subclavian    None                 No        No         Acute      +----------+------------+----------+---------+-----------+--------------+ Axillary      None                                      Acute      +----------+------------+----------+---------+-----------+--------------+ Brachial      Full                           Yes                   +----------+------------+----------+---------+-----------+--------------+  Radial                                              Not visualized +----------+------------+----------+---------+-----------+--------------+ Ulnar                                               Not visualized +----------+------------+----------+---------+-----------+--------------+ Cephalic      Full                                                 +----------+------------+----------+---------+-----------+--------------+ Basilic                                             Not visualized +----------+------------+----------+---------+-----------+--------------+  Left Findings: +----------+------------+----------+---------+-----------+--------------+ LEFT      CompressiblePropertiesPhasicitySpontaneous    Summary     +----------+------------+----------+---------+-----------+--------------+ IJV           Full                 Yes       Yes                   +----------+------------+----------+---------+-----------+--------------+ Subclavian    Full                 Yes       Yes                   +----------+------------+----------+---------+-----------+--------------+ Axillary      Full                           Yes                   +----------+------------+----------+---------+-----------+--------------+ Brachial      Full                           Yes                   +----------+------------+----------+---------+-----------+--------------+ Radial                                              Not visualized +----------+------------+----------+---------+-----------+--------------+ Ulnar                                               Not visualized +----------+------------+----------+---------+-----------+--------------+ Cephalic                                            Not visualized +----------+------------+----------+---------+-----------+--------------+ Basilic  Not visualized +----------+------------+----------+---------+-----------+--------------+  Summary:  Right: Findings consistent with acute deep vein thrombosis involving the right internal jugular veins, right subclavian veins and right axillary vein.  Left: This was a limited study.  *See table(s) above for measurements and observations.  Diagnosing physician: Deitra Mayo MD Electronically signed by Deitra Mayo MD on 06/24/2018 at 6:39:26 AM.    Final    [place] Ekg Site Rite  Result Date: 06/21/2018 If Site Rite image not attached, placement could not be confirmed due to current cardiac rhythm.   Micro Results     No results found for this or any previous visit (from the past 240 hour(s)).  Today   Subjective    [name]  today is non verbal   Objective   Blood pressure (!) 122/59, pulse (!) 54, temperature 97.9 F (36.6 C), resp. rate 18, height '5\' 2"'$  (1.575 m), weight 29.9 kg, SpO2 95 %.  No intake or output data in the 24 hours ending 06/25/18 8902  Exam  Extremely frail, elderly, cachectic African-American female, left IJ central line in place [place].AT,PERRAL Supple Neck,No JVD, No cervical lymphadenopathy appriciated.  Symmetrical Chest wall movement, Good air movement bilaterally, CTAB RRR,No Gallops, Rubs or new Murmurs, No Parasternal Heave +ve B.Sounds, Abd Soft, No tenderness, No organomegaly appriciated, No rebound - guarding or rigidity. No Cyanosis, Clubbing or edema, No new Rash or bruise    Data Review   CBC w Diff:  Lab Results  Component Value Date   WBC 6.6 06/25/2018   HGB 10.1 (L) 06/25/2018   HGB 13.0 12/04/2016   HCT 32.3 (L) 06/25/2018   HCT 39.2 12/04/2016   PLT 112 (L) 06/25/2018   PLT 180 12/04/2016   LYMPHOPCT 11 06/20/2018   LYMPHOPCT 29.0 12/04/2016   MONOPCT 5 06/20/2018   MONOPCT 6.7 12/04/2016   EOSPCT 0 06/20/2018   EOSPCT 0.8 12/04/2016   BASOPCT 0 06/20/2018   BASOPCT 1.6 12/04/2016    CMP:  Lab Results  Component Value Date   NA 141 06/25/2018   NA 145 12/04/2016   K 3.3 (L) 06/25/2018   K 3.9 12/04/2016   CL 110 06/25/2018   CL 107 12/08/2012   CO2 28 06/25/2018   CO2 30 (H) 12/04/2016   BUN 12 06/25/2018   BUN 18.1 12/04/2016   CREATININE 0.62 06/25/2018   CREATININE 0.8 12/04/2016   PROT 4.7 (L) 06/25/2018   PROT 6.6 12/04/2016   ALBUMIN 1.9 (L) 06/25/2018   ALBUMIN 3.9 12/04/2016   BILITOT 0.7 06/25/2018   BILITOT 0.89 12/04/2016   ALKPHOS 55 06/25/2018   ALKPHOS 65 12/04/2016   AST 20 06/25/2018   AST 17 12/04/2016   ALT 11 06/25/2018   ALT <6 12/04/2016  .   Total Time in preparing paper work, data evaluation and todays exam - 91 minutes  Lala Lund M.D on 06/25/2018 at 9:52 AM  [facility]   Office   [phone]

## 2018-06-25 NOTE — Discharge Instructions (Signed)
Disposition.  [facility]idential hospice °Condition.  Guarded °CODE STATUS.  DNR °Activity.  With assistance as tolerated, full fall precautions. °Diet.  Soft with feeding assistance and aspiration precautions. °Goal of care.  Comfort. ° °

## 2018-06-25 NOTE — Progress Notes (Signed)
[facility]  Confirmed with daughter Blair Promise that DME was delivered to home last night. Daughter getting linens for bed this morning and will notify hospital when ready.   Thank you.  Erling Conte, LCSW, [facility] are listed on [facility].

## 2018-06-25 NOTE — Progress Notes (Signed)
Called patient's daughter to inform of discharge. Called EMS for transport. IV team removed IJ without complications. Told daughter to leave dressing for 24 hours. Gave instructions to daughter over the phone about new medications. Removed Left forearm IV without complications. Paperwork given to EMS.

## 2018-06-26 ENCOUNTER — Telehealth: Payer: Self-pay | Admitting: Internal Medicine

## 2018-06-26 ENCOUNTER — Telehealth: Payer: Self-pay | Admitting: Diagnostic Neuroimaging

## 2018-06-26 DIAGNOSIS — G20 Parkinson's disease: Secondary | ICD-10-CM | POA: Diagnosis not present

## 2018-06-26 DIAGNOSIS — I679 Cerebrovascular disease, unspecified: Secondary | ICD-10-CM | POA: Diagnosis not present

## 2018-06-26 DIAGNOSIS — E039 Hypothyroidism, unspecified: Secondary | ICD-10-CM | POA: Diagnosis not present

## 2018-06-26 DIAGNOSIS — E43 Unspecified severe protein-calorie malnutrition: Secondary | ICD-10-CM | POA: Diagnosis not present

## 2018-06-26 DIAGNOSIS — F0280 Dementia in other diseases classified elsewhere without behavioral disturbance: Secondary | ICD-10-CM | POA: Diagnosis not present

## 2018-06-26 DIAGNOSIS — D649 Anemia, unspecified: Secondary | ICD-10-CM | POA: Diagnosis not present

## 2018-06-26 DIAGNOSIS — E860 Dehydration: Secondary | ICD-10-CM | POA: Diagnosis not present

## 2018-06-26 NOTE — Telephone Encounter (Signed)
Cheryl/daughter called to advise pt is in Hospice as of today so she will need FMLA forms updated. She will be faxing FMLA forms to medical records.  FYI

## 2018-06-26 NOTE — Telephone Encounter (Signed)
Noted  

## 2018-06-26 NOTE — Progress Notes (Signed)
Telephone call from patient stating she did not receive an RX for her Eliquis. AVS appears that it was printed. Spoke with Dr. Candiss Norse and he said to [facility] the [facility]. Called in to [facility].  Cathlean Marseilles Tamala Julian, MSN RN

## 2018-06-26 NOTE — Telephone Encounter (Signed)
Hospice has called requesting info for this morning in order to see patient at 10am.  I have confirmed with Dr. Ronnald Ramp that he would be the attending physician.  Hospice has been informed by Eda Paschal.

## 2018-06-26 NOTE — Telephone Encounter (Signed)
Copied from [place] [id number]. Topic: General - Other >> Jun 24, 2018  4:16 PM Yvette Rack wrote: Reason for CRM: Amber with Hospice and [place] stated pt will be discharged from [facility] and in to [facility] and they would like to know if Dr. Ronnald Ramp will be attending record for pt. Cb# [phone]

## 2018-06-27 DIAGNOSIS — G20 Parkinson's disease: Secondary | ICD-10-CM | POA: Diagnosis not present

## 2018-06-27 DIAGNOSIS — I679 Cerebrovascular disease, unspecified: Secondary | ICD-10-CM | POA: Diagnosis not present

## 2018-06-27 DIAGNOSIS — E860 Dehydration: Secondary | ICD-10-CM | POA: Diagnosis not present

## 2018-06-27 DIAGNOSIS — D649 Anemia, unspecified: Secondary | ICD-10-CM | POA: Diagnosis not present

## 2018-06-27 DIAGNOSIS — E43 Unspecified severe protein-calorie malnutrition: Secondary | ICD-10-CM | POA: Diagnosis not present

## 2018-06-27 DIAGNOSIS — F0280 Dementia in other diseases classified elsewhere without behavioral disturbance: Secondary | ICD-10-CM | POA: Diagnosis not present

## 2018-06-28 DIAGNOSIS — E43 Unspecified severe protein-calorie malnutrition: Secondary | ICD-10-CM | POA: Diagnosis not present

## 2018-06-28 DIAGNOSIS — I679 Cerebrovascular disease, unspecified: Secondary | ICD-10-CM | POA: Diagnosis not present

## 2018-06-28 DIAGNOSIS — F0280 Dementia in other diseases classified elsewhere without behavioral disturbance: Secondary | ICD-10-CM | POA: Diagnosis not present

## 2018-06-28 DIAGNOSIS — D649 Anemia, unspecified: Secondary | ICD-10-CM | POA: Diagnosis not present

## 2018-06-28 DIAGNOSIS — G20 Parkinson's disease: Secondary | ICD-10-CM | POA: Diagnosis not present

## 2018-06-28 DIAGNOSIS — E860 Dehydration: Secondary | ICD-10-CM | POA: Diagnosis not present

## 2018-06-30 DIAGNOSIS — E860 Dehydration: Secondary | ICD-10-CM | POA: Diagnosis not present

## 2018-06-30 DIAGNOSIS — E43 Unspecified severe protein-calorie malnutrition: Secondary | ICD-10-CM | POA: Diagnosis not present

## 2018-06-30 DIAGNOSIS — F0280 Dementia in other diseases classified elsewhere without behavioral disturbance: Secondary | ICD-10-CM | POA: Diagnosis not present

## 2018-06-30 DIAGNOSIS — D649 Anemia, unspecified: Secondary | ICD-10-CM | POA: Diagnosis not present

## 2018-06-30 DIAGNOSIS — G20 Parkinson's disease: Secondary | ICD-10-CM | POA: Diagnosis not present

## 2018-06-30 DIAGNOSIS — I679 Cerebrovascular disease, unspecified: Secondary | ICD-10-CM | POA: Diagnosis not present

## 2018-07-01 DIAGNOSIS — G20 Parkinson's disease: Secondary | ICD-10-CM | POA: Diagnosis not present

## 2018-07-01 DIAGNOSIS — F0280 Dementia in other diseases classified elsewhere without behavioral disturbance: Secondary | ICD-10-CM | POA: Diagnosis not present

## 2018-07-01 DIAGNOSIS — D649 Anemia, unspecified: Secondary | ICD-10-CM | POA: Diagnosis not present

## 2018-07-01 DIAGNOSIS — I679 Cerebrovascular disease, unspecified: Secondary | ICD-10-CM | POA: Diagnosis not present

## 2018-07-01 DIAGNOSIS — E43 Unspecified severe protein-calorie malnutrition: Secondary | ICD-10-CM | POA: Diagnosis not present

## 2018-07-01 DIAGNOSIS — E860 Dehydration: Secondary | ICD-10-CM | POA: Diagnosis not present

## 2018-07-02 DIAGNOSIS — E860 Dehydration: Secondary | ICD-10-CM | POA: Diagnosis not present

## 2018-07-02 DIAGNOSIS — G20 Parkinson's disease: Secondary | ICD-10-CM | POA: Diagnosis not present

## 2018-07-02 DIAGNOSIS — D649 Anemia, unspecified: Secondary | ICD-10-CM | POA: Diagnosis not present

## 2018-07-02 DIAGNOSIS — E43 Unspecified severe protein-calorie malnutrition: Secondary | ICD-10-CM | POA: Diagnosis not present

## 2018-07-02 DIAGNOSIS — F0280 Dementia in other diseases classified elsewhere without behavioral disturbance: Secondary | ICD-10-CM | POA: Diagnosis not present

## 2018-07-02 DIAGNOSIS — I679 Cerebrovascular disease, unspecified: Secondary | ICD-10-CM | POA: Diagnosis not present

## 2018-07-03 DIAGNOSIS — D649 Anemia, unspecified: Secondary | ICD-10-CM | POA: Diagnosis not present

## 2018-07-03 DIAGNOSIS — E43 Unspecified severe protein-calorie malnutrition: Secondary | ICD-10-CM | POA: Diagnosis not present

## 2018-07-03 DIAGNOSIS — F0280 Dementia in other diseases classified elsewhere without behavioral disturbance: Secondary | ICD-10-CM | POA: Diagnosis not present

## 2018-07-03 DIAGNOSIS — I679 Cerebrovascular disease, unspecified: Secondary | ICD-10-CM | POA: Diagnosis not present

## 2018-07-03 DIAGNOSIS — E860 Dehydration: Secondary | ICD-10-CM | POA: Diagnosis not present

## 2018-07-03 DIAGNOSIS — G20 Parkinson's disease: Secondary | ICD-10-CM | POA: Diagnosis not present

## 2018-07-03 NOTE — Telephone Encounter (Signed)
I spoke to daughter, [name] and pt is under attending of [name] pcp, Dr. Scarlette Calico.  Consulted Dr. Leta Baptist and since pcp attending under hospice would have him do FMLA .  I will fax to them for her after speaking to [name] Eads, daughter.  Fax confirmation received [phone] (FMLA forms).   Our form to MR.

## 2018-07-04 DIAGNOSIS — F0280 Dementia in other diseases classified elsewhere without behavioral disturbance: Secondary | ICD-10-CM | POA: Diagnosis not present

## 2018-07-04 DIAGNOSIS — D649 Anemia, unspecified: Secondary | ICD-10-CM | POA: Diagnosis not present

## 2018-07-04 DIAGNOSIS — E860 Dehydration: Secondary | ICD-10-CM | POA: Diagnosis not present

## 2018-07-04 DIAGNOSIS — E43 Unspecified severe protein-calorie malnutrition: Secondary | ICD-10-CM | POA: Diagnosis not present

## 2018-07-04 DIAGNOSIS — I679 Cerebrovascular disease, unspecified: Secondary | ICD-10-CM | POA: Diagnosis not present

## 2018-07-04 DIAGNOSIS — G20 Parkinson's disease: Secondary | ICD-10-CM | POA: Diagnosis not present

## 2018-07-05 DIAGNOSIS — G20 Parkinson's disease: Secondary | ICD-10-CM | POA: Diagnosis not present

## 2018-07-05 DIAGNOSIS — E860 Dehydration: Secondary | ICD-10-CM | POA: Diagnosis not present

## 2018-07-05 DIAGNOSIS — E43 Unspecified severe protein-calorie malnutrition: Secondary | ICD-10-CM | POA: Diagnosis not present

## 2018-07-05 DIAGNOSIS — F0280 Dementia in other diseases classified elsewhere without behavioral disturbance: Secondary | ICD-10-CM | POA: Diagnosis not present

## 2018-07-05 DIAGNOSIS — I679 Cerebrovascular disease, unspecified: Secondary | ICD-10-CM | POA: Diagnosis not present

## 2018-07-05 DIAGNOSIS — D649 Anemia, unspecified: Secondary | ICD-10-CM | POA: Diagnosis not present

## 2018-07-07 ENCOUNTER — Telehealth: Payer: Self-pay | Admitting: Internal Medicine

## 2018-07-07 NOTE — Telephone Encounter (Signed)
07/07/18 I received D/C from [place] sent to [place] in [facility].  07/09/2018 I received D/C back from Dr,Jones I called [facility] home and spoke to [place] to have them pick up. PWR

## 2018-07-09 NOTE — Telephone Encounter (Signed)
FMLA has been completed & signed, Fax to [phone], Copy sent to scan.  Original mailed to daughter to [place] and charged for under her.

## 2018-07-26 DEATH — deceased

## 2019-03-03 ENCOUNTER — Ambulatory Visit: Payer: Medicare Other | Admitting: Diagnostic Neuroimaging

## 2020-03-06 IMAGING — CT CT HEAD W/O CM
3 series · 15 of 47 positions shown, 18 images · non-contrast
Comparison: CT brain of 05/29/2017 and CT brain of 12/16/2015

CLINICAL DATA: Altered level of consciousness, confusion

EXAM:
CT HEAD WITHOUT CONTRAST
TECHNIQUE: Contiguous axial images were obtained from the base of the skull
through the vertex without intravenous contrast.

[Series 3: head 5.0 h30s · axial · 0.42mm/px · z∈[-142,-2]mm · 9 of 34 slices shown, 12 images]
[im 3/34  brain]
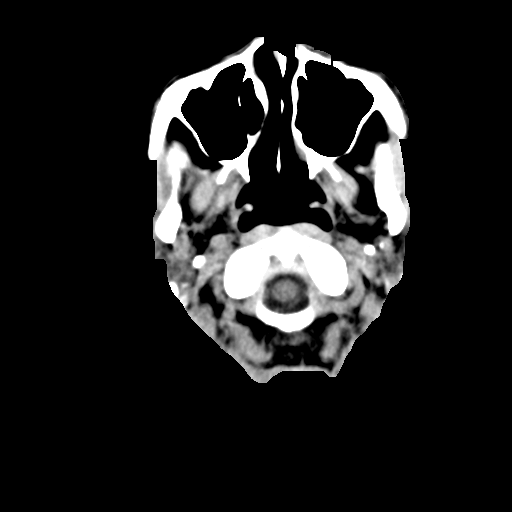
[im 3/34  bone]
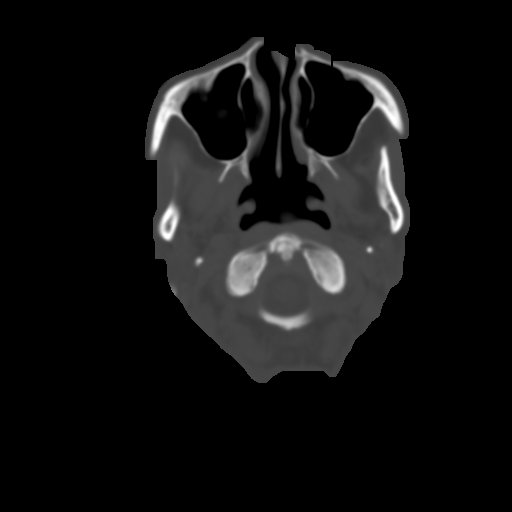
[im 6/34  brain]
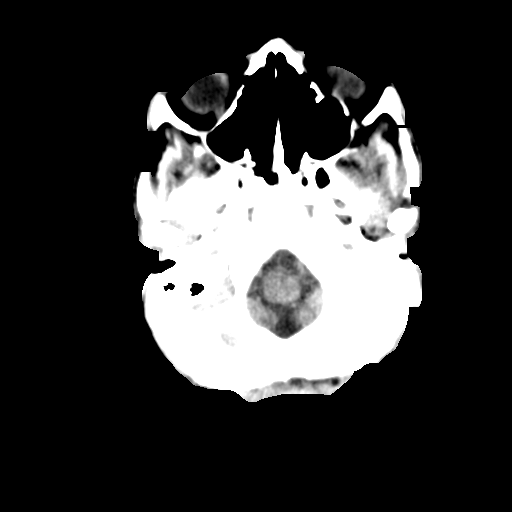
[im 10/34  brain]
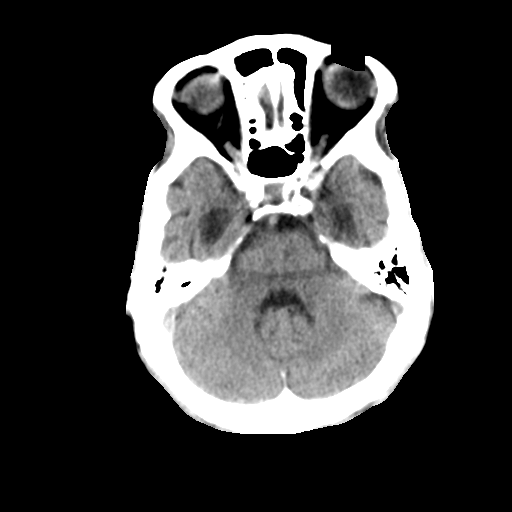
[im 13/34  brain]
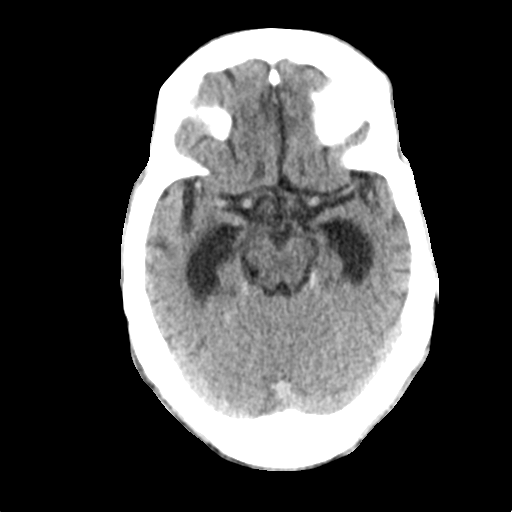
[im 18/34  brain]
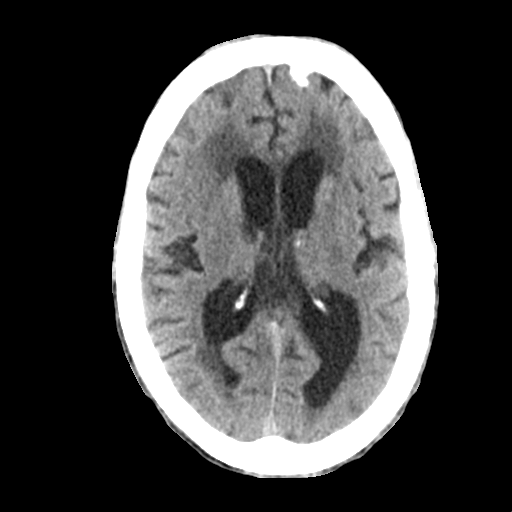
[im 18/34  bone]
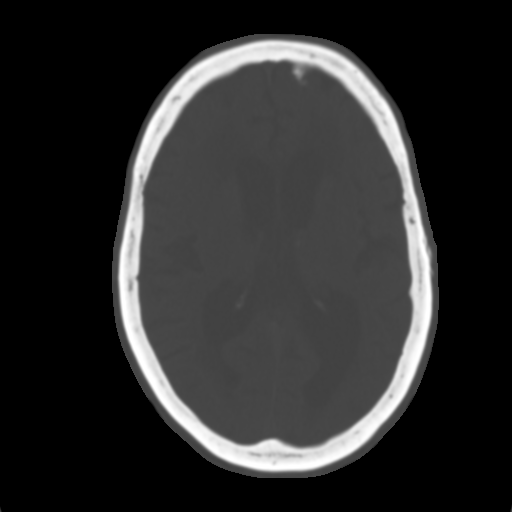
[im 21/34  brain]
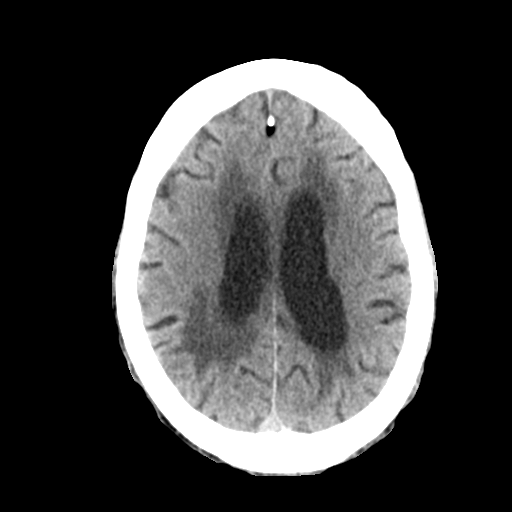
[im 24/34  brain]
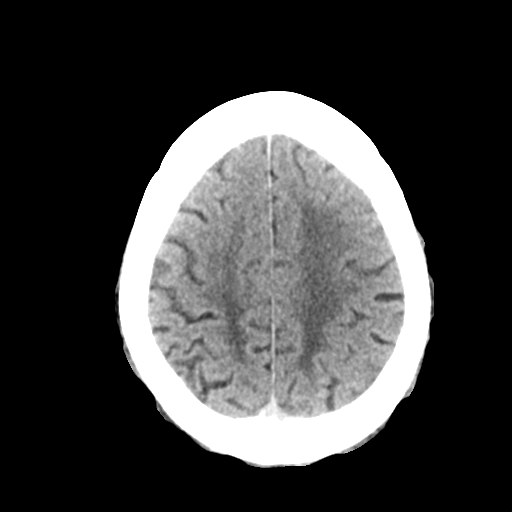
[im 28/34  brain]
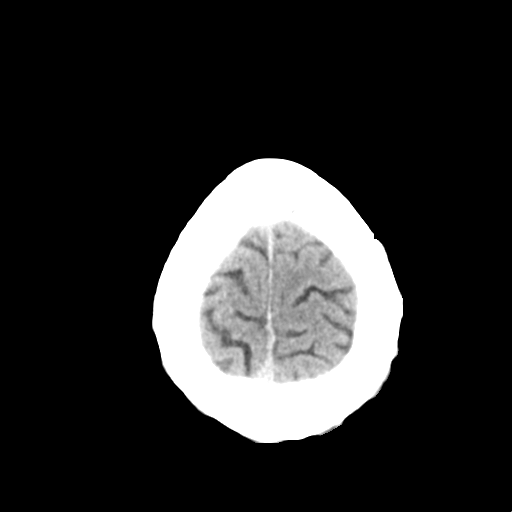
[im 31/34  brain]
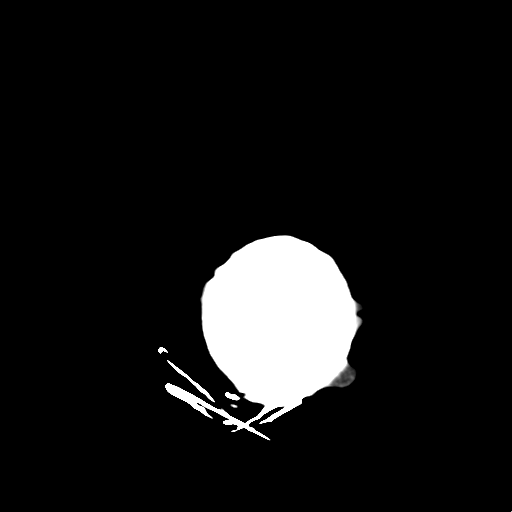
[im 31/34  bone]
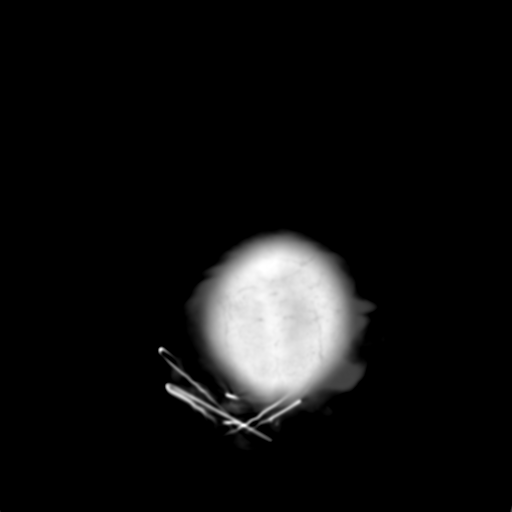

[Series 5: head 3.0 mpr cor · coronal · 0.33mm/px · 3 of 71 slices shown]
[im 24/71  brain]
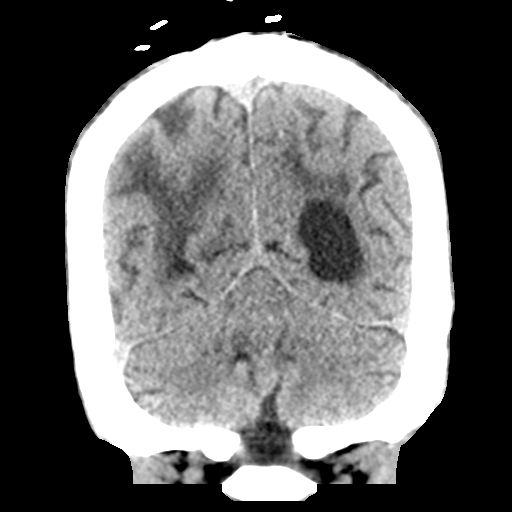
[im 32/71  brain]
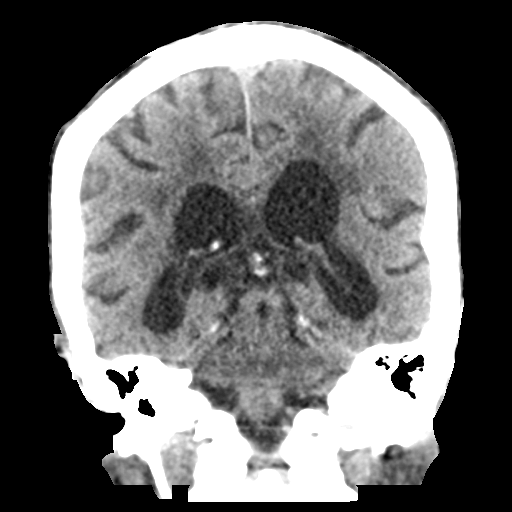
[im 39/71  brain]
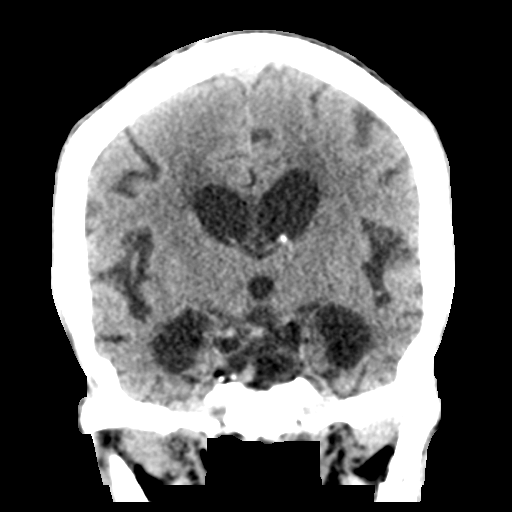

[Series 6: head 3.0 mpr sag · sagittal · 0.33mm/px · 3 of 53 slices shown]
[im 18/53  brain]
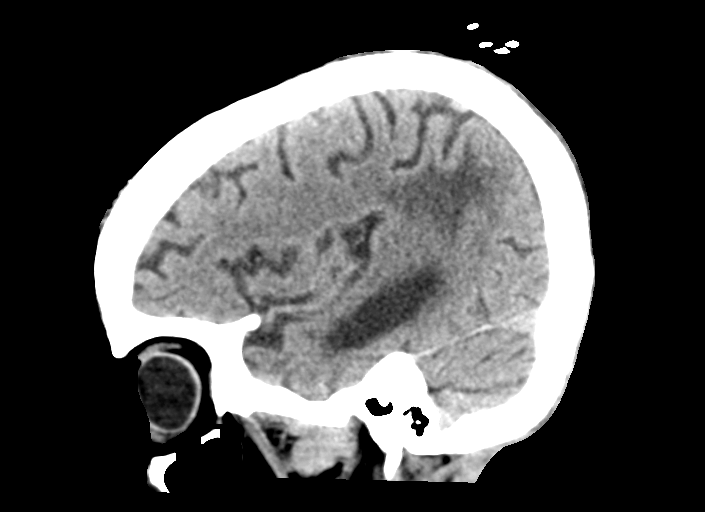
[im 27/53  brain]
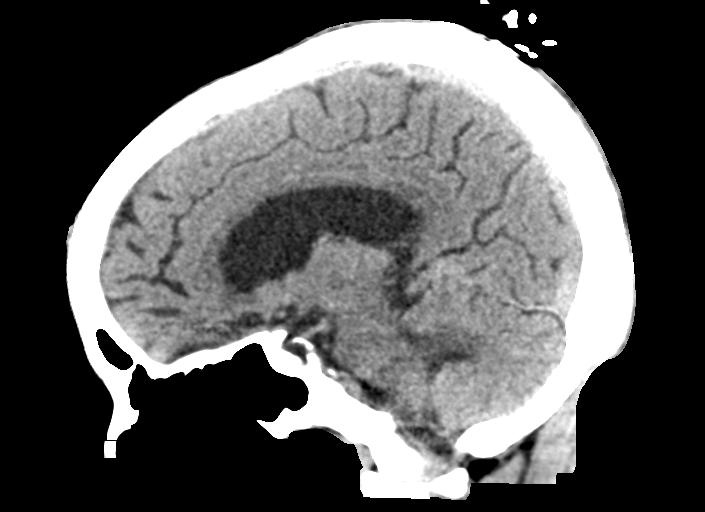
[im 35/53  brain]
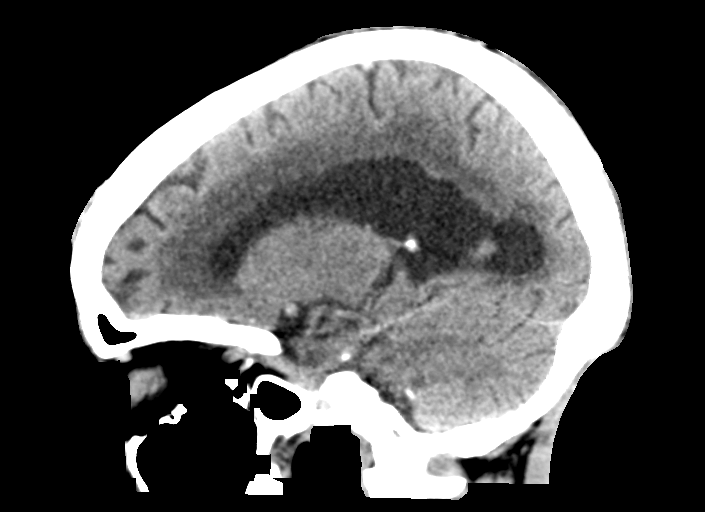

[15 of 47 positions shown; findings below may reference images not displayed]

FINDINGS: Brain: Ventricular dilatation is unchanged compared to the prior CT
as are the prominent cortical sulci, consistent with diffuse
atrophy. The septum is in normal midline position. Moderate small
vessel ischemic changes again noted throughout the periventricular
white matter. No hemorrhage, mass lesion, or acute infarction is
seen.

Vascular: No vascular abnormality is noted on this unenhanced study.

Skull: On bone window images, probable old calcified meningioma is
present in the left frontal region and is stable. No acute calvarial
abnormality is seen.

Sinuses/Orbits: The paranasal sinuses appear well pneumatized. Some
extrusion of fat is noted within the right maxillary sinus from the
inferior right orbit which was present previously and may be due to
prior trauma.

Other: None.
IMPRESSION: 1. Stable atrophy and moderately severe small vessel ischemic
change. No acute intracranial abnormality.
2. Some extrusion of fat is noted from the inferior right lobe into
the right maxillary sinus most likely due to prior trauma. This is
unchanged compared to the prior CT of 12/16/2015.
